# Patient Record
Sex: Male | Born: 1935 | Race: White | Hispanic: No | Smoking: Former smoker
Health system: Southern US, Community
[De-identification: ages and names within clinical notes are randomized; demographics above are authoritative.]

## PROBLEM LIST (undated history)

## (undated) DIAGNOSIS — R059 Cough, unspecified: Secondary | ICD-10-CM

## (undated) DIAGNOSIS — I071 Rheumatic tricuspid insufficiency: Secondary | ICD-10-CM

## (undated) DIAGNOSIS — Z87898 Personal history of other specified conditions: Secondary | ICD-10-CM

## (undated) DIAGNOSIS — Z9289 Personal history of other medical treatment: Secondary | ICD-10-CM

## (undated) DIAGNOSIS — D649 Anemia, unspecified: Secondary | ICD-10-CM

## (undated) DIAGNOSIS — R05 Cough: Secondary | ICD-10-CM

## (undated) DIAGNOSIS — I351 Nonrheumatic aortic (valve) insufficiency: Secondary | ICD-10-CM

## (undated) DIAGNOSIS — M1990 Unspecified osteoarthritis, unspecified site: Secondary | ICD-10-CM

## (undated) DIAGNOSIS — G14 Postpolio syndrome: Secondary | ICD-10-CM

## (undated) DIAGNOSIS — K4090 Unilateral inguinal hernia, without obstruction or gangrene, not specified as recurrent: Secondary | ICD-10-CM

## (undated) DIAGNOSIS — N189 Chronic kidney disease, unspecified: Secondary | ICD-10-CM

## (undated) DIAGNOSIS — J189 Pneumonia, unspecified organism: Secondary | ICD-10-CM

## (undated) DIAGNOSIS — E785 Hyperlipidemia, unspecified: Secondary | ICD-10-CM

## (undated) DIAGNOSIS — I10 Essential (primary) hypertension: Secondary | ICD-10-CM

## (undated) DIAGNOSIS — M7989 Other specified soft tissue disorders: Secondary | ICD-10-CM

## (undated) DIAGNOSIS — R569 Unspecified convulsions: Secondary | ICD-10-CM

## (undated) DIAGNOSIS — Z8719 Personal history of other diseases of the digestive system: Secondary | ICD-10-CM

## (undated) DIAGNOSIS — I619 Nontraumatic intracerebral hemorrhage, unspecified: Secondary | ICD-10-CM

## (undated) DIAGNOSIS — D699 Hemorrhagic condition, unspecified: Secondary | ICD-10-CM

## (undated) DIAGNOSIS — I340 Nonrheumatic mitral (valve) insufficiency: Secondary | ICD-10-CM

## (undated) DIAGNOSIS — R011 Cardiac murmur, unspecified: Secondary | ICD-10-CM

## (undated) HISTORY — DX: Nonrheumatic aortic (valve) insufficiency: I35.1

## (undated) HISTORY — DX: Personal history of other medical treatment: Z92.89

## (undated) HISTORY — DX: Essential (primary) hypertension: I10

## (undated) HISTORY — DX: Personal history of other specified conditions: Z87.898

## (undated) HISTORY — DX: Hyperlipidemia, unspecified: E78.5

## (undated) HISTORY — DX: Rheumatic tricuspid insufficiency: I07.1

## (undated) HISTORY — PX: DENTAL SURGERY: SHX609

## (undated) HISTORY — DX: Chronic kidney disease, unspecified: N18.9

## (undated) HISTORY — DX: Unspecified convulsions: R56.9

## (undated) HISTORY — DX: Nontraumatic intracerebral hemorrhage, unspecified: I61.9

## (undated) HISTORY — PX: OTHER SURGICAL HISTORY: SHX169

## (undated) HISTORY — DX: Nonrheumatic mitral (valve) insufficiency: I34.0

---

## 1999-12-27 ENCOUNTER — Encounter (INDEPENDENT_AMBULATORY_CARE_PROVIDER_SITE_OTHER): Payer: Self-pay | Admitting: *Deleted

## 1999-12-27 ENCOUNTER — Ambulatory Visit (HOSPITAL_COMMUNITY): Admission: RE | Admit: 1999-12-27 | Discharge: 1999-12-27 | Payer: Self-pay | Admitting: *Deleted

## 2003-02-14 ENCOUNTER — Ambulatory Visit (HOSPITAL_COMMUNITY): Admission: RE | Admit: 2003-02-14 | Discharge: 2003-02-14 | Payer: Self-pay | Admitting: *Deleted

## 2005-07-01 ENCOUNTER — Encounter: Admission: RE | Admit: 2005-07-01 | Discharge: 2005-07-01 | Payer: Self-pay | Admitting: Specialist

## 2008-02-29 DIAGNOSIS — Z9289 Personal history of other medical treatment: Secondary | ICD-10-CM

## 2008-02-29 HISTORY — DX: Personal history of other medical treatment: Z92.89

## 2008-09-17 ENCOUNTER — Encounter: Admission: RE | Admit: 2008-09-17 | Discharge: 2008-09-17 | Payer: Self-pay | Admitting: Endocrinology

## 2008-09-18 ENCOUNTER — Encounter: Admission: RE | Admit: 2008-09-18 | Discharge: 2008-09-18 | Payer: Self-pay | Admitting: Endocrinology

## 2010-02-28 DIAGNOSIS — Z9289 Personal history of other medical treatment: Secondary | ICD-10-CM

## 2010-02-28 HISTORY — DX: Personal history of other medical treatment: Z92.89

## 2010-10-13 ENCOUNTER — Emergency Department (HOSPITAL_COMMUNITY): Payer: Medicare Other

## 2010-10-13 ENCOUNTER — Emergency Department (HOSPITAL_COMMUNITY)
Admission: EM | Admit: 2010-10-13 | Discharge: 2010-10-13 | Disposition: A | Discharge status: Home / Self Care | Payer: Medicare Other | Attending: Emergency Medicine | Admitting: Emergency Medicine

## 2010-10-13 DIAGNOSIS — Z79899 Other long term (current) drug therapy: Secondary | ICD-10-CM | POA: Insufficient documentation

## 2010-10-13 DIAGNOSIS — S01502A Unspecified open wound of oral cavity, initial encounter: Secondary | ICD-10-CM | POA: Insufficient documentation

## 2010-10-13 DIAGNOSIS — W503XXA Accidental bite by another person, initial encounter: Secondary | ICD-10-CM | POA: Insufficient documentation

## 2010-10-13 DIAGNOSIS — Y92009 Unspecified place in unspecified non-institutional (private) residence as the place of occurrence of the external cause: Secondary | ICD-10-CM | POA: Insufficient documentation

## 2010-10-13 DIAGNOSIS — IMO0002 Reserved for concepts with insufficient information to code with codable children: Secondary | ICD-10-CM | POA: Insufficient documentation

## 2010-10-13 DIAGNOSIS — R296 Repeated falls: Secondary | ICD-10-CM | POA: Insufficient documentation

## 2010-10-13 DIAGNOSIS — S0100XA Unspecified open wound of scalp, initial encounter: Secondary | ICD-10-CM | POA: Insufficient documentation

## 2010-10-13 DIAGNOSIS — R4182 Altered mental status, unspecified: Secondary | ICD-10-CM | POA: Insufficient documentation

## 2010-10-13 DIAGNOSIS — E785 Hyperlipidemia, unspecified: Secondary | ICD-10-CM | POA: Insufficient documentation

## 2010-10-13 DIAGNOSIS — I10 Essential (primary) hypertension: Secondary | ICD-10-CM | POA: Insufficient documentation

## 2010-10-13 DIAGNOSIS — G40909 Epilepsy, unspecified, not intractable, without status epilepticus: Secondary | ICD-10-CM | POA: Insufficient documentation

## 2010-10-13 LAB — COMPREHENSIVE METABOLIC PANEL
Alkaline Phosphatase: 119 U/L — ABNORMAL HIGH (ref 39–117)
BUN: 21 mg/dL (ref 6–23)
GFR calc Af Amer: >60 mL/min (ref >60)
Potassium: 4.3 mEq/L (ref 3.5–5.1)
Sodium: 137 mEq/L (ref 135–145)

## 2010-10-13 LAB — URINALYSIS, ROUTINE W REFLEX MICROSCOPIC
Leukocytes, UA: NEGATIVE
pH: 5.5 (ref 5.0–8.0)

## 2010-10-13 LAB — ETHANOL: Alcohol, Ethyl (B): <11 mg/dL (ref 0–11)

## 2010-10-13 LAB — DIFFERENTIAL
Basophils Absolute: 0 10*3/uL (ref 0.0–0.1)
Eosinophils Absolute: 0.2 10*3/uL (ref 0.0–0.7)
Eosinophils Relative: 1 % (ref 0–5)
Lymphocytes Relative: 14 % (ref 12–46)
Neutro Abs: 11.5 10*3/uL — ABNORMAL HIGH (ref 1.7–7.7)
Neutrophils Relative %: 77 % (ref 43–77)

## 2010-10-13 LAB — CBC
HCT: 43.9 % (ref 39.0–52.0)
Hemoglobin: 15.6 g/dL (ref 13.0–17.0)
MCH: 30.1 pg (ref 26.0–34.0)
Platelets: 195 10*3/uL (ref 150–400)
RBC: 5.18 MIL/uL (ref 4.22–5.81)
WBC: 14.9 10*3/uL — ABNORMAL HIGH (ref 4.0–10.5)

## 2010-10-13 LAB — RAPID URINE DRUG SCREEN, HOSP PERFORMED
Amphetamines: NOT DETECTED
Benzodiazepines: NOT DETECTED
Cocaine: NOT DETECTED
Opiates: NOT DETECTED

## 2010-10-13 LAB — URINE MICROSCOPIC-ADD ON

## 2010-10-13 LAB — POCT I-STAT TROPONIN I

## 2012-05-29 ENCOUNTER — Encounter: Payer: Self-pay | Admitting: Diagnostic Neuroimaging

## 2012-05-29 ENCOUNTER — Ambulatory Visit (INDEPENDENT_AMBULATORY_CARE_PROVIDER_SITE_OTHER): Payer: Medicare Other | Admitting: Diagnostic Neuroimaging

## 2012-05-29 VITALS — BP 120/71 | HR 79 | Temp 98.1°F | Ht 67.0 in | Wt 164.0 lb

## 2012-05-29 DIAGNOSIS — G40909 Epilepsy, unspecified, not intractable, without status epilepticus: Secondary | ICD-10-CM

## 2012-05-29 MED ORDER — LEVETIRACETAM 1000 MG PO TABS: 1000.0000 mg | ORAL_TABLET | Freq: Two times a day (BID) | ORAL | Status: DC

## 2012-05-29 NOTE — Patient Instructions (Signed)
Continue antiseizure medications.

## 2012-05-29 NOTE — Progress Notes (Signed)
[facility]  PATIENT: Charles Randall. DOB: 03/09/1927  REFERRING CLINICIAN:  HISTORY FROM: patient REASON FOR VISIT: follow up   HISTORICAL  CHIEF COMPLAINT:  Chief Complaint  Patient presents with  . Seizures    HISTORY OF PRESENT ILLNESS:   UPDATE 05/29/12: Doing well. No seizures. Tolerating medications.  UPDATE 11/29/11:  Has not had any seizure episodes.  Tolerating LEV 1000mg  BID, tolerating well without mood changes or increased dizziness.  No complaints today.  He has been driving when he absolutely has no other way.    UPDATE 08/29/11:  He has had 2 seizures on 08/11/11.  Were similar to his previous seizures.  Denies bowel/bladder incontinence. Denies sleep changes, missed doses, fever or poor food/fluid  intake.  He was increased to 1000mg  BID, tolerating well without dizziness or mood changes.    UPDATE 10/18/10: Had another spell on 10/13/10.  Woke up normally, read paper.  Then around 9am, was found by girlfried, slumped against wall in bedroom, with blood from mouth.  Taken to ER by EMS.  He was somewhat combative and confused for 2-3 hours.   CT head and neck unremarkable.  Started on LEV 500mg  BID.  No further events.  Family reports poor sleep patterns, snoring and daytime sleepiness.  Sleeps 5 hrs per night, tossing and turning.  Able to fall asleep very easily.  UPDATE 06/30/10: No further events.  Last spell 03/15/10.  Not driving.  Feels good.  Testing reviewed. PRIOR HPI: 79 yo right-handed Caucasian male with a past medical history of HTN, hyperlipidemia, post-polio syndrome, vitamin D deficiency, osteoporosis with history of compression fracture, left inguinal hernia, and eczema who presents for evaluation for possible atypical seizures. He is accompanied today by his long-term girlfriend who helps provide details regarding these episodes.   Starting about 2-3 years ago and occurring about every 6 months, the patient has had spells where he will  "blank out" for about 20-30 minutes. He reports complete amnesia during the spells, but denies any other symptoms occurring during or after the episodes. Other than a possible "warning surge" in his head immediately prior to the onset of the episodes, he denies any symptoms preceding the episodes. He has a hard time describing what the "warning surge" is. His girlfriend has noticed that during the episodes he has a "blank stare," "his face gets really white," and he will ask many questions over and over such as: "Where are we?" and "What are we doing here?" She denies any other signs, including loss of postural control, loss of bowel or bladder continence, orofacial twitching, tongue biting, etc. Immediately following the episodes, the patient is back to his normal self.  08/2008 to 09/2008: MRI brain w/ and w/out, MRA w/out, carotid dopplers, EEG, and neurocognitive battery all unrevealing. 10-hour ambulatory EEG on 12/2009 which was negative.   Most recent episode: March 15, 2010 around 12:30 PM, and was different from previous four episodes. The girlfriend found him "slumped down" in a chair with his legs and arms "as stiff as a board" (arms flexed at elbow, legs flexed at knees, eyes closed). She was able to rouse him after about 3-4 minutes, and he reports being "as weak as water" afterward, as well as very tired, which led him to sleep for the rest of the day.   REVIEW OF SYSTEMS: Full 14 system review of systems performed and notable only for fatigue, swelling in legs, ringing in the ears, itching, easy bruising and bleeding,  joint pain, weakness, history of seizure, decreased energy.  ALLERGIES: No Known Allergies  HOME MEDICATIONS: No outpatient prescriptions prior to visit.   No facility-administered medications prior to visit.    PAST MEDICAL HISTORY: History reviewed. No pertinent past medical history.  PAST SURGICAL HISTORY: History reviewed. No pertinent past surgical  history.  FAMILY HISTORY: No family history on file.  SOCIAL HISTORY:  History   Social History  . Marital Status: Unknown    Spouse Name: N/A    Number of Children: 2  . Years of Education: BA [employer]   Occupational History  . Not on file.   Social History Main Topics  . Smoking status: Former Smoker -- 0.50 packs/day    Quit date: 03/01/1963  . Smokeless tobacco: Not on file  . Alcohol Use: No  . Drug Use: No  . Sexually Active: Not on file   Other Topics Concern  . Not on file   Social History Narrative   79 year old right-handed male who lives at home alone.  He is single (has long-term girlfriend) and has two children.    He drinks one cup caffeinated coffee daily.     PHYSICAL EXAM  Filed Vitals:   05/29/12 1455  BP: 120/71  Pulse: 79  Temp: 98.1 F (36.7 C)  TempSrc: Oral  Height: 5\' 7"  (1.702 m)  Weight: 164 lb (74.39 kg)   Body mass index is 25.68 kg/(m^2).  GENERAL EXAM: Patient is in no distress  CARDIOVASCULAR: Regular rate and rhythm, no murmurs, no carotid bruits  NEUROLOGIC: MENTAL STATUS: awake, alert, language fluent, comprehension intact, naming intact CRANIAL NERVE: no papilledema on fundoscopic exam, pupils equal and reactive to light, visual fields full to confrontation, extraocular muscles intact, no nystagmus, facial sensation and strength symmetric, uvula midline, shoulder shrug symmetric, tongue midline. MOTOR: normal bulk and tone, full strength in the BUE, BLE; EXCEPT BILATERAL FOOT DF 3/5.  SENSORY: normal and symmetric to light touch, pinprick, temperature, vibration and proprioception COORDINATION: finger-nose-finger, fine finger movements normal REFLEXES: BUE 2+, BLE ABSENT. GAIT/STATION: CANNOT WALK ON HEELS OR TOES (FOOT DROP RELATED TO POLIO SYNDROME)  DIAGNOSTIC DATA (LABS, IMAGING, TESTING) - I reviewed patient records, labs, notes, testing and imaging myself where available.  No results found for this basename: WBC,  HGB, HCT, MCV, PLT   No results found for this basename: na, k, cl, co2, glucose, bun, creatinine, calcium, prot, albumin, ast, alt, alkphos, bilitot, gfrnonaa, gfraa   No results found for this basename: CHOL, HDL, LDLCALC, LDLDIRECT, TRIG, CHOLHDL   No results found for this basename: HGBA1C   No results found for this basename: VITAMINB12   No results found for this basename: TSH   05/04/10 EEG - normal  05/11/10 MRI BRAIN - mild chronic small vessel ischemic disease.  ASSESSMENT AND PLAN  79 y.o. year old male  has no past medical history on file. here with seizure disorder. Stable. Continue current medications.   Suanne Marker, MD 05/29/2012, 3:19 PM Certified in Neurology, Neurophysiology and Neuroimaging  [facility] [address] [place], [place] [address] [phone]

## 2014-09-17 ENCOUNTER — Encounter: Payer: Self-pay | Admitting: *Deleted

## 2014-10-28 ENCOUNTER — Encounter: Payer: Self-pay | Admitting: Cardiovascular Disease

## 2015-03-03 ENCOUNTER — Telehealth: Payer: Self-pay | Admitting: Cardiovascular Disease

## 2015-03-03 NOTE — Telephone Encounter (Signed)
Received records from [facility] for appointment on 04/08/15 with Dr Sallyanne Kuster.  Records given to [facility] (medical records) for Dr Croitoru's schedule on 04/08/15. lp

## 2015-04-06 ENCOUNTER — Ambulatory Visit: Payer: Medicare Other | Admitting: Cardiovascular Disease

## 2015-04-07 DIAGNOSIS — G14 Postpolio syndrome: Secondary | ICD-10-CM | POA: Insufficient documentation

## 2015-04-07 DIAGNOSIS — R404 Transient alteration of awareness: Secondary | ICD-10-CM | POA: Insufficient documentation

## 2015-04-07 DIAGNOSIS — E782 Mixed hyperlipidemia: Secondary | ICD-10-CM | POA: Insufficient documentation

## 2015-04-07 DIAGNOSIS — M79645 Pain in left finger(s): Secondary | ICD-10-CM | POA: Insufficient documentation

## 2015-04-07 DIAGNOSIS — G40109 Localization-related (focal) (partial) symptomatic epilepsy and epileptic syndromes with simple partial seizures, not intractable, without status epilepticus: Secondary | ICD-10-CM | POA: Insufficient documentation

## 2015-04-07 DIAGNOSIS — L989 Disorder of the skin and subcutaneous tissue, unspecified: Secondary | ICD-10-CM | POA: Insufficient documentation

## 2015-04-07 DIAGNOSIS — R413 Other amnesia: Secondary | ICD-10-CM | POA: Insufficient documentation

## 2015-04-08 ENCOUNTER — Ambulatory Visit (INDEPENDENT_AMBULATORY_CARE_PROVIDER_SITE_OTHER): Payer: Medicare Other | Admitting: Cardiovascular Disease

## 2015-04-08 VITALS — BP 134/82 | HR 66 | Ht 67.0 in | Wt 150.0 lb

## 2015-04-08 DIAGNOSIS — J90 Pleural effusion, not elsewhere classified: Secondary | ICD-10-CM

## 2015-04-08 DIAGNOSIS — R079 Chest pain, unspecified: Secondary | ICD-10-CM

## 2015-04-08 DIAGNOSIS — I10 Essential (primary) hypertension: Secondary | ICD-10-CM

## 2015-04-08 DIAGNOSIS — R600 Localized edema: Secondary | ICD-10-CM | POA: Diagnosis not present

## 2015-04-08 DIAGNOSIS — Z79899 Other long term (current) drug therapy: Secondary | ICD-10-CM | POA: Diagnosis not present

## 2015-04-08 DIAGNOSIS — J948 Other specified pleural conditions: Secondary | ICD-10-CM

## 2015-04-08 LAB — BASIC METABOLIC PANEL
BUN: 28 mg/dL — AB (ref 7–25)
CHLORIDE: 102 mmol/L (ref 98–110)
CO2: 27 mmol/L (ref 20–31)
CREATININE: 1.45 mg/dL — AB (ref 0.70–1.11)
Calcium: 9.3 mg/dL (ref 8.6–10.3)
Glucose, Bld: 78 mg/dL (ref 65–99)
Potassium: 5.1 mmol/L (ref 3.5–5.3)
Sodium: 137 mmol/L (ref 135–146)

## 2015-04-08 MED ORDER — LOSARTAN POTASSIUM 100 MG PO TABS: 100.0000 mg | ORAL_TABLET | Freq: Every day | ORAL | Status: DC

## 2015-04-08 NOTE — Patient Instructions (Addendum)
Your physician has requested that you have an echocardiogram. Echocardiography is a painless test that uses sound waves to create images of your heart. It provides your doctor with information about the size and shape of your heart and how well your heart's chambers and valves are working. This procedure takes approximately one hour. There are no restrictions for this procedure.  Your physician recommends that you return for lab work in: [place] has recommended you make the following change in your medication:   STOP AMLODIPINE  INCREASE  LOSARTAN TO 100 MG TAKE ONE TABLET DAILY  Dr. Sallyanne Kuster recommends that you schedule a follow-up appointment in: [place]

## 2015-04-09 ENCOUNTER — Encounter: Payer: Self-pay | Admitting: Cardiovascular Disease

## 2015-04-09 DIAGNOSIS — I10 Essential (primary) hypertension: Secondary | ICD-10-CM | POA: Insufficient documentation

## 2015-04-09 DIAGNOSIS — J90 Pleural effusion, not elsewhere classified: Secondary | ICD-10-CM | POA: Insufficient documentation

## 2015-04-09 DIAGNOSIS — R600 Localized edema: Secondary | ICD-10-CM | POA: Insufficient documentation

## 2015-04-09 LAB — BRAIN NATRIURETIC PEPTIDE: Brain Natriuretic Peptide: 47.5 pg/mL (ref <100)

## 2015-04-09 NOTE — Progress Notes (Signed)
Patient ID: Charles Randall., male   DOB: March 13, 1927, 82 y.o.   MRN: [id number]    Cardiology Office Note    Date:  04/09/2015   ID:  Charles Big., DOB 1927/03/25, MRN [id number]  PCP:  Lynne Logan, MD  Cardiologist:   Sanda Klein, MD   Chief Complaint  Patient presents with  . New Evaluation    referred by Dewayne Shorter, MD//pt c/o SOB on overexertion, loss of balance, swelling in bilateral legs/feet/ankles--more in left  . Seizures    last one was about 3 weeks ago--usually has them every 6-10 months    History of Present Illness:  Charles Happ. is a 82 y.o. male with a long-standing history of hypertension who I previously saw in April 2014 for evaluation of an episode of loss of consciousness that was identified eventually as being a seizure. He has long-standing systemic hypertension but not much in the way of structural heart disease. In 2014 his echocardiogram showed normal left ventricular systolic function and mild to moderate aortic insufficiency.  He returns with complaints of bilateral lower extremity edema that occurs intermittently. Although both ankles are both the left ankle is always a little more swollen. Also has substantial asymmetry of his lower extremities due to remote polio syndrome. There is no associated pain or discoloration. Says been going on for about a year off and on.  About one month ago he had right-sided pleuritic pain that lasted for about 2 weeks. The discomfort was clearly worsened by deep breathing and it caused splinting.  Associated cough productive of scanty amounts of mucus but he did not have fever, chills or hemoptysis. The chest x-ray reportedly showed a small pleural effusion. His images are not available for my review.  While he does describe some exertional shortness of breath this only occurs with fairly intense physical activity.   Past Medical History  Diagnosis Date  . H/O echocardiogram 2013    aortic valve  disorder, EF =>55%  . H/O echocardiogram 2012    AVD, EF =>55%  . H/O Doppler ultrasound 2010  . Seizures   . Aortic insufficiency     mild, moderate  . HTN (hypertension)   . Dyslipidemia   . Mitral regurgitation     mild  . Tricuspid regurgitation     mild  . H/O dizziness   . Lightheadedness   . Ankle edema     mild    Past Surgical History  Procedure Laterality Date  . None      Outpatient Prescriptions Prior to Visit  Medication Sig Dispense Refill  . Cholecalciferol (VITAMIN D-3) 1000 UNITS CAPS Take by mouth.    . levETIRAcetam (KEPPRA) 1000 MG tablet Take 1 tablet (1,000 mg total) by mouth 2 (two) times daily. 60 tablet 12  . MAGNESIUM-ZINC PO Take by mouth.    . triamterene-hydrochlorothiazide (MAXZIDE-25) 37.5-25 MG per tablet     . vitamin B-12 (CYANOCOBALAMIN) 1000 MCG tablet Take 1,000 mcg by mouth daily.    Marland Kitchen amLODipine (NORVASC) 5 MG tablet     . atorvastatin (LIPITOR) 10 MG tablet     . vitamin C (ASCORBIC ACID) 500 MG tablet Take 500 mg by mouth daily.     No facility-administered medications prior to visit.     Allergies:   Review of patient's allergies indicates no known allergies.   Social History   Social History  . Marital Status: Unknown    Spouse Name: N/A  . Number of  Children: 2  . Years of Education: BA [employer]   Social History Main Topics  . Smoking status: Former Smoker -- 0.50 packs/day    Quit date: 03/01/1963  . Smokeless tobacco: Not on file  . Alcohol Use: No  . Drug Use: No  . Sexual Activity: Not on file   Other Topics Concern  . Not on file   Social History Narrative   82 year old right-handed male who lives at home alone.  He is single (has long-term girlfriend) and has two children.    He drinks one cup caffeinated coffee daily.     Family History:  The patient's family history includes Cancer in his maternal grandfather, maternal grandmother, and mother; Heart disease in his father; Kidney failure in his sister.    ROS:   Please see the history of present illness.    ROS All other systems reviewed and are negative.   PHYSICAL EXAM:   VS:  BP 134/82 mmHg  Pulse 66  Ht 5\' 7"  (1.702 m)  Wt 68.04 kg (150 lb)  BMI 23.49 kg/m2   GEN: Well nourished, well developed, in no acute distress HEENT: normal Neck: no JVD, carotid bruits, or masses Cardiac: RRR; no murmurs, rubs, or gallops, bilateral ankle swelling 2+ left, 1+ right, and bilateral varicose veins Respiratory:  clear to auscultation bilaterally, normal work of breathing GI: soft, nontender, nondistended, + BS MS: no deformity or atrophy Skin: warm and dry, no rash Neuro:  Alert and Oriented x 3, Strength and sensation are intact Psych: euthymic mood, full affect  Wt Readings from Last 3 Encounters:  04/08/15 68.04 kg (150 lb)  05/29/12 74.39 kg (164 lb)      Studies/Labs Reviewed:   EKG:  EKG is ordered today.  The ekg ordered today demonstrates NSR. Repolarization abnormality are not seen    ASSESSMENT:    1. Chest pain, unspecified chest pain type   2. Pleural effusion, right   3. Bilateral edema of lower extremity   4. Essential hypertension   5. Medication management      PLAN:  In order of problems listed above:  1. Pleurisy: His chest pain was clearly pleuritic and he may have had pleuritis/pericarditis. Statistically this is most likely to have been a viral syndrome, although he did not have fever. Since he had some lower extremity edema at the time, venous thromboembolic disease/pulmonary infarction is considered. Will check d-dimer. I don't think his pleural effusion can be attributed to congestive heart failure since this would have been a painless phenomenon 2. Pleural effusion: We'll try to retrieve the chest x-ray report, hopefully even the images 3. Edema: I believe his lower extremity edema may simply be a reflection of amlodipine side effects. We'll discontinue the amlodipine. I think it is less likely that  it is a representation of congestive heart failure, but it is not unreasonable to repeat an echocardiogram. We will look for signs of right heart enlargement which would support pulmonary embolism, worsening of aortic insufficiency or new left ventricular dysfunction. 4. HTN: To compensate for stopping amlodipine will increase his dose of losartan  F/U after echo.   Medication Adjustments/Labs and Tests Ordered: Current medicines are reviewed at length with the patient today.  Concerns regarding medicines are outlined above.  Medication changes, Labs and Tests ordered today are listed in the Patient Instructions below. Patient Instructions  Your physician has requested that you have an echocardiogram. Echocardiography is a painless test that uses sound waves to  create images of your heart. It provides your doctor with information about the size and shape of your heart and how well your heart's chambers and valves are working. This procedure takes approximately one hour. There are no restrictions for this procedure.  Your physician recommends that you return for lab work in: [place] has recommended you make the following change in your medication:   STOP AMLODIPINE  INCREASE  LOSARTAN TO 100 MG TAKE ONE TABLET DAILY  Dr. Sallyanne Kuster recommends that you schedule a follow-up appointment in: ONE YEAR        SignedSanda Klein, MD  04/09/2015 6:05 PM    [address], [place], [place]  [address] Phone: [phone]; Fax: [phone]

## 2015-04-12 ENCOUNTER — Telehealth: Payer: Self-pay | Admitting: Physician Assistant

## 2015-04-12 LAB — D-DIMER, QUANTITATIVE: D-Dimer, Quant: 2.2 ug/mL-FEU — ABNORMAL HIGH (ref 0.00–0.48)

## 2015-04-12 NOTE — Telephone Encounter (Signed)
Patient seen by Dr. Sallyanne Kuster on 2/09 for pleuritic chest pain and lower extremity edema. Got paged by [facility] regarding elevated d-dimer of 2.2.   He will need outpatient CTA of chest and LE venous doppler to rule out DVT and PE. Discussed with Dr. Sallyanne Kuster who is on call this weekend and next week in the hospital. He recommend bring patient to short stay tomorrow for more IV hydration and recheck BMET before outpatient CTA of chest and possible LE venous doppler tomorrow. Note, his Cr was 1.45 whereas his baseline Cr 4 years ago was 1.0. Not sure if this represent AKI or CKD  I will be off for next 2 days, I will forward this message to card master and cardiology PA, Vin. If unable to arrange outpatient short stay before imaging tomorrow, then recommend outpatient hydration before setting out imaging.   I have called the patient, he denies any SOB at this time. There is no acute indication for admission in this case.  Hilbert Corrigan PA Pager: [phone]

## 2015-04-13 ENCOUNTER — Telehealth: Payer: Self-pay | Admitting: Cardiovascular Disease

## 2015-04-13 ENCOUNTER — Other Ambulatory Visit: Payer: Self-pay | Admitting: Physician Assistant

## 2015-04-13 ENCOUNTER — Telehealth: Payer: Self-pay | Admitting: *Deleted

## 2015-04-13 DIAGNOSIS — R609 Edema, unspecified: Secondary | ICD-10-CM

## 2015-04-13 DIAGNOSIS — R7989 Other specified abnormal findings of blood chemistry: Secondary | ICD-10-CM

## 2015-04-13 DIAGNOSIS — R600 Localized edema: Secondary | ICD-10-CM

## 2015-04-13 MED ORDER — SODIUM CHLORIDE 0.9 % IV SOLN: INTRAVENOUS | Status: DC

## 2015-04-13 NOTE — Telephone Encounter (Signed)
-----   Message from Sanda Klein, MD sent at 04/12/2015 10:00 AM EST ----- Almyra Deforest, PA scheduling outpatient CTA chest after hydration over the weekend, to exclude possible pulmonary embolism as cause of pleuritic pain and elevated D-dimer. Please also schedule for lower extremity venous Dopplers for edema.

## 2015-04-13 NOTE — Telephone Encounter (Signed)
Order placed for LE extremity venous doppler has been done by Leanor Kail, PA today.

## 2015-04-13 NOTE — Telephone Encounter (Signed)
No precert required.  Advised Vin.

## 2015-04-13 NOTE — Telephone Encounter (Signed)
CTA and LE venous doppler both arranged today at [facility] along with pre-hydration  (NS 167ml/hr) and BEMT check at short stay Michela Pitcher, RN aware). No prior authorization required.   Called patient multiple times since 9am this morning, however no reply. Left VM. Office will manage further. Likely set up as outpatient ?. I will again try to call the patient later today.

## 2015-04-13 NOTE — Telephone Encounter (Signed)
SPOKE TO VIN ,PA PATIENT NEEDS PRE-CERT/PRIOR AUT FOR CARDIAC CTA AND LOWER EXTREMITY VENOUS DOPPLER THE TEST ARE SCHEDULE FOR TODAY   INFORMED VIN,PA- WILL SEND INFORMATION TO PRE-CERT STAFF TO HANDLE PLEASE CONTACT VIN ,PA

## 2015-04-14 ENCOUNTER — Ambulatory Visit (HOSPITAL_COMMUNITY)
Admission: RE | Admit: 2015-04-14 | Discharge: 2015-04-14 | Disposition: A | Discharge status: Home / Self Care | Payer: Medicare Other | Source: Ambulatory Visit | Attending: Physician Assistant | Admitting: Physician Assistant

## 2015-04-14 DIAGNOSIS — I10 Essential (primary) hypertension: Secondary | ICD-10-CM | POA: Diagnosis not present

## 2015-04-14 DIAGNOSIS — Z87891 Personal history of nicotine dependence: Secondary | ICD-10-CM | POA: Diagnosis not present

## 2015-04-14 DIAGNOSIS — R609 Edema, unspecified: Secondary | ICD-10-CM | POA: Diagnosis present

## 2015-04-15 ENCOUNTER — Telehealth: Payer: Self-pay | Admitting: Cardiovascular Disease

## 2015-04-15 NOTE — Telephone Encounter (Signed)
Pt is calling back to get about getting some test scheduled. Please f/u with him   Thanks

## 2015-04-15 NOTE — Telephone Encounter (Signed)
Pt aware of upcoming echocardiogram.  Had questions about whether test was needed. Also wanted results from yesterday's DVT r/o exam. Results from LEV not visible in Epic, but pt was informed yesterday by sonographer that no DVT was detected. Checked w/ [employer] who reviewed results -- test was negative & pending scan.  Pt had questions regarding upcoming echo and rationale for this -- the purpose of this test was explained. He is aware also that his last echo was ~4 years ago.

## 2015-04-16 ENCOUNTER — Ambulatory Visit (INDEPENDENT_AMBULATORY_CARE_PROVIDER_SITE_OTHER)
Admission: RE | Admit: 2015-04-16 | Discharge: 2015-04-16 | Disposition: A | Discharge status: Home / Self Care | Payer: Medicare Other | Source: Ambulatory Visit | Attending: Physician Assistant | Admitting: Physician Assistant

## 2015-04-16 ENCOUNTER — Other Ambulatory Visit: Payer: Self-pay | Admitting: *Deleted

## 2015-04-16 DIAGNOSIS — R748 Abnormal levels of other serum enzymes: Secondary | ICD-10-CM

## 2015-04-16 DIAGNOSIS — R7989 Other specified abnormal findings of blood chemistry: Secondary | ICD-10-CM

## 2015-04-16 MED ORDER — IOHEXOL 350 MG/ML SOLN
65.0000 mL | Freq: Once | INTRAVENOUS | Status: AC | PRN
  Administered 2015-04-16: 65 mL via INTRAVENOUS

## 2015-04-17 ENCOUNTER — Telehealth: Payer: Self-pay | Admitting: *Deleted

## 2015-04-17 DIAGNOSIS — R911 Solitary pulmonary nodule: Secondary | ICD-10-CM

## 2015-04-17 DIAGNOSIS — I77810 Thoracic aortic ectasia: Secondary | ICD-10-CM

## 2015-04-17 NOTE — Progress Notes (Signed)
LM to call for CT results.

## 2015-04-17 NOTE — Progress Notes (Signed)
[facility] 04/17/15

## 2015-04-17 NOTE — Telephone Encounter (Signed)
LM for patient to call for CT chest results

## 2015-04-17 NOTE — Progress Notes (Signed)
LM for patient to call for CT results.  

## 2015-04-17 NOTE — Telephone Encounter (Signed)
-----   Message from Sanda Klein, MD sent at 04/16/2015  2:46 PM EST ----- No clots seen in the lung. The fluid in the lung base is now gone. Incidental findings are:  - mild enlargement of the ascending aorta (normal < 4 cm, his max diameter 4.8 cm, surgical repair recommended only when over 5.5-6.0 cm)  - tiny nodule (4 mm) in the right lung, probably not important in a patient that quit smoking 50 years ago  - compression fractures (sometimes a sign of osteoporosis) in thoracic vertebrae 4, 7 and 12 (these can cause chest pain radiating from the back) Leg scan also negative for clot.  No indication to take anticoagulants. The leg US showed reflux in the leg veins, which can explain the swelling. (will improve with compression stockings, cannot be treated with venous ablation since the deep veins are involved).  I would recommend a CT chest in 12 months to assess the ascending aorta and the right lung nodule. Would need to drink plenty of fluids and repeat the kidney tests before the repeat scan, but hopefully can avoid the need for IV fluids if kidney tests are better.

## 2015-04-22 ENCOUNTER — Ambulatory Visit (HOSPITAL_COMMUNITY): Payer: Medicare Other | Attending: Cardiology

## 2015-04-22 ENCOUNTER — Other Ambulatory Visit: Payer: Self-pay

## 2015-04-22 DIAGNOSIS — R079 Chest pain, unspecified: Secondary | ICD-10-CM | POA: Diagnosis present

## 2015-04-22 DIAGNOSIS — I340 Nonrheumatic mitral (valve) insufficiency: Secondary | ICD-10-CM | POA: Diagnosis not present

## 2015-04-22 DIAGNOSIS — I351 Nonrheumatic aortic (valve) insufficiency: Secondary | ICD-10-CM | POA: Diagnosis not present

## 2015-04-22 DIAGNOSIS — I77810 Thoracic aortic ectasia: Secondary | ICD-10-CM | POA: Diagnosis not present

## 2015-04-22 NOTE — Progress Notes (Unsigned)
Talked to Dr. Burt Knack about Echo before patient left.

## 2015-04-28 ENCOUNTER — Telehealth: Payer: Self-pay | Admitting: *Deleted

## 2015-04-28 DIAGNOSIS — I77819 Aortic ectasia, unspecified site: Secondary | ICD-10-CM

## 2015-04-28 DIAGNOSIS — I351 Nonrheumatic aortic (valve) insufficiency: Secondary | ICD-10-CM

## 2015-04-28 NOTE — Telephone Encounter (Signed)
Echo results called to patient.  Voiced understanding.  Order placed for repeat in one year.

## 2015-04-28 NOTE — Telephone Encounter (Signed)
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 There is aortic insufficiency related to the aortic dilation, at most moderate in severity. Neither the aortic enlargement nor the aortic valve la=eak ahs reached the degree at which there is need for treatment, but would reevaluate yearly to ensure lack of progression.

## 2015-05-11 ENCOUNTER — Ambulatory Visit: Payer: Self-pay | Admitting: Diagnostic Neuroimaging

## 2015-05-12 ENCOUNTER — Encounter: Payer: Self-pay | Admitting: Diagnostic Neuroimaging

## 2015-05-12 ENCOUNTER — Ambulatory Visit (INDEPENDENT_AMBULATORY_CARE_PROVIDER_SITE_OTHER): Payer: Medicare Other | Admitting: Diagnostic Neuroimaging

## 2015-05-12 VITALS — BP 113/64 | HR 71 | Ht 67.0 in | Wt 156.4 lb

## 2015-05-12 DIAGNOSIS — G40909 Epilepsy, unspecified, not intractable, without status epilepticus: Secondary | ICD-10-CM | POA: Insufficient documentation

## 2015-05-12 MED ORDER — LEVETIRACETAM 750 MG PO TABS: 1500.0000 mg | ORAL_TABLET | Freq: Two times a day (BID) | ORAL | Status: DC

## 2015-05-12 NOTE — Patient Instructions (Signed)
Thank you for coming to see [place] at [facility]. I hope we have been able to provide you high quality care today.  You may receive a patient satisfaction survey over the next few weeks. We would appreciate your feedback and comments so that we may continue to improve ourselves and the health of our patients.  - change levetiracetam to 1547m twice a day   ~~~~~~~~~~~~~~~~~~~~~~~~~~~~~~~~~~~~~~~~~~~~~~~~~~~~~~~~~~~~~~~~~  DR. Sugey Trevathan'S GUIDE TO HAPPY AND HEALTHY LIVING These are some of my general health and wellness recommendations. Some of them may apply to you better than others. Please use common sense as you try these suggestions and feel free to ask me any questions.   ACTIVITY/FITNESS Mental, social, emotional and physical stimulation are very important for brain and body health. Try learning a new activity (arts, music, language, sports, games).  Keep moving your body to the best of your abilities. You can do this at home, inside or outside, the park, community center, gym or anywhere you like. Consider a physical therapist or personal trainer to get started. Consider the app Sworkit. Fitness trackers such as smart-watches, smart-phones or Fitbits can help as well.   NUTRITION Eat more plants: colorful vegetables, nuts, seeds and berries.  Eat less sugar, salt, preservatives and processed foods.  Avoid toxins such as cigarettes and alcohol.  Drink water when you are thirsty. Warm water with a slice of lemon is an excellent morning drink to start the day.  Consider these websites for more information The Nutrition Source (hhttps://www.henry-hernandez.biz/ [employer] Nutrition (wWindowBlog.ch   RELAXATION Consider practicing mindfulness meditation or other relaxation techniques such as deep breathing, prayer, yoga, tai chi, massage. See website mindful.org or the apps Headspace or Calm to help get  started.   SLEEP Try to get at least 7-8+ hours sleep per day. Regular exercise and reduced caffeine will help you sleep better. Practice good sleep hygeine techniques. See website sleep.org for more information.   PLANNING Prepare estate planning, living will, healthcare POA documents. Sometimes this is best planned with the help of an attorney. Theconversationproject.org and agingwithdignity.org are excellent resources.

## 2015-05-12 NOTE — Progress Notes (Signed)
[facility]  PATIENT: Charles Randall. DOB: 1928/02/03  REFERRING CLINICIAN:  HISTORY FROM: patient and friend  REASON FOR VISIT: follow up   HISTORICAL  CHIEF COMPLAINT:  Chief Complaint  Patient presents with  . Seizures    rm 6, sig other -Tamela, "averaging 2 seizures a year since last seen; no injuries"  . Follow-up    last seen 05/2012    HISTORY OF PRESENT ILLNESS:   UPDATE 05/12/15: Since last visit, still having 1-2 seizures per year (staring spells; mouth automatisms) --> 2014 x 1; 2015 x 2; 2016 x 2. Still with interrupted sleep. Tolerating LEV 1000mg  BID.  UPDATE 05/29/12: Doing well. No seizures. Tolerating medications.  UPDATE 11/29/11: Has not had any seizure episodes. Tolerating LEV 1000mg  BID, tolerating well without mood changes or increased dizziness. No complaints today. He has been driving when he absolutely has no other way.   UPDATE 08/29/11: He has had 2 seizures on 08/11/11. Were similar to his previous seizures. Denies bowel/bladder incontinence. Denies sleep changes, missed doses, fever or poor food/fluid intake. He was increased to 1000mg  BID, tolerating well without dizziness or mood changes.   UPDATE 10/18/10: Had another spell on 10/13/10. Woke up normally, read paper. Then around 9am, was found by girlfried, slumped against wall in bedroom, with blood from mouth. Taken to ER by EMS. He was somewhat combative and confused for 2-3 hours. CT head and neck unremarkable. Started on LEV 500mg  BID. No further events. Family reports poor sleep patterns, snoring and daytime sleepiness. Sleeps 5 hrs per night, tossing and turning. Able to fall asleep very easily.  UPDATE 06/30/10: No further events. Last spell 03/15/10. Not driving. Feels good. Testing reviewed.  PRIOR HPI: 82 yo right-handed Caucasian male with a past medical history of HTN, hyperlipidemia, post-polio syndrome, vitamin D deficiency, osteoporosis with history  of compression fracture, left inguinal hernia, and eczema who presents for evaluation for possible atypical seizures. He is accompanied today by his long-term girlfriend who helps provide details regarding these episodes. Starting about 2-3 years ago and occurring about every 6 months, the patient has had spells where he will "blank out" for about 20-30 minutes. He reports complete amnesia during the spells, but denies any other symptoms occurring during or after the episodes. Other than a possible "warning surge" in his head immediately prior to the onset of the episodes, he denies any symptoms preceding the episodes. He has a hard time describing what the "warning surge" is. His girlfriend has noticed that during the episodes he has a "blank stare," "his face gets really white," and he will ask many questions over and over such as: "Where are we?" and "What are we doing here?" She denies any other signs, including loss of postural control, loss of bowel or bladder continence, orofacial twitching, tongue biting, etc. Immediately following the episodes, the patient is back to his normal self. 08/2008 to 09/2008: MRI brain w/ and w/out, MRA w/out, carotid dopplers, EEG, and neurocognitive battery all unrevealing. 10-hour ambulatory EEG on 12/2009 which was negative. Most recent episode: March 15, 2010 around 12:30 PM, and was different from previous four episodes. The girlfriend found him "slumped down" in a chair with his legs and arms "as stiff as a board" (arms flexed at elbow, legs flexed at knees, eyes closed). She was able to rouse him after about 3-4 minutes, and he reports being "as weak as water" afterward, as well as very tired, which led him to sleep for  the rest of the day.   REVIEW OF SYSTEMS: Full 14 system review of systems performed and negative with exception of: nothing.  ALLERGIES: No Known Allergies  HOME MEDICATIONS: Outpatient Prescriptions Prior to Visit  Medication Sig Dispense  Refill  . aspirin EC 81 MG tablet Take 81 mg by mouth daily.    . Cholecalciferol (VITAMIN D-3) 1000 UNITS CAPS Take by mouth.    . DHA-EPA-VITAMIN E PO Take 1,000 mg by mouth daily.    Marland Kitchen levETIRAcetam (KEPPRA) 1000 MG tablet Take 1 tablet (1,000 mg total) by mouth 2 (two) times daily. 60 tablet 12  . losartan (COZAAR) 100 MG tablet Take 1 tablet (100 mg total) by mouth daily. 90 tablet 3  . magnesium oxide (MAGOX 400) 400 (241.3 Mg) MG tablet Take 400 mg by mouth daily.    Marland Kitchen triamterene-hydrochlorothiazide (MAXZIDE-25) 37.5-25 MG per tablet     . vitamin B-12 (CYANOCOBALAMIN) 1000 MCG tablet Take 1,000 mcg by mouth daily.    . vitamin C (ASCORBIC ACID) 500 MG tablet Take 500 mg by mouth daily.    . furosemide (LASIX) 20 MG tablet Take 20 mg by mouth daily.    Marland Kitchen MAGNESIUM-ZINC PO Take by mouth.    . sodium chloride 0.9 % infusion Start 167ml/hour until the scan 500 mL 0   No facility-administered medications prior to visit.    PAST MEDICAL HISTORY: Past Medical History  Diagnosis Date  . H/O echocardiogram 2013    aortic valve disorder, EF =>55%  . H/O echocardiogram 2012    AVD, EF =>55%  . H/O Doppler ultrasound 2010  . Aortic insufficiency     mild, moderate  . HTN (hypertension)   . Dyslipidemia   . Mitral regurgitation     mild  . Tricuspid regurgitation     mild  . H/O dizziness   . Lightheadedness   . Ankle edema     mild  . Seizures ([place])     last sz 2016    PAST SURGICAL HISTORY: Past Surgical History  Procedure Laterality Date  . None      FAMILY HISTORY: Family History  Problem Relation Age of Onset  . Cancer Mother   . Heart disease Father   . Cancer Maternal Grandmother   . Cancer Maternal Grandfather   . Kidney failure Sister     SOCIAL HISTORY:  Social History   Social History  . Marital Status: Unknown    Spouse Name: N/A  . Number of Children: 2  . Years of Education: BA [employer]   Occupational History  . Not on file.   Social History  Main Topics  . Smoking status: Former Smoker -- 0.50 packs/day    Quit date: 03/01/1963  . Smokeless tobacco: Not on file  . Alcohol Use: No  . Drug Use: No  . Sexual Activity: Not on file   Other Topics Concern  . Not on file   Social History Narrative   82 year old right-handed male who lives at home alone.  He is single (has long-term girlfriend) and has two children.    He drinks one cup caffeinated coffee daily.     PHYSICAL EXAM  GENERAL EXAM/CONSTITUTIONAL: Vitals:  Filed Vitals:   05/12/15 1340  BP: 113/64  Pulse: 71  Height: 5\' 7"  (1.702 m)  Weight: 156 lb 6.4 oz (70.943 kg)     Body mass index is 24.49 kg/(m^2).  No exam data present  Patient is in no distress; well  developed, nourished and groomed; neck is supple  CARDIOVASCULAR:  Examination of carotid arteries is normal; no carotid bruits  Regular rate and rhythm, no murmurs  Examination of peripheral vascular system by observation and palpation is normal  EYES:  Ophthalmoscopic exam of optic discs and posterior segments is normal; no papilledema or hemorrhages  MUSCULOSKELETAL:  Gait, strength, tone, movements noted in Neurologic exam below  NEUROLOGIC: MENTAL STATUS:  No flowsheet data found.  awake, alert, oriented to person, place and time  recent and remote memory intact  normal attention and concentration  language fluent, comprehension intact, naming intact,   fund of knowledge appropriate  CRANIAL NERVE:   2nd - no papilledema on fundoscopic exam  2nd, 3rd, 4th, 6th - pupils equal and reactive to light, visual fields full to confrontation, extraocular muscles intact, no nystagmus  5th - facial sensation symmetric  7th - facial strength symmetric  8th - hearing intact  9th - palate elevates symmetrically, uvula midline  11th - shoulder shrug symmetric  12th - tongue protrusion midline  MOTOR:   normal bulk and tone, full strength in the BUE, BLE; EXCEPT FOR  BILATERAL FOOT DROP (3/5)  SENSORY:   normal and symmetric to light touch, temperature, vibration  COORDINATION:   finger-nose-finger, fine finger movements normal  REFLEXES:   deep tendon reflexes --> BUE 1; BLE 0  GAIT/STATION:   narrow based gait; CANNOT WALK ON HEELS OR TOES (FOOT DROP RELATED TO POLIO SYNDROME)    DIAGNOSTIC DATA (LABS, IMAGING, TESTING) - I reviewed patient records, labs, notes, testing and imaging myself where available.  Lab Results  Component Value Date   WBC 14.9* 10/13/2010   HGB 15.6 10/13/2010   HCT 43.9 10/13/2010   MCV 84.7 10/13/2010   PLT 195 10/13/2010      Component Value Date/Time   NA 137 04/08/2015 1216   K 5.1 04/08/2015 1216   CL 102 04/08/2015 1216   CO2 27 04/08/2015 1216   GLUCOSE 78 04/08/2015 1216   BUN 28* 04/08/2015 1216   CREATININE 1.45* 04/08/2015 1216   CREATININE 1.08 10/13/2010 1103   CALCIUM 9.3 04/08/2015 1216   PROT 7.6 10/13/2010 1103   ALBUMIN 3.9 10/13/2010 1103   AST 43* 10/13/2010 1103   ALT 23 10/13/2010 1103   ALKPHOS 119* 10/13/2010 1103   BILITOT 0.5 10/13/2010 1103   GFRNONAA >60 10/13/2010 1103   GFRAA >60 10/13/2010 1103   No results found for: CHOL, HDL, LDLCALC, LDLDIRECT, TRIG, CHOLHDL No results found for: HGBA1C No results found for: VITAMINB12 No results found for: TSH  05/04/10 EEG - normal  05/11/10 MRI BRAIN - mild chronic small vessel ischemic disease.    ASSESSMENT AND PLAN  82 y.o. 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 Last seizure was in 12/09/14.   Dx:  1. Seizure disorder ([place])      PLAN: - increase LEV to 1500mg  BID - no driving until seizure free x 6 months  Meds ordered this encounter  Medications  . levETIRAcetam (KEPPRA) 750 MG tablet    Sig: Take 2 tablets (1,500 mg total) by mouth 2 (two) times daily.    Dispense:  120 tablet    Refill:  12   Return in about 6  months (around 11/12/2015).    Penni Bombard, MD [id number], 0000000 PM Certified in Neurology, Neurophysiology and Neuroimaging  [facility] [address], [place], [place] [address] [phone]  

## 2015-10-13 DIAGNOSIS — D649 Anemia, unspecified: Secondary | ICD-10-CM | POA: Insufficient documentation

## 2015-11-16 ENCOUNTER — Ambulatory Visit (INDEPENDENT_AMBULATORY_CARE_PROVIDER_SITE_OTHER): Payer: Medicare Other | Admitting: Diagnostic Neuroimaging

## 2015-11-16 ENCOUNTER — Encounter: Payer: Self-pay | Admitting: Diagnostic Neuroimaging

## 2015-11-16 VITALS — BP 112/70 | HR 60 | Ht 67.0 in | Wt 152.0 lb

## 2015-11-16 DIAGNOSIS — G40909 Epilepsy, unspecified, not intractable, without status epilepticus: Secondary | ICD-10-CM

## 2015-11-16 DIAGNOSIS — G40209 Localization-related (focal) (partial) symptomatic epilepsy and epileptic syndromes with complex partial seizures, not intractable, without status epilepticus: Secondary | ICD-10-CM | POA: Insufficient documentation

## 2015-11-16 MED ORDER — LEVETIRACETAM 750 MG PO TABS: 1500.0000 mg | ORAL_TABLET | Freq: Two times a day (BID) | ORAL | 4 refills | Status: DC

## 2015-11-16 NOTE — Patient Instructions (Signed)

## 2015-11-16 NOTE — Progress Notes (Signed)
[facility]  PATIENT: Charles Randall. DOB: 1927/12/16  REFERRING CLINICIAN:  HISTORY FROM: patient and friend  REASON FOR VISIT: follow up   HISTORICAL  CHIEF COMPLAINT:  Chief Complaint  Patient presents with  . Rm 6  . Follow-up    Girlfriend, Tamela  . Seizures    Reports no seizure activity since last OV. Tolerating Keppra well and would like 90 day rx if possible.    HISTORY OF PRESENT ILLNESS:   UPDATE 11/16/15: Since last visit, doing well. No definite seizures. Tolerating LEV 1500mg  BID. Patient not driving, due to concern from his friend about poss of un-witnesseded seizures. No indication of this, but rather the theoretical possibility.   UPDATE 05/12/15: Since last visit, still having 1-2 seizures per year (staring spells; mouth automatisms) --> 2014 x 1; 2015 x 2; 2016 x 2. Still with interrupted sleep. Tolerating LEV 1000mg  BID.  UPDATE 05/29/12: Doing well. No seizures. Tolerating medications.  UPDATE 11/29/11: Has not had any seizure episodes. Tolerating LEV 1000mg  BID, tolerating well without mood changes or increased dizziness. No complaints today. He has been driving when he absolutely has no other way.   UPDATE 08/29/11: He has had 2 seizures on 08/11/11. Were similar to his previous seizures. Denies bowel/bladder incontinence. Denies sleep changes, missed doses, fever or poor food/fluid intake. He was increased to 1000mg  BID, tolerating well without dizziness or mood changes.   UPDATE 10/18/10: Had another spell on 10/13/10. Woke up normally, read paper. Then around 9am, was found by girlfried, slumped against wall in bedroom, with blood from mouth. Taken to ER by EMS. He was somewhat combative and confused for 2-3 hours. CT head and neck unremarkable. Started on LEV 500mg  BID. No further events. Family reports poor sleep patterns, snoring and daytime sleepiness. Sleeps 5 hrs per night, tossing and turning. Able to fall asleep  very easily.  UPDATE 06/30/10: No further events. Last spell 03/15/10. Not driving. Feels good. Testing reviewed.  PRIOR HPI: 82 yo right-handed Caucasian male with a past medical history of HTN, hyperlipidemia, post-polio syndrome, vitamin D deficiency, osteoporosis with history of compression fracture, left inguinal hernia, and eczema who presents for evaluation for possible atypical seizures. He is accompanied today by his long-term girlfriend who helps provide details regarding these episodes. Starting about 2-3 years ago and occurring about every 6 months, the patient has had spells where he will "blank out" for about 20-30 minutes. He reports complete amnesia during the spells, but denies any other symptoms occurring during or after the episodes. Other than a possible "warning surge" in his head immediately prior to the onset of the episodes, he denies any symptoms preceding the episodes. He has a hard time describing what the "warning surge" is. His girlfriend has noticed that during the episodes he has a "blank stare," "his face gets really white," and he will ask many questions over and over such as: "Where are we?" and "What are we doing here?" She denies any other signs, including loss of postural control, loss of bowel or bladder continence, orofacial twitching, tongue biting, etc. Immediately following the episodes, the patient is back to his normal self. 08/2008 to 09/2008: MRI brain w/ and w/out, MRA w/out, carotid dopplers, EEG, and neurocognitive battery all unrevealing. 10-hour ambulatory EEG on 12/2009 which was negative. Most recent episode: March 15, 2010 around 12:30 PM, and was different from previous four episodes. The girlfriend found him "slumped down" in a chair with his legs and arms "  as stiff as a board" (arms flexed at elbow, legs flexed at knees, eyes closed). She was able to rouse him after about 3-4 minutes, and he reports being "as weak as water" afterward, as well as very  tired, which led him to sleep for the rest of the day.   REVIEW OF SYSTEMS: Full 14 system review of systems performed and negative with exception of: dizziness leg swelling walking diff daytime sleepiness rash itching eczema weakness.  ALLERGIES: No Known Allergies  HOME MEDICATIONS: Outpatient Medications Prior to Visit  Medication Sig Dispense Refill  . aspirin EC 81 MG tablet Take 81 mg by mouth daily.    . Cholecalciferol (VITAMIN D-3) 1000 UNITS CAPS Take by mouth.    . levETIRAcetam (KEPPRA) 750 MG tablet Take 2 tablets (1,500 mg total) by mouth 2 (two) times daily. 120 tablet 12  . losartan (COZAAR) 100 MG tablet Take 1 tablet (100 mg total) by mouth daily. 90 tablet 3  . magnesium oxide (MAGOX 400) 400 (241.3 Mg) MG tablet Take 400 mg by mouth daily.    Marland Kitchen triamterene-hydrochlorothiazide (MAXZIDE-25) 37.5-25 MG per tablet     . vitamin B-12 (CYANOCOBALAMIN) 1000 MCG tablet Take 1,000 mcg by mouth daily.    . vitamin C (ASCORBIC ACID) 500 MG tablet Take 500 mg by mouth daily.    . vitamin E 400 UNIT capsule Take by mouth.    . DHA-EPA-VITAMIN E PO Take 1,000 mg by mouth daily.    Marland Kitchen OVER THE COUNTER MEDICATION Take 1,000 mg by mouth. Fish oil     No facility-administered medications prior to visit.     PAST MEDICAL HISTORY: Past Medical History:  Diagnosis Date  . Ankle edema    mild  . Aortic insufficiency    mild, moderate  . Dyslipidemia   . H/O dizziness   . H/O Doppler ultrasound 2010  . H/O echocardiogram 2013   aortic valve disorder, EF =>55%  . H/O echocardiogram 2012   AVD, EF =>55%  . HTN (hypertension)   . Lightheadedness   . Mitral regurgitation    mild  . Seizures ([place])    last sz 2016  . Tricuspid regurgitation    mild    PAST SURGICAL HISTORY: Past Surgical History:  Procedure Laterality Date  . none      FAMILY HISTORY: Family History  Problem Relation Age of Onset  . Cancer Mother   . Heart disease Father   . Cancer Maternal  Grandmother   . Cancer Maternal Grandfather   . Kidney failure Sister     SOCIAL HISTORY:  Social History   Social History  . Marital status: Unknown    Spouse name: N/A  . Number of children: 2  . Years of education: BA [employer]   Occupational History  . Retired    Social History Main Topics  . Smoking status: Former Smoker    Packs/day: 0.50    Quit date: 03/01/1963  . Smokeless tobacco: Never Used  . Alcohol use No  . Drug use: No  . Sexual activity: Not on file   Other Topics Concern  . Not on file   Social History Narrative   82 year old right-handed male who lives at home alone.  He is single (has long-term girlfriend) and has two children.    He drinks one cup caffeinated coffee daily.     PHYSICAL EXAM  GENERAL EXAM/CONSTITUTIONAL: Vitals:  Vitals:   11/16/15 0844  BP: 112/70  Pulse: 60  Weight: 152 lb (68.9 kg)  Height: 5\' 7"  (1.702 m)   Body mass index is 23.81 kg/m. No exam data present  Patient is in no distress; well developed, nourished and groomed; neck is supple  CARDIOVASCULAR:  Examination of carotid arteries is normal; no carotid bruits  Regular rate and rhythm, no murmurs  Examination of peripheral vascular system by observation and palpation is normal  EYES:  Ophthalmoscopic exam of optic discs and posterior segments is normal; no papilledema or hemorrhages  MUSCULOSKELETAL:  Gait, strength, tone, movements noted in Neurologic exam below  NEUROLOGIC: MENTAL STATUS:  No flowsheet data found.  awake, alert, oriented to person, place and time  recent and remote memory intact  normal attention and concentration  language fluent, comprehension intact, naming intact,   fund of knowledge appropriate  CRANIAL NERVE:   2nd - no papilledema on fundoscopic exam  2nd, 3rd, 4th, 6th - pupils equal and reactive to light, visual fields full to confrontation, extraocular muscles intact, no nystagmus  5th - facial sensation  symmetric  7th - facial strength symmetric  8th - hearing intact  9th - palate elevates symmetrically, uvula midline  11th - shoulder shrug symmetric  12th - tongue protrusion midline  MOTOR:   normal bulk and tone, full strength in the BUE, BLE; EXCEPT FOR BILATERAL FOOT DROP (3/5)  SENSORY:   normal and symmetric to light touch, temperature, vibration  COORDINATION:   finger-nose-finger, fine finger movements normal  REFLEXES:   deep tendon reflexes --> BUE 1; BLE 0  GAIT/STATION:   narrow based gait; CANNOT WALK ON HEELS OR TOES (FOOT DROP RELATED TO POLIO SYNDROME)    DIAGNOSTIC DATA (LABS, IMAGING, TESTING) - I reviewed patient records, labs, notes, testing and imaging myself where available.  Lab Results  Component Value Date   WBC 14.9 (H) 10/13/2010   HGB 15.6 10/13/2010   HCT 43.9 10/13/2010   MCV 84.7 10/13/2010   PLT 195 10/13/2010      Component Value Date/Time   NA 137 04/08/2015 1216   K 5.1 04/08/2015 1216   CL 102 04/08/2015 1216   CO2 27 04/08/2015 1216   GLUCOSE 78 04/08/2015 1216   BUN 28 (H) 04/08/2015 1216   CREATININE 1.45 (H) 04/08/2015 1216   CALCIUM 9.3 04/08/2015 1216   PROT 7.6 10/13/2010 1103   ALBUMIN 3.9 10/13/2010 1103   AST 43 (H) 10/13/2010 1103   ALT 23 10/13/2010 1103   ALKPHOS 119 (H) 10/13/2010 1103   BILITOT 0.5 10/13/2010 1103   GFRNONAA >60 10/13/2010 1103   GFRAA >60 10/13/2010 1103   No results found for: CHOL, HDL, LDLCALC, LDLDIRECT, TRIG, CHOLHDL No results found for: HGBA1C No results found for: VITAMINB12 No results found for: TSH  05/04/10 EEG - normal  05/11/10 MRI BRAIN - mild chronic small vessel ischemic disease.    ASSESSMENT AND PLAN  82 y.o. 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 Last seizure was in 12/09/14.   Dx:  1. Seizure disorder ([place])   2. Partial symptomatic epilepsy with complex partial  seizures, not intractable, without status epilepticus ([place])      PLAN: I spent 15 minutes of face to face time with patient. Greater than 50% of time was spent in counseling and coordination of care with patient. In summary we discussed:  - continue LEV 1500mg  twice a day - although no definite witnessed seizures, patient and friend are concerned about  possibility of un-witnessed seizures, and they both feel better safety if patient does not drive; I agree with plan  Meds ordered this encounter  Medications  . levETIRAcetam (KEPPRA) 750 MG tablet    Sig: Take 2 tablets (1,500 mg total) by mouth 2 (two) times daily.    Dispense:  360 tablet    Refill:  4   Return in about 1 year (around 11/15/2016).    Penni Bombard, MD [id number], [id number] AM Certified in Neurology, Neurophysiology and Neuroimaging  [facility] [address], [place], [place] [address] [phone]

## 2016-02-09 ENCOUNTER — Telehealth: Payer: Self-pay | Admitting: Cardiovascular Disease

## 2016-02-09 NOTE — Telephone Encounter (Signed)
New message       Pt c/o left side pain from neck to arm and he has fatigue.  Pt states it is better at night but gets worse during the day. Please advise

## 2016-02-09 NOTE — Telephone Encounter (Signed)
Spoke to patient. Patient states he pain down left side of  neck , shoulder to wrist. Occurs during the day mainly. Patient states he is able to sleep with issues.  Patient states he does not want to cough because it hurts.per patient no chest pain, no wheezing , no swelling noted.  RN suggested to contact primary. The symptoms do not sound cardiac in nature. Patient would like an appointment. Appointment schedule 02/11/16 at 3:30 with extender. Informed patient to go to ER if [facility]. PATIENT VOICED UNDERSTANDING.

## 2016-02-11 ENCOUNTER — Encounter: Payer: Self-pay | Admitting: Physician Assistant

## 2016-02-11 ENCOUNTER — Ambulatory Visit (INDEPENDENT_AMBULATORY_CARE_PROVIDER_SITE_OTHER): Payer: Medicare Other | Admitting: Physician Assistant

## 2016-02-11 ENCOUNTER — Encounter: Payer: Self-pay | Admitting: Cardiovascular Disease

## 2016-02-11 VITALS — BP 138/77 | HR 87 | Ht 67.0 in | Wt 148.8 lb

## 2016-02-11 DIAGNOSIS — I729 Aneurysm of unspecified site: Secondary | ICD-10-CM | POA: Diagnosis not present

## 2016-02-11 DIAGNOSIS — R0781 Pleurodynia: Secondary | ICD-10-CM | POA: Diagnosis not present

## 2016-02-11 DIAGNOSIS — I359 Nonrheumatic aortic valve disorder, unspecified: Secondary | ICD-10-CM

## 2016-02-11 DIAGNOSIS — N183 Chronic kidney disease, stage 3 unspecified: Secondary | ICD-10-CM

## 2016-02-11 MED ORDER — KETOROLAC TROMETHAMINE 10 MG PO TABS: 10.0000 mg | ORAL_TABLET | Freq: Four times a day (QID) | ORAL | 0 refills | Status: DC

## 2016-02-11 MED ORDER — KETOROLAC TROMETHAMINE 10 MG PO TABS: ORAL_TABLET | ORAL | 0 refills | Status: DC

## 2016-02-11 NOTE — Progress Notes (Signed)
NSAID + losartan + contrast + CKD could be a recipe for renal toxicity. Please make sure both losartan and toradol are stopped for his  CT scan. Thank you MCr

## 2016-02-11 NOTE — Progress Notes (Signed)
Cardiology Office Note   Date:  02/11/2016   ID:  Charles Big., DOB 12/01/1927, MRN [id number]  PCP:  Charolette Forward, PA-C  Cardiologist:  Dr Sallyanne Kuster 04/09/2015 Rosaria Ferries, PA-C   Chief Complaint  Patient presents with  . Follow-up    neck pain, rib cage pain    History of Present Illness: Charles Randall. is a 82 y.o. male with a history of ascending Aorta 4.8 cm, AI, HTN, TR/MR, seizures  Pt called w/ L arm and shoulder pain, told sx atypical, cards appt made  Charles Big. presents for cardiology evaluation. He is here today with his girlfriend.  He got a runny nose, no other symptoms. Only drainage was clear fluid.   A week ago, he developed pain in the L neck that went into his L shoulder. It was mild at first, but gradually got worse. His L arm hurt to the point that he had trouble sleeping. The pain would decrease overnight, be at its lowest in the morning. He had a decrease in his pain if he slept on his back. There is no history of    It was as low as 1/10 in the morning. Over the last 2 days or so, it got worse. It would hurt acutely if he coughed or cleared his throat. Worse with deep inspiration. No improvement with leaning forward. No rx tried, he takes ASA 81 mg qhs, but did not take any other NSAIDS. It is sharp and quick at times, but dull the rest of the time. The pain dropped into his chest and he will get discomfort in the L lateral chest with cough or deep inspiration.   He has also had trouble swallowing. The fish oil pill is his largest pill, he has trouble swallowing them. He has to chew his food better and take smaller bites. Occasionally has to spit out food. No GERD sx. No tobacco in >40 years. A small amount of ETOH use.    Past Medical History:  Diagnosis Date  . Ankle edema    mild  . Aortic insufficiency    mild, moderate  . Dyslipidemia   . H/O dizziness   . H/O Doppler ultrasound 2010  . H/O echocardiogram 2013   aortic valve disorder, EF =>55%  . H/O echocardiogram 2012   AVD, EF =>55%  . HTN (hypertension)   . Lightheadedness   . Mitral regurgitation    mild  . Seizures ([place])    last sz 2016  . Tricuspid regurgitation    mild    Past Surgical History:  Procedure Laterality Date  . none      Current Outpatient Prescriptions  Medication Sig Dispense Refill  . aspirin EC 81 MG tablet Take 81 mg by mouth daily.    . Cholecalciferol (VITAMIN D-3) 1000 UNITS CAPS Take by mouth.    . levETIRAcetam (KEPPRA) 750 MG tablet Take 2 tablets (1,500 mg total) by mouth 2 (two) times daily. 360 tablet 4  . losartan (COZAAR) 100 MG tablet Take 1 tablet (100 mg total) by mouth daily. 90 tablet 3  . magnesium oxide (MAGOX 400) 400 (241.3 Mg) MG tablet Take 400 mg by mouth daily.    . Omega-3 Fatty Acids (FISH OIL) 1000 MG CAPS Take by mouth daily.    Marland Kitchen triamterene-hydrochlorothiazide (MAXZIDE-25) 37.5-25 MG per tablet     . vitamin B-12 (CYANOCOBALAMIN) 1000 MCG tablet Take 1,000 mcg by mouth daily.    Marland Kitchen  vitamin C (ASCORBIC ACID) 500 MG tablet Take 500 mg by mouth daily.    . vitamin E 400 UNIT capsule Take by mouth.     No current facility-administered medications for this visit.     Allergies:   Patient has no known allergies.    Social History:  The patient  reports that he quit smoking about 52 years ago. He smoked 0.50 packs per day. He has never used smokeless tobacco. He reports that he does not drink alcohol or use drugs.   Family History:  The patient's family history includes Cancer in his maternal grandfather, maternal grandmother, and mother; Heart disease in his father; Kidney failure in his sister.    ROS:  Please see the history of present illness. All other systems are reviewed and negative.    PHYSICAL EXAM: VS:  BP 138/77   Pulse 87   Ht 5\' 7"  (1.702 m)   Wt 148 lb 12.8 oz (67.5 kg)   BMI 23.31 kg/m  , BMI Body mass index is 23.31 kg/m. GEN: Well nourished, well developed,  male in no acute distress  HEENT: normal for age  Neck: no JVD, no carotid bruit, no masses Cardiac: RRR; soft diastolic murmur, no rubs, or gallops Respiratory: rales R>L, normal work of breathing; splinting respirations GI: soft, nontender, nondistended, + BS MS: no deformity or atrophy; no edema; distal pulses are 2+ in all 4 extremities   Skin: warm and dry, no rash Neuro:  Strength and sensation are intact Psych: euthymic mood, full affect   EKG:  EKG is ordered today. The ekg ordered today demonstrates SR, HR 87, ?LVH   ECHO: 04/22/2015 - Left ventricle: The cavity size was normal. Systolic function was   normal. The estimated ejection fraction was in the range of 60%   to 65%. Wall motion was normal; there were no regional wall   motion abnormalities. Doppler parameters are consistent with   abnormal left ventricular relaxation (grade 1 diastolic dysfunction). - Aortic valve: Moderately calcified annulus. Trileaflet;   moderately thickened, moderately calcified leaflets. Valve   mobility was restricted. There was moderate to severe regurgitation. - Aorta: Aortic root dimension: 44 mm (ED). - Ascending aorta: The ascending aorta was moderately dilated. - Mitral valve: There was moderate regurgitation. Impressions: - When compared to prior echocardiogram, aortic root has incresaed   in size (prior 3.9cm) Aortic regurgitation has increased as well.   Recent Labs: 04/08/2015: Brain Natriuretic Peptide 47.5; BUN 28; Creat 1.45; Potassium 5.1; Sodium 137    Lipid Panel No results found for: CHOL, TRIG, HDL, CHOLHDL, VLDL, LDLCALC, LDLDIRECT   Wt Readings from Last 3 Encounters:  02/11/16 148 lb 12.8 oz (67.5 kg)  11/16/15 152 lb (68.9 kg)  05/12/15 156 lb 6.4 oz (70.9 kg)     Other studies Reviewed: Additional studies/ records that were reviewed today include: office notes and previous testing.  ASSESSMENT AND PLAN:  1.  Pleuritic chest pain: His pain is much  worse with sitting upright, better if he has support behind his back. He does not have any tender areas, no history of trauma.   We will try Toradol, 5 day course. He has renal insufficiency, so will decrease the dose by half.   He has rales and has been coughing some, will check a CXR.   Because of his aneurysm, we will also go ahead and order a CT scan.   2. AI, Ascending Aortic aneurysm: with his chest pain, and decreased voltage in  the precordial leads, go ahead and get an echo (it was to be done yearly in 03/2016).  3. CKD III: BUN/Cr were 28/1.45 04/2015, recheck prior to CT scan.  4. HTN: BP up a little today, may be partly due to pain. He is on Losartan 100 mg qd, if CKD is worse, discuss a change w/ MD.   Current medicines are reviewed at length with the patient today.  The patient does not have concerns regarding medicines.  The following changes have been made:  Add Toradol  Labs/ tests ordered today include:   Orders Placed This Encounter  Procedures  . CT ANGIO CHEST AORTA W &/OR WO CONTRAST  . DG Chest 2 View  . EKG 12-Lead  . ECHOCARDIOGRAM COMPLETE    Disposition:   FU with Dr Sallyanne Kuster  Signed, Rosaria Ferries, PA-C  02/11/2016 4:55 PM    [facility] Phone: [phone]; Fax: [phone]  This note was written with the assistance of speech recognition software. Please excuse any transcriptional errors.

## 2016-02-11 NOTE — Patient Instructions (Addendum)
Medication Instructions:  START Toradol 10mg  Take 1 tablet 2 times a day for 5 days , IF YOU EXPERIENCE ANY SYMPTOMS SUCH AS UPSET STOMACH CALL THE OFFICE IMMEDIATELY   Labwork: Your physician recommends that you return for lab work in: TODAY-BMET  Testing/Procedures: Your physician has requested that you have an echocardiogram. Echocardiography is a painless test that uses sound waves to create images of your heart. It provides your doctor with information about the size and shape of your heart and how well your heart's chambers and valves are working. This procedure takes approximately one hour. There are no restrictions for this procedure. [address]  A chest x-ray takes a picture of the organs and structures inside the chest, including the heart, lungs, and blood vessels. This test can show several things, including, whether the heart is enlarges; whether fluid is building up in the lungs; and whether pacemaker / defibrillator leads are still in place----SCRIPT GIVEN FOR PATIENT TO HAVE CXR AT [facility] [id number]  [place]: Your physician recommends that you schedule a follow-up appointment in: [facility] 2018 WITH DR CROITORU  Any Other Special Instructions Will Be Listed Below (If Applicable).   If you need a refill on your cardiac medications before your next appointment, please call your pharmacy.

## 2016-02-12 ENCOUNTER — Telehealth: Payer: Self-pay | Admitting: *Deleted

## 2016-02-12 NOTE — Telephone Encounter (Signed)
This gentleman called in, stating he was seen at urgent care on [address] "the one across from Mrs. [facility]inner's" Had CXR and other tests done there & wanted to make sure we got copies.  I had called [facility] and they did not have record of patient. [place] and spoke to someone in Medical Records who will be sending results for review to [facility] clinical fax.

## 2016-02-12 NOTE — Telephone Encounter (Signed)
Got fax from [place] showing CXR w lower lobe infiltrate and CBC w elevated white count.  Rhonda reviewed and discussed w PA there who recommended return f/u asap, further assessment for patient for consideraiton of antibiotics, etc  Recommendations communicated to patient, who voiced understanding and thanks. Will return to [facility] for 2nd eval tonight.  Aware to call back to [place] if any further assessment or guidance needed from [place].

## 2016-02-15 ENCOUNTER — Telehealth: Payer: Self-pay | Admitting: Physician Assistant

## 2016-02-15 DIAGNOSIS — Z79899 Other long term (current) drug therapy: Secondary | ICD-10-CM

## 2016-02-15 NOTE — Telephone Encounter (Signed)
LM for patient that he will need BMET tomorrow in order to have CT on Wednesday. STAT BMET ordered.

## 2016-02-16 ENCOUNTER — Other Ambulatory Visit: Payer: Medicare Other | Admitting: *Deleted

## 2016-02-16 DIAGNOSIS — R7989 Other specified abnormal findings of blood chemistry: Secondary | ICD-10-CM

## 2016-02-16 LAB — BASIC METABOLIC PANEL
BUN: 58 mg/dL — AB (ref 7–25)
CHLORIDE: 99 mmol/L (ref 98–110)
CO2: 24 mmol/L (ref 20–31)
Calcium: 8.9 mg/dL (ref 8.6–10.3)
Creat: 2.11 mg/dL — ABNORMAL HIGH (ref 0.70–1.11)
Glucose, Bld: 96 mg/dL (ref 65–99)
POTASSIUM: 4.5 mmol/L (ref 3.5–5.3)
Sodium: 136 mmol/L (ref 135–146)

## 2016-02-16 NOTE — Addendum Note (Signed)
Addended by: Eulis Foster on: 02/16/2016 11:44 AM   Modules accepted: Orders

## 2016-02-17 ENCOUNTER — Inpatient Hospital Stay: Admission: RE | Admit: 2016-02-17 | Payer: Medicare Other | Source: Ambulatory Visit

## 2016-02-17 ENCOUNTER — Telehealth: Payer: Self-pay | Admitting: Physician Assistant

## 2016-02-17 NOTE — Telephone Encounter (Signed)
Called patient to notify him that Maxzide and Losartan 100mg  QD would need to be discontinued d/t recent elevated BUN/creatinine. Also scheduled for PA OV, per [place], [place] note he needs sooner appt and no openings with Dr. Loletha Grayer until end of Jan 2018

## 2016-02-19 ENCOUNTER — Telehealth: Payer: Self-pay | Admitting: Physician Assistant

## 2016-02-25 ENCOUNTER — Ambulatory Visit: Payer: Medicare Other | Admitting: Student

## 2016-03-04 ENCOUNTER — Other Ambulatory Visit: Payer: Self-pay

## 2016-03-04 ENCOUNTER — Ambulatory Visit (HOSPITAL_COMMUNITY): Payer: Medicare Other | Attending: Cardiovascular Disease

## 2016-03-04 DIAGNOSIS — R0781 Pleurodynia: Secondary | ICD-10-CM | POA: Diagnosis present

## 2016-03-04 DIAGNOSIS — I729 Aneurysm of unspecified site: Secondary | ICD-10-CM | POA: Diagnosis not present

## 2016-03-04 DIAGNOSIS — I340 Nonrheumatic mitral (valve) insufficiency: Secondary | ICD-10-CM | POA: Diagnosis not present

## 2016-03-04 DIAGNOSIS — I359 Nonrheumatic aortic valve disorder, unspecified: Secondary | ICD-10-CM | POA: Diagnosis not present

## 2016-03-21 ENCOUNTER — Encounter: Payer: Self-pay | Admitting: Surgery

## 2016-03-21 ENCOUNTER — Encounter: Payer: Self-pay | Admitting: Cardiovascular Disease

## 2016-03-21 ENCOUNTER — Ambulatory Visit: Payer: Self-pay | Admitting: Surgery

## 2016-03-21 NOTE — H&P (Signed)
Charles Randall 03/21/2016 10:27 AM Location: [facility] Patient #: [id number] DOB: 22-Oct-1927 Single / Language: Cleophus Molt / Race: White Male  Patient Care Team: Dewayne Shorter, PA-C as PCP - General (Physician Assistant) Sanda Klein, MD as Consulting Physician (Cardiology) Michael Boston, MD as Consulting Physician (General Surgery) Nolon Lennert, MD as Referring Physician (Orthopedic Surgery)  History of Present Illness Adin Hector MD; 03/21/2016 11:26 AM) Patient words: New-LIH.  The patient is a 83 year old male who presents with an inguinal hernia. Note for "Inguinal hernia": Patient sent for surgical evaluation at the request of Dr. Stann Mainland. Concern for left inguinal hernia.  Pleasant elderly but active male. Comes today with his wife. They swelling and discomfort in his left groin. He thinks it's been only a month. His wife thinks it's been much longer. Regardless, it is gotten larger and become more uncomfortable and painful. He has a fair amount of [place] is used to doing a lot of yardwork and mowing. Was concerned that he is feeling pain even running errands and some discomfort. Struggle with constipation that he is trying to help correct. Stool softeners didn't work but MiraLAX help. His have a post polio syndrome returns to walk slowly but he can get around [place] relatively well. She keeps on his balance. No problems with urination or defecation. No cardiac issues. No lung she is. Has not smoked cigarettes in over 50 years. Episode of chest pain. His cardiologist was concerned. Workup proved that he ate a pneumonia. Resolved after 10 days of antibiotics. Had an echocardiogram done in December which showed an ejection fraction around 60%. We'll is not ecstatic to have surgery, he does feel like the groin is rather bothering him and he wishes to get that fixed if possible.   Past Surgical History Malachi Bonds, CMA; 03/21/2016 10:27 AM) No  pertinent past surgical history  Allergies Malachi Bonds, CMA; 03/21/2016 10:28 AM) No Known Drug Allergies 03/21/2016  Medication History (Malachi Bonds, CMA; 03/21/2016 10:29 AM) LevETIRAcetam (750MG  Tablet, Oral) Active. Aspirin (81MG  Tablet, Oral) Active. Vitamin D3 (1000UNIT Tablet, Oral) Active. Magnesium Oxide (400MG  Tablet, Oral) Active. Fish Oil (1000MG  Capsule, Oral) Active. Vitamin B12 (1000MCG Tablet ER, Oral) Active. Vitamin C (500MG  Tablet, Oral) Active. Vitamin E (400UNIT Capsule, Oral) Active. Medications Reconciled  Social History Malachi Bonds, CMA; 03/21/2016 10:27 AM) Alcohol use Occasional alcohol use. Caffeine use Coffee. Tobacco use Former smoker.  Family History Malachi Bonds, CMA; 03/21/2016 10:27 AM) Cervical Cancer Mother. Heart Disease Father.  Other Problems Malachi Bonds, CMA; 03/21/2016 10:27 AM) Seizure Disorder     Review of Systems Malachi Bonds CMA; 03/21/2016 10:27 AM) General Present- Fatigue. Not Present- Appetite Loss, Chills, Fever, Night Sweats, Weight Gain and Weight Loss. Skin Present- Dryness and Non-Healing Wounds. Not Present- Change in Wart/Mole, Hives, Jaundice, New Lesions, Rash and Ulcer. HEENT Present- Ringing in the Ears, Sore Throat and Wears glasses/contact lenses. Not Present- Earache, Hearing Loss, Hoarseness, Nose Bleed, Oral Ulcers, Seasonal Allergies, Sinus Pain, Visual Disturbances and Yellow Eyes. Cardiovascular Present- Swelling of Extremities. Not Present- Chest Pain, Difficulty Breathing Lying Down, Leg Cramps, Palpitations, Rapid Heart Rate and Shortness of Breath. Gastrointestinal Present- Constipation and Difficulty Swallowing. Not Present- Abdominal Pain, Bloating, Bloody Stool, Change in Bowel Habits, Chronic diarrhea, Excessive gas, Gets full quickly at meals, Hemorrhoids, Indigestion, Nausea, Rectal Pain and Vomiting.  Vitals (Chemira Jones CMA; 03/21/2016 10:28 AM) 03/21/2016 10:27 AM Weight:  156.6 lb Height: 67in Body Surface Area: 1.82 m Body Mass Index: 24.53  kg/m  Temp.: 97.29F(Oral)  Pulse: 69 (Regular)  BP: 120/78 (Sitting, Left Arm, Standard)      Physical Exam Adin Hector MD; 03/21/2016 11:06 AM)  General Mental Status-Alert. General Appearance-Not in acute distress, Not Sickly. Orientation-Oriented X3. Hydration-Well hydrated. Voice-Normal.  Integumentary Global Assessment Upon inspection and palpation of skin surfaces of the - Axillae: non-tender, no inflammation or ulceration, no drainage. and Distribution of scalp and body hair is normal. General Characteristics Temperature - normal warmth is noted.  Head and Neck Head-normocephalic, atraumatic with no lesions or palpable masses. Face Global Assessment - atraumatic, no absence of expression. Neck Global Assessment - no abnormal movements, no bruit auscultated on the right, no bruit auscultated on the left, no decreased range of motion, non-tender. Trachea-midline. Thyroid Gland Characteristics - non-tender.  Eye Eyeball - Left-Extraocular movements intact, No Nystagmus. Eyeball - Right-Extraocular movements intact, No Nystagmus. Cornea - Left-No Hazy. Cornea - Right-No Hazy. Sclera/Conjunctiva - Left-No scleral icterus, No Discharge. Sclera/Conjunctiva - Right-No scleral icterus, No Discharge. Pupil - Left-Direct reaction to light normal. Pupil - Right-Direct reaction to light normal.  ENMT Ears Pinna - Left - no drainage observed, no generalized tenderness observed. Right - no drainage observed, no generalized tenderness observed. Nose and Sinuses External Inspection of the Nose - no destructive lesion observed. Inspection of the nares - Left - quiet respiration. Right - quiet respiration. Mouth and Throat Lips - Upper Lip - no fissures observed, no pallor noted. Lower Lip - no fissures observed, no pallor noted. Nasopharynx - no discharge  present. Oral Cavity/Oropharynx - Tongue - no dryness observed. Oral Mucosa - no cyanosis observed. Hypopharynx - no evidence of airway distress observed.  Chest and Lung Exam Inspection Movements - Normal and Symmetrical. Accessory muscles - No use of accessory muscles in breathing. Palpation Palpation of the chest reveals - Non-tender. Auscultation Breath sounds - Normal and Clear.  Cardiovascular Auscultation Rhythm - Regular. Murmurs & Other Heart Sounds - Auscultation of the heart reveals - No Murmurs and No Systolic Clicks.  Abdomen Inspection Inspection of the abdomen reveals - No Visible peristalsis and No Abnormal pulsations. Umbilicus - No Bleeding, No Urine drainage. Palpation/Percussion Palpation and Percussion of the abdomen reveal - Soft, Non Tender, No Rebound tenderness, No Rigidity (guarding) and No Cutaneous hyperesthesia. Note: Abdomen soft. Nontender, nondistended. No guarding. No diastasis. No umbilical nor other hernias  Male Genitourinary Sexual Maturity Tanner 5 - Adult hair pattern and Adult penile size and shape. Note: Obvious left groin bulge reducible consistent with left inguinal hernia. Subtle medial groin impulse suspicious for direct right inguinal hernia.  Normal external genitalia. Epididymi, testes, and spermatic cords normal without any masses.  Peripheral Vascular Upper Extremity Inspection - Left - No Cyanotic nailbeds, Not Ischemic. Right - No Cyanotic nailbeds, Not Ischemic.  Neurologic Neurologic evaluation reveals -normal attention span and ability to concentrate, able to name objects and repeat phrases. Appropriate fund of knowledge , normal sensation and normal coordination. Mental Status Affect - not angry, not paranoid. Cranial Nerves-Normal Bilaterally. Gait-Normal.  Neuropsychiatric Mental status exam performed with findings of-able to articulate well with normal speech/language, rate, volume and coherence, thought  content normal with ability to perform basic computations and apply abstract reasoning and no evidence of hallucinations, delusions, obsessions or homicidal/suicidal ideation.  Musculoskeletal Global Assessment Spine, Ribs and Pelvis - no instability, subluxation or laxity. Right Upper Extremity - no instability, subluxation or laxity.  Lymphatic Head & Neck  General Head & Neck Lymphatics: Bilateral - Description -  No Localized lymphadenopathy. Axillary  General Axillary Region: Bilateral - Description - No Localized lymphadenopathy. Femoral & Inguinal  Generalized Femoral & Inguinal Lymphatics: Left - Description - No Localized lymphadenopathy. Right - Description - No Localized lymphadenopathy.    Assessment & Plan Adin Hector MD; 03/21/2016 11:13 AM)  BILATERAL INGUINAL HERNIA WITHOUT OBSTRUCTION OR GANGRENE, RECURRENCE NOT SPECIFIED (K40.20) Impression: Obvious left groin and probable small right inguinal hernia. I think he would benefit from hernia repair.  His advanced age, he is relatively active. He does not like how it hurts and affects his ability to get around in his quality of life. They wish to be aggressive proceed.  His lungs his cardiologist, Dr. Tommie Raymond, agrees. Echocardiogram showed a good ejection fraction around 60%. He did have an episode of chest pain that turned out to be a pneumonia that resolved with antibiotics. No real edema right now. His operative risks are increased, but not prohibitive in my mind. We'll see what the rest of his doctors think.  Current Plans I recommended obtaining preoperative cardiac clearance. I am concerned about the health of the patient and the ability to tolerate the operation. Therefore, we will request clearance by cardiology to better assess operative risk & see if a reevaluation, further workup, etc is needed. Also recommendations on how medications such as for anticoagulation and blood pressure should be  managed/held/restarted after surgery. PREOP - ING HERNIA - ENCOUNTER FOR PREOPERATIVE EXAMINATION FOR GENERAL SURGICAL PROCEDURE (Z01.818)  Current Plans You are being scheduled for surgery- Our schedulers will call you.  You should hear from our office's scheduling department within 5 working days about the location, date, and time of surgery. We try to make accommodations for patient's preferences in scheduling surgery, but sometimes the OR schedule or the surgeon's schedule prevents [place] from making those accommodations.  If you have not heard from our office [phone]) in 5 working days, call the office and ask for your surgeon's nurse.  If you have other questions about your diagnosis, plan, or surgery, call the office and ask for your surgeon's nurse.  Written instructions provided The anatomy & physiology of the abdominal wall and pelvic floor was discussed. The pathophysiology of hernias in the inguinal and pelvic region was discussed. Natural history risks such as progressive enlargement, pain, incarceration, and strangulation was discussed. Contributors to complications such as smoking, obesity, diabetes, prior surgery, etc were discussed.  I feel the risks of no intervention will lead to serious problems that outweigh the operative risks; therefore, I recommended surgery to reduce and repair the hernia. I explained laparoscopic techniques with possible need for an open approach. I noted usual use of mesh to patch and/or buttress hernia repair  Risks such as bleeding, infection, abscess, need for further treatment, heart attack, death, and other risks were discussed. I noted a good likelihood this will help address the problem. Goals of post-operative recovery were discussed as well. Possibility that this will not correct all symptoms was explained. I stressed the importance of low-impact activity, aggressive pain control, avoiding constipation, & not pushing through pain  to minimize risk of post-operative chronic pain or injury. Possibility of reherniation was discussed. We will work to minimize complications.  An educational handout further explaining the pathology & treatment options was given as well. Questions were answered. The patient expresses understanding & wishes to proceed with surgery.  Pt Education - Pamphlet Given - Laparoscopic Hernia Repair: discussed with patient and provided information. Pt Education - [facility] Pain Control (  Labron Bloodgood) Pt Education - [facility] Hernia Post-Op HCI (Stavroula Rohde): discussed with patient and provided information. CHRONIC CONSTIPATION (K59.09)  Current Plans Pt Education - CCS Constipation (AT) Pt Education - [facility] Good Bowel Health (Zayven Powe)  Adin Hector, M.D., F.A.C.S. Gastrointestinal and Minimally Invasive Surgery [facility] [address] [place], [place] [address] [phone] Main / Paging

## 2016-03-25 ENCOUNTER — Telehealth: Payer: Self-pay

## 2016-03-25 NOTE — Telephone Encounter (Signed)
Request for surgical clearance:   1. What type surgery is being performed? Bilateral inguinal hernia repair  2. When is this surgery scheduled? pending  3. Are there any medications that need to be held prior to surgery and how long? N/A; pt takes ASA 81 mg QD  4. Name of the physician performing surgery: Dr Michael Boston  5. What is the office phone and fax number?   Phone [phone]  Fax [phone]

## 2016-03-25 NOTE — Telephone Encounter (Signed)
He has an appt Feb 9. Will wait on this until then. MCr

## 2016-03-31 ENCOUNTER — Other Ambulatory Visit: Payer: Self-pay | Admitting: Nephrology

## 2016-03-31 DIAGNOSIS — N181 Chronic kidney disease, stage 1: Secondary | ICD-10-CM

## 2016-04-05 ENCOUNTER — Ambulatory Visit
Admission: RE | Admit: 2016-04-05 | Discharge: 2016-04-05 | Disposition: A | Discharge status: Home / Self Care | Payer: Medicare Other | Source: Ambulatory Visit | Attending: Nephrology | Admitting: Nephrology

## 2016-04-05 DIAGNOSIS — N181 Chronic kidney disease, stage 1: Secondary | ICD-10-CM

## 2016-04-06 DIAGNOSIS — R1310 Dysphagia, unspecified: Secondary | ICD-10-CM | POA: Insufficient documentation

## 2016-04-08 ENCOUNTER — Encounter: Payer: Self-pay | Admitting: Cardiovascular Disease

## 2016-04-08 ENCOUNTER — Ambulatory Visit (INDEPENDENT_AMBULATORY_CARE_PROVIDER_SITE_OTHER): Payer: Medicare Other | Admitting: Cardiovascular Disease

## 2016-04-08 VITALS — BP 138/78 | HR 70 | Ht 67.0 in | Wt 150.8 lb

## 2016-04-08 DIAGNOSIS — I519 Heart disease, unspecified: Secondary | ICD-10-CM | POA: Diagnosis not present

## 2016-04-08 DIAGNOSIS — I7121 Aneurysm of the ascending aorta, without rupture: Secondary | ICD-10-CM

## 2016-04-08 DIAGNOSIS — N189 Chronic kidney disease, unspecified: Secondary | ICD-10-CM

## 2016-04-08 DIAGNOSIS — I712 Thoracic aortic aneurysm, without rupture: Secondary | ICD-10-CM

## 2016-04-08 DIAGNOSIS — I10 Essential (primary) hypertension: Secondary | ICD-10-CM | POA: Diagnosis not present

## 2016-04-08 DIAGNOSIS — Z01810 Encounter for preprocedural cardiovascular examination: Secondary | ICD-10-CM

## 2016-04-08 DIAGNOSIS — I5189 Other ill-defined heart diseases: Secondary | ICD-10-CM

## 2016-04-08 DIAGNOSIS — N289 Disorder of kidney and ureter, unspecified: Secondary | ICD-10-CM

## 2016-04-08 NOTE — Progress Notes (Signed)
Patient ID: Charles Elizalde., male   DOB: [birth year], 83 y.o.   MRN: [id number]    Cardiology Office Note    Date:  04/09/2016   ID:  Charles Big., DOB 1927/12/06, MRN [id number]  PCP:  Charolette Forward, PA-C  Cardiologist:   Sanda Klein, MD   Chief Complaint  Patient presents with  . Follow-up    History of Present Illness:  Charles Ouyang. is a 83 y.o. male with a long-standingsystemic hypertension, normal left ventricular systolic function and mild to moderate aortic insufficiency. He had syncope in the past, identified after workup to be due to seizure. He has substantial asymmetry of his lower extremities due to remote polio syndrome. He has become unsteady on his feet and is less eager to go outside. Over the summer though, he was mowing his lawn doing a third of it on 3 consecutive days. His leg problems slow him down more so than any breathing or cardiovascular problems. He has not had recurrent syncope or seizure in a long time. He has occasional ankle swelling that always resolves after overnight supine position. He has prominent varicose veins. We stopped his diuretic and angiotensin receptor blocker for worsening renal function after his visit in December. His creatinine was 1.45 in February 2017 and 2.11 in December.  He has left inguinal hernia and Dr Michael Boston has seen him to discuss surgical repair.   Past Medical History:  Diagnosis Date  . Ankle edema    mild  . Aortic insufficiency    mild, moderate  . Dyslipidemia   . H/O dizziness   . H/O Doppler ultrasound 2010  . H/O echocardiogram 2013   aortic valve disorder, EF =>55%  . H/O echocardiogram 2012   AVD, EF =>55%  . HTN (hypertension)   . Lightheadedness   . Mitral regurgitation    mild  . Seizures ([place])    last sz 2016  . Tricuspid regurgitation    mild    Past Surgical History:  Procedure Laterality Date  . none      Outpatient Medications Prior to Visit  Medication Sig Dispense  Refill  . aspirin EC 81 MG tablet Take 81 mg by mouth daily.    . Cholecalciferol (VITAMIN D-3) 1000 UNITS CAPS Take by mouth.    Marland Kitchen ketorolac (TORADOL) 10 MG tablet TAKE 1 TABLET BY MOUTH TWICE A DAY FOR 5 DAYS 10 tablet 0  . levETIRAcetam (KEPPRA) 750 MG tablet Take 2 tablets (1,500 mg total) by mouth 2 (two) times daily. 360 tablet 4  . magnesium oxide (MAGOX 400) 400 (241.3 Mg) MG tablet Take 400 mg by mouth daily.    . Omega-3 Fatty Acids (FISH OIL) 1000 MG CAPS Take by mouth daily.    . vitamin B-12 (CYANOCOBALAMIN) 1000 MCG tablet Take 1,000 mcg by mouth daily.    . vitamin C (ASCORBIC ACID) 500 MG tablet Take 500 mg by mouth daily.    . vitamin E 400 UNIT capsule Take by mouth.     No facility-administered medications prior to visit.      Allergies:   Patient has no known allergies.   Social History   Social History  . Marital status: Unknown    Spouse name: N/A  . Number of children: 2  . Years of education: BA [employer]   Occupational History  . Retired    Social History Main Topics  . Smoking status: Former Smoker    Packs/day: 0.50  Quit date: 03/01/1963  . Smokeless tobacco: Never Used  . Alcohol use No  . Drug use: No  . Sexual activity: Not Asked   Other Topics Concern  . None   Social History Narrative   83 year old right-handed male who lives at home alone.  He is single (has long-term girlfriend) and has two children.    He drinks one cup caffeinated coffee daily.     Family History:  The patient's family history includes Cancer in his maternal grandfather, maternal grandmother, and mother; Heart disease in his father; Kidney failure in his sister.   ROS:   Please see the history of present illness.    ROS All other systems reviewed and are negative.   PHYSICAL EXAM:   VS:  BP 138/78 (BP Location: Right Arm, Patient Position: Sitting, Cuff Size: Normal)   Pulse 70   Ht 5\' 7"  (1.702 m)   Wt 68.4 kg (150 lb 12.8 oz)   BMI 23.62 kg/m    GEN: Well  nourished, well developed, in no acute distress  HEENT: normal  Neck: no JVD, carotid bruits, or masses Cardiac: RRR; no murmurs, rubs, or gallops, bilateral ankle swelling 2+ left, 1+ right, and bilateral varicose veins Respiratory:  clear to auscultation bilaterally, normal work of breathing GI: soft, nontender, nondistended, + BS. Reducible left inguinal hernia MS: no deformity or atrophy  Skin: warm and dry, no rash Neuro:  Alert and Oriented x 3, Strength and sensation are intact Psych: euthymic mood, full affect  Wt Readings from Last 3 Encounters:  04/08/16 68.4 kg (150 lb 12.8 oz)  02/11/16 67.5 kg (148 lb 12.8 oz)  11/16/15 68.9 kg (152 lb)      Studies/Labs Reviewed:   ECHO: 03/14/2016 - Left ventricle: The cavity size was normal. Wall thickness was normal. Systolic function was normal. The estimated ejection   fraction was in the range of 60% to 65%. Wall motion was normal;there were no regional wall motion abnormalities. Features are   consistent with a pseudonormal left ventricular filling pattern, with concomitant abnormal relaxation and increased filling   pressure (grade 2 diastolic dysfunction). - Aortic valve: Moderately calcified annulus. Moderately thickened, moderately calcified leaflets. There was moderate regurgitation. - Mitral valve: There was mild regurgitation. - Pulmonary arteries: Systolic pressure was moderately increased. PA peak pressure: 44 mm Hg (S).  EKG:  EKG is ordered today.  The ekg ordered today demonstrates NSR. Repolarization abnormality are not seen. QTc 436 ms.  ASSESSMENT:    1. Preop cardiovascular exam   2. Benign essential hypertension   3. Echocardiography shows left ventricular diastolic dysfunction   4. Ascending aortic aneurysm ([place])   5. Acute on chronic renal insufficiency      PLAN:  In order of problems listed above:  1. Preop CV exam: Despite his advanced age and musculoskeletal problems I think he is at low risk for  major cardiac arrest or complications with planned surgical hernia repair. I agreed that the risk would be higher if we allow the hernia to proceed to complications and then have to do the surgery in an emergency. 2. HTN: Blood pressure is normal. 3. LV diastolic dysfunction: He does not have heart failure clinically and I do not find significant hypervolemia, despite the echo abnormalities. Avoid excessive intravenous fluids in the perioperative period. 4. Asc Ao aneurysm: Asymptomatic and incidentally detected. I doubt that he would be a candidate for surgical repair at his age and with his worsening frailty. With  recent worsening of renal function would like to avoid contrast based CT if possible.  5. Acute on chronic kidney disease: Recheck labs.   Medication Adjustments/Labs and Tests Ordered: Current medicines are reviewed at length with the patient today.  Concerns regarding medicines are outlined above.  Medication changes, Labs and Tests ordered today are listed in the Patient Instructions below. Patient Instructions  Dr Sallyanne Kuster recommends that you schedule a follow-up appointment in 1 year. You will receive a reminder letter in the mail two months in advance. If you don't receive a letter, please call our office to schedule the follow-up appointment.  If you need a refill on your cardiac medications before your next appointment, please call your pharmacy.   Signed, Sanda Klein, MD  04/09/2016 5:03 PM    [facility] [address], [place], [place]  [address] Phone: [phone]; Fax: [phone]

## 2016-04-08 NOTE — Patient Instructions (Signed)
Dr Croitoru recommends that you schedule a follow-up appointment in 1 year. You will receive a reminder letter in the mail two months in advance. If you don't receive a letter, please call our office to schedule the follow-up appointment.  If you need a refill on your cardiac medications before your next appointment, please call your pharmacy. 

## 2016-04-13 NOTE — Telephone Encounter (Signed)
Faxed clearance letter to Claiborne Billings, LPN at Dr Johney Maine' office [facility].

## 2016-05-12 ENCOUNTER — Telehealth: Payer: Self-pay | Admitting: Diagnostic Neuroimaging

## 2016-05-12 NOTE — Telephone Encounter (Signed)
Noted  

## 2016-05-12 NOTE — Telephone Encounter (Signed)
Pt called said about 2 weeks ago (around the 1st of March) he had slurred speech and tingling in the hands that lasted 1-2 min. He saw his nephrologist and was advised to see Dr Mamie Nick. He has not had any symptoms since. Appt was made for 3/26. Advised RN would call if she had any questions.

## 2016-05-13 ENCOUNTER — Other Ambulatory Visit: Payer: Self-pay | Admitting: Nephrology

## 2016-05-13 DIAGNOSIS — G459 Transient cerebral ischemic attack, unspecified: Secondary | ICD-10-CM

## 2016-05-20 ENCOUNTER — Ambulatory Visit: Payer: Medicare Other | Admitting: Cardiovascular Disease

## 2016-05-20 ENCOUNTER — Ambulatory Visit
Admission: RE | Admit: 2016-05-20 | Discharge: 2016-05-20 | Disposition: A | Discharge status: Home / Self Care | Payer: Medicare Other | Source: Ambulatory Visit | Attending: Nephrology | Admitting: Nephrology

## 2016-05-20 DIAGNOSIS — G459 Transient cerebral ischemic attack, unspecified: Secondary | ICD-10-CM

## 2016-05-23 ENCOUNTER — Ambulatory Visit (INDEPENDENT_AMBULATORY_CARE_PROVIDER_SITE_OTHER): Payer: Medicare Other | Admitting: Diagnostic Neuroimaging

## 2016-05-23 ENCOUNTER — Ambulatory Visit
Admission: RE | Admit: 2016-05-23 | Discharge: 2016-05-23 | Disposition: A | Discharge status: Home / Self Care | Payer: Medicare Other | Source: Ambulatory Visit | Attending: Diagnostic Neuroimaging | Admitting: Diagnostic Neuroimaging

## 2016-05-23 ENCOUNTER — Encounter: Payer: Self-pay | Admitting: Diagnostic Neuroimaging

## 2016-05-23 VITALS — BP 138/72 | HR 58 | Wt 147.8 lb

## 2016-05-23 DIAGNOSIS — G40909 Epilepsy, unspecified, not intractable, without status epilepticus: Secondary | ICD-10-CM

## 2016-05-23 DIAGNOSIS — G458 Other transient cerebral ischemic attacks and related syndromes: Secondary | ICD-10-CM

## 2016-05-23 DIAGNOSIS — G40209 Localization-related (focal) (partial) symptomatic epilepsy and epileptic syndromes with complex partial seizures, not intractable, without status epilepticus: Secondary | ICD-10-CM | POA: Diagnosis not present

## 2016-05-23 NOTE — Progress Notes (Signed)
[facility]  PATIENT: Charles Randall. DOB: 1927/10/08  REFERRING CLINICIAN:  HISTORY FROM: patient and friend  REASON FOR VISIT: follow up   HISTORICAL  CHIEF COMPLAINT:  Chief Complaint  Patient presents with  . Questionable TIA in March    rm 6, girlfriend- Tamela, "episode of tingling in lips and slurred speech x 10 in ealry March, no further episodes"    HISTORY OF PRESENT ILLNESS:   UPDATE 05/23/16: Since last visit, was doing well until 2 weeks ago, had 10 min episode of right face drooping, slurred speech, right hand tingling (~05/02/16). Then resolved. Did not seek medical attention. Saw Dr. Hollie Salk, and she referred patient to see [place]. No other alleviating or aggravating factors.  UPDATE 11/16/15: Since last visit, doing well. No definite seizures. Tolerating LEV 1500mg  BID. Patient not driving, due to concern from his friend about poss of un-witnessed seizures. No indication of this, but rather the theoretical possibility.   UPDATE 05/12/15: Since last visit, still having 1-2 seizures per year (staring spells; mouth automatisms) --> 2014 x 1; 2015 x 2; 2016 x 2. Still with interrupted sleep. Tolerating LEV 1000mg  BID.  UPDATE 05/29/12: Doing well. No seizures. Tolerating medications.  UPDATE 11/29/11: Has not had any seizure episodes. Tolerating LEV 1000mg  BID, tolerating well without mood changes or increased dizziness. No complaints today. He has been driving when he absolutely has no other way.   UPDATE 08/29/11: He has had 2 seizures on 08/11/11. Were similar to his previous seizures. Denies bowel/bladder incontinence. Denies sleep changes, missed doses, fever or poor food/fluid intake. He was increased to 1000mg  BID, tolerating well without dizziness or mood changes.   UPDATE 10/18/10: Had another spell on 10/13/10. Woke up normally, read paper. Then around 9am, was found by girlfried, slumped against wall in bedroom, with blood from mouth. Taken  to ER by EMS. He was somewhat combative and confused for 2-3 hours. CT head and neck unremarkable. Started on LEV 500mg  BID. No further events. Family reports poor sleep patterns, snoring and daytime sleepiness. Sleeps 5 hrs per night, tossing and turning. Able to fall asleep very easily.  UPDATE 06/30/10: No further events. Last spell 03/15/10. Not driving. Feels good. Testing reviewed.  PRIOR HPI: 83 yo right-handed Caucasian male with a past medical history of HTN, hyperlipidemia, post-polio syndrome, vitamin D deficiency, osteoporosis with history of compression fracture, left inguinal hernia, and eczema who presents for evaluation for possible atypical seizures. He is accompanied today by his long-term girlfriend who helps provide details regarding these episodes. Starting about 2-3 years ago and occurring about every 6 months, the patient has had spells where he will "blank out" for about 20-30 minutes. He reports complete amnesia during the spells, but denies any other symptoms occurring during or after the episodes. Other than a possible "warning surge" in his head immediately prior to the onset of the episodes, he denies any symptoms preceding the episodes. He has a hard time describing what the "warning surge" is. His girlfriend has noticed that during the episodes he has a "blank stare," "his face gets really white," and he will ask many questions over and over such as: "Where are we?" and "What are we doing here?" She denies any other signs, including loss of postural control, loss of bowel or bladder continence, orofacial twitching, tongue biting, etc. Immediately following the episodes, the patient is back to his normal self. 08/2008 to 09/2008: MRI brain w/ and w/out, MRA w/out, carotid dopplers, EEG,  and neurocognitive battery all unrevealing. 10-hour ambulatory EEG on 12/2009 which was negative. Most recent episode: March 15, 2010 around 12:30 PM, and was different from previous four  episodes. The girlfriend found him "slumped down" in a chair with his legs and arms "as stiff as a board" (arms flexed at elbow, legs flexed at knees, eyes closed). She was able to rouse him after about 3-4 minutes, and he reports being "as weak as water" afterward, as well as very tired, which led him to sleep for the rest of the day.   REVIEW OF SYSTEMS: Full 14 system review of systems performed and negative with exception of: dizziness leg swelling walking diff daytime sleepiness rash itching eczema weakness.  ALLERGIES: No Known Allergies  HOME MEDICATIONS: Outpatient Medications Prior to Visit  Medication Sig Dispense Refill  . aspirin EC 81 MG tablet Take 81 mg by mouth daily.    . Cholecalciferol (VITAMIN D-3) 1000 UNITS CAPS Take by mouth.    . levETIRAcetam (KEPPRA) 750 MG tablet Take 2 tablets (1,500 mg total) by mouth 2 (two) times daily. 360 tablet 4  . magnesium oxide (MAGOX 400) 400 (241.3 Mg) MG tablet Take 400 mg by mouth daily.    . Omega-3 Fatty Acids (FISH OIL) 1000 MG CAPS Take by mouth daily.    . vitamin B-12 (CYANOCOBALAMIN) 1000 MCG tablet Take 1,000 mcg by mouth daily.    . vitamin C (ASCORBIC ACID) 500 MG tablet Take 500 mg by mouth daily.    . vitamin E 400 UNIT capsule Take by mouth.    Marland Kitchen ketorolac (TORADOL) 10 MG tablet TAKE 1 TABLET BY MOUTH TWICE A DAY FOR 5 DAYS (Patient not taking: Reported on 05/23/2016) 10 tablet 0   No facility-administered medications prior to visit.     PAST MEDICAL HISTORY: Past Medical History:  Diagnosis Date  . Ankle edema    mild  . Aortic insufficiency    mild, moderate  . Dyslipidemia   . H/O dizziness   . H/O Doppler ultrasound 2010  . H/O echocardiogram 2013   aortic valve disorder, EF =>55%  . H/O echocardiogram 2012   AVD, EF =>55%  . HTN (hypertension)   . Lightheadedness   . Mitral regurgitation    mild  . Seizures ([place])    last sz 2016  . Tricuspid regurgitation    mild    PAST SURGICAL HISTORY: Past  Surgical History:  Procedure Laterality Date  . none      FAMILY HISTORY: Family History  Problem Relation Age of Onset  . Cancer Mother   . Heart disease Father   . Cancer Maternal Grandmother   . Cancer Maternal Grandfather   . Kidney failure Sister     SOCIAL HISTORY:  Social History   Social History  . Marital status: Unknown    Spouse name: N/A  . Number of children: 2  . Years of education: BA [employer]   Occupational History  . Retired    Social History Main Topics  . Smoking status: Former Smoker    Packs/day: 0.50    Quit date: 03/01/1963  . Smokeless tobacco: Never Used  . Alcohol use No  . Drug use: No  . Sexual activity: Not on file   Other Topics Concern  . Not on file   Social History Narrative   82 year old right-handed male who lives at home alone.  He is single (has long-term girlfriend) and has two children.    He  drinks one cup caffeinated coffee daily.     PHYSICAL EXAM  GENERAL EXAM/CONSTITUTIONAL: Vitals:  Vitals:   05/23/16 1134  BP: 138/72  Pulse: (!) 58  Weight: 147 lb 12.8 oz (67 kg)   Body mass index is 23.15 kg/m. No exam data present  Patient is in no distress; well developed, nourished and groomed; neck is supple  CARDIOVASCULAR:  Examination of carotid arteries is normal; no carotid bruits  DISTANT HEART SOUNDS; regular rate and rhythm, no murmurs  Examination of peripheral vascular system by observation and palpation is NOTABLE FOR DUSKY FEET AND VENOUS STASIS BILATERALLY  EYES:  Ophthalmoscopic exam of optic discs and posterior segments is normal; no papilledema or hemorrhages  MUSCULOSKELETAL:  Gait, strength, tone, movements noted in Neurologic exam below  NEUROLOGIC: MENTAL STATUS:  No flowsheet data found.  awake, alert, oriented to person, place and time  recent and remote memory intact  normal attention and concentration  language fluent, comprehension intact, naming intact,   fund of knowledge  appropriate  CRANIAL NERVE:   2nd - no papilledema on fundoscopic exam  2nd, 3rd, 4th, 6th - pupils equal and reactive to light, visual fields full to confrontation, extraocular muscles intact, no nystagmus  5th - facial sensation symmetric  7th - facial strength symmetric  8th - hearing intact  9th - palate elevates symmetrically, uvula midline  11th - shoulder shrug symmetric  12th - tongue protrusion midline  MOTOR:   normal bulk and tone, full strength in the BUE, BLE; EXCEPT FOR BILATERAL FOOT DROP (RIGHT 3, LEFT 2-3)  SENSORY:   normal and symmetric to light touch, temperature, vibration  COORDINATION:   finger-nose-finger, fine finger movements normal  REFLEXES:   deep tendon reflexes --> BUE 1; BLE 0  GAIT/STATION:   narrow based gait; USES CANE; SLIGHTLY UNSTEADY; CANNOT WALK ON HEELS OR TOES (FOOT DROP RELATED TO POLIO SYNDROME)    DIAGNOSTIC DATA (LABS, IMAGING, TESTING) - I reviewed patient records, labs, notes, testing and imaging myself where available.  Lab Results  Component Value Date   WBC 14.9 (H) 10/13/2010   HGB 15.6 10/13/2010   HCT 43.9 10/13/2010   MCV 84.7 10/13/2010   PLT 195 10/13/2010      Component Value Date/Time   NA 136 02/16/2016 1144   K 4.5 02/16/2016 1144   CL 99 02/16/2016 1144   CO2 24 02/16/2016 1144   GLUCOSE 96 02/16/2016 1144   BUN 58 (H) 02/16/2016 1144   CREATININE 2.11 (H) 02/16/2016 1144   CALCIUM 8.9 02/16/2016 1144   PROT 7.6 10/13/2010 1103   ALBUMIN 3.9 10/13/2010 1103   AST 43 (H) 10/13/2010 1103   ALT 23 10/13/2010 1103   ALKPHOS 119 (H) 10/13/2010 1103   BILITOT 0.5 10/13/2010 1103   GFRNONAA >60 10/13/2010 1103   GFRAA >60 10/13/2010 1103   No results found for: CHOL, HDL, LDLCALC, LDLDIRECT, TRIG, CHOLHDL No results found for: HGBA1C No results found for: VITAMINB12 No results found for: TSH  10/17/14 lipid panel: CHOL 200, TRIG 91, HDL 65, LDL 117  05/04/10 EEG - normal  05/11/10 MRI  BRAIN  - mild chronic small vessel ischemic disease.  03/04/16 TTE - Left ventricle: The cavity size was normal. Wall thickness was   normal. Systolic function was normal. The estimated ejection   fraction was in the range of 60% to 65%. Wall motion was normal;   there were no regional wall motion abnormalities. Features are   consistent with a  pseudonormal left ventricular filling pattern,   with concomitant abnormal relaxation and increased filling   pressure (grade 2 diastolic dysfunction). - Aortic valve: Moderately calcified annulus. Moderately thickened,   moderately calcified leaflets. There was moderate regurgitation. - Mitral valve: There was mild regurgitation. - Pulmonary arteries: Systolic pressure was moderately increased.   PA peak pressure: 44 mm Hg (S).  04/08/16 EKG [I reviewed images myself and agree with interpretation. -VRP]  - normal sinus rhythm   05/20/16 carotid u/s - Minimal to moderate amount of bilateral atherosclerotic plaque, left greater than right, unchanged to minimally progressed compared to the 08/2008 examination though again not resulting in elevated peak systolic velocities within either internal carotid artery.     ASSESSMENT AND PLAN  83 y.o. 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 Last seizure was in 12/09/14.   Also with possible TIA on week of 05/02/16, right face drooping, right hand tingling, and slurred speech.   Dx: TIA vs seizure  1. Other specified transient cerebral ischemias   2. Seizure disorder ([place])   3. Partial symptomatic epilepsy with complex partial seizures, not intractable, without status epilepticus ([place])      PLAN:  TIA workup / stroke prevention - check MRI brain to complete TIA workup - continue aspirin 81mg  daily - postpone elective surgery for at least 3 months after TIA - follow up with PCP re: BP, lipid and diabetes screening  and mgmt  SEIZURE DISORDER - continue LEV 1500mg  twice a day - although no definite witnessed seizures recently, patient and friend are concerned about possibility of un-witnessed seizures, and they both feel better safety if patient does not drive; I agree with plan  Return in about 3 months (around 08/23/2016).  Orders Placed This Encounter  Procedures  . MR BRAIN WO CONTRAST   I reviewed images, labs, notes, records myself. I summarized findings and reviewed with patient, for this high risk condition (TIA) requiring high complexity decision making.    Penni Bombard, MD 4/97/0263, 78:58 AM Certified in Neurology, Neurophysiology and Neuroimaging  [facility] [address] [place], [place] [address] [phone]

## 2016-05-23 NOTE — Patient Instructions (Signed)
-   check MRI brain to complete TIA workup  - continue aspirin 81mg  daily  - postpone elective surgery for at least 3 months after TIA  - follow up with PCP re: BP, lipid and diabetes screening and treatment

## 2016-06-10 ENCOUNTER — Encounter (HOSPITAL_COMMUNITY): Admission: RE | Admit: 2016-06-10 | Payer: Medicare Other | Source: Ambulatory Visit

## 2016-06-14 ENCOUNTER — Ambulatory Visit: Admit: 2016-06-14 | Payer: Medicare Other | Admitting: Surgery

## 2016-06-14 SURGERY — REPAIR, HERNIA, INGUINAL, BILATERAL, LAPAROSCOPIC
Anesthesia: General | Laterality: Bilateral

## 2016-06-23 ENCOUNTER — Telehealth: Payer: Self-pay | Admitting: *Deleted

## 2016-06-23 NOTE — Telephone Encounter (Signed)
-----   Message from Penni Bombard, MD sent at 06/21/2016  4:07 PM EDT ----- Mild spots of scar tissue and brain atrophy. No major findings. Please call patient. Continue current plan. -VRP

## 2016-06-23 NOTE — Telephone Encounter (Signed)
Spoke to pt and relayed that his MRI brain results showed no major findings, just mild spots of scar tissue and brain atrophy, no stroke.  Continue current plan of care.  Pt verbalized understanding.

## 2016-08-30 ENCOUNTER — Encounter: Payer: Self-pay | Admitting: Diagnostic Neuroimaging

## 2016-08-30 ENCOUNTER — Ambulatory Visit (INDEPENDENT_AMBULATORY_CARE_PROVIDER_SITE_OTHER): Payer: Medicare Other | Admitting: Diagnostic Neuroimaging

## 2016-08-30 VITALS — BP 115/75 | HR 97 | Ht 67.0 in | Wt 145.6 lb

## 2016-08-30 DIAGNOSIS — G458 Other transient cerebral ischemic attacks and related syndromes: Secondary | ICD-10-CM | POA: Diagnosis not present

## 2016-08-30 DIAGNOSIS — G40909 Epilepsy, unspecified, not intractable, without status epilepticus: Secondary | ICD-10-CM | POA: Diagnosis not present

## 2016-08-30 MED ORDER — LEVETIRACETAM 750 MG PO TABS: 1500.0000 mg | ORAL_TABLET | Freq: Two times a day (BID) | ORAL | 4 refills | Status: DC

## 2016-08-30 NOTE — Progress Notes (Signed)
[facility]  PATIENT: Charles Randall. DOB: [birth year]  REFERRING CLINICIAN:  HISTORY FROM: patient REASON FOR VISIT: follow up   HISTORICAL  CHIEF COMPLAINT:  Chief Complaint  Patient presents with  . Follow-up  . Transient Ischemic Attack    doing well, using walker when out and about    HISTORY OF PRESENT ILLNESS:   UPDATE 08/30/16: Since last visit, doing well. No more TIA or seizures. Tolerating medications.   UPDATE 05/23/16: Since last visit, was doing well until 2 weeks ago, had 10 min episode of right face drooping, slurred speech, right hand tingling (~05/02/16). Then resolved. Did not seek medical attention. Saw Dr. Hollie Salk, and she referred patient to see [place]. No other alleviating or aggravating factors.  UPDATE 11/16/15: Since last visit, doing well. No definite seizures. Tolerating LEV 1500mg  BID. Patient not driving, due to concern from his friend about poss of un-witnessed seizures. No indication of this, but rather the theoretical possibility.   UPDATE 05/12/15: Since last visit, still having 1-2 seizures per year (staring spells; mouth automatisms) --> 2014 x 1; 2015 x 2; 2016 x 2. Still with interrupted sleep. Tolerating LEV 1000mg  BID.  UPDATE 05/29/12: Doing well. No seizures. Tolerating medications.  UPDATE 11/29/11: Has not had any seizure episodes. Tolerating LEV 1000mg  BID, tolerating well without mood changes or increased dizziness. No complaints today. He has been driving when he absolutely has no other way.   UPDATE 08/29/11: He has had 2 seizures on 08/11/11. Were similar to his previous seizures. Denies bowel/bladder incontinence. Denies sleep changes, missed doses, fever or poor food/fluid intake. He was increased to 1000mg  BID, tolerating well without dizziness or mood changes.   UPDATE 10/18/10: Had another spell on 10/13/10. Woke up normally, read paper. Then around 9am, was found by girlfried, slumped against wall in bedroom,  with blood from mouth. Taken to ER by EMS. He was somewhat combative and confused for 2-3 hours. CT head and neck unremarkable. Started on LEV 500mg  BID. No further events. Family reports poor sleep patterns, snoring and daytime sleepiness. Sleeps 5 hrs per night, tossing and turning. Able to fall asleep very easily.  UPDATE 06/30/10: No further events. Last spell 03/15/10. Not driving. Feels good. Testing reviewed.  PRIOR HPI: 83 yo right-handed Caucasian male with a past medical history of HTN, hyperlipidemia, post-polio syndrome, vitamin D deficiency, osteoporosis with history of compression fracture, left inguinal hernia, and eczema who presents for evaluation for possible atypical seizures. He is accompanied today by his long-term girlfriend who helps provide details regarding these episodes. Starting about 2-3 years ago and occurring about every 6 months, the patient has had spells where he will "blank out" for about 20-30 minutes. He reports complete amnesia during the spells, but denies any other symptoms occurring during or after the episodes. Other than a possible "warning surge" in his head immediately prior to the onset of the episodes, he denies any symptoms preceding the episodes. He has a hard time describing what the "warning surge" is. His girlfriend has noticed that during the episodes he has a "blank stare," "his face gets really white," and he will ask many questions over and over such as: "Where are we?" and "What are we doing here?" She denies any other signs, including loss of postural control, loss of bowel or bladder continence, orofacial twitching, tongue biting, etc. Immediately following the episodes, the patient is back to his normal self. 08/2008 to 09/2008: MRI brain w/ and w/out, MRA w/out,  carotid dopplers, EEG, and neurocognitive battery all unrevealing. 10-hour ambulatory EEG on 12/2009 which was negative. Most recent episode: March 15, 2010 around 12:30 PM, and was  different from previous four episodes. The girlfriend found him "slumped down" in a chair with his legs and arms "as stiff as a board" (arms flexed at elbow, legs flexed at knees, eyes closed). She was able to rouse him after about 3-4 minutes, and he reports being "as weak as water" afterward, as well as very tired, which led him to sleep for the rest of the day.   REVIEW OF SYSTEMS: Full 14 system review of systems performed and negative with exception of: speech diff ringing in ears eye itching walking diff neck stiffness.  ALLERGIES: No Known Allergies  HOME MEDICATIONS: Outpatient Medications Prior to Visit  Medication Sig Dispense Refill  . aspirin EC 81 MG tablet Take 81 mg by mouth daily.    . Cholecalciferol (VITAMIN D-3) 1000 UNITS CAPS Take by mouth.    . magnesium oxide (MAGOX 400) 400 (241.3 Mg) MG tablet Take 400 mg by mouth daily.    . Omega-3 Fatty Acids (FISH OIL) 1000 MG CAPS Take by mouth daily.    Marland Kitchen triamterene-hydrochlorothiazide (MAXZIDE-25) 37.5-25 MG tablet Take 1 tablet by mouth daily.    . vitamin B-12 (CYANOCOBALAMIN) 1000 MCG tablet Take 1,000 mcg by mouth daily.    . vitamin C (ASCORBIC ACID) 500 MG tablet Take 500 mg by mouth daily.    . vitamin E 400 UNIT capsule Take by mouth.    . levETIRAcetam (KEPPRA) 750 MG tablet Take 2 tablets (1,500 mg total) by mouth 2 (two) times daily. 360 tablet 4  . ketorolac (TORADOL) 10 MG tablet TAKE 1 TABLET BY MOUTH TWICE A DAY FOR 5 DAYS (Patient not taking: Reported on 08/30/2016) 10 tablet 0   No facility-administered medications prior to visit.     PAST MEDICAL HISTORY: Past Medical History:  Diagnosis Date  . Ankle edema    mild  . Aortic insufficiency    mild, moderate  . Dyslipidemia   . H/O dizziness   . H/O Doppler ultrasound 2010  . H/O echocardiogram 2013   aortic valve disorder, EF =>55%  . H/O echocardiogram 2012   AVD, EF =>55%  . HTN (hypertension)   . Lightheadedness   . Mitral regurgitation     mild  . Seizures ([place])    last sz 2016  . Tricuspid regurgitation    mild    PAST SURGICAL HISTORY: Past Surgical History:  Procedure Laterality Date  . none      FAMILY HISTORY: Family History  Problem Relation Age of Onset  . Cancer Mother   . Heart disease Father   . Cancer Maternal Grandmother   . Cancer Maternal Grandfather   . Kidney failure Sister     SOCIAL HISTORY:  Social History   Social History  . Marital status: Unknown    Spouse name: N/A  . Number of children: 2  . Years of education: BA [employer]   Occupational History  . Retired    Social History Main Topics  . Smoking status: Former Smoker    Packs/day: 0.50    Quit date: 03/01/1963  . Smokeless tobacco: Never Used  . Alcohol use No  . Drug use: No  . Sexual activity: Not on file   Other Topics Concern  . Not on file   Social History Narrative   83 year old right-handed male who lives  at home alone.  He is single (has long-term girlfriend) and has two children.    He drinks one cup caffeinated coffee daily.     PHYSICAL EXAM  GENERAL EXAM/CONSTITUTIONAL: Vitals:  Vitals:   08/30/16 1505  BP: 115/75  Pulse: 97  Weight: 145 lb 9.6 oz (66 kg)  Height: 5\' 7"  (1.702 m)   Body mass index is 22.8 kg/m. No exam data present  Patient is in no distress; well developed, nourished and groomed; neck is supple  CARDIOVASCULAR:  Examination of carotid arteries is normal; no carotid bruits  Regular rate and rhythm, no murmurs  Examination of peripheral vascular system by observation and palpation is NOTABLE FOR DUSKY FEET AND VENOUS STASIS BILATERALLY  EYES:  Ophthalmoscopic exam of optic discs and posterior segments is normal; no papilledema or hemorrhages  MUSCULOSKELETAL:  Gait, strength, tone, movements noted in Neurologic exam below  NEUROLOGIC: MENTAL STATUS:  No flowsheet data found.  awake, alert, oriented to person, place and time  recent and remote memory  intact  normal attention and concentration  language fluent, comprehension intact, naming intact,   fund of knowledge appropriate  CRANIAL NERVE:   2nd - no papilledema on fundoscopic exam  2nd, 3rd, 4th, 6th - pupils equal and reactive to light, visual fields full to confrontation, extraocular muscles intact, no nystagmus; MILD LEFT PTOSIS (POST-SURGICAL)  5th - facial sensation symmetric  7th - facial strength symmetric  8th - hearing intact  9th - palate elevates symmetrically, uvula midline  11th - shoulder shrug symmetric  12th - tongue protrusion midline  MOTOR:   normal bulk and tone, full strength in the BUE, BLE; EXCEPT FOR BILATERAL FOOT DROP (RIGHT 3, LEFT 2-3)  SENSORY:   normal and symmetric to light touch, temperature, vibration  COORDINATION:   finger-nose-finger, fine finger movements normal  REFLEXES:   deep tendon reflexes --> BUE 1; BLE 0  GAIT/STATION:   narrow based gait; USES WALKER; SLIGHTLY UNSTEADY; CANNOT WALK ON HEELS OR TOES (FOOT DROP RELATED TO POLIO SYNDROME)    DIAGNOSTIC DATA (LABS, IMAGING, TESTING) - I reviewed patient records, labs, notes, testing and imaging myself where available.  Lab Results  Component Value Date   WBC 14.9 (H) 10/13/2010   HGB 15.6 10/13/2010   HCT 43.9 10/13/2010   MCV 84.7 10/13/2010   PLT 195 10/13/2010      Component Value Date/Time   NA 136 02/16/2016 1144   K 4.5 02/16/2016 1144   CL 99 02/16/2016 1144   CO2 24 02/16/2016 1144   GLUCOSE 96 02/16/2016 1144   BUN 58 (H) 02/16/2016 1144   CREATININE 2.11 (H) 02/16/2016 1144   CALCIUM 8.9 02/16/2016 1144   PROT 7.6 10/13/2010 1103   ALBUMIN 3.9 10/13/2010 1103   AST 43 (H) 10/13/2010 1103   ALT 23 10/13/2010 1103   ALKPHOS 119 (H) 10/13/2010 1103   BILITOT 0.5 10/13/2010 1103   GFRNONAA >60 10/13/2010 1103   GFRAA >60 10/13/2010 1103   No results found for: CHOL, HDL, LDLCALC, LDLDIRECT, TRIG, CHOLHDL No results found for:  HGBA1C No results found for: VITAMINB12 No results found for: TSH  10/17/14 lipid panel: CHOL 200, TRIG 91, HDL 65, LDL 117  05/04/10 EEG - normal  05/11/10 MRI BRAIN  - mild chronic small vessel ischemic disease.  03/04/16 TTE - Left ventricle: The cavity size was normal. Wall thickness was   normal. Systolic function was normal. The estimated ejection   fraction was in the range  of 60% to 65%. Wall motion was normal;   there were no regional wall motion abnormalities. Features are   consistent with a pseudonormal left ventricular filling pattern,   with concomitant abnormal relaxation and increased filling   pressure (grade 2 diastolic dysfunction). - Aortic valve: Moderately calcified annulus. Moderately thickened,   moderately calcified leaflets. There was moderate regurgitation. - Mitral valve: There was mild regurgitation. - Pulmonary arteries: Systolic pressure was moderately increased.   PA peak pressure: 44 mm Hg (S).  04/08/16 EKG [I reviewed images myself and agree with interpretation. -VRP]  - normal sinus rhythm   05/20/16 carotid u/s - Minimal to moderate amount of bilateral atherosclerotic plaque, left greater than right, unchanged to minimally progressed compared to the 08/2008 examination though again not resulting in elevated peak systolic velocities within either internal carotid artery.  05/23/16 MRI brain (without) [I reviewed images myself and agree with interpretation. -VRP] 1. Mild perisylvian and temporal atrophy.  2. Mild periventricular and subcortical chronic small vessel ischemic disease.  3. No acute findings. 4. Compared to MRI on 09/18/08, there has been progression of atrophy and chronic small vessel ischemic disease.       ASSESSMENT AND PLAN  83 y.o. 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 Last seizure was in 12/09/14.   Also with possible TIA on week of 05/02/16,  right face drooping, right hand tingling, and slurred speech.   Dx: TIA vs seizure  1. Other specified transient cerebral ischemias   2. Seizure disorder ([place])      PLAN:  TIA workup / stroke prevention (established problem, stable) - continue aspirin 81mg  daily - follow up with PCP re: BP, lipid and diabetes screening and mgmt  SEIZURE DISORDER (established problem, stable) - continue LEV 1500mg  twice a day  Meds ordered this encounter  Medications  . levETIRAcetam (KEPPRA) 750 MG tablet    Sig: Take 2 tablets (1,500 mg total) by mouth 2 (two) times daily.    Dispense:  360 tablet    Refill:  4   Return in about 1 year (around 08/30/2017) for with Bobbie Stack or Nickole Adamek.     Penni Bombard, MD 07/31/2631, 3:54 PM Certified in Neurology, Neurophysiology and Neuroimaging  [facility] [address], [place], [place] [address] [phone]

## 2016-11-15 ENCOUNTER — Ambulatory Visit: Payer: Medicare Other | Admitting: Diagnostic Neuroimaging

## 2017-02-27 NOTE — Telephone Encounter (Signed)
x

## 2017-06-05 ENCOUNTER — Ambulatory Visit: Payer: Self-pay | Admitting: Surgery

## 2017-06-05 NOTE — H&P (Signed)
Charles Randall Documented: 06/05/2017 11:46 AM Location: [facility] Patient #: [id number] DOB: 12/09/1927 Single / Language: Cleophus Molt / Race: White Male  History of Present Illness Charles Randall; 06/05/2017 12:38 PM) The patient is a 84 year old male who presents with an inguinal hernia. Note for "Inguinal hernia": ` ` ` Patient returns for concerns of his left inguinal hernia.  Pleasant elderly but active male. He returns one year later. I had seen him last March. He was complaining of a symptomatic left inguinal hernia. I thought he might have a right inguinal hernias well. Some chronic constipation. 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 I offered surgical repair since he was complaining of pain with activity and errands and yardwork.  He had what sounds like a mini stroke/TIA a few weeks later. Eventually discussed with primary care physician in neurology. Workup was underwhelming. No new issues in the year. He establish with a new primary care physician. There is discussion about reconsidering hernia surgery. He comes in to consider that.  He's not on blood thinners. He self stopped his aspirin. No fevers or chills. He tells me he doesn't have much pain or discomfort. However is wife had hernia surgery by me and she is worried about a becoming an emergency like hers was. He is leaning towards reconsidering surgery. 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 She keeps on his balance. No problems with urination or defecation. No cardiac issues. No lung she is. Has not smoked cigarettes in over 50 years. Episode of chest pain. His cardiologist was concerned. Workup proved that he had a pneumonia. Resolved after 10 days of antibiotics. Had an echocardiogram done in December 2017 which showed an ejection fraction around 60%.   While he is not ecstatic to have  surgery, he does feel like the groin was rather bothering him. Not so much now but feels like it is going to get it fixed, he wishes to get that fixed if possible.  (Review of systems as stated in this history (HPI) or in the review of systems. Otherwise all other 12 point ROS are negative) ` ` `   Problem List/Past Medical Charles Hector, Randall; 06/05/2017 12:34 PM) BILATERAL INGUINAL HERNIA WITHOUT OBSTRUCTION OR GANGRENE, RECURRENCE NOT SPECIFIED (K40.20) PREOP - ING HERNIA - ENCOUNTER FOR PREOPERATIVE EXAMINATION FOR GENERAL SURGICAL PROCEDURE (Z01.818) CHRONIC CONSTIPATION (K59.09)  Past Surgical History Charles Hector, Randall; 06/05/2017 12:34 PM) No pertinent past surgical history  Allergies Sabino Gasser; 06/05/2017 11:46 AM) No Known Drug Allergies [03/21/2016]: Allergies Reconciled  Medication History Sabino Gasser; 06/05/2017 11:46 AM) LevETIRAcetam (750MG  Tablet, Oral) Active. Aspirin (81MG  Tablet, Oral) Active. Vitamin D3 (1000UNIT Tablet, Oral) Active. Magnesium Oxide (400MG  Tablet, Oral) Active. Fish Oil (1000MG  Capsule, Oral) Active. Vitamin B12 (1000MCG Tablet ER, Oral) Active. Vitamin C (500MG  Tablet, Oral) Active. Vitamin E (400UNIT Capsule, Oral) Active. Medications Reconciled  Social History Charles Hector, Randall; 06/05/2017 12:34 PM) Alcohol use Occasional alcohol use. Caffeine use Coffee. Tobacco use Former smoker.  Family History Charles Hector, Randall; 06/05/2017 12:34 PM) Cervical Cancer Mother. Heart Disease Father.  Other Problems Charles Hector, Randall; 06/05/2017 12:34 PM) Seizure Disorder     Review of Systems Charles Hector, Randall; 06/05/2017 12:34 PM) General Present- Fatigue. Not Present- Appetite Loss, Chills, Fever, Night Sweats, Weight Gain and Weight Loss. Skin Present- Dryness and Non-Healing Wounds. Not Present-  Change in Wart/Mole, Hives, Jaundice, New Lesions, Rash and Ulcer. HEENT Present- Ringing in the Ears, Sore Throat  and Wears glasses/contact lenses. Not Present- Earache, Hearing Loss, Hoarseness, Nose Bleed, Oral Ulcers, Seasonal Allergies, Sinus Pain, Visual Disturbances and Yellow Eyes. Cardiovascular Present- Swelling of Extremities. Not Present- Chest Pain, Difficulty Breathing Lying Down, Leg Cramps, Palpitations, Rapid Heart Rate and Shortness of Breath. Gastrointestinal Present- Constipation and Difficulty Swallowing. Not Present- Abdominal Pain, Bloating, Bloody Stool, Change in Bowel Habits, Chronic diarrhea, Excessive gas, Gets full quickly at meals, Hemorrhoids, Indigestion, Nausea, Rectal Pain and Vomiting.  Vitals Sabino Gasser; 06/05/2017 11:47 AM) 06/05/2017 11:46 AM Weight: 149.38 lb Height: 67in Body Surface Area: 1.79 m Body Mass Index: 23.4 kg/m  Temp.: 97.33F(Oral)  Pulse: 65 (Regular)  BP: 124/70 (Sitting, Left Arm, Standard)      Physical Exam Charles Randall; 06/05/2017 11:54 AM)  General Mental Status-Alert. General Appearance-Not in acute distress, Not Sickly. Orientation-Oriented X3. Hydration-Well hydrated. Voice-Normal.  Integumentary Global Assessment Upon inspection and palpation of skin surfaces of the - Axillae: non-tender, no inflammation or ulceration, no drainage. and Distribution of scalp and body hair is normal. General Characteristics Temperature - normal warmth is noted.  Head and Neck Head-normocephalic, atraumatic with no lesions or palpable masses. Face Global Assessment - atraumatic, no absence of expression. Neck Global Assessment - no abnormal movements, no bruit auscultated on the right, no bruit auscultated on the left, no decreased range of motion, non-tender. Trachea-midline. Thyroid Gland Characteristics - non-tender.  Eye Eyeball - Left-Extraocular movements intact, No Nystagmus. Eyeball - Right-Extraocular movements intact, No Nystagmus. Cornea - Left-No Hazy. Cornea - Right-No  Hazy. Sclera/Conjunctiva - Left-No scleral icterus, No Discharge. Sclera/Conjunctiva - Right-No scleral icterus, No Discharge. Pupil - Left-Direct reaction to light normal. Pupil - Right-Direct reaction to light normal.  ENMT Ears Pinna - Left - no drainage observed, no generalized tenderness observed. Right - no drainage observed, no generalized tenderness observed. Nose and Sinuses External Inspection of the Nose - no destructive lesion observed. Inspection of the nares - Left - quiet respiration. Right - quiet respiration. Mouth and Throat Lips - Upper Lip - no fissures observed, no pallor noted. Lower Lip - no fissures observed, no pallor noted. Nasopharynx - no discharge present. Oral Cavity/Oropharynx - Tongue - no dryness observed. Oral Mucosa - no cyanosis observed. Hypopharynx - no evidence of airway distress observed.  Chest and Lung Exam Inspection Movements - Normal and Symmetrical. Accessory muscles - No use of accessory muscles in breathing. Palpation Palpation of the chest reveals - Non-tender. Auscultation Breath sounds - Normal and Clear.  Cardiovascular Auscultation Rhythm - Regular. Murmurs & Other Heart Sounds - Auscultation of the heart reveals - No Murmurs and No Systolic Clicks.  Abdomen Inspection Inspection of the abdomen reveals - No Visible peristalsis and No Abnormal pulsations. Umbilicus - No Bleeding, No Urine drainage. Palpation/Percussion Palpation and Percussion of the abdomen reveal - Soft, Non Tender, No Rebound tenderness, No Rigidity (guarding) and No Cutaneous hyperesthesia. Note: Abdomen soft. Nontender. Not distended. No umbilical or incisional hernias. No guarding.  Male Genitourinary Sexual Maturity Tanner 5 - Adult hair pattern and Adult penile size and shape.  Obvious left groin bulging.  Sensitive but reducible inguinal hernia.  Impulse on right side concerning for inguinal hernias well.  Otherwise normal external male  genitalia.  Mild testicular atrophy.  No masses.  Peripheral Vascular Upper Extremity Inspection - Left - No Cyanotic nailbeds, Not Ischemic. Right - No Cyanotic nailbeds, Not  Ischemic.  Neurologic Neurologic evaluation reveals -normal attention span and ability to concentrate, able to name objects and repeat phrases. Appropriate fund of knowledge , normal sensation and normal coordination. Mental Status Affect - not angry, not paranoid. Cranial Nerves-Normal Bilaterally. Gait-Normal.  Neuropsychiatric Mental status exam performed with findings of-able to articulate well with normal speech/language, rate, volume and coherence, thought content normal with ability to perform basic computations and apply abstract reasoning and no evidence of hallucinations, delusions, obsessions or homicidal/suicidal ideation.  Musculoskeletal Global Assessment Spine, Ribs and Pelvis - no instability, subluxation or laxity. Right Upper Extremity - no instability, subluxation or laxity.  Lymphatic Head & Neck  General Head & Neck Lymphatics: Bilateral - Description - No Localized lymphadenopathy. Axillary  General Axillary Region: Bilateral - Description - No Localized lymphadenopathy. Femoral & Inguinal  Generalized Femoral & Inguinal Lymphatics: Left - Description - No Localized lymphadenopathy. Right - Description - No Localized lymphadenopathy.    Assessment & Plan Charles Randall; 06/05/2017 12:34 PM)  BILATERAL INGUINAL HERNIA WITHOUT OBSTRUCTION OR GANGRENE, RECURRENCE NOT SPECIFIED (K40.20) Impression: Obvious left groin and probable small right inguinal hernia. I think he would benefit from hernia repair.  His advanced age, he is relatively active. He does not like how it hurts and affects his ability to get around in his quality of life. They wish to be aggressive proceed.  His lungs his cardiologist, Dr. Tommie Raymond, agrees. Echocardiogram showed a good ejection fraction around  60%. He did have an episode of chest pain that turned out to be a pneumonia that resolved with antibiotics. No real edema right now. His operative risks are increased, but not prohibitive in my mind. We'll see what the rest of his doctors think.  Current Plans I recommended obtaining preoperative cardiac clearance. I am concerned about the health of the patient and the ability to tolerate the operation. Therefore, we will request clearance by cardiology to better assess operative risk & see if a reevaluation, further workup, etc is needed. Also recommendations on how medications such as for anticoagulation and blood pressure should be managed/held/restarted after surgery.  PREOP - ING HERNIA - ENCOUNTER FOR PREOPERATIVE EXAMINATION FOR GENERAL SURGICAL PROCEDURE (Z01.818)  Current Plans You are being scheduled for surgery- Our schedulers will call you.  You should hear from our office's scheduling department within 5 working days about the location, date, and time of surgery. We try to make accommodations for patient's preferences in scheduling surgery, but sometimes the OR schedule or the surgeon's schedule prevents [place] from making those accommodations.  If you have not heard from our office [phone]) in 5 working days, call the office and ask for your surgeon's nurse.  If you have other questions about your diagnosis, plan, or surgery, call the office and ask for your surgeon's nurse.  Written instructions provided The anatomy & physiology of the abdominal wall and pelvic floor was discussed. The pathophysiology of hernias in the inguinal and pelvic region was discussed. Natural history risks such as progressive enlargement, pain, incarceration, and strangulation was discussed. Contributors to complications such as smoking, obesity, diabetes, prior surgery, etc were discussed.  I feel the risks of no intervention will lead to serious problems that outweigh the operative risks;  therefore, I recommended surgery to reduce and repair the hernia. I explained laparoscopic techniques with possible need for an open approach. I noted usual use of mesh to patch and/or buttress hernia repair  Risks such as bleeding, infection, abscess, need for further treatment, heart  attack, death, and other risks were discussed. I noted a good likelihood this will help address the problem. Goals of post-operative recovery were discussed as well. Possibility that this will not correct all symptoms was explained. I stressed the importance of low-impact activity, aggressive pain control, avoiding constipation, & not pushing through pain to minimize risk of post-operative chronic pain or injury. Possibility of reherniation was discussed. We will work to minimize complications.  An educational handout further explaining the pathology & treatment options was given as well. Questions were answered. The patient expresses understanding & wishes to proceed with surgery.  Pt Education - Pamphlet Given - Laparoscopic Hernia Repair: discussed with patient and provided information. Pt Education - [facility] Pain Control (Shaely Gadberry) Pt Education - [facility] Hernia Post-Op HCI (Leana Springston): discussed with patient and provided information. Pt Education - [facility] Mesh education: discussed with patient and provided information.  CHRONIC CONSTIPATION (K59.09)  Current Plans Pt Education - [facility] Constipation (AT) Pt Education - [facility] Good Bowel Health (Shequila Neglia)

## 2017-06-06 ENCOUNTER — Telehealth: Payer: Self-pay

## 2017-06-06 NOTE — Telephone Encounter (Signed)
   [facility] Pre-operative Risk Assessment    Request for surgical clearance:  1. What type of surgery is being performed? Hernia Repair  2. When is this surgery scheduled? TBD  3. What type of clearance is required (medical clearance vs. Pharmacy clearance to hold med vs. Both)? Both  4. Are there any medications that need to be held prior to surgery and how long? ASA  5. Practice name and name of physician performing surgery?   [facility]  6. What is your office phone and fax number? [phone] Fax [phone]   7. Anesthesia type (None, local, MAC, general) ? General   Charles Randall 06/06/2017, 5:19 PM  _________________________________________________________________   (provider comments below)

## 2017-06-08 NOTE — Telephone Encounter (Signed)
   Primary Cardiologist:Mihai Croitoru, MD  Chart reviewed as part of pre-operative protocol coverage. Because of Charles HACKLER Jr.'s past medical history and time since last visit, he/she will require a follow-up visit in order to better assess preoperative cardiovascular risk.  Pre-op covering staff: - Please schedule appointment and call patient to inform them. - Please contact requesting surgeon's office via preferred method (i.e, phone, fax) to inform them of need for appointment prior to surgery.  Tami Lin Braden Cimo, PA  06/08/2017, 4:26 PM

## 2017-06-08 NOTE — Telephone Encounter (Signed)
Left a detailed message for the pt re: surgical clearance for hernia repair. Pt will need to be seen before this can be done. Pt has been advised to contact Dr. Victorino December office and schedule an appt.

## 2017-07-04 NOTE — Progress Notes (Signed)
Cardiology Office Note   Date:  07/05/2017   ID:  Charles Big., DOB Dec 23, 1927, MRN [id number]  PCP:  Nickola Major, MD  Cardiologist: Dr. Sallyanne Kuster Chief Complaint  Patient presents with  . Pre-op Exam     History of Present Illness: Charles Randall. is a 84 y.o. male who presents for ongoing assessment and management of hypertension, mild to moderate aortic insufficiency, history of syncope, seizure disorder.  Patient is noted to have asymmetry of his lower extremities due to remote polio syndrome.  Patient has occasional bilateral ankle swelling that usually resolves overnight when lying in the supine position, but worsens during the day.    He also was noted to have prominent varicose veins.  He is not on diuretic or ARB secondary to worsening renal function, his creatinine was 1.45-2.11 in December 2017.  The patient was last seen by Dr. Sallyanne Kuster on 04/08/2016 for preoperative evaluation in the setting of need for hernia repair.  At that time he was cleared from a cardiac standpoint to proceed.  The patient, however, chose not to proceed with surgery at that time.  The patient comes again for cardiac clearance for left inguinal hernia repair as it has become worse and more protuberant.  He had not been seen in our office since 2018 and required office visit for evaluation and clearance.Marland Kitchen  He is without any cardiac complaints, no syncope, presyncope, or dizziness.  He denies chest pain dyspnea on exertion, or worsening fatigue.  The patient is very sedentary concerning his lower extremity weakness post polio syndrome he is able to walk but it is very difficult for him.  Since being seen last he has not had any hospitalizations or ER visits.  Antihypertensive medications has been changed from Maxzide to lisinopril HCTZ by primary care.  He has had follow-up labs via PCP and Dr. Johney Maine.  Past Medical History:  Diagnosis Date  . Ankle edema    mild  . Aortic insufficiency    mild,  moderate  . Dyslipidemia   . H/O dizziness   . H/O Doppler ultrasound 2010  . H/O echocardiogram 2013   aortic valve disorder, EF =>55%  . H/O echocardiogram 2012   AVD, EF =>55%  . HTN (hypertension)   . Lightheadedness   . Mitral regurgitation    mild  . Seizures ([place])    last sz 2016  . Tricuspid regurgitation    mild    Past Surgical History:  Procedure Laterality Date  . none       Current Outpatient Medications  Medication Sig Dispense Refill  . Cholecalciferol (VITAMIN D-3) 1000 UNITS CAPS Take by mouth.    . levETIRAcetam (KEPPRA) 750 MG tablet Take 2 tablets (1,500 mg total) by mouth 2 (two) times daily. 360 tablet 4  . lisinopril-hydrochlorothiazide (PRINZIDE,ZESTORETIC) 20-25 MG tablet Take 1 tablet by mouth every morning.  0  . magnesium oxide (MAGOX 400) 400 (241.3 Mg) MG tablet Take 400 mg by mouth daily.    . Omega-3 Fatty Acids (FISH OIL) 1000 MG CAPS Take by mouth daily.    . vitamin B-12 (CYANOCOBALAMIN) 1000 MCG tablet Take 1,000 mcg by mouth daily.    . vitamin C (ASCORBIC ACID) 500 MG tablet Take 500 mg by mouth daily.    . vitamin E 400 UNIT capsule Take by mouth.     No current facility-administered medications for this visit.     Allergies:   Patient has no known allergies.  Social History:  The patient  reports that he quit smoking about 54 years ago. He smoked 0.50 packs per day. He has never used smokeless tobacco. He reports that he does not drink alcohol or use drugs.   Family History:  The patient's family history includes Cancer in his maternal grandfather, maternal grandmother, and mother; Heart disease in his father; Kidney failure in his sister.    ROS: All other systems are reviewed and negative. Unless otherwise mentioned in H&P    PHYSICAL EXAM: VS:  BP 130/70   Pulse (!) 55   Ht 5\' 7"  (1.702 m)   Wt 146 lb (66.2 kg)   BMI 22.87 kg/m  , BMI Body mass index is 22.87 kg/m. GEN: Well nourished, well developed, in no acute  distress  HEENT: normal  Neck: no JVD, carotid bruits, or masses Cardiac: DDU;2/0 systolic murmur heard at the left sternal border and apex no  rubs, or gallops,no edema  Respiratory:  Clear to auscultation bilaterally, normal work of breathing GI: soft, nontender, nondistended, + BS.  Left inguinal hernia is noted MS: no deformity or atrophy significant gait disturbance with walking Skin: warm and dry, no rash Neuro:  Strength and sensation are intact Psych: euthymic mood, full affect   EKG: Sinus bradycardia with first-degree AV block PR interval 256 ms, heart rate of 55 bpm.  Compared to prior EKG completed in February 2018, heart rate is lower and first-degree AV block is new   Recent Labs: No results found for requested labs within last 8760 hours.    Lipid Panel No results found for: CHOL, TRIG, HDL, CHOLHDL, VLDL, LDLCALC, LDLDIRECT    Wt Readings from Last 3 Encounters:  07/05/17 146 lb (66.2 kg)  08/30/16 145 lb 9.6 oz (66 kg)  05/23/16 147 lb 12.8 oz (67 kg)    Other studies Reviewed: Echocardiogram 03/13/2016. Left ventricle: The cavity size was normal. Wall thickness was   normal. Systolic function was normal. The estimated ejection   fraction was in the range of 60% to 65%. Wall motion was normal;   there were no regional wall motion abnormalities. Features are   consistent with a pseudonormal left ventricular filling pattern,   with concomitant abnormal relaxation and increased filling   pressure (grade 2 diastolic dysfunction). - Aortic valve: Moderately calcified annulus. Moderately thickened,   moderately calcified leaflets. There was moderate regurgitation. - Mitral valve: There was mild regurgitation. - Pulmonary arteries: Systolic pressure was moderately increased.   PA peak pressure: 44 mm Hg (S).  ASSESSMENT AND PLAN:  1.  Preoperative cardiac clearance: The patient is currently stable from a cardiac standpoint.  EKG is changed somewhat revealing more  bradycardia with a first-degree AV block which was not noted on previous EKG in February 2018.  He is asymptomatic and is not on any AV nodal blocking agents.  This will need to be monitored.  The patient is very sedentary and has become more so with his "post polio syndrome."  This will need to be followed.  This ready to be patient important to use it okay use failure-PR ATT contrast in the person chart reviewed as part of pre-operative protocol coverage. Given past medical history and time since last visit, based on ACC/AHA guidelines, Charles Nickson. would be at acceptable risk for the planned procedure without further cardiovascular testing.  This will be forwarded to Dr. Johney Maine so that surgery can be scheduled.  2.  Aortic valve calcification: Per echocardiogram on Mar 13, 2016  the patient had moderately calcified annulus, moderately thickened moderately calcified leaflets, with moderate regurgitation.  I am going to repeat his echocardiogram for ongoing surveillance.  He is currently asymptomatic.  3.  Hypertension: Blood pressures currently well controlled on medication regimen.  Would not make any changes perioperatively.  Due to diastolic dysfunction, would avoid overhydration perioperatively to prevent fluid overload.  Medications are discussed at length with the patient today.    Labs/ tests ordered today include: Repeat echocardiogram  Phill Myron. West Pugh, ANP, AACC   07/05/2017 10:44 AM    [place] [address], [place], [place] [address] Phone: [phone]; Fax: [phone]

## 2017-07-05 ENCOUNTER — Ambulatory Visit (INDEPENDENT_AMBULATORY_CARE_PROVIDER_SITE_OTHER): Payer: Medicare Other | Admitting: Adult Health

## 2017-07-05 ENCOUNTER — Encounter: Payer: Self-pay | Admitting: Adult Health

## 2017-07-05 VITALS — BP 130/70 | HR 55 | Ht 67.0 in | Wt 146.0 lb

## 2017-07-05 DIAGNOSIS — Z01810 Encounter for preprocedural cardiovascular examination: Secondary | ICD-10-CM | POA: Diagnosis not present

## 2017-07-05 DIAGNOSIS — I359 Nonrheumatic aortic valve disorder, unspecified: Secondary | ICD-10-CM

## 2017-07-05 DIAGNOSIS — I440 Atrioventricular block, first degree: Secondary | ICD-10-CM

## 2017-07-05 DIAGNOSIS — I10 Essential (primary) hypertension: Secondary | ICD-10-CM | POA: Diagnosis not present

## 2017-07-05 NOTE — Telephone Encounter (Signed)
Patient seen in the office today and cleared for surgery by DNP Jory Sims. Office note sent to Dr Johney Maine via [employer]

## 2017-07-05 NOTE — Patient Instructions (Signed)
Medication Instructions:  Your physician recommends that you continue on your current medications as directed. Please refer to the Current Medication list given to you today.  Labwork: NONE  Testing/Procedures: Your physician has requested that you have an echocardiogram. Echocardiography is a painless test that uses sound waves to create images of your heart. It provides your doctor with information about the size and shape of your heart and how well your heart's chambers and valves are working. This procedure takes approximately one hour. There are no restrictions for this procedure. WITHIN THE NEXT 3 MONTHS [facility] [place] [address]  Follow-Up: Your physician recommends that you schedule a follow-up appointment in: DR CROITORU WITHIN THE NEXT 6 MONTHS   Any Other Special Instructions Will Be Listed Below (If Applicable). YOU ARE CLEARED FROM A CARDIAC STANDPOINT FOR YOUR UPCOMING HERNIA SURGERY, DR GROSS WILL BE NOTIFIED   If you need a refill on your cardiac medications before your next appointment, please call your pharmacy.

## 2017-07-05 NOTE — Progress Notes (Signed)
Thanks, I agree: surgical risk is acceptable MCr

## 2017-08-09 NOTE — Patient Instructions (Addendum)
Charles Randall.  08/09/2017   Your procedure is scheduled on: 08-17-17   Report to [facility] Main  Entrance    Report to Admitting at 9:30 AM    Call this number if you have problems the morning of surgery [phone]    Remember: NO SOLID FOOD AFTER MIDNIGHT THE NIGHT PRIOR TO SURGERY. NOTHING BY MOUTH EXCEPT CLEAR LIQUIDS UNTIL 3 HOURS PRIOR TO [place] SURGERY. PLEASE FINISH ENSURE DRINK PER SURGEON ORDER 3 HOURS PRIOR TO SCHEDULED SURGERY TIME WHICH NEEDS TO BE COMPLETED AT 8:30 AM  CLEAR LIQUID DIET   Foods Allowed                                                                     Foods Excluded  Coffee and tea, regular and decaf                             liquids that you cannot  Plain Jell-O in any flavor                                             see through such as: Fruit ices (not with fruit pulp)                                     milk, soups, orange juice  Iced Popsicles                                    All solid food Carbonated beverages, regular and diet                                    Cranberry, grape and apple juices Sports drinks like Gatorade Lightly seasoned clear broth or consume(fat free) Sugar, honey syrup  Sample Menu Breakfast                                Lunch                                     Supper Cranberry juice                    Beef broth                            Chicken broth Jell-O                                     Grape juice  Apple juice Coffee or tea                        Jell-O                                      Popsicle                                                Coffee or tea                        Coffee or tea  _____________________________________________________________________    Take these medicines the morning of surgery with A SIP OF WATER: Levetiacetam (Keppra). You can also bring and use your eyedrops as needed.                                You may  not have any metal on your body including hair pins and              piercings  Do not wear jewelry, make-up, lotions, powders or perfumes, deodorant             Men may shave face and neck.   Do not bring valuables to the hospital. [place].  Contacts, dentures or bridgework may not be worn into surgery.  Leave suitcase in the car. After surgery it may be brought to your room.     Patients discharged the day of surgery will not be allowed to drive home.  Name and phone number of your driver:  Special Instructions: N/A              Please read over the following fact sheets you were given: _____________________________________________________________________             [facility] - Preparing for Surgery Before surgery, you can play an important role.  Because skin is not sterile, your skin needs to be as free of germs as possible.  You can reduce the number of germs on your skin by washing with CHG (chlorahexidine gluconate) soap before surgery.  CHG is an antiseptic cleaner which kills germs and bonds with the skin to continue killing germs even after washing. Please DO NOT use if you have an allergy to CHG or antibacterial soaps.  If your skin becomes reddened/irritated stop using the CHG and inform your nurse when you arrive at Short Stay. Do not shave (including legs and underarms) for at least 48 hours prior to the first CHG shower.  You may shave your face/neck. Please follow these instructions carefully:  1.  Shower with CHG Soap the night before surgery and the  morning of Surgery.  2.  If you choose to wash your hair, wash your hair first as usual with your  normal  shampoo.  3.  After you shampoo, rinse your hair and body thoroughly to remove the  shampoo.                           4.  Use CHG as you would any other liquid soap.  You can apply chg directly  to the skin and wash                       Gently with a scrungie or  clean washcloth.  5.  Apply the CHG Soap to your body ONLY FROM THE NECK DOWN.   Do not use on face/ open                           Wound or open sores. Avoid contact with eyes, ears mouth and genitals (private parts).                       Wash face,  Genitals (private parts) with your normal soap.             6.  Wash thoroughly, paying special attention to the area where your surgery  will be performed.  7.  Thoroughly rinse your body with warm water from the neck down.  8.  DO NOT shower/wash with your normal soap after using and rinsing off  the CHG Soap.                9.  Pat yourself dry with a clean towel.            10.  Wear clean pajamas.            11.  Place clean sheets on your bed the night of your first shower and do not  sleep with pets. Day of Surgery : Do not apply any lotions/deodorants the morning of surgery.  Please wear clean clothes to the hospital/surgery center.  FAILURE TO FOLLOW THESE INSTRUCTIONS MAY RESULT IN THE CANCELLATION OF YOUR SURGERY PATIENT SIGNATURE_________________________________  NURSE SIGNATURE__________________________________  ________________________________________________________________________

## 2017-08-09 NOTE — Progress Notes (Signed)
07-05-17, (Epic) EKG & Cardiac clearance from [facility]  03-04-16 (Epic) ECHO

## 2017-08-10 ENCOUNTER — Encounter (HOSPITAL_COMMUNITY): Payer: Self-pay

## 2017-08-10 ENCOUNTER — Other Ambulatory Visit: Payer: Self-pay

## 2017-08-10 ENCOUNTER — Encounter (HOSPITAL_COMMUNITY)
Admission: RE | Admit: 2017-08-10 | Discharge: 2017-08-10 | Disposition: A | Discharge status: Home / Self Care | Payer: Medicare Other | Source: Ambulatory Visit | Attending: Surgery | Admitting: Surgery

## 2017-08-10 DIAGNOSIS — K4020 Bilateral inguinal hernia, without obstruction or gangrene, not specified as recurrent: Secondary | ICD-10-CM | POA: Diagnosis not present

## 2017-08-10 DIAGNOSIS — Z01812 Encounter for preprocedural laboratory examination: Secondary | ICD-10-CM | POA: Diagnosis not present

## 2017-08-10 HISTORY — DX: Unilateral inguinal hernia, without obstruction or gangrene, not specified as recurrent: K40.90

## 2017-08-10 HISTORY — DX: Unspecified osteoarthritis, unspecified site: M19.90

## 2017-08-10 HISTORY — DX: Pneumonia, unspecified organism: J18.9

## 2017-08-10 HISTORY — DX: Cough: R05

## 2017-08-10 HISTORY — DX: Hemorrhagic condition, unspecified: D69.9

## 2017-08-10 HISTORY — DX: Postpolio syndrome: G14

## 2017-08-10 HISTORY — DX: Anemia, unspecified: D64.9

## 2017-08-10 HISTORY — DX: Other specified soft tissue disorders: M79.89

## 2017-08-10 HISTORY — DX: Personal history of other diseases of the digestive system: Z87.19

## 2017-08-10 HISTORY — DX: Cough, unspecified: R05.9

## 2017-08-10 HISTORY — DX: Cardiac murmur, unspecified: R01.1

## 2017-08-10 LAB — BASIC METABOLIC PANEL
ANION GAP: 7 (ref 5–15)
BUN: 32 mg/dL — ABNORMAL HIGH (ref 6–20)
CO2: 29 mmol/L (ref 22–32)
Calcium: 9 mg/dL (ref 8.9–10.3)
Chloride: 104 mmol/L (ref 101–111)
Creatinine, Ser: 1.54 mg/dL — ABNORMAL HIGH (ref 0.61–1.24)
GFR, EST AFRICAN AMERICAN: 45 mL/min — AB (ref >60)
GFR, EST NON AFRICAN AMERICAN: 39 mL/min — AB (ref >60)
GLUCOSE: 100 mg/dL — AB (ref 65–99)
Potassium: 4.1 mmol/L (ref 3.5–5.1)
Sodium: 140 mmol/L (ref 135–145)

## 2017-08-10 LAB — CBC
HCT: 37.6 % — ABNORMAL LOW (ref 39.0–52.0)
Hemoglobin: 12.3 g/dL — ABNORMAL LOW (ref 13.0–17.0)
MCH: 29.5 pg (ref 26.0–34.0)
MCHC: 32.7 g/dL (ref 30.0–36.0)
MCV: 90.2 fL (ref 78.0–100.0)
PLATELETS: 307 10*3/uL (ref 150–400)
RBC: 4.17 MIL/uL — AB (ref 4.22–5.81)
RDW: 13.1 % (ref 11.5–15.5)
WBC: 8.1 10*3/uL (ref 4.0–10.5)

## 2017-08-10 NOTE — Progress Notes (Signed)
CBC and BMET dated 08-10-2017 sent to dr gross in epic.

## 2017-08-17 ENCOUNTER — Ambulatory Visit (HOSPITAL_COMMUNITY): Payer: Medicare Other | Admitting: Certified Registered Nurse Anesthetist

## 2017-08-17 ENCOUNTER — Encounter (HOSPITAL_COMMUNITY)
Admission: RE | Disposition: A | Discharge status: Home / Self Care | Payer: Self-pay | Source: Ambulatory Visit | Attending: Surgery

## 2017-08-17 ENCOUNTER — Encounter (HOSPITAL_COMMUNITY): Payer: Self-pay | Admitting: Certified Registered Nurse Anesthetist

## 2017-08-17 ENCOUNTER — Ambulatory Visit (HOSPITAL_COMMUNITY)
Admission: RE | Admit: 2017-08-17 | Discharge: 2017-08-17 | Disposition: A | Discharge status: Home / Self Care | Payer: Medicare Other | Source: Ambulatory Visit | Attending: Surgery | Admitting: Surgery

## 2017-08-17 DIAGNOSIS — D176 Benign lipomatous neoplasm of spermatic cord: Secondary | ICD-10-CM | POA: Insufficient documentation

## 2017-08-17 DIAGNOSIS — G40909 Epilepsy, unspecified, not intractable, without status epilepticus: Secondary | ICD-10-CM | POA: Insufficient documentation

## 2017-08-17 DIAGNOSIS — D649 Anemia, unspecified: Secondary | ICD-10-CM | POA: Insufficient documentation

## 2017-08-17 DIAGNOSIS — K4190 Unilateral femoral hernia, without obstruction or gangrene, not specified as recurrent: Secondary | ICD-10-CM | POA: Diagnosis not present

## 2017-08-17 DIAGNOSIS — Z7982 Long term (current) use of aspirin: Secondary | ICD-10-CM | POA: Insufficient documentation

## 2017-08-17 DIAGNOSIS — Z79899 Other long term (current) drug therapy: Secondary | ICD-10-CM | POA: Diagnosis not present

## 2017-08-17 DIAGNOSIS — Z87891 Personal history of nicotine dependence: Secondary | ICD-10-CM | POA: Diagnosis not present

## 2017-08-17 DIAGNOSIS — K4030 Unilateral inguinal hernia, with obstruction, without gangrene, not specified as recurrent: Secondary | ICD-10-CM | POA: Insufficient documentation

## 2017-08-17 DIAGNOSIS — K4090 Unilateral inguinal hernia, without obstruction or gangrene, not specified as recurrent: Secondary | ICD-10-CM | POA: Insufficient documentation

## 2017-08-17 DIAGNOSIS — K4020 Bilateral inguinal hernia, without obstruction or gangrene, not specified as recurrent: Secondary | ICD-10-CM

## 2017-08-17 DIAGNOSIS — I10 Essential (primary) hypertension: Secondary | ICD-10-CM | POA: Diagnosis not present

## 2017-08-17 HISTORY — PX: FEMORAL HERNIA REPAIR: SHX632

## 2017-08-17 HISTORY — PX: INGUINAL HERNIA REPAIR: SHX194

## 2017-08-17 HISTORY — PX: INSERTION OF MESH: SHX5868

## 2017-08-17 SURGERY — REPAIR, HERNIA, INGUINAL, BILATERAL, LAPAROSCOPIC
Anesthesia: General | Site: Inguinal | Laterality: Right

## 2017-08-17 MED ORDER — MEPERIDINE HCL 50 MG/ML IJ SOLN: 6.2500 mg | INTRAMUSCULAR | Status: DC | PRN

## 2017-08-17 MED ORDER — ONDANSETRON HCL 4 MG/2ML IJ SOLN
INTRAMUSCULAR | Status: AC
  Filled 2017-08-17: qty 2

## 2017-08-17 MED ORDER — EPHEDRINE 5 MG/ML INJ
INTRAVENOUS | Status: AC
  Filled 2017-08-17: qty 10

## 2017-08-17 MED ORDER — LIDOCAINE 2% (20 MG/ML) 5 ML SYRINGE
INTRAMUSCULAR | Status: DC | PRN
  Administered 2017-08-17: 1.5 mg/kg/h via INTRAVENOUS

## 2017-08-17 MED ORDER — SUGAMMADEX SODIUM 500 MG/5ML IV SOLN
INTRAVENOUS | Status: AC
  Filled 2017-08-17: qty 5

## 2017-08-17 MED ORDER — PHENYLEPHRINE 40 MCG/ML (10ML) SYRINGE FOR IV PUSH (FOR BLOOD PRESSURE SUPPORT)
PREFILLED_SYRINGE | INTRAVENOUS | Status: DC | PRN
  Administered 2017-08-17: 80 ug via INTRAVENOUS

## 2017-08-17 MED ORDER — ACETAMINOPHEN 650 MG RE SUPP
650.0000 mg | RECTAL | Status: DC | PRN
  Filled 2017-08-17: qty 1

## 2017-08-17 MED ORDER — FENTANYL CITRATE (PF) 250 MCG/5ML IJ SOLN
INTRAMUSCULAR | Status: DC | PRN
  Administered 2017-08-17 (×3): 50 ug via INTRAVENOUS

## 2017-08-17 MED ORDER — SODIUM CHLORIDE 0.9 % IV SOLN: 250.0000 mL | INTRAVENOUS | Status: DC | PRN

## 2017-08-17 MED ORDER — ROCURONIUM BROMIDE 10 MG/ML (PF) SYRINGE
PREFILLED_SYRINGE | INTRAVENOUS | Status: DC | PRN
  Administered 2017-08-17: 50 mg via INTRAVENOUS

## 2017-08-17 MED ORDER — LACTATED RINGERS IV SOLN
INTRAVENOUS | Status: DC
  Administered 2017-08-17: 10:00:00 via INTRAVENOUS

## 2017-08-17 MED ORDER — BUPIVACAINE-EPINEPHRINE (PF) 0.25% -1:200000 IJ SOLN
INTRAMUSCULAR | Status: AC
  Filled 2017-08-17: qty 30

## 2017-08-17 MED ORDER — 0.9 % SODIUM CHLORIDE (POUR BTL) OPTIME
TOPICAL | Status: DC | PRN
  Administered 2017-08-17: 1000 mL

## 2017-08-17 MED ORDER — LIDOCAINE 2% (20 MG/ML) 5 ML SYRINGE
INTRAMUSCULAR | Status: DC | PRN
  Administered 2017-08-17: 40 mg via INTRAVENOUS

## 2017-08-17 MED ORDER — BUPIVACAINE-EPINEPHRINE 0.25% -1:200000 IJ SOLN
INTRAMUSCULAR | Status: DC | PRN
  Administered 2017-08-17: 40 mL

## 2017-08-17 MED ORDER — FENTANYL CITRATE (PF) 250 MCG/5ML IJ SOLN
INTRAMUSCULAR | Status: AC
Start: 2017-08-17 — End: ?
  Filled 2017-08-17: qty 5

## 2017-08-17 MED ORDER — MIDAZOLAM HCL 2 MG/2ML IJ SOLN
INTRAMUSCULAR | Status: AC
  Filled 2017-08-17: qty 2

## 2017-08-17 MED ORDER — SUGAMMADEX SODIUM 200 MG/2ML IV SOLN
INTRAVENOUS | Status: DC | PRN
  Administered 2017-08-17: 300 mg via INTRAVENOUS

## 2017-08-17 MED ORDER — DEXAMETHASONE SODIUM PHOSPHATE 10 MG/ML IJ SOLN
INTRAMUSCULAR | Status: DC | PRN
  Administered 2017-08-17: 10 mg via INTRAVENOUS

## 2017-08-17 MED ORDER — FENTANYL CITRATE (PF) 100 MCG/2ML IJ SOLN: 25.0000 ug | INTRAMUSCULAR | Status: DC | PRN

## 2017-08-17 MED ORDER — ACETAMINOPHEN 500 MG PO TABS
1000.0000 mg | ORAL_TABLET | ORAL | Status: AC
  Administered 2017-08-17: 1000 mg via ORAL
  Filled 2017-08-17: qty 2

## 2017-08-17 MED ORDER — LABETALOL HCL 5 MG/ML IV SOLN
INTRAVENOUS | Status: DC | PRN
  Administered 2017-08-17: 5 mg via INTRAVENOUS

## 2017-08-17 MED ORDER — MIDAZOLAM HCL 5 MG/5ML IJ SOLN
INTRAMUSCULAR | Status: DC | PRN
  Administered 2017-08-17 (×2): 1 mg via INTRAVENOUS

## 2017-08-17 MED ORDER — ROCURONIUM BROMIDE 100 MG/10ML IV SOLN
INTRAVENOUS | Status: AC
  Filled 2017-08-17: qty 1

## 2017-08-17 MED ORDER — CHLORHEXIDINE GLUCONATE CLOTH 2 % EX PADS: 6.0000 | MEDICATED_PAD | Freq: Once | CUTANEOUS | Status: DC

## 2017-08-17 MED ORDER — CEFAZOLIN SODIUM-DEXTROSE 2-4 GM/100ML-% IV SOLN
2.0000 g | INTRAVENOUS | Status: AC
  Administered 2017-08-17: 2 g via INTRAVENOUS
  Filled 2017-08-17: qty 100

## 2017-08-17 MED ORDER — TRAMADOL HCL 50 MG PO TABS: 50.0000 mg | ORAL_TABLET | Freq: Four times a day (QID) | ORAL | 0 refills | Status: DC | PRN

## 2017-08-17 MED ORDER — LABETALOL HCL 5 MG/ML IV SOLN
INTRAVENOUS | Status: AC
  Filled 2017-08-17: qty 4

## 2017-08-17 MED ORDER — ONDANSETRON HCL 4 MG/2ML IJ SOLN
INTRAMUSCULAR | Status: DC | PRN
  Administered 2017-08-17: 4 mg via INTRAVENOUS

## 2017-08-17 MED ORDER — LIDOCAINE 2% (20 MG/ML) 5 ML SYRINGE
INTRAMUSCULAR | Status: AC
  Filled 2017-08-17: qty 5

## 2017-08-17 MED ORDER — SODIUM CHLORIDE 0.9% FLUSH: 3.0000 mL | INTRAVENOUS | Status: DC | PRN

## 2017-08-17 MED ORDER — EPHEDRINE SULFATE-NACL 50-0.9 MG/10ML-% IV SOSY
PREFILLED_SYRINGE | INTRAVENOUS | Status: DC | PRN
  Administered 2017-08-17: 10 mg via INTRAVENOUS
  Administered 2017-08-17 (×2): 5 mg via INTRAVENOUS

## 2017-08-17 MED ORDER — PHENYLEPHRINE 40 MCG/ML (10ML) SYRINGE FOR IV PUSH (FOR BLOOD PRESSURE SUPPORT)
PREFILLED_SYRINGE | INTRAVENOUS | Status: AC
Start: 2017-08-17 — End: ?
  Filled 2017-08-17: qty 10

## 2017-08-17 MED ORDER — OXYCODONE HCL 5 MG PO TABS: 5.0000 mg | ORAL_TABLET | ORAL | Status: DC | PRN

## 2017-08-17 MED ORDER — GABAPENTIN 300 MG PO CAPS
300.0000 mg | ORAL_CAPSULE | ORAL | Status: AC
  Administered 2017-08-17: 300 mg via ORAL
  Filled 2017-08-17: qty 1

## 2017-08-17 MED ORDER — PROPOFOL 10 MG/ML IV BOLUS
INTRAVENOUS | Status: DC | PRN
Start: 2017-08-17 — End: 2017-08-17
  Administered 2017-08-17: 120 mg via INTRAVENOUS

## 2017-08-17 MED ORDER — SODIUM CHLORIDE 0.9% FLUSH: 3.0000 mL | Freq: Two times a day (BID) | INTRAVENOUS | Status: DC

## 2017-08-17 MED ORDER — ACETAMINOPHEN 325 MG PO TABS: 650.0000 mg | ORAL_TABLET | ORAL | Status: DC | PRN

## 2017-08-17 MED ORDER — STERILE WATER FOR IRRIGATION IR SOLN
Status: DC | PRN
  Administered 2017-08-17: 1000 mL

## 2017-08-17 MED ORDER — DEXAMETHASONE SODIUM PHOSPHATE 10 MG/ML IJ SOLN
INTRAMUSCULAR | Status: AC
  Filled 2017-08-17: qty 1

## 2017-08-17 SURGICAL SUPPLY — 34 items
CABLE HIGH FREQUENCY MONO STRZ (ELECTRODE) ×5 IMPLANT
CHLORAPREP W/TINT 26ML (MISCELLANEOUS) ×5 IMPLANT
COVER SURGICAL LIGHT HANDLE (MISCELLANEOUS) ×5 IMPLANT
DECANTER SPIKE VIAL GLASS SM (MISCELLANEOUS) ×5 IMPLANT
DEVICE SECURE STRAP 25 ABSORB (INSTRUMENTS) IMPLANT
DRAPE WARM FLUID 44X44 (DRAPE) ×5 IMPLANT
DRSG TEGADERM 2-3/8X2-3/4 SM (GAUZE/BANDAGES/DRESSINGS) ×5 IMPLANT
DRSG TEGADERM 4X4.75 (GAUZE/BANDAGES/DRESSINGS) ×5 IMPLANT
ELECT REM PT RETURN 15FT ADLT (MISCELLANEOUS) ×5 IMPLANT
GAUZE SPONGE 2X2 8PLY STRL LF (GAUZE/BANDAGES/DRESSINGS) ×3 IMPLANT
GLOVE ECLIPSE 8.0 STRL XLNG CF (GLOVE) ×5 IMPLANT
GLOVE INDICATOR 8.0 STRL GRN (GLOVE) ×5 IMPLANT
GOWN STRL REUS W/TWL XL LVL3 (GOWN DISPOSABLE) ×10 IMPLANT
IRRIG SUCT STRYKERFLOW 2 WTIP (MISCELLANEOUS)
IRRIGATION SUCT STRKRFLW 2 WTP (MISCELLANEOUS) IMPLANT
KIT BASIN OR (CUSTOM PROCEDURE TRAY) ×5 IMPLANT
MESH ULTRAPRO 6X6 15CM15CM (Mesh General) ×10 IMPLANT
NEEDLE INSUFFLATION 14GA 120MM (NEEDLE) IMPLANT
PAD POSITIONING PINK XL (MISCELLANEOUS) ×5 IMPLANT
SCISSORS LAP 5X35 DISP (ENDOMECHANICALS) ×5 IMPLANT
SLEEVE ADV FIXATION 5X100MM (TROCAR) ×5 IMPLANT
SPONGE GAUZE 2X2 STER 10/PKG (GAUZE/BANDAGES/DRESSINGS) ×2
SUT MNCRL AB 4-0 PS2 18 (SUTURE) ×5 IMPLANT
SUT PDS AB 1 CT1 27 (SUTURE) ×5 IMPLANT
SUT VIC AB 2-0 SH 27 (SUTURE) ×8
SUT VIC AB 2-0 SH 27X BRD (SUTURE) ×6 IMPLANT
SUT VICRYL 0 UR6 27IN ABS (SUTURE) ×5 IMPLANT
TACKER 5MM HERNIA 3.5CML NAB (ENDOMECHANICALS) IMPLANT
TOWEL OR 17X26 10 PK STRL BLUE (TOWEL DISPOSABLE) ×5 IMPLANT
TOWEL OR NON WOVEN STRL DISP B (DISPOSABLE) ×5 IMPLANT
TRAY LAPAROSCOPIC (CUSTOM PROCEDURE TRAY) ×5 IMPLANT
TROCAR ADV FIXATION 5X100MM (TROCAR) ×5 IMPLANT
TROCAR XCEL BLUNT TIP 100MML (ENDOMECHANICALS) ×5 IMPLANT
TUBING INSUF HEATED (TUBING) ×5 IMPLANT

## 2017-08-17 NOTE — Interval H&P Note (Signed)
History and Physical Interval Note:  08/17/2017 11:56 AM  Charles Randall.  has presented today for surgery, with the diagnosis of inguinal hernias in both groins  The various methods of treatment have been discussed with the patient and family. After consideration of risks, benefits and other options for treatment, the patient has consented to  Procedure(s): [place] (Bilateral) INSERTION OF MESH (Bilateral) as a surgical intervention .  The patient's history has been reviewed, patient examined, no change in status, stable for surgery.  I have reviewed the patient's chart and labs.  Questions were answered to the patient's satisfaction.    I have re-reviewed the the patient's records, history, medications, and allergies.  I have re-examined the patient.  I again discussed intraoperative plans and goals of post-operative recovery.  The patient agrees to proceed.  Charles Randall.  Nov 18, 1927 [id number]  Patient Care Team: Nickola Major, MD as PCP - General (Family Medicine) Croitoru, Dani Gobble, MD as PCP - Cardiology (Cardiology) Michael Boston, MD as Consulting Physician (General Surgery) Nolon Lennert, MD as Referring Physician (Orthopedic Surgery) Penni Bombard, MD as Consulting Physician (Neurology)  Patient Active Problem List   Diagnosis Date Noted  . Partial symptomatic epilepsy with complex partial seizures, not intractable, without status epilepticus ([place]) 11/16/2015  . Seizure disorder ([place]) 05/12/2015  . Edema of lower extremity 04/09/2015  . Benign essential hypertension 04/09/2015  . Pleural effusion, right 04/09/2015  . 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 Anemia    STARTED PILLS 1 MONTH AGO NEW DX  . Aortic insufficiency    mild, moderate  . Arthritis    FINGERS SWELL PT THINKS ARTHITIS, TOP OF WRIST ALSO SWELLS ONCE IN A WHILE  . Bleeds easily ([place])    BRUISE EASY ALSO  . Cough     LAST 3 TO 4 WEEKS PHLEGM YELLOW INTERMITTENT COUGH NO FEVER  . Dyslipidemia   . H/O dizziness    OCC   . H/O Doppler ultrasound 2010  . H/O echocardiogram 2013   aortic valve disorder, EF =>55%  . H/O echocardiogram 2012   AVD, EF =>55%  . Heart murmur   . History of hiatal hernia   . HTN (hypertension)   . Inguinal hernia   . Mitral regurgitation    mild  . Pneumonia FEW YEARS AGO   X 1  . Post-polio syndrome age 66  . Seizures ([place])    last sz 2016 NO CAUSE FOUND   . Swelling of both lower extremities    DOES NOT ALWAYS DO DOWN   . Tricuspid regurgitation    mild    Past Surgical History:  Procedure Laterality Date  . COLONSCOPY  5 YRS AGO  . DENTAL SURGERY  3-4 WEEKS AGO    Social History   Socioeconomic History  . Marital status: Unknown    Spouse name: Not on file  . Number of children: 2  . Years of education: BA [employer]  . Highest education level: Not on file  Occupational History  . Occupation: Retired  Scientific laboratory technician  . Financial resource strain: Not on file  . Food insecurity:    Worry: Not on file    Inability: Not on file  . Transportation needs:    Medical: Not on file    Non-medical: Not on file  Tobacco Use  . Smoking status: Former Smoker    Packs/day: 0.50    Years:  15.00    Pack years: 7.50    Last attempt to quit: 03/01/1963    Years since quitting: 54.5  . Smokeless tobacco: Never Used  Substance and Sexual Activity  . Alcohol use: No  . Drug use: No  . Sexual activity: Not on file  Lifestyle  . Physical activity:    Days per week: Not on file    Minutes per session: Not on file  . Stress: Not on file  Relationships  . Social connections:    Talks on phone: Not on file    Gets together: Not on file    Attends religious service: Not on file    Active member of club or organization: Not on file    Attends meetings of clubs or organizations: Not on file    Relationship status: Not on file  . Intimate partner violence:    Fear of  current or ex partner: Not on file    Emotionally abused: Not on file    Physically abused: Not on file    Forced sexual activity: Not on file  Other Topics Concern  . Not on file  Social History Narrative   84 year old right-handed male who lives at home alone.  He is single (has long-term girlfriend) and has two children.    He drinks one cup caffeinated coffee daily.    Family History  Problem Relation Age of Onset  . Cancer Mother   . Heart disease Father   . Cancer Maternal Grandmother   . Cancer Maternal Grandfather   . Kidney failure Sister     Medications Prior to Admission  Medication Sig Dispense Refill Last Dose  . Ascorbic Acid (VITAMIN C) 1000 MG tablet Take 1,000 mg by mouth daily.   08/16/2017 at Unknown time  . Cholecalciferol (VITAMIN D-3) 1000 UNITS CAPS Take 1,000 Units by mouth daily.    08/16/2017 at Unknown time  . ferrous sulfate 325 (65 FE) MG EC tablet Take 325 mg by mouth daily.  1 08/16/2017 at Unknown time  . levETIRAcetam (KEPPRA) 750 MG tablet Take 2 tablets (1,500 mg total) by mouth 2 (two) times daily. 360 tablet 4 08/17/2017 at 0730  . lisinopril-hydrochlorothiazide (PRINZIDE,ZESTORETIC) 20-25 MG tablet Take 1 tablet by mouth daily.   0 08/16/2017 at Unknown time  . Magnesium 250 MG TABS Take 250 mg by mouth daily.   08/16/2017 at Unknown time  . Omega-3 Fatty Acids (FISH OIL PO) Take 520 mg by mouth 2 (two) times daily.   08/16/2017 at Unknown time  . Polyethyl Glycol-Propyl Glycol (LUBRICANT EYE DROPS) 0.4-0.3 % SOLN Place 1 drop into both eyes 3 (three) times daily as needed (for dry/irritated eyes.).   Past Month at Unknown time  . vitamin B-12 (CYANOCOBALAMIN) 1000 MCG tablet Take 1,000 mcg by mouth daily.   08/16/2017 at Unknown time  . vitamin E 400 UNIT capsule Take 400 Units by mouth daily.    08/16/2017 at Unknown time    Current Facility-Administered Medications  Medication Dose Route Frequency Provider Last Rate Last Dose  . ceFAZolin (ANCEF)  IVPB 2g/100 mL premix  2 g Intravenous On Call to OR Michael Boston, MD      . Chlorhexidine Gluconate Cloth 2 % PADS 6 each  6 each Topical Once Michael Boston, MD       And  . Chlorhexidine Gluconate Cloth 2 % PADS 6 each  6 each Topical Once Michael Boston, MD      . lactated  ringers infusion   Intravenous Continuous Janeece Riggers, MD 50 mL/hr at 08/17/17 1020       No Known Allergies  BP 135/71   Pulse 65   Temp 97.7 F (36.5 C) (Oral)   Resp 18   Ht 5\' 7"  (1.702 m)   Wt 64.9 kg (143 lb)   SpO2 100%   BMI 22.40 kg/m   Labs: No results found for this or any previous visit (from the past 48 hour(s)).  Imaging / Studies: No results found.   Adin Hector, M.D., F.A.C.S. Gastrointestinal and Minimally Invasive Surgery [facility] [address] [place], [place] [address] [phone] Main / Paging  08/17/2017 11:56 AM     Adin Hector

## 2017-08-17 NOTE — H&P (Signed)
Charles Randall DOB: 07-Mar-1927  Patient returns for concerns of his inguinal hernias  Patient Care Team: Nickola Major, MD as PCP - General (Family Medicine) Croitoru, Dani Gobble, MD as PCP - Cardiology (Cardiology) Michael Boston, MD as Consulting Physician (General Surgery) Nolon Lennert, MD as Referring Physician (Orthopedic Surgery) Penni Bombard, MD as Consulting Physician (Neurology)   Pleasant elderly but active male. He returns one year later. I had seen him last March. He was complaining of a symptomatic left inguinal hernia. I thought he might have a right inguinal hernias well. Some chronic constipation. 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 I offered surgical repair since he was complaining of pain with activity and errands and yardwork.  He had what sounds like a mini stroke/TIA a few weeks later. Eventually discussed with primary care physician in neurology. Workup was underwhelming. No new issues in the year. He establish with a new primary care physician. There is discussion about reconsidering hernia surgery. He comes in to consider that.  He's not on blood thinners. He self stopped his aspirin. No fevers or chills. He tells me he doesn't have much pain or discomfort. However is wife had hernia surgery by me and she is worried about a becoming an emergency like hers was. He is leaning towards reconsidering surgery. He does have a post polio syndrome, so walks slowly but he can get around [place] relatively well. She keeps on his balance. No problems with urination or defecation. No cardiac issues. No lung she is. Has not smoked cigarettes in over 50 years. Episode of chest pain. His cardiologist was concerned. Workup proved that he had a pneumonia. Resolved after 10 days of antibiotics. Had an echocardiogram done in December 2017 which showed an ejection fraction around 60%.   While he is not  ecstatic to have surgery, he does feel like the groin was rather bothering him. Not so much now but feels like it is going to get it fixed, he wishes to get that fixed if possible.  (Review of systems as stated in this history (HPI) or in the review of systems. Otherwise all other 12 point ROS are negative) ` ` `   Problem List/Past Medical Adin Hector, MD; 06/05/2017 12:34 PM) BILATERAL INGUINAL HERNIA WITHOUT OBSTRUCTION OR GANGRENE, RECURRENCE NOT SPECIFIED (K40.20) PREOP - ING HERNIA - ENCOUNTER FOR PREOPERATIVE EXAMINATION FOR GENERAL SURGICAL PROCEDURE (Z01.818) CHRONIC CONSTIPATION (K59.09)  Past Surgical History Adin Hector, MD; 06/05/2017 12:34 PM) No pertinent past surgical history  Allergies Sabino Gasser; 06/05/2017 11:46 AM) No Known Drug Allergies [03/21/2016]: Allergies Reconciled  Medication History Sabino Gasser; 06/05/2017 11:46 AM) LevETIRAcetam (750MG  Tablet, Oral) Active. Aspirin (81MG  Tablet, Oral) Active. Vitamin D3 (1000UNIT Tablet, Oral) Active. Magnesium Oxide (400MG  Tablet, Oral) Active. Fish Oil (1000MG  Capsule, Oral) Active. Vitamin B12 (1000MCG Tablet ER, Oral) Active. Vitamin C (500MG  Tablet, Oral) Active. Vitamin E (400UNIT Capsule, Oral) Active. Medications Reconciled  Social History Adin Hector, MD; 06/05/2017 12:34 PM) Alcohol use Occasional alcohol use. Caffeine use Coffee. Tobacco use Former smoker.  Family History Adin Hector, MD; 06/05/2017 12:34 PM) Cervical Cancer Mother. Heart Disease Father.  Other Problems Adin Hector, MD; 06/05/2017 12:34 PM) Seizure Disorder     Review of Systems Adin Hector, MD; 06/05/2017 12:34 PM) General Present- Fatigue. Not Present- Appetite Loss, Chills, Fever, Night Sweats, Weight Gain and Weight Loss. Skin Present- Dryness and Non-Healing Wounds. Not Present- Change in Wart/Mole, Hives, Jaundice,  New Lesions, Rash and Ulcer. HEENT Present-  Ringing in the Ears, Sore Throat and Wears glasses/contact lenses. Not Present- Earache, Hearing Loss, Hoarseness, Nose Bleed, Oral Ulcers, Seasonal Allergies, Sinus Pain, Visual Disturbances and Yellow Eyes. Cardiovascular Present- Swelling of Extremities. Not Present- Chest Pain, Difficulty Breathing Lying Down, Leg Cramps, Palpitations, Rapid Heart Rate and Shortness of Breath. Gastrointestinal Present- Constipation and Difficulty Swallowing. Not Present- Abdominal Pain, Bloating, Bloody Stool, Change in Bowel Habits, Chronic diarrhea, Excessive gas, Gets full quickly at meals, Hemorrhoids, Indigestion, Nausea, Rectal Pain and Vomiting.  Vitals Sabino Gasser; 06/05/2017 11:47 AM) 06/05/2017 11:46 AM Weight: 149.38 lb Height: 67in Body Surface Area: 1.79 m Body Mass Index: 23.4 kg/m  Temp.: 97.64F(Oral)  Pulse: 65 (Regular)  BP: 124/70 (Sitting, Left Arm, Standard)      Physical Exam Adin Hector MD; 06/05/2017 11:54 AM)  General Mental Status-Alert. General Appearance-Not in acute distress, Not Sickly. Orientation-Oriented X3. Hydration-Well hydrated. Voice-Normal.  Integumentary Global Assessment Upon inspection and palpation of skin surfaces of the - Axillae: non-tender, no inflammation or ulceration, no drainage. and Distribution of scalp and body hair is normal. General Characteristics Temperature - normal warmth is noted.  Head and Neck Head-normocephalic, atraumatic with no lesions or palpable masses. Face Global Assessment - atraumatic, no absence of expression. Neck Global Assessment - no abnormal movements, no bruit auscultated on the right, no bruit auscultated on the left, no decreased range of motion, non-tender. Trachea-midline. Thyroid Gland Characteristics - non-tender.  Eye Eyeball - Left-Extraocular movements intact, No Nystagmus. Eyeball - Right-Extraocular movements intact, No Nystagmus. Cornea - Left-No  Hazy. Cornea - Right-No Hazy. Sclera/Conjunctiva - Left-No scleral icterus, No Discharge. Sclera/Conjunctiva - Right-No scleral icterus, No Discharge. Pupil - Left-Direct reaction to light normal. Pupil - Right-Direct reaction to light normal.  ENMT Ears Pinna - Left - no drainage observed, no generalized tenderness observed. Right - no drainage observed, no generalized tenderness observed. Nose and Sinuses External Inspection of the Nose - no destructive lesion observed. Inspection of the nares - Left - quiet respiration. Right - quiet respiration. Mouth and Throat Lips - Upper Lip - no fissures observed, no pallor noted. Lower Lip - no fissures observed, no pallor noted. Nasopharynx - no discharge present. Oral Cavity/Oropharynx - Tongue - no dryness observed. Oral Mucosa - no cyanosis observed. Hypopharynx - no evidence of airway distress observed.  Chest and Lung Exam Inspection Movements - Normal and Symmetrical. Accessory muscles - No use of accessory muscles in breathing. Palpation Palpation of the chest reveals - Non-tender. Auscultation Breath sounds - Normal and Clear.  Cardiovascular Auscultation Rhythm - Regular. Murmurs & Other Heart Sounds - Auscultation of the heart reveals - No Murmurs and No Systolic Clicks.  Abdomen Inspection Inspection of the abdomen reveals - No Visible peristalsis and No Abnormal pulsations. Umbilicus - No Bleeding, No Urine drainage. Palpation/Percussion Palpation and Percussion of the abdomen reveal - Soft, Non Tender, No Rebound tenderness, No Rigidity (guarding) and No Cutaneous hyperesthesia. Note: Abdomen soft. Nontender. Not distended. No umbilical or incisional hernias. No guarding.  Male Genitourinary Sexual Maturity Tanner 5 - Adult hair pattern and Adult penile size and shape.  Obvious left groin bulging.  Sensitive but reducible inguinal hernia.  Impulse on right side concerning for inguinal hernias well.   Otherwise normal external male genitalia.  Mild testicular atrophy.  No masses.  Peripheral Vascular Upper Extremity Inspection - Left - No Cyanotic nailbeds, Not Ischemic. Right - No Cyanotic nailbeds, Not Ischemic.  Neurologic Neurologic evaluation  reveals -normal attention span and ability to concentrate, able to name objects and repeat phrases. Appropriate fund of knowledge , normal sensation and normal coordination. Mental Status Affect - not angry, not paranoid. Cranial Nerves-Normal Bilaterally. Gait-Normal.  Neuropsychiatric Mental status exam performed with findings of-able to articulate well with normal speech/language, rate, volume and coherence, thought content normal with ability to perform basic computations and apply abstract reasoning and no evidence of hallucinations, delusions, obsessions or homicidal/suicidal ideation.  Musculoskeletal Global Assessment Spine, Ribs and Pelvis - no instability, subluxation or laxity. Right Upper Extremity - no instability, subluxation or laxity.  Lymphatic Head & Neck  General Head & Neck Lymphatics: Bilateral - Description - No Localized lymphadenopathy. Axillary  General Axillary Region: Bilateral - Description - No Localized lymphadenopathy. Femoral & Inguinal  Generalized Femoral & Inguinal Lymphatics: Left - Description - No Localized lymphadenopathy. Right - Description - No Localized lymphadenopathy.      BILATERAL INGUINAL HERNIA WITHOUT OBSTRUCTION OR GANGRENE, RECURRENCE NOT SPECIFIED (K40.20) Impression: Obvious left groin and probable small right inguinal hernia. I think he would benefit from hernia repair.  His advanced age, he is relatively active. He does not like how it hurts and affects his ability to get around in his quality of life. They wish to be aggressive & proceed.  Cleared by Dr. Tommie Raymond. Echocardiogram showed a good ejection fraction around 60%. He did have an episode of chest  pain that turned out to be a pneumonia that resolved with antibiotics. No real edema right now. His operative risks are increased, but not prohibitive in my mind.   The anatomy & physiology of the abdominal wall and pelvic floor was discussed. The pathophysiology of hernias in the inguinal and pelvic region was discussed. Natural history risks such as progressive enlargement, pain, incarceration, and strangulation was discussed. Contributors to complications such as smoking, obesity, diabetes, prior surgery, etc were discussed.  I feel the risks of no intervention will lead to serious problems that outweigh the operative risks; therefore, I recommended surgery to reduce and repair the hernia. I explained laparoscopic techniques with possible need for an open approach. I noted usual use of mesh to patch and/or buttress hernia repair  Risks such as bleeding, infection, abscess, need for further treatment, heart attack, death, and other risks were discussed. I noted a good likelihood this will help address the problem. Goals of post-operative recovery were discussed as well. Possibility that this will not correct all symptoms was explained. I stressed the importance of low-impact activity, aggressive pain control, avoiding constipation, & not pushing through pain to minimize risk of post-operative chronic pain or injury. Possibility of reherniation was discussed. We will work to minimize complications.  Adin Hector, MD, FACS, MASCRS Gastrointestinal and Minimally Invasive Surgery    [address], [place], [place] [address] [phone] Main / Paging [phone] Fax

## 2017-08-17 NOTE — Discharge Instructions (Signed)
HERNIA REPAIR: POST OP INSTRUCTIONS ° °###################################################################### ° °EAT °Gradually transition to a high fiber diet with a fiber supplement over the next few weeks after discharge.  Start with a pureed / full liquid diet (see below) ° °WALK °Walk an hour a day.  Control your pain to do that.   ° °CONTROL PAIN °Control pain so that you can walk, sleep, tolerate sneezing/coughing, and go up/down stairs. ° °HAVE A BOWEL MOVEMENT DAILY °Keep your bowels regular to avoid problems.  OK to try a laxative to override constipation.  OK to use an antidairrheal to slow down diarrhea.  Call if not better after 2 tries ° °CALL IF YOU HAVE PROBLEMS/CONCERNS °Call if you are still struggling despite following these instructions. °Call if you have concerns not answered by these instructions ° °###################################################################### ° ° ° °1. DIET: Follow a light bland diet the first 24 hours after arrival home, such as soup, liquids, crackers, etc.  Be sure to include lots of fluids daily.  Advance to a low fat / high fiber diet over the next few days after surgery.  Avoid fast food or heavy meals the first week as your are more likely to get nauseated.   ° °2. Take your usually prescribed home medications unless otherwise directed. ° °3. PAIN CONTROL: °a. Pain is best controlled by a usual combination of three different methods TOGETHER: °i. Ice/Heat °ii. Over the counter pain medication °iii. Prescription pain medication °b. Most patients will experience some swelling and bruising around the hernia(s) such as the bellybutton, groins, or old incisions.  Ice packs or heating pads (30-60 minutes up to 6 times a day) will help. Use ice for the first few days to help decrease swelling and bruising, then switch to heat to help relax tight/sore spots and speed recovery.  Some people prefer to use ice alone, heat alone, alternating between ice & heat.  Experiment  to what works for you.  Swelling and bruising can take several weeks to resolve.   °c. It is helpful to take an over-the-counter pain medication regularly for the first few weeks.  Choose one of the following that works best for you: °i. Naproxen (Aleve, etc)  Two 220mg tabs twice a day °ii. Ibuprofen (Advil, etc) Three 200mg tabs four times a day (every meal & bedtime) °iii. Acetaminophen (Tylenol, etc) 325-650mg four times a day (every meal & bedtime) °d. A  prescription for pain medication should be given to you upon discharge.  Take your pain medication as prescribed.  °i. If you are having problems/concerns with the prescription medicine (does not control pain, nausea, vomiting, rash, itching, etc), please call us [phone] to see if we need to switch you to a different pain medicine that will work better for you and/or control your side effect better. °ii. If you need a refill on your pain medication, please contact your pharmacy.  They will contact our office to request authorization. Prescriptions will not be filled after 5 pm or on week-ends. ° °4. Avoid getting constipated.  Between the surgery and the pain medications, it is common to experience some constipation.  Increasing fluid intake and taking a fiber supplement (such as Metamucil, Citrucel, FiberCon, MiraLax, etc) 1-2 times a day regularly will usually help prevent this problem from occurring.  A mild laxative (prune juice, Milk of Magnesia, MiraLax, etc) should be taken according to package directions if there are no bowel movements after 48 hours.   ° °5. Wash / shower every   day.  You may shower over the dressings as they are waterproof.    6. Remove your waterproof bandages 5 days after surgery.  You may leave the incisions open to air.  You may replace a dressing/Band-Aid to cover an incision for comfort if you wish.  Continue to shower over incision(s) after the dressing is off.  7. ACTIVITIES as tolerated:   a. You may resume  regular (light) daily activities beginning the next day--such as daily self-care, walking, climbing stairs--gradually increasing activities as tolerated.  If you can walk 30 minutes without difficulty, it is safe to try more intense activity such as jogging, treadmill, bicycling, low-impact aerobics, swimming, etc. b. Save the most intensive and strenuous activity for last such as sit-ups, heavy lifting, contact sports, etc  Refrain from any heavy lifting or straining until you are off narcotics for pain control.   c. DO NOT PUSH THROUGH PAIN.  Let pain be your guide: If it hurts to do something, don't do it.  Pain is your body warning you to avoid that activity for another week until the pain goes down. d. You may drive when you are no longer taking prescription pain medication, you can comfortably wear a seatbelt, and you can safely maneuver your car and apply brakes. e. Dennis Bast may have sexual intercourse when it is comfortable.   8. FOLLOW UP in our office a. Please call [facility] at [phone] to set up an appointment to see your surgeon in the office for a follow-up appointment approximately 2-3 weeks after your surgery. b. Make sure that you call for this appointment the day you arrive home to insure a convenient appointment time.  9.  If you have disability of FMLA / Family leave forms, please bring the forms to the office for processing.  (do not give to your surgeon).  WHEN TO CALL us [phone]: 1. Poor pain control 2. Reactions / problems with new medications (rash/itching, nausea, etc)  3. Fever over 101.5 F (38.5 C) 4. Inability to urinate 5. Nausea and/or vomiting 6. Worsening swelling or bruising 7. Continued bleeding from incision. 8. Increased pain, redness, or drainage from the incision   The clinic staff is available to answer your questions during regular business hours (8:30am-5pm).  Please dont hesitate to call and ask to speak to one of our nurses for clinical concerns.    If you have a medical emergency, go to the nearest emergency room or call 911.  A surgeon from [facility] is always on call at the hospitals in [facility], [place], [place], [place], [place]  [address] ?  [address] [address], [place], [place]   [address] MAIN: [phone] ? TOLL FREE: [phone] ? FAX: [phone] [email or web address]   Inguinal Hernia, Adult An inguinal hernia is when fat or the intestines push through the area where the leg meets the lower abdomen (groin) and create a rounded lump (bulge). This condition develops over time. There are three types of inguinal hernias. These types include:  Hernias that can be pushed back into the belly (are reducible).  Hernias that are not reducible (are incarcerated).  Hernias that are not reducible and lose their blood supply (are strangulated). This type of hernia requires emergency surgery.  What are the causes? This condition is caused by having a weak spot in the muscles or tissue. This weakness lets the hernia poke through. This condition can be triggered by:  Suddenly straining the  muscles of the lower abdomen.  Lifting heavy objects.  Straining to have a bowel movement. Difficult bowel movements (constipation) can lead to this.  Coughing.  What increases the risk? This condition is more likely to develop in:  Men.  Pregnant women.  People who: ? Are overweight. ? Work in jobs that require long periods of standing or heavy lifting. ? Have had an inguinal hernia before. ? Smoke or have lung disease. These factors can lead to long-lasting (chronic) coughing.  What are the signs or symptoms? Symptoms can depend on the size of the hernia. Often, a small inguinal hernia has no symptoms. Symptoms of a larger hernia include:  A lump in the groin. This is easier to see when the person is standing. It might not be visible when he or she is lying  down.  Pain or burning in the groin. This occurs especially when lifting, straining, or coughing.  A dull ache or a feeling of pressure in the groin.  A lump in the scrotum in men.  Symptoms of a strangulated inguinal hernia can include:  A bulge in the groin that is very painful and tender to the touch.  A bulge that turns red or purple.  Fever, nausea, and vomiting.  The inability to have a bowel movement or to pass gas.  How is this diagnosed? This condition is diagnosed with a medical history and physical exam. Your health care provider may feel your groin area and ask you to cough. How is this treated? Treatment for this condition varies depending on the size of your hernia and whether you have symptoms. If you do not have symptoms, your health care provider may have you watch your hernia carefully and come in for follow-up visits. If your hernia is larger or if you have symptoms, your treatment will include surgery. Follow these instructions at home: Lifestyle  Drink enough fluid to keep your urine clear or pale yellow.  Eat a diet that includes a lot of fiber. Eat plenty of fruits, vegetables, and whole grains. Talk with your health care provider if you have questions.  Avoid lifting heavy objects.  Avoid standing for long periods of time.  Do not use tobacco products, including cigarettes, chewing tobacco, or e-cigarettes. If you need help quitting, ask your health care provider.  Maintain a healthy weight. General instructions  Do not try to force the hernia back in.  Watch your hernia for any changes in color or size. Let your health care provider know if any changes occur.  Take over-the-counter and prescription medicines only as told by your health care provider.  Keep all follow-up visits as told by your health care provider. This is important. Contact a health care provider if:  You have a fever.  You have new symptoms.  Your symptoms get worse. Get  help right away if:  You have pain in the groin that suddenly gets worse.  A bulge in the groin gets bigger suddenly and does not go down.  You are a man and you have a sudden pain in the scrotum, or the size of your scrotum suddenly changes.  A bulge in the groin area becomes red or purple and is painful to the touch.  You have nausea or vomiting that does not go away.  You feel your heart beating a lot more quickly than normal.  You cannot have a bowel movement or pass gas. This information is not intended to replace advice given to you by  your health care provider. Make sure you discuss any questions you have with your health care provider. Document Released: 07/03/2008 Document Revised: 07/23/2015 Document Reviewed: 12/25/2013 Elsevier Interactive Patient Education  2018 [employer].

## 2017-08-17 NOTE — Anesthesia Procedure Notes (Signed)
Procedure Name: Intubation Date/Time: 08/17/2017 12:15 PM Performed by: Mitzie Na, CRNA Pre-anesthesia Checklist: Patient identified, Emergency Drugs available, Suction available and Patient being monitored Patient Re-evaluated:Patient Re-evaluated prior to induction Oxygen Delivery Method: Circle system utilized Preoxygenation: Pre-oxygenation with 100% oxygen Induction Type: IV induction Ventilation: Mask ventilation without difficulty Laryngoscope Size: Mac and 3 Grade View: Grade I Tube type: Oral Number of attempts: 1 Airway Equipment and Method: Stylet Placement Confirmation: ETT inserted through vocal cords under direct vision,  positive ETCO2 and breath sounds checked- equal and bilateral Secured at: 23 cm Tube secured with: Tape Dental Injury: Teeth and Oropharynx as per pre-operative assessment

## 2017-08-17 NOTE — Op Note (Signed)
08/17/2017  2:04 PM  PATIENT:  Charles Randall.  84 y.o. male  Patient Care Team: Nickola Major, MD as PCP - General (Family Medicine) Croitoru, Dani Gobble, MD as PCP - Cardiology (Cardiology) Michael Boston, MD as Consulting Physician (General Surgery) Nolon Lennert, MD as Referring Physician (Orthopedic Surgery) Penni Bombard, MD as Consulting Physician (Neurology)  PRE-OPERATIVE DIAGNOSIS:  inguinal hernias in both groins  POST-OPERATIVE DIAGNOSIS:   BILATERAL INGUINAL HERNIAS RIGHT FEMORAL HERNIA  PROCEDURE:   LAPAROSCOPIC BILATERAL INGUINAL HERNIA REPAIR LAPAROSCOPIC RIGHT FEMORAL HERNIA REPAIR INSERTION OF MESH   SURGEON:  Adin Hector, MD  ASSISTANT: None  ANESTHESIA:     Regional ilioinguinal and genitofemoral and spermatic cord nerve blocks  General  EBL:  Total I/O In: -  Out: 25 [Blood:25].  See anesthesia record  Delay start of Pharmacological VTE agent (>24hrs) due to surgical blood loss or risk of bleeding:  no  DRAINS: NONE  SPECIMEN:  NONE  DISPOSITION OF SPECIMEN:  N/A  COUNTS:  YES  PLAN OF CARE: Discharge to home after PACU  PATIENT DISPOSITION:  PACU - hemodynamically stable.  INDICATION: Pleasant elderly patient with increasingly symptomatic left inguinal hernia.  Also right inguinal hernia noted on exam.  Cleared by cardiology.  Worsening discomfort and symptoms and wishED to be aggressive to repair.  The anatomy & physiology of the abdominal wall and pelvic floor was discussed.  The pathophysiology of hernias in the inguinal and pelvic region was discussed.  Natural history risks such as progressive enlargement, pain, incarceration & strangulation was discussed.   Contributors to complications such as smoking, obesity, diabetes, prior surgery, etc were discussed.    I feel the risks of no intervention will lead to serious problems that outweigh the operative risks; therefore, I recommended surgery to reduce and repair the hernia.   I explained laparoscopic techniques with possible need for an open approach.  I noted usual use of mesh to patch and/or buttress hernia repair  Risks such as bleeding, infection, abscess, need for further treatment, heart attack, death, and other risks were discussed.  I noted a good likelihood this will help address the problem.   Goals of post-operative recovery were discussed as well.  Possibility that this will not correct all symptoms was explained.  I stressed the importance of low-impact activity, aggressive pain control, avoiding constipation, & not pushing through pain to minimize risk of post-operative chronic pain or injury. Possibility of reherniation was discussed.  We will work to minimize complications.     An educational handout further explaining the pathology & treatment options was given as well.  Questions were answered.  The patient expresses understanding & wishes to proceed with surgery.  OR FINDINGS: Moderate size indirect left inguinal hernia with epiploic appendage of sigmoid colon incarcerated within it.  Also some fatty mass within it most likely some fat necrosis.  Hernia sac and lead points excised.  No femoral or obturator direct space hernias.  On the right side smaller but definite right inguinal hernia indirect.  No leak point.  Small but definite femoral hernia as well.  No direct space Nor obturator hernias  DESCRIPTION:  The patient was identified & brought into the operating room. The patient was positioned supine with arms tucked. SCDs were active during the entire case. The patient underwent general anesthesia without any difficulty.  The abdomen was prepped and draped in a sterile fashion. The patient's bladder was emptied.  A Surgical Timeout confirmed our plan.  I made a transverse incision through the inferior umbilical fold.  I made a small transverse nick through the anterior rectus fascia contralateral to the inguinal hernia side and placed a 0-vicryl  stitch through the fascia.  I placed a Hasson trocar into the preperitoneal plane.  Entry was clean.  We induced carbon dioxide insufflation. Camera inspection revealed no injury.  I used a 98mm angled scope to bluntly free the peritoneum off the infraumbilical anterior abdominal wall.  I created enough of a preperitoneal pocket to place 90mm ports into the right & left mid-abdomen into this preperitoneal cavity.  I focused attention on the LEFT pelvis since that was the dominant hernia side.   I used blunt & focused sharp dissection to free the peritoneum off the flank and down to the pubic rim.  I freed the anteriolateral bladder wall off the anteriolateral pelvic wall, sparing midline attachments.   I located a swath of peritoneum going into a hernia fascial defect at the  internal ring consistent with  an indirect inguinal hernia..  I gradually freed the peritoneal hernia sac off safely and reduced it into the preperitoneal space.  I freed the peritoneum off the  spermatic vessels & vas deferens.  I freed peritoneum off the retroperitoneum along the psoas muscle.  Had a thickened ellipsoid mass in the hernia sac itself.  It was quite redundant so ended up opening up the hernia sac and excising a fatty ellipsoid mass as well as an epiploic appendage stuck to the apex of the hernia sac.  Closed that peritoneal defect with 2-0 Vicryl suture using intracorporeal suturing.  Spermatic cord lipoma was dissected away & removed.  I checked & assured hemostasis.     I turned attention on the opposite  RIGHT pelvis.  I did dissection in a similar, mirror-image fashion. The patient had an indirect inguinal hernia.  Small but definite femoral hernia noted as well.  Spermatic cord lipoma was dissected away & removed.    I checked & assured hemostasis.     I chose 15x15 cm sheets of ultra-lightweight polypropylene mesh (Ultrapro), one for each side.  I cut a single sigmoid-shaped slit ~6cm from a corner of each mesh.  I  placed the meshes into the preperitoneal space & laid them as overlapping diamonds such that at the inferior points, a 6x6 cm corner flap rested in the true anterolateral pelvis, covering the obturator & femoral foramina.   I allowed the bladder to return to the pubis, this helping tuck the corners of the mesh in the anteriolateral pelvis.  The medial corners overlapped each other across midline cephalad to the pubic rim.   This provided >2 inch coverage around the hernias. Because the defects well covered and not particularly large, I did not place any tacks.   I held the hernia sacs cephalad & evacuated carbon dioxide.  I closed the fascia with absorbable suture.  I closed the skin using 4-0 monocryl stitch.  Sterile dressings were applied.   The patient was extubated & arrived in the PACU in stable condition..  I had discussed postoperative care with the patient in the holding area.  Instructions are written in the chart.  I discussed operative findings, updated the patient's status, discussed probable steps to recovery, and gave postoperative recommendations to the patient's family.  Recommendations were made.  Questions were answered.  They expressed understanding & appreciation.   Adin Hector, M.D., F.A.C.S. Gastrointestinal and Minimally Invasive Surgery [facility]  [address] [place], [place] [address] [phone] Main / Paging  08/17/2017 2:04 PM

## 2017-08-17 NOTE — Anesthesia Postprocedure Evaluation (Signed)
Anesthesia Post Note  Patient: Makhari Dovidio.  Procedure(s) Performed: LAPAROSCOPIC BILATERAL INGUINAL HERNIA REPAIR (Bilateral Inguinal) INSERTION OF MESH (Bilateral ) HERNIA REPAIR FEMORAL LAPAROSCOPIC (Right )     Patient location during evaluation: PACU Anesthesia Type: General Level of consciousness: awake and alert Pain management: pain level controlled Vital Signs Assessment: post-procedure vital signs reviewed and stable Respiratory status: spontaneous breathing, nonlabored ventilation, respiratory function stable and patient connected to nasal cannula oxygen Cardiovascular status: blood pressure returned to baseline and stable Postop Assessment: no apparent nausea or vomiting Anesthetic complications: no    Last Vitals:  Vitals:   08/17/17 1415 08/17/17 1423  BP: 136/63   Pulse: (!) 53 (!) 46  Resp: 17 15  Temp:    SpO2: 100% 100%    Last Pain:  Vitals:   08/17/17 1415  TempSrc:   PainSc: Asleep                 Chiquetta Langner

## 2017-08-17 NOTE — Progress Notes (Signed)
Pt was not able to void. Prior bladder scan showed >123mL in the bladder. Spoke with Dr. Johney Maine prior to bladder scan, order to I&O cath, if more than 200ccs, leave foley in place. Have pt/family call the office on Monday to have foley cath removed.   Foley placed, 300ccs returned. Foley left in place.  Family was taught how to remove the foley on their own but were instructed to call the office on Monday to see if they were to have it removed at the office.   Discharge instructions specific to foleys was printed and presented to pt and family in addition to discharge instructions included in packet. Pt and family verbalize understanding, and demonstrated emptying foley bag.  Leg bag placed prior to discharge with an extra drainage bag and leg strap provided.  Vital signs stable. Assessment unchanged. All discharge instructions provided to pt and family. All questions answered. Pt sent home with foley and instructions on how to empty bag, switch from leg to drainage bag, and cath care were provided. Pt able to drink water without nausea. Pt's pain under control prior to discharge.   Roselind Rily

## 2017-08-17 NOTE — Transfer of Care (Signed)
Immediate Anesthesia Transfer of Care Note  Patient: Charles Randall.  Procedure(s) Performed: LAPAROSCOPIC BILATERAL INGUINAL HERNIA REPAIR (Bilateral Inguinal) INSERTION OF MESH (Bilateral ) HERNIA REPAIR FEMORAL LAPAROSCOPIC (Right )  Patient Location: PACU  Anesthesia Type:General  Level of Consciousness: drowsy and patient cooperative  Airway & Oxygen Therapy: Patient Spontanous Breathing and Patient connected to face mask oxygen  Post-op Assessment: Report given to RN, Post -op Vital signs reviewed and stable and Patient moving all extremities  Post vital signs: Reviewed and stable  Last Vitals:  Vitals Value Taken Time  BP 138/69 08/17/2017  2:08 PM  Temp    Pulse 53 08/17/2017  2:12 PM  Resp 17 08/17/2017  2:12 PM  SpO2 100 % 08/17/2017  2:12 PM  Vitals shown include unvalidated device data.  Last Pain:  Vitals:   08/17/17 1018  TempSrc:   PainSc: 0-No pain      Patients Stated Pain Goal: 4 (00/45/99 7741)  Complications: No apparent anesthesia complications

## 2017-08-17 NOTE — Anesthesia Preprocedure Evaluation (Signed)
Anesthesia Evaluation  Patient identified by MRN, date of birth, ID band Patient awake    Reviewed: Allergy & Precautions, H&P , NPO status , Patient's Chart, lab work & pertinent test results, reviewed documented beta blocker date and time   Airway Mallampati: II  TM Distance: >3 FB Neck ROM: full    Dental no notable dental hx.    Pulmonary former smoker,    Pulmonary exam normal breath sounds clear to auscultation       Cardiovascular Exercise Tolerance: Good hypertension, Pt. on medications + Valvular Problems/Murmurs AI  Rhythm:regular Rate:Normal  Echo 1/18 Study Conclusions  - Left ventricle: The cavity size was normal. Wall thickness was   normal. Systolic function was normal. The estimated ejection   fraction was in the range of 60% to 65%. Wall motion was normal;   there were no regional wall motion abnormalities. Features are   consistent with a pseudonormal left ventricular filling pattern,   with concomitant abnormal relaxation and increased filling   pressure (grade 2 diastolic dysfunction). - Aortic valve: Moderately calcified annulus. Moderately thickened,   moderately calcified leaflets. There was moderate regurgitation. - Mitral valve: There was mild regurgitation. - Pulmonary arteries: Systolic pressure was moderately increased.   PA peak pressure: 44 mm Hg (S).   Neuro/Psych Seizures -, Well Controlled,     GI/Hepatic hiatal hernia,   Endo/Other    Renal/GU      Musculoskeletal  (+) Arthritis , Osteoarthritis,    Abdominal   Peds  Hematology  (+) anemia ,   Anesthesia Other Findings   Reproductive/Obstetrics                             Anesthesia Physical Anesthesia Plan  ASA: III  Anesthesia Plan: General   Post-op Pain Management:    Induction: Intravenous  PONV Risk Score and Plan: 2 and Treatment may vary due to age or medical condition and  Ondansetron  Airway Management Planned: Oral ETT  Additional Equipment:   Intra-op Plan:   Post-operative Plan: Extubation in OR  Informed Consent: I have reviewed the patients History and Physical, chart, labs and discussed the procedure including the risks, benefits and alternatives for the proposed anesthesia with the patient or authorized representative who has indicated his/her understanding and acceptance.   Dental Advisory Given  Plan Discussed with: CRNA, Anesthesiologist and Surgeon  Anesthesia Plan Comments: (  )        Anesthesia Quick Evaluation

## 2017-08-18 ENCOUNTER — Encounter (HOSPITAL_COMMUNITY): Payer: Self-pay | Admitting: Surgery

## 2017-08-30 NOTE — Progress Notes (Signed)
[facility]  PATIENT: Charles Randall. DOB: [birth year]   REASON FOR VISIT: follow up for seizure disorder/TIA HISTORY FROM: Patient and friend [name]   HISTORY OF PRESENT ILLNESS:UPDATE 7/8/2019CM Mr. Polanco, 84 year old male returns for follow-up with history of seizure disorder /TIA.He is currently on aspirin for secondary stroke prevention without further TIA symptoms .He remains on Keppra 750 mg 2 tablets twice daily he denies any side effects to his medication.  He ambulates with a rolling walker.  No recent falls.  He returns for reevaluation  UPDATE 08/30/16: Since last visit, doing well. No more TIA or seizures. Tolerating medications.   UPDATE 05/23/16: Since last visit, was doing well until 2 weeks ago, had 10 min episode of right face drooping, slurred speech, right hand tingling (~05/02/16). Then resolved. Did not seek medical attention. Saw Dr. Hollie Salk, and she referred patient to see [place]. No other alleviating or aggravating factors.  UPDATE 11/16/15: Since last visit, doing well. No definite seizures. Tolerating LEV 1500mg  BID. Patient not driving, due to concern from his friend about poss of un-witnessed seizures. No indication of this, but rather the theoretical possibility.   UPDATE 05/12/15: Since last visit, still having 1-2 seizures per year (staring spells; mouth automatisms) --> 2014 x 1; 2015 x 2; 2016 x 2. Still with interrupted sleep. Tolerating LEV 1000mg  BID.  UPDATE 05/29/12: Doing well. No seizures. Tolerating medications.  UPDATE 11/29/11: Has not had any seizure episodes. Tolerating LEV 1000mg  BID, tolerating well without mood changes or increased dizziness. No complaints today. He has been driving when he absolutely has no other way.   UPDATE 08/29/11: He has had 2 seizures on 08/11/11. Were similar to his previous seizures. Denies bowel/bladder incontinence. Denies sleep changes, missed doses, fever or poor food/fluid intake. He was  increased to 1000mg  BID, tolerating well without dizziness or mood changes.   UPDATE 10/18/10: Had another spell on 10/13/10. Woke up normally, read paper. Then around 9am, was found by girlfried, slumped against wall in bedroom, with blood from mouth. Taken to ER by EMS. He was somewhat combative and confused for 2-3 hours. CT head and neck unremarkable. Started on LEV 500mg  BID. No further events. Family reports poor sleep patterns, snoring and daytime sleepiness. Sleeps 5 hrs per night, tossing and turning. Able to fall asleep very easily.  UPDATE 06/30/10: No further events. Last spell 03/15/10. Not driving. Feels good. Testing reviewed.  PRIOR HPI: 84 yo right-handed Caucasian male with a past medical history of HTN, hyperlipidemia, post-polio syndrome, vitamin D deficiency, osteoporosis with history of compression fracture, left inguinal hernia, and eczema who presents for evaluation for possible atypical seizures. He is accompanied today by his long-term girlfriend who helps provide details regarding these episodes. Starting about 2-3 years ago and occurring about every 6 months, the patient has had spells where he will "blank out" for about 20-30 minutes. He reports complete amnesia during the spells, but denies any other symptoms occurring during or after the episodes. Other than a possible "warning surge" in his head immediately prior to the onset of the episodes, he denies any symptoms preceding the episodes. He has a hard time describing what the "warning surge" is. His girlfriend has noticed that during the episodes he has a "blank stare," "his face gets really white," and he will ask many questions over and over such as: "Where are we?" and "What are we doing here?" She denies any other signs, including loss of postural control, loss of bowel  or bladder continence, orofacial twitching, tongue biting, etc. Immediately following the episodes, the patient is back to his normal self.  08/2008 to 09/2008: MRI brain w/ and w/out, MRA w/out, carotid dopplers, EEG, and neurocognitive battery all unrevealing. 10-hour ambulatory EEG on 12/2009 which was negative. Most recent episode: March 15, 2010 around 12:30 PM, and was different from previous four episodes. The girlfriend found him "slumped down" in a chair with his legs and arms "as stiff as a board" (arms flexed at elbow, legs flexed at knees, eyes closed). She was able to rouse him after about 3-4 minutes, and he reports being "as weak as water" afterward, as well as very tired, which led him to sleep for the rest of the day.    REVIEW OF SYSTEMS: Full 14 system review of systems performed and notable only for those listed, all others are neg:  Constitutional: neg  Cardiovascular: neg Ear/Nose/Throat: neg  Skin: neg Eyes: neg Respiratory: neg Gastroitestinal: neg  Hematology/Lymphatic: neg  Endocrine: neg Musculoskeletal:neg Allergy/Immunology: neg Neurological: neg Psychiatric: neg Sleep : neg   ALLERGIES: No Known Allergies  HOME MEDICATIONS: Outpatient Medications Prior to Visit  Medication Sig Dispense Refill  . Ascorbic Acid (VITAMIN C) 1000 MG tablet Take 1,000 mg by mouth daily.    . Cholecalciferol (VITAMIN D-3) 1000 UNITS CAPS Take 1,000 Units by mouth daily.     . ferrous sulfate 325 (65 FE) MG EC tablet Take 325 mg by mouth daily.  1  . levETIRAcetam (KEPPRA) 750 MG tablet Take 2 tablets (1,500 mg total) by mouth 2 (two) times daily. 360 tablet 4  . lisinopril-hydrochlorothiazide (PRINZIDE,ZESTORETIC) 20-25 MG tablet Take 1 tablet by mouth daily.   0  . Magnesium 250 MG TABS Take 250 mg by mouth daily.    . vitamin B-12 (CYANOCOBALAMIN) 1000 MCG tablet Take 1,000 mcg by mouth daily.    . vitamin E 400 UNIT capsule Take 400 Units by mouth daily.     Vladimir Faster Glycol-Propyl Glycol (LUBRICANT EYE DROPS) 0.4-0.3 % SOLN Place 1 drop into both eyes 3 (three) times daily as needed (for dry/irritated  eyes.).    Marland Kitchen traMADol (ULTRAM) 50 MG tablet Take 1-2 tablets (50-100 mg total) by mouth every 6 (six) hours as needed for moderate pain or severe pain. (Patient not taking: Reported on 09/04/2017) 20 tablet 0  . Omega-3 Fatty Acids (FISH OIL PO) Take 520 mg by mouth 2 (two) times daily.     No facility-administered medications prior to visit.     PAST MEDICAL HISTORY: Past Medical History:  Diagnosis Date  . Anemia    STARTED PILLS 1 MONTH AGO NEW DX  . Aortic insufficiency    mild, moderate  . Arthritis    FINGERS SWELL PT THINKS ARTHITIS, TOP OF WRIST ALSO SWELLS ONCE IN A WHILE  . Bleeds easily ([place])    BRUISE EASY ALSO  . Cough    LAST 3 TO 4 WEEKS PHLEGM YELLOW INTERMITTENT COUGH NO FEVER  . Dyslipidemia   . H/O dizziness    OCC   . H/O Doppler ultrasound 2010  . H/O echocardiogram 2013   aortic valve disorder, EF =>55%  . H/O echocardiogram 2012   AVD, EF =>55%  . Heart murmur   . History of hiatal hernia   . HTN (hypertension)   . Inguinal hernia   . Mitral regurgitation    mild  . Pneumonia FEW YEARS AGO   X 1  . Post-polio syndrome age 45  .  Seizures ([place])    last sz 2016 NO CAUSE FOUND   . Swelling of both lower extremities    DOES NOT ALWAYS DO DOWN   . Tricuspid regurgitation    mild    PAST SURGICAL HISTORY: Past Surgical History:  Procedure Laterality Date  . COLONSCOPY  5 YRS AGO  . DENTAL SURGERY  3-4 WEEKS AGO  . FEMORAL HERNIA REPAIR Right 08/17/2017   Procedure: HERNIA REPAIR FEMORAL LAPAROSCOPIC;  Surgeon: Michael Boston, MD;  Location: [facility];  Service: General;  Laterality: Right;  . INGUINAL HERNIA REPAIR Bilateral 08/17/2017   Procedure: LAPAROSCOPIC BILATERAL INGUINAL HERNIA REPAIR;  Surgeon: Michael Boston, MD;  Location: [facility];  Service: General;  Laterality: Bilateral;  . INSERTION OF MESH Bilateral 08/17/2017   Procedure: INSERTION OF MESH;  Surgeon: Michael Boston, MD;  Location: [facility];  Service: General;  Laterality: Bilateral;     FAMILY HISTORY: Family History  Problem Relation Age of Onset  . Cancer Mother   . Heart disease Father   . Cancer Maternal Grandmother   . Cancer Maternal Grandfather   . Kidney failure Sister     SOCIAL HISTORY: Social History   Socioeconomic History  . Marital status: Unknown    Spouse name: Not on file  . Number of children: 2  . Years of education: BA [employer]  . Highest education level: Not on file  Occupational History  . Occupation: Retired  Scientific laboratory technician  . Financial resource strain: Not on file  . Food insecurity:    Worry: Not on file    Inability: Not on file  . Transportation needs:    Medical: Not on file    Non-medical: Not on file  Tobacco Use  . Smoking status: Former Smoker    Packs/day: 0.50    Years: 15.00    Pack years: 7.50    Last attempt to quit: 03/01/1963    Years since quitting: 54.5  . Smokeless tobacco: Never Used  Substance and Sexual Activity  . Alcohol use: No  . Drug use: No  . Sexual activity: Not on file  Lifestyle  . Physical activity:    Days per week: Not on file    Minutes per session: Not on file  . Stress: Not on file  Relationships  . Social connections:    Talks on phone: Not on file    Gets together: Not on file    Attends religious service: Not on file    Active member of club or organization: Not on file    Attends meetings of clubs or organizations: Not on file    Relationship status: Not on file  . Intimate partner violence:    Fear of current or ex partner: Not on file    Emotionally abused: Not on file    Physically abused: Not on file    Forced sexual activity: Not on file  Other Topics Concern  . Not on file  Social History Narrative   84 year old right-handed male who lives at home alone.  He is single (has long-term girlfriend) and has two children.    He drinks one cup caffeinated coffee daily.     PHYSICAL EXAM  Vitals:   09/04/17 1458  BP: 113/61  Pulse: 63  Weight: 142 lb 12.8 oz (64.8  kg)  Height: 5\' 7"  (1.702 m)   Body mass index is 22.37 kg/m.  Generalized: Well developed, in no acute distress  Head: normocephalic and atraumatic,. Oropharynx benign  Neck: Supple, no carotid bruits  Cardiac: Regular rate rhythm, no murmur  Musculoskeletal: No deformity  Skin venous stasis changes bilaterally LE, extremely dry skin  Neurological examination   Mentation: Alert oriented to time, place, history taking. Attention span and concentration appropriate. Recent and remote memory intact.  Follows all commands speech and language fluent.   Cranial nerve II-XII: Pupils were equal round reactive to light extraocular movements were full, visual field were full on confrontational test.  Mild left ptosis.  Facial sensation and strength were normal. hearing was intact to finger rubbing bilaterally. Uvula tongue midline. head turning and shoulder shrug were normal and symmetric.Tongue protrusion into cheek strength was normal. Motor: normal bulk and tone, full strength in the BUE, BLE, except bilateral foot drop  Sensory: normal and symmetric to light touch, pinprick, and  Vibration,  Coordination: finger-nose-finger,  no dysmetria Reflexes: 1+ in the upper extremities 0 lower extremities, plantar responses were flexor bilaterally. Gait and Station: Narrow based gait uses walker slightly unsteady foot drop related to polio syndrome DIAGNOSTIC DATA (LABS, IMAGING, TESTING) - I reviewed patient records, labs, notes, testing and imaging myself where available.  Lab Results  Component Value Date   WBC 8.1 08/10/2017   HGB 12.3 (L) 08/10/2017   HCT 37.6 (L) 08/10/2017   MCV 90.2 08/10/2017   PLT 307 08/10/2017      Component Value Date/Time   NA 140 08/10/2017 1156   K 4.1 08/10/2017 1156   CL 104 08/10/2017 1156   CO2 29 08/10/2017 1156   GLUCOSE 100 (H) 08/10/2017 1156   BUN 32 (H) 08/10/2017 1156   CREATININE 1.54 (H) 08/10/2017 1156   CREATININE 2.11 (H) 02/16/2016 1144    CALCIUM 9.0 08/10/2017 1156   PROT 7.6 10/13/2010 1103   ALBUMIN 3.9 10/13/2010 1103   AST 43 (H) 10/13/2010 1103   ALT 23 10/13/2010 1103   ALKPHOS 119 (H) 10/13/2010 1103   BILITOT 0.5 10/13/2010 1103   GFRNONAA 39 (L) 08/10/2017 1156   GFRAA 45 (L) 08/10/2017 1156   ASSESSMENT AND PLAN    84 y.o. 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 Last seizure was in 12/09/14.  Also with possible TIA on week of 05/02/16, right face drooping, right hand tingling, and slurred speech. Dx: TIA vs seizure    PLAN:Continue Keppra at current dose will refill Call for seizure activity Continue aspirin daily for TIA Continue follow-up with primary care regarding blood pressure lipids and diabetes screening and management  follow-up in 1 year Dennie Bible, [facility], [facility], [facility] [address] [place], [place] [address] [phone]

## 2017-09-04 ENCOUNTER — Ambulatory Visit (INDEPENDENT_AMBULATORY_CARE_PROVIDER_SITE_OTHER): Payer: Medicare Other | Admitting: Nurse Practitioner

## 2017-09-04 ENCOUNTER — Encounter: Payer: Self-pay | Admitting: Nurse Practitioner

## 2017-09-04 VITALS — BP 113/61 | HR 63 | Ht 67.0 in | Wt 142.8 lb

## 2017-09-04 DIAGNOSIS — G40209 Localization-related (focal) (partial) symptomatic epilepsy and epileptic syndromes with complex partial seizures, not intractable, without status epilepticus: Secondary | ICD-10-CM | POA: Diagnosis not present

## 2017-09-04 DIAGNOSIS — G14 Postpolio syndrome: Secondary | ICD-10-CM | POA: Diagnosis not present

## 2017-09-04 DIAGNOSIS — G40909 Epilepsy, unspecified, not intractable, without status epilepticus: Secondary | ICD-10-CM | POA: Diagnosis not present

## 2017-09-04 MED ORDER — LEVETIRACETAM 750 MG PO TABS: 1500.0000 mg | ORAL_TABLET | Freq: Two times a day (BID) | ORAL | 4 refills | Status: DC

## 2017-09-04 NOTE — Progress Notes (Signed)
I reviewed note and agree with plan.   Penni Bombard, MD 0/08/3541, 0:14 PM Certified in Neurology, Neurophysiology and Neuroimaging  [facility] [address], [place], [place] [address] [phone]

## 2017-09-04 NOTE — Patient Instructions (Signed)
Continue Keppra at current dose will refill Continue aspirin daily Follow-up in 1 year Primary care follows labs

## 2017-10-23 ENCOUNTER — Other Ambulatory Visit: Payer: Self-pay

## 2017-10-23 ENCOUNTER — Ambulatory Visit (HOSPITAL_COMMUNITY): Payer: Medicare Other | Attending: Cardiovascular Disease

## 2017-10-23 DIAGNOSIS — I083 Combined rheumatic disorders of mitral, aortic and tricuspid valves: Secondary | ICD-10-CM | POA: Diagnosis not present

## 2017-10-23 DIAGNOSIS — I359 Nonrheumatic aortic valve disorder, unspecified: Secondary | ICD-10-CM | POA: Diagnosis not present

## 2017-11-07 ENCOUNTER — Ambulatory Visit (INDEPENDENT_AMBULATORY_CARE_PROVIDER_SITE_OTHER): Payer: Medicare Other | Admitting: Cardiovascular Disease

## 2017-11-07 ENCOUNTER — Encounter: Payer: Self-pay | Admitting: Cardiovascular Disease

## 2017-11-07 VITALS — BP 118/64 | HR 64 | Ht 66.0 in | Wt 139.2 lb

## 2017-11-07 DIAGNOSIS — I10 Essential (primary) hypertension: Secondary | ICD-10-CM | POA: Diagnosis not present

## 2017-11-07 DIAGNOSIS — I519 Heart disease, unspecified: Secondary | ICD-10-CM | POA: Diagnosis not present

## 2017-11-07 DIAGNOSIS — I352 Nonrheumatic aortic (valve) stenosis with insufficiency: Secondary | ICD-10-CM

## 2017-11-07 DIAGNOSIS — I712 Thoracic aortic aneurysm, without rupture: Secondary | ICD-10-CM

## 2017-11-07 DIAGNOSIS — I7121 Aneurysm of the ascending aorta, without rupture: Secondary | ICD-10-CM

## 2017-11-07 DIAGNOSIS — N183 Chronic kidney disease, stage 3 unspecified: Secondary | ICD-10-CM

## 2017-11-07 NOTE — Progress Notes (Signed)
Patient ID: Charles Iten., male   DOB: [birth year], 84 y.o.   MRN: [id number]    Cardiology Office Note    Date:  11/07/2017   ID:  Charles Big., DOB [birth year], MRN [id number]  PCP:  Nickola Major, MD  Cardiologist:   Sanda Klein, MD   Chief Complaint  Patient presents with  . Follow-up    Echocardiogram    History of Present Illness:  Charles Grabill. is a 84 y.o. male with a long-standing systemic hypertension, normal left ventricular systolic function and moderate aortic insufficiency related to moderate size ascending aortic aneurysm. He had syncope in the past, identified after workup to be due to seizure. He has substantial asymmetry of his lower extremities due to remote polio syndrome.   Overall he is doing well.  He underwent bilateral inguinal hernia repair in June without complications.  A follow-up echocardiogram shows normal left ventricular size and systolic function, aortic insufficiency is described as moderate, the visualized portion of the ascending aorta measures 4.6 cm.  By previous CT angiogram of the chest in 2017 the maximum aortic diameter was 4.8 cm.  A follow-up CT angiogram was recommended but the patient canceled it.  He tells me that he would not consider surgical repair even if the aneurysm was very large, as he is now 84 years old.  He has reasonably good quality of life.  His seizures are for the most part well-controlled although his wife reports a 5-minute episode of absence that occurred recently.  His seizures are generally not associated with falls or injuries.  He is not aware of them when they occur.  He is quite sedentary and uses a walker.  The patient specifically denies any chest pain at rest or with exertion, dyspnea at rest or with exertion, orthopnea, paroxysmal nocturnal dyspnea, syncope, palpitations, focal neurological deficits, intermittent claudication, lower extremity edema, unexplained weight gain, cough, hemoptysis or  wheezing.  Past Medical History:  Diagnosis Date  . Anemia    STARTED PILLS 1 MONTH AGO NEW DX  . Aortic insufficiency    mild, moderate  . Arthritis    FINGERS SWELL PT THINKS ARTHITIS, TOP OF WRIST ALSO SWELLS ONCE IN A WHILE  . Bleeds easily ([place])    BRUISE EASY ALSO  . Cough    LAST 3 TO 4 WEEKS PHLEGM YELLOW INTERMITTENT COUGH NO FEVER  . Dyslipidemia   . H/O dizziness    OCC   . H/O Doppler ultrasound 2010  . H/O echocardiogram 2013   aortic valve disorder, EF =>55%  . H/O echocardiogram 2012   AVD, EF =>55%  . Heart murmur   . History of hiatal hernia   . HTN (hypertension)   . Inguinal hernia   . Mitral regurgitation    mild  . Pneumonia FEW YEARS AGO   X 1  . Post-polio syndrome age 19  . Seizures ([place])    last sz 2016 NO CAUSE FOUND   . Swelling of both lower extremities    DOES NOT ALWAYS DO DOWN   . Tricuspid regurgitation    mild    Past Surgical History:  Procedure Laterality Date  . COLONSCOPY  5 YRS AGO  . DENTAL SURGERY  3-4 WEEKS AGO  . FEMORAL HERNIA REPAIR Right 08/17/2017   Procedure: HERNIA REPAIR FEMORAL LAPAROSCOPIC;  Surgeon: Michael Boston, MD;  Location: [facility];  Service: General;  Laterality: Right;  . INGUINAL HERNIA REPAIR Bilateral 08/17/2017  Procedure: LAPAROSCOPIC BILATERAL INGUINAL HERNIA REPAIR;  Surgeon: Michael Boston, MD;  Location: [facility];  Service: General;  Laterality: Bilateral;  . INSERTION OF MESH Bilateral 08/17/2017   Procedure: INSERTION OF MESH;  Surgeon: Michael Boston, MD;  Location: [facility];  Service: General;  Laterality: Bilateral;    Outpatient Medications Prior to Visit  Medication Sig Dispense Refill  . Ascorbic Acid (VITAMIN C) 1000 MG tablet Take 1,000 mg by mouth daily.    . Cholecalciferol (VITAMIN D-3) 1000 UNITS CAPS Take 1,000 Units by mouth daily.     . ferrous sulfate 325 (65 FE) MG EC tablet Take 325 mg by mouth daily.  1  . levETIRAcetam (KEPPRA) 750 MG tablet Take 2 tablets (1,500 mg total) by mouth  2 (two) times daily. 360 tablet 4  . lisinopril-hydrochlorothiazide (PRINZIDE,ZESTORETIC) 20-25 MG tablet Take 1 tablet by mouth daily.   0  . Magnesium 250 MG TABS Take 250 mg by mouth daily.    Vladimir Faster Glycol-Propyl Glycol (LUBRICANT EYE DROPS) 0.4-0.3 % SOLN Place 1 drop into both eyes 3 (three) times daily as needed (for dry/irritated eyes.).    Marland Kitchen traMADol (ULTRAM) 50 MG tablet Take 1-2 tablets (50-100 mg total) by mouth every 6 (six) hours as needed for moderate pain or severe pain. 20 tablet 0  . vitamin B-12 (CYANOCOBALAMIN) 1000 MCG tablet Take 1,000 mcg by mouth daily.    . vitamin E 400 UNIT capsule Take 400 Units by mouth daily.      No facility-administered medications prior to visit.      Allergies:   Patient has no known allergies.   Social History   Socioeconomic History  . Marital status: Unknown    Spouse name: Not on file  . Number of children: 2  . Years of education: BA [employer]  . Highest education level: Not on file  Occupational History  . Occupation: Retired  Scientific laboratory technician  . Financial resource strain: Not on file  . Food insecurity:    Worry: Not on file    Inability: Not on file  . Transportation needs:    Medical: Not on file    Non-medical: Not on file  Tobacco Use  . Smoking status: Former Smoker    Packs/day: 0.50    Years: 15.00    Pack years: 7.50    Last attempt to quit: 03/01/1963    Years since quitting: 54.7  . Smokeless tobacco: Never Used  Substance and Sexual Activity  . Alcohol use: No  . Drug use: No  . Sexual activity: Not on file  Lifestyle  . Physical activity:    Days per week: Not on file    Minutes per session: Not on file  . Stress: Not on file  Relationships  . Social connections:    Talks on phone: Not on file    Gets together: Not on file    Attends religious service: Not on file    Active member of club or organization: Not on file    Attends meetings of clubs or organizations: Not on file    Relationship status:  Not on file  Other Topics Concern  . Not on file  Social History Narrative   84 year old right-handed male who lives at home alone.  He is single (has long-term girlfriend) and has two children.    He drinks one cup caffeinated coffee daily.     Family History:  The patient's family history includes Cancer in his maternal grandfather,  maternal grandmother, and mother; Heart disease in his father; Kidney failure in his sister.   ROS:   Please see the history of present illness.    ROS All other systems reviewed and are negative.   PHYSICAL EXAM:   VS:  BP 118/64   Pulse 64   Ht 5\' 6"  (1.676 m)   Wt 139 lb 3.2 oz (63.1 kg)   BMI 22.47 kg/m     General: Alert, oriented x3, no distress, very lean. Head: no evidence of trauma, PERRL, EOMI, no exophtalmos or lid lag, no myxedema, no xanthelasma; normal ears, nose and oropharynx Neck: normal jugular venous pulsations and no hepatojugular reflux; brisk carotid pulses without delay and no carotid bruits Chest: clear to auscultation, no signs of consolidation by percussion or palpation, normal fremitus, symmetrical and full respiratory excursions Cardiovascular: normal position and quality of the apical impulse, regular rhythm, normal first and second heart sounds, no murmurs, rubs or gallops Abdomen: no tenderness or distention, no masses by palpation, no abnormal pulsatility or arterial bruits, normal bowel sounds, no hepatosplenomegaly Extremities: no clubbing, cyanosis; prominent bilateral varicose veins and trace ankle edema; 2+ radial, ulnar and brachial pulses bilaterally; 2+ right femoral, posterior tibial and dorsalis pedis pulses; 2+ left femoral, posterior tibial and dorsalis pedis pulses; no subclavian or femoral bruits Neurological: grossly nonfocal Psych: Normal mood and affect  Wt Readings from Last 3 Encounters:  11/07/17 139 lb 3.2 oz (63.1 kg)  09/04/17 142 lb 12.8 oz (64.8 kg)  08/17/17 143 lb (64.9 kg)    Studies/Labs  Reviewed:   ECHO: October 23, 2017 - Left ventricle: The cavity size was normal. Systolic function was normal. The estimated ejection fraction was in the range of 60% to 65%. Wall motion was normal; there were no regional wall motion abnormalities. Doppler parameters are consistent with abnormal left ventricular relaxation (grade 1 diastolic dysfunction). Doppler parameters are consistent with indeterminate ventricular filling pressure. - Aortic valve: Valve mobility was restricted. Transvalvular velocity was within the normal range. There was no stenosis.   There was moderate regurgitation. Peak velocity (S): 205 cm/s. Mean gradient (S): 8 mm Hg. Regurgitation pressure half-time: 451 ms. - Aorta: Ascending aortic diameter: 46 mm (S). - Ascending aorta: The ascending aorta was moderately dilated. - Mitral valve: Transvalvular velocity was within the normal range. There was no evidence for stenosis. There was mild regurgitation. - Left atrium: The atrium was mildly dilated. - Right ventricle: The cavity size was mildly dilated. Wall thickness was normal. Systolic function was normal. - Right atrium: The atrium was mildly dilated. - Atrial septum: No defect or patent foramen ovale was identified. - Pulmonary arteries: Systolic pressure was within the normal range. PA peak pressure: 32 mm Hg (S).  EKG:  EKG is not ordered today.   ASSESSMENT:    1. Nonrheumatic aortic insufficiency with aortic stenosis   2. Ascending aortic aneurysm ([place])   3. Essential hypertension   4. Left ventricular diastolic dysfunction   5. CKD (chronic kidney disease) stage 3, GFR 30-59 ml/min (HCC)      PLAN:  In order of problems listed above:  1. AI: Moderate, asymptomatic and without signs of left ventricular dysfunction or dilation.  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 2. Asc Ao aneurysm: Asymptomatic and incidentally detected.  He declines any prospect of future surgical repair and I do not  see a reason we should follow-up with CT.  This would just put his renal function in parallel without  any clinical benefit.  3. HTN: Blood pressure is well controlled 4. LV diastolic dysfunction: Present by echo criteria but it is hard to say how accurate these are in an almost-nonagenarian.  No clinical heart failure. 5. Stage III chronic kidney disease: After transient deterioration in renal function, seems to be back to baseline creatinine of around 1.5, GFR around 40.   Medication Adjustments/Labs and Tests Ordered: Current medicines are reviewed at length with the patient today.  Concerns regarding medicines are outlined above.  Medication changes, Labs and Tests ordered today are listed in the Patient Instructions below. Patient Instructions  Dr Sallyanne Kuster recommends that you schedule a follow-up appointment in 12 months. You will receive a reminder letter in the mail two months in advance. If you don't receive a letter, please call our office to schedule the follow-up appointment.  If you need a refill on your cardiac medications before your next appointment, please call your pharmacy.   Signed, Sanda Klein, MD  11/07/2017 6:23 PM    [address], [place], [place]  [address] Phone: [phone]; Fax: [phone]

## 2017-11-07 NOTE — Patient Instructions (Signed)
Dr Croitoru recommends that you schedule a follow-up appointment in 12 months. You will receive a reminder letter in the mail two months in advance. If you don't receive a letter, please call our office to schedule the follow-up appointment.  If you need a refill on your cardiac medications before your next appointment, please call your pharmacy. 

## 2017-11-22 DIAGNOSIS — I739 Peripheral vascular disease, unspecified: Secondary | ICD-10-CM | POA: Insufficient documentation

## 2017-11-30 ENCOUNTER — Other Ambulatory Visit: Payer: Self-pay

## 2017-11-30 DIAGNOSIS — I739 Peripheral vascular disease, unspecified: Secondary | ICD-10-CM

## 2018-01-16 ENCOUNTER — Other Ambulatory Visit: Payer: Self-pay

## 2018-01-16 ENCOUNTER — Ambulatory Visit (INDEPENDENT_AMBULATORY_CARE_PROVIDER_SITE_OTHER): Payer: Medicare Other | Admitting: Vascular Surgery

## 2018-01-16 ENCOUNTER — Ambulatory Visit (HOSPITAL_COMMUNITY)
Admission: RE | Admit: 2018-01-16 | Discharge: 2018-01-16 | Disposition: A | Discharge status: Home / Self Care | Payer: Medicare Other | Source: Ambulatory Visit | Attending: Vascular Surgery | Admitting: Vascular Surgery

## 2018-01-16 ENCOUNTER — Encounter: Payer: Self-pay | Admitting: Vascular Surgery

## 2018-01-16 VITALS — BP 146/72 | HR 64 | Resp 18 | Ht 66.0 in | Wt 139.8 lb

## 2018-01-16 DIAGNOSIS — I739 Peripheral vascular disease, unspecified: Secondary | ICD-10-CM | POA: Insufficient documentation

## 2018-01-16 NOTE — Progress Notes (Signed)
[facility] [place]  Patient name: Charles Randall. MRN: [id number] DOB: [birth year] Sex: male  REASON FOR CONSULT: Evaluation lower extremity arterial flow  HPI: Charles Engen. is a 84 y.o. male, who is here today for evaluation of lower cavity arterial flow.  He was here with his girlfriend.  He is seen for evaluation of lower extremity swelling, cyanosis and slowly healing ulceration on his ankle.  He reports that this initially had area over his right lateral malleolus and this healed and now has a superficial ulceration over his medial malleolus.  He denies any rest pain and denies any claudication type symptoms.  He is very unsteady in his gait and does very little walking.  He does sit most of the day with his feet dependent.  Does have a post polio syndrome with small left calf compared to his right.  Past Medical History:  Diagnosis Date  . Anemia    STARTED PILLS 1 MONTH AGO NEW DX  . Aortic insufficiency    mild, moderate  . Arthritis    FINGERS SWELL PT THINKS ARTHITIS, TOP OF WRIST ALSO SWELLS ONCE IN A WHILE  . Bleeds easily ([place])    BRUISE EASY ALSO  . Chronic kidney disease   . Cough    LAST 3 TO 4 WEEKS PHLEGM YELLOW INTERMITTENT COUGH NO FEVER  . Dyslipidemia   . H/O dizziness    OCC   . H/O Doppler ultrasound 2010  . H/O echocardiogram 2013   aortic valve disorder, EF =>55%  . H/O echocardiogram 2012   AVD, EF =>55%  . Heart murmur   . History of hiatal hernia   . HTN (hypertension)   . Inguinal hernia   . Mitral regurgitation    mild  . Pneumonia FEW YEARS AGO   X 1  . Post-polio syndrome age 62  . Seizures ([place])    last sz 2016 NO CAUSE FOUND   . Swelling of both lower extremities    DOES NOT ALWAYS DO DOWN   . Tricuspid regurgitation    mild    Family History  Problem Relation Age of Onset  . Cancer Mother   . Heart disease Father   . Cancer Maternal Grandmother   . Cancer Maternal  Grandfather   . Kidney failure Sister     SOCIAL HISTORY: Social History   Socioeconomic History  . Marital status: Unknown    Spouse name: Not on file  . Number of children: 2  . Years of education: BA [employer]  . Highest education level: Not on file  Occupational History  . Occupation: Retired  Scientific laboratory technician  . Financial resource strain: Not on file  . Food insecurity:    Worry: Not on file    Inability: Not on file  . Transportation needs:    Medical: Not on file    Non-medical: Not on file  Tobacco Use  . Smoking status: Former Smoker    Packs/day: 0.50    Years: 15.00    Pack years: 7.50    Last attempt to quit: 03/01/1963    Years since quitting: 54.9  . Smokeless tobacco: Never Used  Substance and Sexual Activity  . Alcohol use: No  . Drug use: No  . Sexual activity: Not on file  Lifestyle  . Physical activity:    Days per week: Not on file    Minutes per session: Not on file  . Stress: Not on  file  Relationships  . Social connections:    Talks on phone: Not on file    Gets together: Not on file    Attends religious service: Not on file    Active member of club or organization: Not on file    Attends meetings of clubs or organizations: Not on file    Relationship status: Not on file  . Intimate partner violence:    Fear of current or ex partner: Not on file    Emotionally abused: Not on file    Physically abused: Not on file    Forced sexual activity: Not on file  Other Topics Concern  . Not on file  Social History Narrative   84 year old right-handed male who lives at home alone.  He is single (has long-term girlfriend) and has two children.    He drinks one cup caffeinated coffee daily.    No Known Allergies  Current Outpatient Medications  Medication Sig Dispense Refill  . Ascorbic Acid (VITAMIN C) 1000 MG tablet Take 1,000 mg by mouth daily.    . Cholecalciferol (VITAMIN D-3) 1000 UNITS CAPS Take 1,000 Units by mouth daily.     . ferrous sulfate  325 (65 FE) MG EC tablet Take 325 mg by mouth daily.  1  . levETIRAcetam (KEPPRA) 750 MG tablet Take 2 tablets (1,500 mg total) by mouth 2 (two) times daily. 360 tablet 4  . lisinopril-hydrochlorothiazide (PRINZIDE,ZESTORETIC) 20-25 MG tablet Take 1 tablet by mouth daily.   0  . Magnesium 250 MG TABS Take 250 mg by mouth daily.    Vladimir Faster Glycol-Propyl Glycol (LUBRICANT EYE DROPS) 0.4-0.3 % SOLN Place 1 drop into both eyes 3 (three) times daily as needed (for dry/irritated eyes.).    Marland Kitchen vitamin B-12 (CYANOCOBALAMIN) 1000 MCG tablet Take 1,000 mcg by mouth daily.    . vitamin E 400 UNIT capsule Take 400 Units by mouth daily.     . traMADol (ULTRAM) 50 MG tablet Take 1-2 tablets (50-100 mg total) by mouth every 6 (six) hours as needed for moderate pain or severe pain. (Patient not taking: Reported on 01/16/2018) 20 tablet 0   No current facility-administered medications for this visit.     REVIEW OF SYSTEMS:  [X]  denotes positive finding, [ ]  denotes negative finding Cardiac  Comments:  Chest pain or chest pressure:    Shortness of breath upon exertion:    Short of breath when lying flat:    Irregular heart rhythm: x       Vascular    Pain in calf, thigh, or hip brought on by ambulation:    Pain in feet at night that wakes you up from your sleep:     Blood clot in your veins:    Leg swelling:  x       Pulmonary    Oxygen at home:    Productive cough:     Wheezing:         Neurologic    Sudden weakness in arms or legs:     Sudden numbness in arms or legs:     Sudden onset of difficulty speaking or slurred speech:    Temporary loss of vision in one eye:     Problems with dizziness:  x       Gastrointestinal    Blood in stool:     Vomited blood:         Genitourinary    Burning when urinating:     Blood  in urine:        Psychiatric    Major depression:         Hematologic    Bleeding problems:    Problems with blood clotting too easily:        Skin    Rashes or  ulcers: x       Constitutional    Fever or chills:      PHYSICAL EXAM: Vitals:   01/16/18 1454  BP: (!) 146/72  Pulse: 64  Resp: 18  SpO2: 100%  Weight: 139 lb 12.8 oz (63.4 kg)  Height: 5\' 6"  (1.676 m)    GENERAL: The patient is a well-nourished male, in no acute distress. The vital signs are documented above. CARDIOVASCULAR: Carotid arteries without bruits bilaterally.  2+ radial and 2+ femoral pulses bilaterally.  Absent popliteal and distal pulses bilaterally PULMONARY: There is good air exchange  ABDOMEN: Soft and non-tender  MUSCULOSKELETAL: There are no major deformities or cyanosis. NEUROLOGIC: No focal weakness or paresthesias are detected. SKIN: There are no ulcers or rashes noted.  Facial ulceration over the right medial malleolus with no surrounding erythema PSYCHIATRIC: The patient has a normal affect.  DATA:  Noninvasive studies reveal ankle arm index of 0.65 on the right and 0.63 on the left  MEDICAL ISSUES: Discussed the significance of these findings with the patient and his friend present.  He does appear to have superficial femoral artery stenosis or occlusion by physical exam and noninvasive studies.  He is not having any symptoms referable to this due to his inability to walk a great deal.  He does have a very fragile skin with frequent ulcerations and these have the ability to heal.  He does have lower extremity cyanosis which I suspect is related to venous pooling which also is causing his swelling.  Since he is not having any symptoms or tissue loss, I have recommended observation only.  He understands to keep a close watch on his feet and will notify should he develop any nonhealing ulcerations   Rosetta Posner, MD [facility] Tel [phone] Pager [phone]

## 2018-02-12 ENCOUNTER — Other Ambulatory Visit: Payer: Self-pay

## 2018-02-12 ENCOUNTER — Inpatient Hospital Stay (HOSPITAL_COMMUNITY)
Admission: EM | Admit: 2018-02-12 | Discharge: 2018-02-19 | DRG: 469 | Disposition: A | Discharge status: Skilled Nursing Facility | Payer: Medicare Other | Attending: Internal Medicine | Admitting: Internal Medicine

## 2018-02-12 ENCOUNTER — Encounter (HOSPITAL_COMMUNITY): Payer: Self-pay

## 2018-02-12 ENCOUNTER — Inpatient Hospital Stay (HOSPITAL_COMMUNITY): Payer: Medicare Other

## 2018-02-12 ENCOUNTER — Emergency Department (HOSPITAL_COMMUNITY): Payer: Medicare Other

## 2018-02-12 DIAGNOSIS — G40209 Localization-related (focal) (partial) symptomatic epilepsy and epileptic syndromes with complex partial seizures, not intractable, without status epilepticus: Secondary | ICD-10-CM | POA: Diagnosis present

## 2018-02-12 DIAGNOSIS — Z87891 Personal history of nicotine dependence: Secondary | ICD-10-CM | POA: Diagnosis not present

## 2018-02-12 DIAGNOSIS — Z79899 Other long term (current) drug therapy: Secondary | ICD-10-CM

## 2018-02-12 DIAGNOSIS — W010XXA Fall on same level from slipping, tripping and stumbling without subsequent striking against object, initial encounter: Secondary | ICD-10-CM | POA: Diagnosis present

## 2018-02-12 DIAGNOSIS — I5033 Acute on chronic diastolic (congestive) heart failure: Secondary | ICD-10-CM | POA: Diagnosis not present

## 2018-02-12 DIAGNOSIS — R011 Cardiac murmur, unspecified: Secondary | ICD-10-CM | POA: Diagnosis present

## 2018-02-12 DIAGNOSIS — R0789 Other chest pain: Secondary | ICD-10-CM

## 2018-02-12 DIAGNOSIS — I351 Nonrheumatic aortic (valve) insufficiency: Secondary | ICD-10-CM | POA: Diagnosis not present

## 2018-02-12 DIAGNOSIS — I130 Hypertensive heart and chronic kidney disease with heart failure and stage 1 through stage 4 chronic kidney disease, or unspecified chronic kidney disease: Secondary | ICD-10-CM | POA: Diagnosis present

## 2018-02-12 DIAGNOSIS — S72042A Displaced fracture of base of neck of left femur, initial encounter for closed fracture: Principal | ICD-10-CM | POA: Diagnosis present

## 2018-02-12 DIAGNOSIS — G40909 Epilepsy, unspecified, not intractable, without status epilepticus: Secondary | ICD-10-CM

## 2018-02-12 DIAGNOSIS — N183 Chronic kidney disease, stage 3 unspecified: Secondary | ICD-10-CM | POA: Diagnosis present

## 2018-02-12 DIAGNOSIS — E538 Deficiency of other specified B group vitamins: Secondary | ICD-10-CM | POA: Diagnosis present

## 2018-02-12 DIAGNOSIS — Z841 Family history of disorders of kidney and ureter: Secondary | ICD-10-CM

## 2018-02-12 DIAGNOSIS — Z66 Do not resuscitate: Secondary | ICD-10-CM | POA: Diagnosis present

## 2018-02-12 DIAGNOSIS — Z96649 Presence of unspecified artificial hip joint: Secondary | ICD-10-CM

## 2018-02-12 DIAGNOSIS — M62838 Other muscle spasm: Secondary | ICD-10-CM | POA: Diagnosis not present

## 2018-02-12 DIAGNOSIS — I083 Combined rheumatic disorders of mitral, aortic and tricuspid valves: Secondary | ICD-10-CM | POA: Diagnosis not present

## 2018-02-12 DIAGNOSIS — I10 Essential (primary) hypertension: Secondary | ICD-10-CM | POA: Diagnosis present

## 2018-02-12 DIAGNOSIS — R0781 Pleurodynia: Secondary | ICD-10-CM

## 2018-02-12 DIAGNOSIS — Z8249 Family history of ischemic heart disease and other diseases of the circulatory system: Secondary | ICD-10-CM | POA: Diagnosis not present

## 2018-02-12 DIAGNOSIS — E785 Hyperlipidemia, unspecified: Secondary | ICD-10-CM | POA: Diagnosis not present

## 2018-02-12 DIAGNOSIS — S72002A Fracture of unspecified part of neck of left femur, initial encounter for closed fracture: Secondary | ICD-10-CM | POA: Diagnosis present

## 2018-02-12 DIAGNOSIS — Z01818 Encounter for other preprocedural examination: Secondary | ICD-10-CM

## 2018-02-12 DIAGNOSIS — Z79891 Long term (current) use of opiate analgesic: Secondary | ICD-10-CM

## 2018-02-12 DIAGNOSIS — I5031 Acute diastolic (congestive) heart failure: Secondary | ICD-10-CM

## 2018-02-12 DIAGNOSIS — I361 Nonrheumatic tricuspid (valve) insufficiency: Secondary | ICD-10-CM | POA: Diagnosis not present

## 2018-02-12 DIAGNOSIS — R1310 Dysphagia, unspecified: Secondary | ICD-10-CM

## 2018-02-12 DIAGNOSIS — G14 Postpolio syndrome: Secondary | ICD-10-CM | POA: Diagnosis not present

## 2018-02-12 DIAGNOSIS — S72002G Fracture of unspecified part of neck of left femur, subsequent encounter for closed fracture with delayed healing: Secondary | ICD-10-CM | POA: Diagnosis not present

## 2018-02-12 DIAGNOSIS — J9621 Acute and chronic respiratory failure with hypoxia: Secondary | ICD-10-CM

## 2018-02-12 LAB — TYPE AND SCREEN
ABO/RH(D): O NEG
Antibody Screen: NEGATIVE

## 2018-02-12 LAB — BASIC METABOLIC PANEL
ANION GAP: 10 (ref 5–15)
BUN: 39 mg/dL — ABNORMAL HIGH (ref 8–23)
CALCIUM: 9.1 mg/dL (ref 8.9–10.3)
CO2: 26 mmol/L (ref 22–32)
CREATININE: 1.55 mg/dL — AB (ref 0.61–1.24)
Chloride: 104 mmol/L (ref 98–111)
GFR calc non Af Amer: 39 mL/min — ABNORMAL LOW (ref >60)
GFR, EST AFRICAN AMERICAN: 46 mL/min — AB (ref >60)
Glucose, Bld: 122 mg/dL — ABNORMAL HIGH (ref 70–99)
Potassium: 3.8 mmol/L (ref 3.5–5.1)
Sodium: 140 mmol/L (ref 135–145)

## 2018-02-12 LAB — CBC WITH DIFFERENTIAL/PLATELET
Abs Immature Granulocytes: 0.07 10*3/uL (ref 0.00–0.07)
BASOS PCT: 0 %
Basophils Absolute: 0 10*3/uL (ref 0.0–0.1)
EOS ABS: 0 10*3/uL (ref 0.0–0.5)
EOS PCT: 0 %
HEMATOCRIT: 37.4 % — AB (ref 39.0–52.0)
Hemoglobin: 12.1 g/dL — ABNORMAL LOW (ref 13.0–17.0)
IMMATURE GRANULOCYTES: 1 %
LYMPHS ABS: 0.6 10*3/uL — AB (ref 0.7–4.0)
Lymphocytes Relative: 5 %
MCH: 29.7 pg (ref 26.0–34.0)
MCHC: 32.4 g/dL (ref 30.0–36.0)
MCV: 91.7 fL (ref 80.0–100.0)
MONOS PCT: 8 %
Monocytes Absolute: 1.2 10*3/uL — ABNORMAL HIGH (ref 0.1–1.0)
NEUTROS PCT: 86 %
NRBC: 0 % (ref 0.0–0.2)
Neutro Abs: 12.4 10*3/uL — ABNORMAL HIGH (ref 1.7–7.7)
PLATELETS: 216 10*3/uL (ref 150–400)
RBC: 4.08 MIL/uL — ABNORMAL LOW (ref 4.22–5.81)
RDW: 12.9 % (ref 11.5–15.5)
WBC: 14.3 10*3/uL — ABNORMAL HIGH (ref 4.0–10.5)

## 2018-02-12 LAB — PROTIME-INR
INR: 1.12
Prothrombin Time: 14.4 seconds (ref 11.4–15.2)

## 2018-02-12 LAB — CK: CK TOTAL: 217 U/L (ref 49–397)

## 2018-02-12 LAB — ABO/RH: ABO/RH(D): O NEG

## 2018-02-12 MED ORDER — HYDROCODONE-ACETAMINOPHEN 5-325 MG PO TABS
1.0000 | ORAL_TABLET | Freq: Four times a day (QID) | ORAL | Status: DC | PRN
  Administered 2018-02-12: 1 via ORAL
  Administered 2018-02-13 (×2): 2 via ORAL
  Filled 2018-02-12: qty 2
  Filled 2018-02-12: qty 1
  Filled 2018-02-12: qty 2

## 2018-02-12 MED ORDER — LEVETIRACETAM 500 MG PO TABS
1500.0000 mg | ORAL_TABLET | Freq: Two times a day (BID) | ORAL | Status: DC
  Administered 2018-02-13 – 2018-02-19 (×14): 1500 mg via ORAL
  Filled 2018-02-12 (×14): qty 3

## 2018-02-12 MED ORDER — MORPHINE SULFATE (PF) 2 MG/ML IV SOLN: 0.5000 mg | INTRAVENOUS | Status: DC | PRN

## 2018-02-12 MED ORDER — METHOCARBAMOL 500 MG PO TABS
500.0000 mg | ORAL_TABLET | Freq: Four times a day (QID) | ORAL | Status: DC | PRN
  Administered 2018-02-12: 500 mg via ORAL
  Filled 2018-02-12: qty 1

## 2018-02-12 MED ORDER — ENOXAPARIN SODIUM 30 MG/0.3ML ~~LOC~~ SOLN
30.0000 mg | Freq: Every day | SUBCUTANEOUS | Status: DC
  Administered 2018-02-12: 30 mg via SUBCUTANEOUS
  Filled 2018-02-12: qty 0.3

## 2018-02-12 MED ORDER — VITAMIN B-12 1000 MCG PO TABS
1000.0000 ug | ORAL_TABLET | Freq: Every day | ORAL | Status: DC
  Administered 2018-02-14 – 2018-02-19 (×6): 1000 ug via ORAL
  Filled 2018-02-12 (×6): qty 1

## 2018-02-12 MED ORDER — VITAMIN E 180 MG (400 UNIT) PO CAPS
400.0000 [IU] | ORAL_CAPSULE | Freq: Every day | ORAL | Status: DC
  Administered 2018-02-14 – 2018-02-19 (×6): 400 [IU] via ORAL
  Filled 2018-02-12 (×7): qty 1

## 2018-02-12 MED ORDER — MAGNESIUM OXIDE 400 (241.3 MG) MG PO TABS
200.0000 mg | ORAL_TABLET | Freq: Every day | ORAL | Status: DC
  Administered 2018-02-14 – 2018-02-19 (×6): 200 mg via ORAL
  Filled 2018-02-12 (×6): qty 1

## 2018-02-12 MED ORDER — POLYVINYL ALCOHOL 1.4 % OP SOLN
1.0000 [drp] | Freq: Three times a day (TID) | OPHTHALMIC | Status: DC | PRN
  Filled 2018-02-12: qty 15

## 2018-02-12 MED ORDER — METHOCARBAMOL 1000 MG/10ML IJ SOLN
500.0000 mg | Freq: Four times a day (QID) | INTRAVENOUS | Status: DC | PRN
  Filled 2018-02-12: qty 5

## 2018-02-12 MED ORDER — VITAMIN D 25 MCG (1000 UNIT) PO TABS
1000.0000 [IU] | ORAL_TABLET | Freq: Every day | ORAL | Status: DC
  Administered 2018-02-14 – 2018-02-19 (×6): 1000 [IU] via ORAL
  Filled 2018-02-12 (×6): qty 1

## 2018-02-12 MED ORDER — VITAMIN C 500 MG PO TABS
1000.0000 mg | ORAL_TABLET | Freq: Every day | ORAL | Status: DC
  Administered 2018-02-14 – 2018-02-19 (×6): 1000 mg via ORAL
  Filled 2018-02-12 (×6): qty 2

## 2018-02-12 MED ORDER — POLYETHYLENE GLYCOL 3350 17 G PO PACK: 17.0000 g | PACK | Freq: Every day | ORAL | Status: DC | PRN

## 2018-02-12 MED ORDER — SODIUM CHLORIDE 0.9 % IV SOLN
INTRAVENOUS | Status: DC
  Administered 2018-02-12 – 2018-02-14 (×5): via INTRAVENOUS

## 2018-02-12 MED ORDER — HYDROMORPHONE HCL 1 MG/ML IJ SOLN
0.5000 mg | INTRAMUSCULAR | Status: AC | PRN
Start: 2018-02-12 — End: 2018-02-12
  Administered 2018-02-12 (×2): 0.5 mg via INTRAVENOUS
  Filled 2018-02-12 (×2): qty 1

## 2018-02-12 NOTE — ED Notes (Signed)
Bed: [facility] Expected date:  Expected time:  Means of arrival:  Comments: EMS-fall 

## 2018-02-12 NOTE — H&P (Signed)
TRH H&P   Patient Demographics:    Charles Randall, is a 84 y.o. male  MRN: [id number]   DOB - 1928-01-22  Admit Date - 02/12/2018  Outpatient Primary MD for the patient is Nickola Major, MD  Referring MD: Dr. Regenia Skeeter  Outpatient Specialists: Home  Patient coming from: Home  Chief Complaint  Patient presents with  . Fall      HPI:    Charles Randall  is a 84 y.o. male, with history of hypertension, moderate aortic regurgitation, seizure disorder, chronic kidney disease stage III (baseline creatinine around 1.5) who was brought to the ED after sustaining a fall last night in his kitchen.  Patient reports that his legs gave away and landed on his left hip.  He had just returned from his girlfriends house who lives across the street.  He denied any loss of consciousness or hitting his head.  At baseline he ambulates with some support or uses a walker.  He was not using the walker when he had a fall and was heading over to the kitchen to get to his walker.  He was on the floor most of the night and called his girlfriend this morning. Patient denies any headache, blurred vision, dizziness, chest pain, palpitations, shortness of breath, nausea, vomiting, abdominal pain, dysuria, diarrhea, tingling or numbness of his extremities.  Course in the ED Vitals were stable.  Blood work showed WBC of 14 K, hemoglobin of 12.1, chemistry showed BUN of 39 and creatinine 1.55, glucose of 122.  X-ray of the hip showed acute mildly angulated fracture through the neck of the left femur. Orthopedic surgery consulted and hospitalist called for admission to medical floor.   Review of systems:    In addition to the HPI above,  Left hip pain and weakness+++ No Fever-chills, No Headache, No changes with Vision or hearing, No problems swallowing food or Liquids, No Chest pain, Cough or Shortness of  Breath, No Abdominal pain, No Nausea or vomiting, Bowel movements are regular, No Blood in stool or Urine, No dysuria, No new skin rashes or bruises, No new  tingling, numbness in any extremity, No recent weight gain or loss, No polyuria, polydypsia or polyphagia, No significant Mental Stressors.    With Past History of the following :    Past Medical History:  Diagnosis Date  . Anemia    STARTED PILLS 1 MONTH AGO NEW DX  . Aortic insufficiency    mild, moderate  . Arthritis    FINGERS SWELL PT THINKS ARTHITIS, TOP OF WRIST ALSO SWELLS ONCE IN A WHILE  . Bleeds easily ([place])    BRUISE EASY ALSO  . Chronic kidney disease   . Cough    LAST 3 TO 4 WEEKS PHLEGM YELLOW INTERMITTENT COUGH NO FEVER  . Dyslipidemia   . H/O dizziness    OCC   . H/O  Doppler ultrasound 2010  . H/O echocardiogram 2013   aortic valve disorder, EF =>55%  . H/O echocardiogram 2012   AVD, EF =>55%  . Heart murmur   . History of hiatal hernia   . HTN (hypertension)   . Inguinal hernia   . Mitral regurgitation    mild  . Pneumonia FEW YEARS AGO   X 1  . Post-polio syndrome age 16  . Seizures ([place])    last sz 2016 NO CAUSE FOUND   . Swelling of both lower extremities    DOES NOT ALWAYS DO DOWN   . Tricuspid regurgitation    mild      Past Surgical History:  Procedure Laterality Date  . COLONSCOPY  5 YRS AGO  . DENTAL SURGERY  3-4 WEEKS AGO  . FEMORAL HERNIA REPAIR Right 08/17/2017   Procedure: HERNIA REPAIR FEMORAL LAPAROSCOPIC;  Surgeon: Michael Boston, MD;  Location: [facility];  Service: General;  Laterality: Right;  . INGUINAL HERNIA REPAIR Bilateral 08/17/2017   Procedure: LAPAROSCOPIC BILATERAL INGUINAL HERNIA REPAIR;  Surgeon: Michael Boston, MD;  Location: [facility];  Service: General;  Laterality: Bilateral;  . INSERTION OF MESH Bilateral 08/17/2017   Procedure: INSERTION OF MESH;  Surgeon: Michael Boston, MD;  Location: [facility];  Service: General;  Laterality: Bilateral;      Social History:       Social History   Tobacco Use  . Smoking status: Former Smoker    Packs/day: 0.50    Years: 15.00    Pack years: 7.50    Last attempt to quit: 03/01/1963    Years since quitting: 54.9  . Smokeless tobacco: Never Used  Substance Use Topics  . Alcohol use: No     Lives -alone  Mobility -ambulate with a walker     Family History :     Family History  Problem Relation Age of Onset  . Cancer Mother   . Heart disease Father   . Cancer Maternal Grandmother   . Cancer Maternal Grandfather   . Kidney failure Sister       Home Medications:   Prior to Admission medications   Medication Sig Start Date End Date Taking? Authorizing Provider  Ascorbic Acid (VITAMIN C) 1000 MG tablet Take 1,000 mg by mouth daily.   Yes [provider]  Cholecalciferol (VITAMIN D-3) 1000 UNITS CAPS Take 1,000 Units by mouth daily.    Yes [provider]  levETIRAcetam (KEPPRA) 750 MG tablet Take 2 tablets (1,500 mg total) by mouth 2 (two) times daily. 09/04/17  Yes Dennie Bible, NP  lisinopril-hydrochlorothiazide (PRINZIDE,ZESTORETIC) 20-25 MG tablet Take 1 tablet by mouth daily.  05/24/17  Yes [provider]  Magnesium 250 MG TABS Take 250 mg by mouth daily.   Yes [provider]  Polyethyl Glycol-Propyl Glycol (LUBRICANT EYE DROPS) 0.4-0.3 % SOLN Place 1 drop into both eyes 3 (three) times daily as needed (for dry/irritated eyes.).   Yes [provider]  vitamin B-12 (CYANOCOBALAMIN) 1000 MCG tablet Take 1,000 mcg by mouth daily.   Yes [provider]  vitamin E 400 UNIT capsule Take 400 Units by mouth daily.    Yes [provider]  traMADol (ULTRAM) 50 MG tablet Take 1-2 tablets (50-100 mg total) by mouth every 6 (six) hours as needed for moderate pain or severe pain. Patient not taking: Reported on 01/16/2018 08/17/17   Michael Boston, MD     Allergies:    No Known Allergies   Physical  Exam:   Vitals  Blood pressure  107/71, pulse 90, temperature 98.5 F (36.9 C), temperature source Oral, resp. rate 17, SpO2 97 %.   General: Elderly pleasant male lying in bed in no acute distress HEENT: Pupils reactive bilaterally, EOMI, no pallor, no icterus, moist mucosa, supple neck, no cervical lymphadenopathy Chest: Clear to auscultation bilaterally, no added sound CVs: Normal S1-S2, no murmurs rub or gallop GI: Soft, nondistended, nontender, bowel sounds present Musculoskeletal: Left leg shortened and externally rotated, trace edema, limited mobility of the left hip, extremities are warm.  Distal pulses monitored with Doppler by ED physician and palpable. CNS: Alert and oriented, nonfocal    Data Review:    CBC Recent Labs  Lab 02/12/18 1051  WBC 14.3*  HGB 12.1*  HCT 37.4*  PLT 216  MCV 91.7  MCH 29.7  MCHC 32.4  RDW 12.9  LYMPHSABS 0.6*  MONOABS 1.2*  EOSABS 0.0  BASOSABS 0.0   ------------------------------------------------------------------------------------------------------------------  Chemistries  Recent Labs  Lab 02/12/18 1051  NA 140  K 3.8  CL 104  CO2 26  GLUCOSE 122*  BUN 39*  CREATININE 1.55*  CALCIUM 9.1   ------------------------------------------------------------------------------------------------------------------ CrCl cannot be calculated (Unknown ideal weight.). ------------------------------------------------------------------------------------------------------------------ No results for input(s): TSH, T4TOTAL, T3FREE, THYROIDAB in the last 72 hours.  Invalid input(s): FREET3  Coagulation profile Recent Labs  Lab 02/12/18 1051  INR 1.12   ------------------------------------------------------------------------------------------------------------------- No results for input(s): DDIMER in the last 72 hours. -------------------------------------------------------------------------------------------------------------------  Cardiac Enzymes No results for  input(s): CKMB, TROPONINI, MYOGLOBIN in the last 168 hours.  Invalid input(s): CK ------------------------------------------------------------------------------------------------------------------    Component Value Date/Time   BNP 47.5 04/08/2015 1216     ---------------------------------------------------------------------------------------------------------------  Urinalysis    Component Value Date/Time   COLORURINE YELLOW 10/13/2010 1103   APPEARANCEUR CLOUDY (A) 10/13/2010 1103   LABSPEC 1.015 10/13/2010 1103   PHURINE 5.5 10/13/2010 1103   GLUCOSEU NEGATIVE 10/13/2010 1103   HGBUR TRACE (A) 10/13/2010 1103   BILIRUBINUR NEGATIVE 10/13/2010 1103   KETONESUR NEGATIVE 10/13/2010 1103   PROTEINUR 100 (A) 10/13/2010 1103   UROBILINOGEN 0.2 10/13/2010 1103   NITRITE NEGATIVE 10/13/2010 1103   LEUKOCYTESUR NEGATIVE 10/13/2010 1103    ----------------------------------------------------------------------------------------------------------------   Imaging Results:    Dg Hip Unilat With Pelvis 2-3 Views Left  Result Date: 02/12/2018 CLINICAL DATA:  Patient fell last night striking the left side and was unable to get up and now with left hip pain. EXAM: DG HIP (WITH OR WITHOUT PELVIS) 2-3V LEFT COMPARISON:  None. FINDINGS: The patient has sustained an acute fracture of the base of the neck of the left femur. The femoral head remains normally related to the acetabulum. The intertrochanteric region appears intact as does the subtrochanteric region. IMPRESSION: Acute mildly angulated fracture through the neck of the left femur. No pelvic fracture is observed. Electronically Signed   By: David  Martinique M.D.   On: 02/12/2018 11:41    My personal review of EKG: Pending  Assessment & Plan:   Principal problem  Closed displaced fracture of left femoral neck (HCC) Continue to mechanical fall.  No other injury sustained.  Hip fracture pathway initiated. Strict bedrest.  N.p.o. after  midnight.  [facility] orthopedics consulted and per ED physician will schedule for OR tomorrow. Pain control with Vicodin and morphine for severe symptoms.  Added Robaxin for muscle spasms.  Bowel regimen. Foley catheter preoperatively.  PT/OT evaluation operatively. Nutrition and social work consult postop.  Preoperative clearance Patient at baseline is ambulatory short distances  with the help of some support or a walker.  He denies any chest pain or dyspnea on exertion or any signs or symptoms of heart failure. EKG is pending but he does not have any active cardiac symptoms. According to [facility] preoperative risk score his Estimated Risk Probability for Perioperative Myocardial Infarction or Cardiac Arrest is 0.47%.  Patient does not need further preoperative cardiac evaluation.  Patient is also not on beta-blocker as outpatient and does not need perioperative beta blockade.   Active Problems: Chronic kidney disease stage III Baseline creatinine around 1.5 and is stable.  I will hold his HCTZ and lisinopril for now.  Monitor in a.m.    Benign essential hypertension Stable.  Hold HCTZ-lisinopril for now.  PRN IV hydralazine  B12 deficiency Resume supplement    Seizure disorder (HCC) Continue Keppra.    Moderate aortic regurgitation Denies any chest discomfort or dyspnea on exertion or syncope/presyncope.    DVT Prophylaxis: Subcu Lovenox  AM Labs Ordered, also please review Full Orders  Family Communication: Admission, patients condition and plan of care including tests being ordered have been discussed with the patient his girlfriend and daughter at bedside  Code Status DNR  Likely DC to possibly SNF on 12/19 postoperatively.  Condition fair  Consults called: [facility] (Dr. Ihor Gully)  Admission status: Inpatient Patient presented with acute femoral neck fracture secondary to mechanical fall.  He will need surgical repair and monitoring postop with inpatient  physical therapy. Patient needs to be monitored in the hospital for greater than 2 midnights.  Time spent in minutes : 50  Karlton Maya M.D on 02/12/2018 at 2:47 PM  Between 7am to 7pm - Pager - [phone]. After 7pm go to [email or web address] - password [facility]  [facility] - Office  [phone]

## 2018-02-12 NOTE — ED Provider Notes (Signed)
[facility] DEPT Provider Note   CSN: [id number] Arrival date & time: 02/12/18  1005     History   Chief Complaint Chief Complaint  Patient presents with  . Fall    HPI Charles Randall. is a 84 y.o. male.  HPI  84 year old male presents after a fall last night.  Patient states that he lost his balance, which is a recurrent issue for him, and fell.  His left hip is hurting.  He has chronic left leg problems from post polio syndrome, but typically not hip pain.  Did not hit his head or neck.  No other pain such as in his back.  Chronic numbness and swelling to the left lower extremity but no new numbness or weakness.  Pain is moderate at this time.  He had a hard time getting up and was on the floor for a couple hours and then eventually able to make it to a chair and then eventually with his walker get back to his bed.  However he is not able to put weight on it.  Past Medical History:  Diagnosis Date  . Anemia    STARTED PILLS 1 MONTH AGO NEW DX  . Aortic insufficiency    mild, moderate  . Arthritis    FINGERS SWELL PT THINKS ARTHITIS, TOP OF WRIST ALSO SWELLS ONCE IN A WHILE  . Bleeds easily ([place])    BRUISE EASY ALSO  . Chronic kidney disease   . Cough    LAST 3 TO 4 WEEKS PHLEGM YELLOW INTERMITTENT COUGH NO FEVER  . Dyslipidemia   . H/O dizziness    OCC   . H/O Doppler ultrasound 2010  . H/O echocardiogram 2013   aortic valve disorder, EF =>55%  . H/O echocardiogram 2012   AVD, EF =>55%  . Heart murmur   . History of hiatal hernia   . HTN (hypertension)   . Inguinal hernia   . Mitral regurgitation    mild  . Pneumonia FEW YEARS AGO   X 1  . 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 Seizures ([place])    last sz 2016 NO CAUSE FOUND   . Swelling of both lower extremities    DOES NOT ALWAYS DO DOWN   . Tricuspid regurgitation    mild    Patient Active Problem List   Diagnosis Date Noted  . Closed displaced fracture of left femoral neck ([place])  02/12/2018  . Moderate aortic regurgitation 02/12/2018  . CKD (chronic kidney disease), stage III ([place]) 02/12/2018  . Bilateral inguinal hernia (BIH) s/p lap hernia repair w mesh 08/17/2017 08/17/2017  . Femoral hernia - right - s/p lap hernia repair w mesh 08/17/2017 08/17/2017  . Partial symptomatic epilepsy with complex partial seizures, not intractable, without status epilepticus ([place]) 11/16/2015  . Seizure disorder ([place]) 05/12/2015  . Edema of lower extremity 04/09/2015  . Benign essential hypertension 04/09/2015  . Pleural effusion, right 04/09/2015  . 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    Past Surgical History:  Procedure Laterality Date  . COLONSCOPY  5 YRS AGO  . DENTAL SURGERY  3-4 WEEKS AGO  . FEMORAL HERNIA REPAIR Right 08/17/2017   Procedure: HERNIA REPAIR FEMORAL LAPAROSCOPIC;  Surgeon: Michael Boston, MD;  Location: [facility];  Service: General;  Laterality: Right;  . INGUINAL HERNIA REPAIR Bilateral 08/17/2017   Procedure: LAPAROSCOPIC BILATERAL INGUINAL HERNIA REPAIR;  Surgeon: Michael Boston, MD;  Location: [facility];  Service: General;  Laterality: Bilateral;  . INSERTION OF MESH  Bilateral 08/17/2017   Procedure: INSERTION OF MESH;  Surgeon: Michael Boston, MD;  Location: [facility];  Service: General;  Laterality: Bilateral;        Home Medications    Prior to Admission medications   Medication Sig Start Date End Date Taking? Authorizing Provider  Ascorbic Acid (VITAMIN C) 1000 MG tablet Take 1,000 mg by mouth daily.   Yes [provider]  Cholecalciferol (VITAMIN D-3) 1000 UNITS CAPS Take 1,000 Units by mouth daily.    Yes [provider]  levETIRAcetam (KEPPRA) 750 MG tablet Take 2 tablets (1,500 mg total) by mouth 2 (two) times daily. 09/04/17  Yes Dennie Bible, NP  lisinopril-hydrochlorothiazide (PRINZIDE,ZESTORETIC) 20-25 MG tablet Take 1 tablet by mouth daily.  05/24/17  Yes [provider]  Magnesium 250 MG TABS Take 250 mg by mouth daily.    Yes [provider]  Polyethyl Glycol-Propyl Glycol (LUBRICANT EYE DROPS) 0.4-0.3 % SOLN Place 1 drop into both eyes 3 (three) times daily as needed (for dry/irritated eyes.).   Yes [provider]  vitamin B-12 (CYANOCOBALAMIN) 1000 MCG tablet Take 1,000 mcg by mouth daily.   Yes [provider]  vitamin E 400 UNIT capsule Take 400 Units by mouth daily.    Yes [provider]  traMADol (ULTRAM) 50 MG tablet Take 1-2 tablets (50-100 mg total) by mouth every 6 (six) hours as needed for moderate pain or severe pain. Patient not taking: Reported on 01/16/2018 08/17/17   Michael Boston, MD    Family History Family History  Problem Relation Age of Onset  . Cancer Mother   . Heart disease Father   . Cancer Maternal Grandmother   . Cancer Maternal Grandfather   . Kidney failure Sister     Social History Social History   Tobacco Use  . Smoking status: Former Smoker    Packs/day: 0.50    Years: 15.00    Pack years: 7.50    Last attempt to quit: 03/01/1963    Years since quitting: 54.9  . Smokeless tobacco: Never Used  Substance Use Topics  . Alcohol use: No  . Drug use: No     Allergies   Patient has no known allergies.   Review of Systems Review of Systems  Respiratory: Negative for shortness of breath.   Cardiovascular: Positive for leg swelling.  Musculoskeletal: Positive for arthralgias. Negative for neck pain.  Neurological: Positive for numbness. Negative for dizziness and headaches.  All other systems reviewed and are negative.    Physical Exam Updated Vital Signs BP 123/67 (BP Location: Right Arm)   Pulse 87   Temp 98.5 F (36.9 C) (Oral)   Resp 18   SpO2 97%   Physical Exam Vitals signs and nursing note reviewed.  Constitutional:      Appearance: He is well-developed.  HENT:     Head: Normocephalic and atraumatic.     Right Ear: External ear normal.     Left Ear: External ear normal.     Nose: Nose normal.  Eyes:      General:        Right eye: No discharge.        Left eye: No discharge.  Neck:     Musculoskeletal: Normal range of motion and neck supple. No spinous process tenderness or muscular tenderness.  Cardiovascular:     Rate and Rhythm: Normal rate and regular rhythm.     Pulses:          Dorsalis  pedis pulses are detected w/ Doppler on the right side and detected w/ Doppler on the left side.     Heart sounds: Normal heart sounds.  Pulmonary:     Effort: Pulmonary effort is normal.     Breath sounds: Normal breath sounds.  Abdominal:     Palpations: Abdomen is soft.     Tenderness: There is no abdominal tenderness.  Musculoskeletal:     Left hip: He exhibits tenderness. He exhibits normal range of motion.     Left lower leg: Edema (left foot and lower leg) present.  Skin:    General: Skin is warm and dry.  Neurological:     Mental Status: He is alert.  Psychiatric:        Mood and Affect: Mood is not anxious.      ED Treatments / Results  Labs (all labs ordered are listed, but only abnormal results are displayed) Labs Reviewed  BASIC METABOLIC PANEL - Abnormal; Notable for the following components:      Result Value   Glucose, Bld 122 (*)    BUN 39 (*)    Creatinine, Ser 1.55 (*)    GFR calc non Af Amer 39 (*)    GFR calc Af Amer 46 (*)    All other components within normal limits  CBC WITH DIFFERENTIAL/PLATELET - Abnormal; Notable for the following components:   WBC 14.3 (*)    RBC 4.08 (*)    Hemoglobin 12.1 (*)    HCT 37.4 (*)    Neutro Abs 12.4 (*)    Lymphs Abs 0.6 (*)    Monocytes Absolute 1.2 (*)    All other components within normal limits  PROTIME-INR  CK  TYPE AND SCREEN  ABO/RH    EKG ED ECG REPORT   Date: 02/12/2018  Rate: 88  Rhythm: normal sinus rhythm  QRS Axis: normal  Intervals: normal  ST/T Wave abnormalities: normal  Conduction Disutrbances:none  Narrative Interpretation: Artifact limits interpretation  Old EKG Reviewed: unchanged  I  have personally reviewed the EKG tracing and agree with the computerized printout as noted.  Radiology Dg Hip Unilat With Pelvis 2-3 Views Left  Result Date: 02/12/2018 CLINICAL DATA:  Patient fell last night striking the left side and was unable to get up and now with left hip pain. EXAM: DG HIP (WITH OR WITHOUT PELVIS) 2-3V LEFT COMPARISON:  None. FINDINGS: The patient has sustained an acute fracture of the base of the neck of the left femur. The femoral head remains normally related to the acetabulum. The intertrochanteric region appears intact as does the subtrochanteric region. IMPRESSION: Acute mildly angulated fracture through the neck of the left femur. No pelvic fracture is observed. Electronically Signed   By: David  Martinique M.D.   On: 02/12/2018 11:41    Procedures Procedures (including critical care time)  Medications Ordered in ED Medications  HYDROmorphone (DILAUDID) injection 0.5 mg (0.5 mg Intravenous Given 02/12/18 1209)     Initial Impression / Assessment and Plan / ED Course  I have reviewed the triage vital signs and the nursing notes.  Pertinent labs & imaging results that were available during my care of the patient were reviewed by me and considered in my medical decision making (see chart for details).     I discussed with Dr. Alvan Dame of orthopedics.  He will evaluate patient and asked for patient to be n.p.o. after midnight.  Patient will otherwise need a hospitalist admission.  Dr. Clementeen Graham to admit.  Final Clinical Impressions(s) / ED Diagnoses   Final diagnoses:  Closed fracture of neck of left femur, initial encounter [facility])    ED Discharge Orders    None       Sherwood Gambler, MD 02/12/18 1547

## 2018-02-12 NOTE — Progress Notes (Signed)
Lovenox per Pharmacy for DVT Prophylaxis    Pharmacy has been consulted from dosing enoxaparin (lovenox) in this patient for DVT prophylaxis.  The pharmacist has reviewed pertinent labs (Hgb _12.1__; PLT__216_), patient weight (_61.1__kg) and renal function (CrCl_28__mL/min) and decided that enoxaparin 30__mg SQ Q24Hrs is appropriate for this patient.  The pharmacy department will sign off at this time.  Please reconsult pharmacy if status changes or for further issues.  Thank you  Cyndia Diver PharmD, BCPS  02/12/2018, 10:18 PM

## 2018-02-12 NOTE — ED Triage Notes (Signed)
Patient fell around 2230 last night. Alone. Normally leaves light on , lost balance.  Fell on left side. Unable to get up.  Having left hip pain.NO LOC.   No obvious injuries or deformity notes per ems. No distress.   4/10 pain  Refused pain medication  A/Ox4 Hx. Hypertension & seizures  No allergies.

## 2018-02-12 NOTE — ED Notes (Signed)
Patients daughter Lynelle Smoke) [phone].

## 2018-02-12 NOTE — ED Notes (Signed)
ED TO INPATIENT HANDOFF REPORT  Name/Age/Gender Charles Randall. 83 y.o. male  Code Status    Code Status Orders  (From admission, onward)         Start     Ordered   02/12/18 1710  Do not attempt resuscitation (DNR)  Continuous    Question Answer Comment  In the event of cardiac or respiratory ARREST Do not call a "code blue"   In the event of cardiac or respiratory ARREST Do not perform Intubation, CPR, defibrillation or ACLS   In the event of cardiac or respiratory ARREST Use medication by any route, position, wound care, and other measures to relive pain and suffering. May use oxygen, suction and manual treatment of airway obstruction as needed for comfort.      02/12/18 1709        Code Status History    Date Active Date Inactive Code Status Order ID Comments User Context   02/12/2018 1710 02/12/2018 1710 DNR [id number]  Louellen Molder, MD ED    Advance Directive Documentation     Most Recent Value  Type of Advance Directive  Healthcare Power of Attorney  Pre-existing out of facility DNR order (yellow form or pink MOST form)  -  "MOST" Form in Place?  -      Home/SNF/Other Home  Chief Complaint fall;left hip pain  Level of Care/Admitting Diagnosis ED Disposition    ED Disposition Condition [place]: [facility] [[id number]]  Level of Care: Med-Surg [16]  Diagnosis: Left displaced femoral neck fracture [facility]) [[id number]]  Admitting Physician: Louellen Molder [phone]  Attending Physician: Louellen Molder [phone]  Estimated length of stay: past midnight tomorrow  Certification:: I certify this patient will need inpatient services for at least 2 midnights  PT Class (Do Not Modify): Inpatient [101]  PT Acc Code (Do Not Modify): Private [1]       Medical History Past Medical History:  Diagnosis Date  . Anemia    STARTED PILLS 1 MONTH AGO NEW DX  . Aortic insufficiency    mild, moderate  . Arthritis    FINGERS SWELL  PT THINKS ARTHITIS, TOP OF WRIST ALSO SWELLS ONCE IN A WHILE  . Bleeds easily ([place])    BRUISE EASY ALSO  . Chronic kidney disease   . Cough    LAST 3 TO 4 WEEKS PHLEGM YELLOW INTERMITTENT COUGH NO FEVER  . Dyslipidemia   . H/O dizziness    OCC   . H/O Doppler ultrasound 2010  . H/O echocardiogram 2013   aortic valve disorder, EF =>55%  . H/O echocardiogram 2012   AVD, EF =>55%  . Heart murmur   . History of hiatal hernia   . HTN (hypertension)   . Inguinal hernia   . Mitral regurgitation    mild  . Pneumonia FEW YEARS AGO   X 1  . Post-polio syndrome age 86  . Seizures ([place])    last sz 2016 NO CAUSE FOUND   . Swelling of both lower extremities    DOES NOT ALWAYS DO DOWN   . Tricuspid regurgitation    mild    Allergies No Known Allergies  IV Location/Drains/Wounds Patient Lines/Drains/Airways Status   Active Line/Drains/Airways    Name:   Placement date:   Placement time:   Site:   Days:   Peripheral IV 02/12/18 Left Antecubital   02/12/18    1117    Antecubital   less than 1  Urethral Catheter Raynald Blend, RN Latex 16 Fr.   08/17/17    1945    Latex   179   Incision (Closed) 08/17/17 Abdomen Other (Comment)   08/17/17    1313     179   Incision - 3 Ports Abdomen Right Umbilicus Left   76/16/07    1251     179          Labs/Imaging Results for orders placed or performed during the hospital encounter of 02/12/18 (from the past 48 hour(s))  Basic metabolic panel     Status: Abnormal   Collection Time: 02/12/18 10:51 AM  Result Value Ref Range   Sodium 140 135 - 145 mmol/L   Potassium 3.8 3.5 - 5.1 mmol/L   Chloride 104 98 - 111 mmol/L   CO2 26 22 - 32 mmol/L   Glucose, Bld 122 (H) 70 - 99 mg/dL   BUN 39 (H) 8 - 23 mg/dL   Creatinine, Ser 1.55 (H) 0.61 - 1.24 mg/dL   Calcium 9.1 8.9 - 10.3 mg/dL   GFR calc non Af Amer 39 (L) >60 mL/min   GFR calc Af Amer 46 (L) >60 mL/min   Anion gap 10 5 - 15    Comment: Performed at [facility], [place]  [address], [place], [place] [address]  CBC WITH DIFFERENTIAL     Status: Abnormal   Collection Time: 02/12/18 10:51 AM  Result Value Ref Range   WBC 14.3 (H) 4.0 - 10.5 K/uL   RBC 4.08 (L) 4.22 - 5.81 MIL/uL   Hemoglobin 12.1 (L) 13.0 - 17.0 g/dL   HCT 37.4 (L) 39.0 - 52.0 %   MCV 91.7 80.0 - 100.0 fL   MCH 29.7 26.0 - 34.0 pg   MCHC 32.4 30.0 - 36.0 g/dL   RDW 12.9 11.5 - 15.5 %   Platelets 216 150 - 400 K/uL   nRBC 0.0 0.0 - 0.2 %   Neutrophils Relative % 86 %   Neutro Abs 12.4 (H) 1.7 - 7.7 K/uL   Lymphocytes Relative 5 %   Lymphs Abs 0.6 (L) 0.7 - 4.0 K/uL   Monocytes Relative 8 %   Monocytes Absolute 1.2 (H) 0.1 - 1.0 K/uL   Eosinophils Relative 0 %   Eosinophils Absolute 0.0 0.0 - 0.5 K/uL   Basophils Relative 0 %   Basophils Absolute 0.0 0.0 - 0.1 K/uL   Immature Granulocytes 1 %   Abs Immature Granulocytes 0.07 0.00 - 0.07 K/uL    Comment: Performed at [facility], [place] [address] [place], [place] [address]  Protime-INR     Status: None   Collection Time: 02/12/18 10:51 AM  Result Value Ref Range   Prothrombin Time 14.4 11.4 - 15.2 seconds   INR 1.12     Comment: Performed at [facility], [place] [address] [place], [place] [address]  Type and screen [place]     Status: None   Collection Time: 02/12/18 10:51 AM  Result Value Ref Range   ABO/RH(D) O NEG    Antibody Screen NEG    Sample Expiration      02/15/2018 Performed at [facility], [place] [address], [place], [place] [address]   ABO/Rh     Status: None   Collection Time: 02/12/18 11:02 AM  Result Value Ref Range   ABO/RH(D)      Jenetta Downer NEG Performed at [place] [address]  [place], [place] [address]   CK     Status: None   Collection Time: 02/12/18 11:15 AM  Result Value Ref Range   Total CK 217 49 - 397 U/L    Comment: Performed at [facility], [place] [address], [place], [place]  [address]   Dg Chest Port 1 View  Result Date: 02/12/2018 CLINICAL DATA:  Preop clearance EXAM: PORTABLE CHEST 1 VIEW COMPARISON:  CT 04/16/2015 FINDINGS: Tortuous atherosclerotic aorta. Normal heart size. Clear lungs. No acute osseous abnormality. IMPRESSION: 1. No active disease. 2. Mild aortic atherosclerosis and ectasia. Electronically Signed   By: Ashley Royalty M.D.   On: 02/12/2018 15:52   Dg Hip Unilat With Pelvis 2-3 Views Left  Result Date: 02/12/2018 CLINICAL DATA:  Patient fell last night striking the left side and was unable to get up and now with left hip pain. EXAM: DG HIP (WITH OR WITHOUT PELVIS) 2-3V LEFT COMPARISON:  None. FINDINGS: The patient has sustained an acute fracture of the base of the neck of the left femur. The femoral head remains normally related to the acetabulum. The intertrochanteric region appears intact as does the subtrochanteric region. IMPRESSION: Acute mildly angulated fracture through the neck of the left femur. No pelvic fracture is observed. Electronically Signed   By: David  Martinique M.D.   On: 02/12/2018 11:41    Pending Labs Unresulted Labs (From admission, onward)    Start     Ordered   02/19/18 0500  Creatinine, serum  (enoxaparin (LOVENOX)    CrCl >/= 30 ml/min)  Weekly,   R    Comments:  while on enoxaparin therapy    02/12/18 1709   02/13/18 6122  Basic metabolic panel  Tomorrow morning,   R     02/12/18 1709   02/13/18 0500  CBC  Tomorrow morning,   R     02/12/18 1709          Vitals/Pain Today's Vitals   02/12/18 1800 02/12/18 1815 02/12/18 2004 02/12/18 2007  BP: 123/62   110/62  Pulse:  86  75  Resp:    16  Temp:      TempSrc:      SpO2:  98%  95%  PainSc:   6      Isolation Precautions No active isolations  Medications Medications  HYDROcodone-acetaminophen (NORCO/VICODIN) 5-325 MG per tablet 1-2 tablet (has no administration in time range)  morphine 2 MG/ML injection 0.5 mg (has no administration in time range)  0.9 %   sodium chloride infusion (has no administration in time range)  methocarbamol (ROBAXIN) tablet 500 mg (has no administration in time range)    Or  methocarbamol (ROBAXIN) 500 mg in dextrose 5 % 50 mL IVPB (has no administration in time range)  polyethylene glycol (MIRALAX / GLYCOLAX) packet 17 g (has no administration in time range)  enoxaparin (LOVENOX) injection 40 mg (has no administration in time range)  HYDROmorphone (DILAUDID) injection 0.5 mg (0.5 mg Intravenous Given 02/12/18 1209)    Mobility non-ambulatory

## 2018-02-13 ENCOUNTER — Inpatient Hospital Stay (HOSPITAL_COMMUNITY): Payer: Medicare Other | Admitting: Certified Registered Nurse Anesthetist

## 2018-02-13 ENCOUNTER — Encounter (HOSPITAL_COMMUNITY): Payer: Self-pay | Admitting: *Deleted

## 2018-02-13 ENCOUNTER — Encounter (HOSPITAL_COMMUNITY)
Admission: EM | Disposition: A | Discharge status: Skilled Nursing Facility | Payer: Self-pay | Source: Home / Self Care | Attending: Family Medicine

## 2018-02-13 ENCOUNTER — Inpatient Hospital Stay (HOSPITAL_COMMUNITY): Payer: Medicare Other

## 2018-02-13 DIAGNOSIS — Z96642 Presence of left artificial hip joint: Secondary | ICD-10-CM | POA: Insufficient documentation

## 2018-02-13 HISTORY — PX: TOTAL HIP ARTHROPLASTY: SHX124

## 2018-02-13 LAB — BASIC METABOLIC PANEL
Anion gap: 8 (ref 5–15)
BUN: 39 mg/dL — ABNORMAL HIGH (ref 8–23)
CO2: 24 mmol/L (ref 22–32)
Calcium: 8.4 mg/dL — ABNORMAL LOW (ref 8.9–10.3)
Chloride: 106 mmol/L (ref 98–111)
Creatinine, Ser: 1.27 mg/dL — ABNORMAL HIGH (ref 0.61–1.24)
GFR calc Af Amer: 58 mL/min — ABNORMAL LOW (ref >60)
GFR calc non Af Amer: 50 mL/min — ABNORMAL LOW (ref >60)
Glucose, Bld: 96 mg/dL (ref 70–99)
Potassium: 3.5 mmol/L (ref 3.5–5.1)
Sodium: 138 mmol/L (ref 135–145)

## 2018-02-13 LAB — CBC
HEMATOCRIT: 33.4 % — AB (ref 39.0–52.0)
Hemoglobin: 10.6 g/dL — ABNORMAL LOW (ref 13.0–17.0)
MCH: 29.8 pg (ref 26.0–34.0)
MCHC: 31.7 g/dL (ref 30.0–36.0)
MCV: 93.8 fL (ref 80.0–100.0)
Platelets: 165 10*3/uL (ref 150–400)
RBC: 3.56 MIL/uL — ABNORMAL LOW (ref 4.22–5.81)
RDW: 12.9 % (ref 11.5–15.5)
WBC: 10.7 10*3/uL — ABNORMAL HIGH (ref 4.0–10.5)
nRBC: 0 % (ref 0.0–0.2)

## 2018-02-13 LAB — SURGICAL PCR SCREEN
MRSA, PCR: NEGATIVE
Staphylococcus aureus: NEGATIVE

## 2018-02-13 SURGERY — ARTHROPLASTY, HIP, TOTAL,POSTERIOR APPROACH
Anesthesia: Spinal | Site: Hip | Laterality: Left

## 2018-02-13 MED ORDER — ALBUMIN HUMAN 5 % IV SOLN
INTRAVENOUS | Status: DC | PRN
  Administered 2018-02-13: 19:00:00 via INTRAVENOUS

## 2018-02-13 MED ORDER — POVIDONE-IODINE 10 % EX SWAB
2.0000 "application " | Freq: Once | CUTANEOUS | Status: AC
  Administered 2018-02-13: 2 via TOPICAL

## 2018-02-13 MED ORDER — ASPIRIN EC 325 MG PO TBEC
325.0000 mg | DELAYED_RELEASE_TABLET | Freq: Two times a day (BID) | ORAL | Status: DC
  Administered 2018-02-14 – 2018-02-19 (×11): 325 mg via ORAL
  Filled 2018-02-13 (×11): qty 1

## 2018-02-13 MED ORDER — 0.9 % SODIUM CHLORIDE (POUR BTL) OPTIME
TOPICAL | Status: DC | PRN
  Administered 2018-02-13: 1000 mL

## 2018-02-13 MED ORDER — PROPOFOL 10 MG/ML IV BOLUS
INTRAVENOUS | Status: AC
  Filled 2018-02-13: qty 100

## 2018-02-13 MED ORDER — CHLORHEXIDINE GLUCONATE 4 % EX LIQD
60.0000 mL | Freq: Once | CUTANEOUS | Status: AC
  Administered 2018-02-13: 4 via TOPICAL
  Filled 2018-02-13: qty 60

## 2018-02-13 MED ORDER — FENTANYL CITRATE (PF) 100 MCG/2ML IJ SOLN
INTRAMUSCULAR | Status: AC
  Filled 2018-02-13: qty 2

## 2018-02-13 MED ORDER — KETAMINE HCL 10 MG/ML IJ SOLN
INTRAMUSCULAR | Status: AC
  Filled 2018-02-13: qty 1

## 2018-02-13 MED ORDER — FENTANYL CITRATE (PF) 100 MCG/2ML IJ SOLN
INTRAMUSCULAR | Status: DC | PRN
  Administered 2018-02-13: 12.5 ug via INTRAVENOUS

## 2018-02-13 MED ORDER — METHOCARBAMOL 500 MG PO TABS: 500.0000 mg | ORAL_TABLET | Freq: Four times a day (QID) | ORAL | 0 refills | Status: DC | PRN

## 2018-02-13 MED ORDER — ONDANSETRON HCL 4 MG/2ML IJ SOLN
INTRAMUSCULAR | Status: DC | PRN
  Administered 2018-02-13: 4 mg via INTRAVENOUS

## 2018-02-13 MED ORDER — PROPOFOL 500 MG/50ML IV EMUL
INTRAVENOUS | Status: DC | PRN
  Administered 2018-02-13: 25 ug/kg/min via INTRAVENOUS

## 2018-02-13 MED ORDER — EPHEDRINE SULFATE-NACL 50-0.9 MG/10ML-% IV SOSY
PREFILLED_SYRINGE | INTRAVENOUS | Status: DC | PRN
  Administered 2018-02-13: 15 mg via INTRAVENOUS
  Administered 2018-02-13: 10 mg via INTRAVENOUS

## 2018-02-13 MED ORDER — PHENOL 1.4 % MT LIQD
1.0000 | OROMUCOSAL | Status: DC | PRN
  Filled 2018-02-13: qty 177

## 2018-02-13 MED ORDER — CEFAZOLIN SODIUM-DEXTROSE 2-4 GM/100ML-% IV SOLN
2.0000 g | Freq: Four times a day (QID) | INTRAVENOUS | Status: AC
  Administered 2018-02-13 – 2018-02-14 (×2): 2 g via INTRAVENOUS
  Filled 2018-02-13 (×2): qty 100

## 2018-02-13 MED ORDER — LACTATED RINGERS IV SOLN
INTRAVENOUS | Status: DC | PRN
  Administered 2018-02-13: 19:00:00 via INTRAVENOUS

## 2018-02-13 MED ORDER — CEFAZOLIN SODIUM-DEXTROSE 2-4 GM/100ML-% IV SOLN
2.0000 g | INTRAVENOUS | Status: AC
  Administered 2018-02-13: 2 g via INTRAVENOUS
  Filled 2018-02-13: qty 100

## 2018-02-13 MED ORDER — ONDANSETRON HCL 4 MG PO TABS: 4.0000 mg | ORAL_TABLET | Freq: Four times a day (QID) | ORAL | Status: DC | PRN

## 2018-02-13 MED ORDER — PROPOFOL 10 MG/ML IV BOLUS
INTRAVENOUS | Status: AC
  Filled 2018-02-13: qty 80

## 2018-02-13 MED ORDER — ASPIRIN 81 MG PO CHEW: 81.0000 mg | CHEWABLE_TABLET | Freq: Two times a day (BID) | ORAL | 0 refills | Status: DC

## 2018-02-13 MED ORDER — MEPERIDINE HCL 50 MG/ML IJ SOLN: 6.2500 mg | INTRAMUSCULAR | Status: DC | PRN

## 2018-02-13 MED ORDER — PHENYLEPHRINE HCL-NACL 10-0.9 MG/250ML-% IV SOLN
INTRAVENOUS | Status: AC
  Filled 2018-02-13: qty 250

## 2018-02-13 MED ORDER — FERROUS SULFATE 325 (65 FE) MG PO TABS
325.0000 mg | ORAL_TABLET | Freq: Three times a day (TID) | ORAL | Status: DC
  Filled 2018-02-13 (×2): qty 1

## 2018-02-13 MED ORDER — KETAMINE HCL 10 MG/ML IJ SOLN
INTRAMUSCULAR | Status: DC | PRN
  Administered 2018-02-13: 30 mg via INTRAVENOUS

## 2018-02-13 MED ORDER — METOCLOPRAMIDE HCL 5 MG/ML IJ SOLN: 5.0000 mg | Freq: Three times a day (TID) | INTRAMUSCULAR | Status: DC | PRN

## 2018-02-13 MED ORDER — METOCLOPRAMIDE HCL 5 MG/ML IJ SOLN: 10.0000 mg | Freq: Once | INTRAMUSCULAR | Status: DC | PRN

## 2018-02-13 MED ORDER — DOCUSATE SODIUM 100 MG PO CAPS
100.0000 mg | ORAL_CAPSULE | Freq: Two times a day (BID) | ORAL | Status: DC
  Administered 2018-02-13 – 2018-02-19 (×12): 100 mg via ORAL
  Filled 2018-02-13 (×12): qty 1

## 2018-02-13 MED ORDER — HYDROCODONE-ACETAMINOPHEN 5-325 MG PO TABS
1.0000 | ORAL_TABLET | ORAL | Status: DC | PRN
  Administered 2018-02-13 – 2018-02-18 (×7): 1 via ORAL
  Administered 2018-02-18: 2 via ORAL
  Filled 2018-02-13 (×2): qty 1
  Filled 2018-02-13: qty 2
  Filled 2018-02-13: qty 1
  Filled 2018-02-13: qty 2
  Filled 2018-02-13 (×4): qty 1

## 2018-02-13 MED ORDER — SODIUM CHLORIDE 0.9 % IV SOLN: INTRAVENOUS | Status: DC

## 2018-02-13 MED ORDER — SODIUM CHLORIDE 0.9 % IV SOLN
INTRAVENOUS | Status: DC | PRN
  Administered 2018-02-13: 50 ug/min via INTRAVENOUS

## 2018-02-13 MED ORDER — PROPOFOL 10 MG/ML IV BOLUS
INTRAVENOUS | Status: AC
  Filled 2018-02-13: qty 20

## 2018-02-13 MED ORDER — MENTHOL 3 MG MT LOZG
1.0000 | LOZENGE | OROMUCOSAL | Status: DC | PRN
  Filled 2018-02-13: qty 9

## 2018-02-13 MED ORDER — METOCLOPRAMIDE HCL 5 MG PO TABS: 5.0000 mg | ORAL_TABLET | Freq: Three times a day (TID) | ORAL | Status: DC | PRN

## 2018-02-13 MED ORDER — ONDANSETRON HCL 4 MG/2ML IJ SOLN: 4.0000 mg | Freq: Four times a day (QID) | INTRAMUSCULAR | Status: DC | PRN

## 2018-02-13 MED ORDER — PROPOFOL 10 MG/ML IV BOLUS
INTRAVENOUS | Status: DC | PRN
  Administered 2018-02-13: 20 mg via INTRAVENOUS

## 2018-02-13 MED ORDER — DEXAMETHASONE SODIUM PHOSPHATE 10 MG/ML IJ SOLN
INTRAMUSCULAR | Status: DC | PRN
  Administered 2018-02-13: 10 mg via INTRAVENOUS

## 2018-02-13 MED ORDER — ENOXAPARIN SODIUM 40 MG/0.4ML ~~LOC~~ SOLN: 40.0000 mg | Freq: Every day | SUBCUTANEOUS | Status: DC

## 2018-02-13 MED ORDER — HYDROCODONE-ACETAMINOPHEN 5-325 MG PO TABS: 1.0000 | ORAL_TABLET | ORAL | 0 refills | Status: DC | PRN

## 2018-02-13 MED ORDER — TRANEXAMIC ACID-NACL 1000-0.7 MG/100ML-% IV SOLN
1000.0000 mg | INTRAVENOUS | Status: AC
  Administered 2018-02-13: 1000 mg via INTRAVENOUS
  Filled 2018-02-13: qty 100

## 2018-02-13 MED ORDER — FENTANYL CITRATE (PF) 100 MCG/2ML IJ SOLN: 25.0000 ug | INTRAMUSCULAR | Status: DC | PRN

## 2018-02-13 SURGICAL SUPPLY — 57 items
ADH SKN CLS APL DERMABOND .7 (GAUZE/BANDAGES/DRESSINGS) ×1
BAG DECANTER FOR FLEXI CONT (MISCELLANEOUS) ×3 IMPLANT
BAG SPEC THK2 15X12 ZIP CLS (MISCELLANEOUS) ×1
BAG ZIPLOCK 12X15 (MISCELLANEOUS) ×3 IMPLANT
BLADE SAW SGTL 11.0X1.19X90.0M (BLADE) IMPLANT
BLADE SAW SGTL 18X1.27X75 (BLADE) ×2 IMPLANT
BLADE SAW SGTL 18X1.27X75MM (BLADE) ×1
BLADE SURG SZ10 CARB STEEL (BLADE) ×6 IMPLANT
COVER SURGICAL LIGHT HANDLE (MISCELLANEOUS) ×3 IMPLANT
COVER WAND RF STERILE (DRAPES) IMPLANT
DERMABOND ADVANCED (GAUZE/BANDAGES/DRESSINGS) ×2
DERMABOND ADVANCED .7 DNX12 (GAUZE/BANDAGES/DRESSINGS) ×1 IMPLANT
DRAPE ORTHO SPLIT 77X108 STRL (DRAPES) ×6
DRAPE POUCH INSTRU U-SHP 10X18 (DRAPES) ×3 IMPLANT
DRAPE SURG 17X11 SM STRL (DRAPES) ×3 IMPLANT
DRAPE SURG ORHT 6 SPLT 77X108 (DRAPES) ×2 IMPLANT
DRAPE U-SHAPE 47X51 STRL (DRAPES) ×3 IMPLANT
DRESSING AQUACEL AG SP 3.5X10 (GAUZE/BANDAGES/DRESSINGS) ×1 IMPLANT
DRSG AQUACEL AG ADV 3.5X10 (GAUZE/BANDAGES/DRESSINGS) ×2 IMPLANT
DRSG AQUACEL AG SP 3.5X10 (GAUZE/BANDAGES/DRESSINGS) ×3
DURAPREP 26ML APPLICATOR (WOUND CARE) ×3 IMPLANT
ELECT BLADE TIP CTD 4 INCH (ELECTRODE) ×3 IMPLANT
ELECT REM PT RETURN 15FT ADLT (MISCELLANEOUS) ×3 IMPLANT
FACESHIELD WRAPAROUND (MASK) ×12 IMPLANT
FACESHIELD WRAPAROUND OR TEAM (MASK) ×4 IMPLANT
GLOVE BIOGEL M 7.0 STRL (GLOVE) IMPLANT
GLOVE BIOGEL PI IND STRL 7.5 (GLOVE) ×1 IMPLANT
GLOVE BIOGEL PI IND STRL 8.5 (GLOVE) ×1 IMPLANT
GLOVE BIOGEL PI INDICATOR 7.5 (GLOVE) ×2
GLOVE BIOGEL PI INDICATOR 8.5 (GLOVE) ×2
GLOVE ECLIPSE 8.0 STRL XLNG CF (GLOVE) ×3 IMPLANT
GLOVE ORTHO TXT STRL SZ7.5 (GLOVE) ×6 IMPLANT
GOWN STRL REUS W/TWL LRG LVL3 (GOWN DISPOSABLE) ×3 IMPLANT
GOWN STRL REUS W/TWL XL LVL3 (GOWN DISPOSABLE) ×6 IMPLANT
HEAD FEM UNIPOLAR 56 OD STRL (Hips) ×2 IMPLANT
MANIFOLD NEPTUNE II (INSTRUMENTS) ×3 IMPLANT
MARKER SKIN DUAL TIP RULER LAB (MISCELLANEOUS) ×3 IMPLANT
NDL SAFETY ECLIPSE 18X1.5 (NEEDLE) ×1 IMPLANT
NEEDLE HYPO 18GX1.5 SHARP (NEEDLE) ×3
NS IRRIG 1000ML POUR BTL (IV SOLUTION) ×3 IMPLANT
PADDING CAST COTTON 6X4 STRL (CAST SUPPLIES) ×3 IMPLANT
PROTECTOR NERVE ULNAR (MISCELLANEOUS) ×3 IMPLANT
SPACER FEM TAPERED +0 12/14 (Hips) ×2 IMPLANT
STEM FEMORAL SZ8 STD ACTIS (Stem) ×2 IMPLANT
SUCTION FRAZIER HANDLE 10FR (MISCELLANEOUS) ×2
SUCTION TUBE FRAZIER 10FR DISP (MISCELLANEOUS) ×1 IMPLANT
SUT MNCRL AB 4-0 PS2 18 (SUTURE) ×3 IMPLANT
SUT STRATAFIX 0 PDS 27 VIOLET (SUTURE) ×3
SUT VIC AB 1 CT1 36 (SUTURE) ×9 IMPLANT
SUT VIC AB 2-0 CT1 27 (SUTURE) ×6
SUT VIC AB 2-0 CT1 TAPERPNT 27 (SUTURE) ×2 IMPLANT
SUTURE STRATFX 0 PDS 27 VIOLET (SUTURE) ×1 IMPLANT
SYR 50ML LL SCALE MARK (SYRINGE) ×3 IMPLANT
TOWEL OR 17X26 10 PK STRL BLUE (TOWEL DISPOSABLE) ×6 IMPLANT
TRAY FOLEY MTR SLVR 16FR STAT (SET/KITS/TRAYS/PACK) ×3 IMPLANT
WATER STERILE IRR 1000ML POUR (IV SOLUTION) ×3 IMPLANT
YANKAUER SUCT BULB TIP 10FT TU (MISCELLANEOUS) ×3 IMPLANT

## 2018-02-13 NOTE — Progress Notes (Signed)
Initial Nutrition Assessment  DOCUMENTATION CODES:   Not applicable  INTERVENTION:  - Diet advancement as medically feasible. - Will monitor for nutrition-related needs with diet advancement.    NUTRITION DIAGNOSIS:   Inadequate oral intake related to inability to eat as evidenced by NPO status.  GOAL:   Patient will meet greater than or equal to 90% of their needs  MONITOR:   Diet advancement, PO intake, Weight trends  REASON FOR ASSESSMENT:   Consult Assessment of nutrition requirement/status  ASSESSMENT:   84 year-old male with history of HTN, moderate aortic regurgitation, seizure disorder, CKD stage III. He was brought to the ED after sustaining a fall in his kitchen. Patient reports that his legs gave away and landed on his left hip. He denied any loss of consciousness or hitting his head. At baseline he ambulates with some support or uses a walker.  BMI indicates normal weight status. Patient has was on Regular diet 12/16 at 5:15 PM-midnight. No intakes during that time. Patient has been NPO all of today. Patient reports good appetite PTA with no recent changes. He either prepares food at home or goes out to eat. Patient denies any chewing or swallowing difficulties.   Per chart review, current weight is 135 lb and weight on 01/16/18 was 139 lb. This indicates 4 lb weight loss (3% body weight) in the past 1 month. This is not significant for time frame. Weight previous to 11/19 was stable since June.    Medications reviewed; 1000 units cholecalciferol/day, 200 mg Mag-ox/day, 1000 mcg cyanocobalamin/day, 1000 mg ascorbic acid/day, 400 units vitamin E/day.  Labs reviewed; BUN: 39 mg/dL, creatinine: 1.27 mg/dL, Ca: 8.4 mg/dL, GFR: 50 mL/min. IVF; NS @ 75 mL/hr      NUTRITION - FOCUSED PHYSICAL EXAM:  Completed; no muscle and no fat wasting.   Diet Order:   Diet Order            Diet NPO time specified Except for: Sips with Meds  Diet effective midnight       Diet NPO time specified  Diet effective midnight              EDUCATION NEEDS:   No education needs have been identified at this time  Skin:  Skin Assessment: Reviewed RN Assessment  Last BM:  PTA/unknown  Height:   Ht Readings from Last 1 Encounters:  02/12/18 5\' 7"  (1.702 m)    Weight:   Wt Readings from Last 1 Encounters:  02/12/18 61.1 kg    Ideal Body Weight:  67.27 kg  BMI:  Body mass index is 21.1 kg/m.  Estimated Nutritional Needs:   Kcal:  1650-1835 kcal  Protein:  65-80 grams  Fluid:  >/= 1.8 L/day     Jarome Matin, MS, RD, LDN, [facility] Inpatient Clinical Dietitian Pager # [phone] After hours/weekend pager # [phone]

## 2018-02-13 NOTE — Interval H&P Note (Signed)
History and Physical Interval Note:  02/13/2018 5:16 PM  Charles Randall.  has presented today for surgery, with the diagnosis of displaced left femoral neck fracure  The various methods of treatment have been discussed with the patient and family. After consideration of risks, benefits and other options for treatment, the patient has consented to  Procedure(s): HEMI VERSUS TOTAL HIP ARTHROPLASTY (Left) as a surgical intervention .  The patient's history has been reviewed, patient examined, no change in status, stable for surgery.  I have reviewed the patient's chart and labs.  Questions were answered to the patient's satisfaction.     Mauri Pole

## 2018-02-13 NOTE — Consult Note (Addendum)
Reason for Consult: Left hip fracture Referring Physician: Algis Liming, MD  [name] is an 84 y.o. male.  HPI: Charles Randall  is a 84 y.o. male, with history of hypertension, moderate aortic regurgitation, seizure disorder, chronic kidney disease stage III (baseline creatinine around 1.5) who was brought to the ED after sustaining a fall last night in his kitchen.  Patient reports that his legs gave away and landed on his left hip.  He had just returned from his girlfriends house who lives across the street.  He denied any loss of consciousness or hitting his head.  At baseline he ambulates with some support or uses a walker.  He was not using the walker when he had a fall and was heading over to the kitchen to get to his walker.  He was on the floor most of the night and called his girlfriend this morning. Patient denies any headache, blurred vision, dizziness, chest pain, palpitations, shortness of breath, nausea, vomiting, abdominal pain, dysuria, diarrhea, tingling or numbness of his extremities.  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  He uses a walker now over the past few years but had been "very active" otherwise prior  No other injuries to report just left hip pain   Past Medical History:  Diagnosis Date  . Anemia    STARTED PILLS 1 MONTH AGO NEW DX  . Aortic insufficiency    mild, moderate  . Arthritis    FINGERS SWELL PT THINKS ARTHITIS, TOP OF WRIST ALSO SWELLS ONCE IN A WHILE  . Bleeds easily ([place])    BRUISE EASY ALSO  . Chronic kidney disease   . Cough    LAST 3 TO 4 WEEKS PHLEGM YELLOW INTERMITTENT COUGH NO FEVER  . Dyslipidemia   . H/O dizziness    OCC   . H/O Doppler ultrasound 2010  . H/O echocardiogram 2013   aortic valve disorder, EF =>55%  . H/O echocardiogram 2012   AVD, EF =>55%  . Heart murmur   . History of hiatal hernia   . HTN (hypertension)   . Inguinal hernia   . Mitral regurgitation    mild  . Pneumonia FEW YEARS  AG[place]   X 1  . Post-polio syndrome age 48  . Seizures ([place])    last sz 2016 NO CAUSE FOUND   . Swelling of both lower extremities    DOES NOT ALWAYS DO DOWN   . Tricuspid regurgitation    mild    Past Surgical History:  Procedure Laterality Date  . COLONSCOPY  5 YRS AGO  . DENTAL SURGERY  3-4 WEEKS AGO  . FEMORAL HERNIA REPAIR Right 08/17/2017   Procedure: HERNIA REPAIR FEMORAL LAPAROSCOPIC;  Surgeon: Michael Boston, MD;  Location: [facility];  Service: General;  Laterality: Right;  . INGUINAL HERNIA REPAIR Bilateral 08/17/2017   Procedure: LAPAROSCOPIC BILATERAL INGUINAL HERNIA REPAIR;  Surgeon: Michael Boston, MD;  Location: [facility];  Service: General;  Laterality: Bilateral;  . INSERTION OF MESH Bilateral 08/17/2017   Procedure: INSERTION OF MESH;  Surgeon: Michael Boston, MD;  Location: [facility];  Service: General;  Laterality: Bilateral;    Family History  Problem Relation Age of Onset  . Cancer Mother   . Heart disease Father   . Cancer Maternal Grandmother   . Cancer Maternal Grandfather   . Kidney failure Sister     Social History:  reports that he quit smoking about 54 years ago. He has a  7.50 pack-year smoking history. He has never used smokeless tobacco. He reports that he does not drink alcohol or use drugs.  Allergies: No Known Allergies  Medications:  I have reviewed the patient's current medications. Scheduled: . cholecalciferol  1,000 Units Oral Daily  . enoxaparin (LOVENOX) injection  30 mg Subcutaneous QHS  . levETIRAcetam  1,500 mg Oral BID  . magnesium oxide  200 mg Oral Daily  . vitamin B-12  1,000 mcg Oral Daily  . vitamin C  1,000 mg Oral Daily  . vitamin E  400 Units Oral Daily    Results for orders placed or performed during the hospital encounter of 02/12/18 (from the past 24 hour(s))  Basic metabolic panel     Status: Abnormal   Collection Time: 02/12/18 10:51 AM  Result Value Ref Range   Sodium 140 135 - 145 mmol/L   Potassium 3.8 3.5 - 5.1 mmol/L    Chloride 104 98 - 111 mmol/L   CO2 26 22 - 32 mmol/L   Glucose, Bld 122 (H) 70 - 99 mg/dL   BUN 39 (H) 8 - 23 mg/dL   Creatinine, Ser 1.55 (H) 0.61 - 1.24 mg/dL   Calcium 9.1 8.9 - 10.3 mg/dL   GFR calc non Af Amer 39 (L) >60 mL/min   GFR calc Af Amer 46 (L) >60 mL/min   Anion gap 10 5 - 15  CBC WITH DIFFERENTIAL     Status: Abnormal   Collection Time: 02/12/18 10:51 AM  Result Value Ref Range   WBC 14.3 (H) 4.0 - 10.5 K/uL   RBC 4.08 (L) 4.22 - 5.81 MIL/uL   Hemoglobin 12.1 (L) 13.0 - 17.0 g/dL   HCT 37.4 (L) 39.0 - 52.0 %   MCV 91.7 80.0 - 100.0 fL   MCH 29.7 26.0 - 34.0 pg   MCHC 32.4 30.0 - 36.0 g/dL   RDW 12.9 11.5 - 15.5 %   Platelets 216 150 - 400 K/uL   nRBC 0.0 0.0 - 0.2 %   Neutrophils Relative % 86 %   Neutro Abs 12.4 (H) 1.7 - 7.7 K/uL   Lymphocytes Relative 5 %   Lymphs Abs 0.6 (L) 0.7 - 4.0 K/uL   Monocytes Relative 8 %   Monocytes Absolute 1.2 (H) 0.1 - 1.0 K/uL   Eosinophils Relative 0 %   Eosinophils Absolute 0.0 0.0 - 0.5 K/uL   Basophils Relative 0 %   Basophils Absolute 0.0 0.0 - 0.1 K/uL   Immature Granulocytes 1 %   Abs Immature Granulocytes 0.07 0.00 - 0.07 K/uL  Protime-INR     Status: None   Collection Time: 02/12/18 10:51 AM  Result Value Ref Range   Prothrombin Time 14.4 11.4 - 15.2 seconds   INR 1.12   Type and screen [place]     Status: None   Collection Time: 02/12/18 10:51 AM  Result Value Ref Range   ABO/RH(D) O NEG    Antibody Screen NEG    Sample Expiration      02/15/2018 Performed at [facility], [place] [address], [place], [place] [address]   ABO/Rh     Status: None   Collection Time: 02/12/18 11:02 AM  Result Value Ref Range   ABO/RH(D)      Jenetta Downer NEG Performed at [place] [address], [place], [place] [address]   CK     Status: None   Collection Time: 02/12/18 11:15 AM  Result Value Ref Range  Total CK 217 49 - 397 U/L  Surgical pcr screen     Status: None    Collection Time: 02/13/18  4:50 AM  Result Value Ref Range   MRSA, PCR NEGATIVE NEGATIVE   Staphylococcus aureus NEGATIVE NEGATIVE  Basic metabolic panel     Status: Abnormal   Collection Time: 02/13/18  6:05 AM  Result Value Ref Range   Sodium 138 135 - 145 mmol/L   Potassium 3.5 3.5 - 5.1 mmol/L   Chloride 106 98 - 111 mmol/L   CO2 24 22 - 32 mmol/L   Glucose, Bld 96 70 - 99 mg/dL   BUN 39 (H) 8 - 23 mg/dL   Creatinine, Ser 1.27 (H) 0.61 - 1.24 mg/dL   Calcium 8.4 (L) 8.9 - 10.3 mg/dL   GFR calc non Af Amer 50 (L) >60 mL/min   GFR calc Af Amer 58 (L) >60 mL/min   Anion gap 8 5 - 15  CBC     Status: Abnormal   Collection Time: 02/13/18  6:05 AM  Result Value Ref Range   WBC 10.7 (H) 4.0 - 10.5 K/uL   RBC 3.56 (L) 4.22 - 5.81 MIL/uL   Hemoglobin 10.6 (L) 13.0 - 17.0 g/dL   HCT 33.4 (L) 39.0 - 52.0 %   MCV 93.8 80.0 - 100.0 fL   MCH 29.8 26.0 - 34.0 pg   MCHC 31.7 30.0 - 36.0 g/dL   RDW 12.9 11.5 - 15.5 %   Platelets 165 150 - 400 K/uL   nRBC 0.0 0.0 - 0.2 %    X-ray: CLINICAL DATA:  Patient fell last night striking the left side and was unable to get up and now with left hip pain.  EXAM: DG HIP (WITH OR WITHOUT PELVIS) 2-3V LEFT  COMPARISON:  None.  FINDINGS: The patient has sustained an acute fracture of the base of the neck of the left femur. The femoral head remains normally related to the acetabulum. The intertrochanteric region appears intact as does the subtrochanteric region.  IMPRESSION: Acute mildly angulated fracture through the neck of the left femur. No pelvic fracture is observed.  ROS: In addition to the HPI above,  Left hip pain and weakness+++ No Fever-chills, No Headache, No changes with Vision or hearing, No problems swallowing food or Liquids, No Chest pain, Cough or Shortness of Breath, No Abdominal pain, No Nausea or vomiting, Bowel movements are regular, No Blood in stool or Urine, No dysuria, No new skin rashes or  bruises, No new  tingling, numbness in any extremity, No recent weight gain or loss, No polyuria, polydypsia or polyphagia, No significant Mental Stressors.  Blood pressure 114/62, pulse 73, temperature 98.3 F (36.8 C), temperature source Oral, resp. rate 18, height 5\' 7"  (1.702 m), weight 61.1 kg, SpO2 94 %.  Physical Exam  Awake alert Obvious discomfort with movement LLE atrophied Slight shortening and ER Intact sensibility  UE - normal RLE - normal  Assessment: Displaced left hip, femoral neck fracture  Reviewed condition of left hip Indications reviewed, goals reviewed Risks reviewed infection, DVT, dislocation particularly as pertains to history of Polio effected this same side NPO after 9 am ( clears and black coffee allowed) Consent ordered  Post op PT eval and treat, disposition pending their assessment  Mauri Pole 02/13/2018, 10:21 AM

## 2018-02-13 NOTE — Op Note (Signed)
NAME:  Charles Randall                ACCOUNT NO.:  N/A   MEDICAL RECORD NO.: [id number]   LOCATION:  0175                         FACILITY:  [facility] OF BIRTH:  31-Jul-1927  PHYSICIAN:  Pietro Cassis. Alvan Dame, M.D.     DATE OF PROCEDURE:  02/13/2018                               OPERATIVE REPORT     PREOPERATIVE DIAGNOSIS:  Left displaced femoral neck fracture.   POSTOPERATIVE DIAGNOSIS:  Left displaced femoral neck fracture.   PROCEDURE:  Left hip hemiarthroplasty utilizing DePuy component, size 8 standard Actis femoral stem with a 76mm unipolar ball with a +0 adapter.   SURGEON:  Pietro Cassis. Alvan Dame, MD   ASSISTANT:  Danae Orleans, PA-C.   ANESTHESIA:  General.   SPECIMENS:  None.   DRAINS:  None.   BLOOD LOSS:  About 100 cc.   COMPLICATIONS:  None.   INDICATION OF PROCEDURE:  Charles Randall is a pleasant 84 year old male who lives independently.  He unfortunately had a fall at his house.  He was admitted to the hospital after radiographs revealed a femoral neck fracture.  He was seen and evaluated and was scheduled for surgery for fixation.  The necessity of surgical repair was discussed with she and her family.  Consent was obtained after reviewing risks of infection, DVT, component failure, and need for revision surgery.   PROCEDURE IN DETAIL:  The patient was brought to the operative theater. Once adequate anesthesia, preoperative antibiotics, 2 g of Ancef, 1 gm of Tranexamic Acid administered, the patient was positioned into the right lateral decubitus position with the left side up.  The left lower extremity was then prepped and draped in sterile fashion.  A time-out was performed identifying the patient, planned procedure, and extremity.   A lateral incision was made off the proximal trochanter. Sharp dissection was carried down to the iliotibial band and gluteal fascia. The gluteal fascia was then incised for posterior approach.  The short external rotators were taken  down separate from the posterior capsule. An L capsulotomy was made preserving the posterior leaflet for later anatomic repair. Fracture site was identified and after removing comminuted segments of the posterior femoral neck, the femoral head was removed without difficulty and measured on the back table  using the sizing rings and determined to be 56 mm in diameter.   The proximal femur was then exposed.  Retractors placed.  I then drilled, opened the proximal femur.  Then I hand reamed once and  Irrigated the canal to try to prevent fat emboli.  I began broaching the femur with a starter broach up to a size 8 broach with good medial and lateral metaphyseal fit without evidence of any torsion or movement.  A trial reduction was carried out with a standard offset neck and a +0 adapter with a 30mm ball.  The hip reduced nicely.  The leg lengths appeared to be equal compared to the down leg.   The hip went through a range of motion without evidence of any subluxation or impingement paying particular attention to this given his history of polio induce left lower extremity weakness.   Given these findings, the trial components  removed.  The final 8 standard Actis femoral stem was opened.  After irrigating the canal, the final stem was impacted and sat at the level where the broach was. Based on this and the trial reduction, a +0 adapter was opened and impacted in the 68mm unipolar ball onto a clean and dry trunnion.  The hip had been irrigated throughout the case and again at this point.  I re- Approximated the posterior capsule to the superior leaflet using a  #1 Vicryl.  The remainder of the wound was closed with #1 Vicryl in the iliotibial band and gluteal fascia, a  2-0 Vicryl in the sub-Q tissue and a running 4-0 Monocryl in the skin.  The hip was cleaned, dried, and dressed sterilely using Dermabond and Aquacel dressing.  He was then brought to recovery room, extubated in stable  condition, tolerating the procedure well.  Danae Orleans, PA-C was present and utilized as Environmental consultant for the entire case from  Preoperative positioning to management of the contralateral extremity and retractors to  General facilitation of the procedure.  He was also involved with primary wound closure.         Pietro Cassis Alvan Dame, M.D.

## 2018-02-13 NOTE — Anesthesia Preprocedure Evaluation (Signed)
Anesthesia Evaluation  Patient identified by MRN, date of birth, ID band Patient awake    Reviewed: Allergy & Precautions, NPO status , Patient's Chart, lab work & pertinent test results  Airway Mallampati: II  TM Distance: >3 FB Neck ROM: Full    Dental no notable dental hx.    Pulmonary neg pulmonary ROS, former smoker,    Pulmonary exam normal breath sounds clear to auscultation       Cardiovascular hypertension, Pt. on medications Normal cardiovascular exam+ Valvular Problems/Murmurs MR  Rhythm:Regular Rate:Normal     Neuro/Psych Seizures -, Poorly Controlled,  negative psych ROS   GI/Hepatic negative GI ROS, Neg liver ROS,   Endo/Other  negative endocrine ROS  Renal/GU negative Renal ROS  negative genitourinary   Musculoskeletal negative musculoskeletal ROS (+)   Abdominal   Peds negative pediatric ROS (+)  Hematology negative hematology ROS (+)   Anesthesia Other Findings   Reproductive/Obstetrics negative OB ROS                             Anesthesia Physical Anesthesia Plan  ASA: II  Anesthesia Plan: Spinal   Post-op Pain Management:    Induction:   PONV Risk Score and Plan: 1 and Treatment may vary due to age or medical condition  Airway Management Planned: Simple Face Mask  Additional Equipment:   Intra-op Plan:   Post-operative Plan:   Informed Consent: I have reviewed the patients History and Physical, chart, labs and discussed the procedure including the risks, benefits and alternatives for the proposed anesthesia with the patient or authorized representative who has indicated his/her understanding and acceptance.   Dental advisory given  Plan Discussed with: CRNA  Anesthesia Plan Comments:         Anesthesia Quick Evaluation

## 2018-02-13 NOTE — H&P (View-Only) (Signed)
Reason for Consult: Left hip fracture Referring Physician: Algis Liming, MD  [name] is an 84 y.o. male.  HPI: Charles Randall  is a 84 y.o. male, with history of hypertension, moderate aortic regurgitation, seizure disorder, chronic kidney disease stage III (baseline creatinine around 1.5) who was brought to the ED after sustaining a fall last night in his kitchen.  Patient reports that his legs gave away and landed on his left hip.  He had just returned from his girlfriends house who lives across the street.  He denied any loss of consciousness or hitting his head.  At baseline he ambulates with some support or uses a walker.  He was not using the walker when he had a fall and was heading over to the kitchen to get to his walker.  He was on the floor most of the night and called his girlfriend this morning. Patient denies any headache, blurred vision, dizziness, chest pain, palpitations, shortness of breath, nausea, vomiting, abdominal pain, dysuria, diarrhea, tingling or numbness of his extremities.  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  He uses a walker now over the past few years but had been "very active" otherwise prior  No other injuries to report just left hip pain   Past Medical History:  Diagnosis Date  . Anemia    STARTED PILLS 1 MONTH AGO NEW DX  . Aortic insufficiency    mild, moderate  . Arthritis    FINGERS SWELL PT THINKS ARTHITIS, TOP OF WRIST ALSO SWELLS ONCE IN A WHILE  . Bleeds easily ([place])    BRUISE EASY ALSO  . Chronic kidney disease   . Cough    LAST 3 TO 4 WEEKS PHLEGM YELLOW INTERMITTENT COUGH NO FEVER  . Dyslipidemia   . H/O dizziness    OCC   . H/O Doppler ultrasound 2010  . H/O echocardiogram 2013   aortic valve disorder, EF =>55%  . H/O echocardiogram 2012   AVD, EF =>55%  . Heart murmur   . History of hiatal hernia   . HTN (hypertension)   . Inguinal hernia   . Mitral regurgitation    mild  . Pneumonia FEW YEARS  AGO   X 1  . Post-polio syndrome age 89  . Seizures ([place])    last sz 2016 NO CAUSE FOUND   . Swelling of both lower extremities    DOES NOT ALWAYS DO DOWN   . Tricuspid regurgitation    mild    Past Surgical History:  Procedure Laterality Date  . COLONSCOPY  5 YRS AGO  . DENTAL SURGERY  3-4 WEEKS AGO  . FEMORAL HERNIA REPAIR Right 08/17/2017   Procedure: HERNIA REPAIR FEMORAL LAPAROSCOPIC;  Surgeon: Michael Boston, MD;  Location: [facility];  Service: General;  Laterality: Right;  . INGUINAL HERNIA REPAIR Bilateral 08/17/2017   Procedure: LAPAROSCOPIC BILATERAL INGUINAL HERNIA REPAIR;  Surgeon: Michael Boston, MD;  Location: [facility];  Service: General;  Laterality: Bilateral;  . INSERTION OF MESH Bilateral 08/17/2017   Procedure: INSERTION OF MESH;  Surgeon: Michael Boston, MD;  Location: [facility];  Service: General;  Laterality: Bilateral;    Family History  Problem Relation Age of Onset  . Cancer Mother   . Heart disease Father   . Cancer Maternal Grandmother   . Cancer Maternal Grandfather   . Kidney failure Sister     Social History:  reports that he quit smoking about 54 years ago. He has a  7.50 pack-year smoking history. He has never used smokeless tobacco. He reports that he does not drink alcohol or use drugs.  Allergies: No Known Allergies  Medications:  I have reviewed the patient's current medications. Scheduled: . cholecalciferol  1,000 Units Oral Daily  . enoxaparin (LOVENOX) injection  30 mg Subcutaneous QHS  . levETIRAcetam  1,500 mg Oral BID  . magnesium oxide  200 mg Oral Daily  . vitamin B-12  1,000 mcg Oral Daily  . vitamin C  1,000 mg Oral Daily  . vitamin E  400 Units Oral Daily    Results for orders placed or performed during the hospital encounter of 02/12/18 (from the past 24 hour(s))  Basic metabolic panel     Status: Abnormal   Collection Time: 02/12/18 10:51 AM  Result Value Ref Range   Sodium 140 135 - 145 mmol/L   Potassium 3.8 3.5 - 5.1 mmol/L    Chloride 104 98 - 111 mmol/L   CO2 26 22 - 32 mmol/L   Glucose, Bld 122 (H) 70 - 99 mg/dL   BUN 39 (H) 8 - 23 mg/dL   Creatinine, Ser 1.55 (H) 0.61 - 1.24 mg/dL   Calcium 9.1 8.9 - 10.3 mg/dL   GFR calc non Af Amer 39 (L) >60 mL/min   GFR calc Af Amer 46 (L) >60 mL/min   Anion gap 10 5 - 15  CBC WITH DIFFERENTIAL     Status: Abnormal   Collection Time: 02/12/18 10:51 AM  Result Value Ref Range   WBC 14.3 (H) 4.0 - 10.5 K/uL   RBC 4.08 (L) 4.22 - 5.81 MIL/uL   Hemoglobin 12.1 (L) 13.0 - 17.0 g/dL   HCT 37.4 (L) 39.0 - 52.0 %   MCV 91.7 80.0 - 100.0 fL   MCH 29.7 26.0 - 34.0 pg   MCHC 32.4 30.0 - 36.0 g/dL   RDW 12.9 11.5 - 15.5 %   Platelets 216 150 - 400 K/uL   nRBC 0.0 0.0 - 0.2 %   Neutrophils Relative % 86 %   Neutro Abs 12.4 (H) 1.7 - 7.7 K/uL   Lymphocytes Relative 5 %   Lymphs Abs 0.6 (L) 0.7 - 4.0 K/uL   Monocytes Relative 8 %   Monocytes Absolute 1.2 (H) 0.1 - 1.0 K/uL   Eosinophils Relative 0 %   Eosinophils Absolute 0.0 0.0 - 0.5 K/uL   Basophils Relative 0 %   Basophils Absolute 0.0 0.0 - 0.1 K/uL   Immature Granulocytes 1 %   Abs Immature Granulocytes 0.07 0.00 - 0.07 K/uL  Protime-INR     Status: None   Collection Time: 02/12/18 10:51 AM  Result Value Ref Range   Prothrombin Time 14.4 11.4 - 15.2 seconds   INR 1.12   Type and screen [place]     Status: None   Collection Time: 02/12/18 10:51 AM  Result Value Ref Range   ABO/RH(D) O NEG    Antibody Screen NEG    Sample Expiration      02/15/2018 Performed at [facility], [place] [address], [place], [place] [address]   ABO/Rh     Status: None   Collection Time: 02/12/18 11:02 AM  Result Value Ref Range   ABO/RH(D)      Jenetta Downer NEG Performed at [facility] [address], [place], [place] [address]   CK     Status: None   Collection Time: 02/12/18 11:15 AM  Result Value Ref Range  Total CK 217 49 - 397 U/L  Surgical pcr screen     Status: None    Collection Time: 02/13/18  4:50 AM  Result Value Ref Range   MRSA, PCR NEGATIVE NEGATIVE   Staphylococcus aureus NEGATIVE NEGATIVE  Basic metabolic panel     Status: Abnormal   Collection Time: 02/13/18  6:05 AM  Result Value Ref Range   Sodium 138 135 - 145 mmol/L   Potassium 3.5 3.5 - 5.1 mmol/L   Chloride 106 98 - 111 mmol/L   CO2 24 22 - 32 mmol/L   Glucose, Bld 96 70 - 99 mg/dL   BUN 39 (H) 8 - 23 mg/dL   Creatinine, Ser 1.27 (H) 0.61 - 1.24 mg/dL   Calcium 8.4 (L) 8.9 - 10.3 mg/dL   GFR calc non Af Amer 50 (L) >60 mL/min   GFR calc Af Amer 58 (L) >60 mL/min   Anion gap 8 5 - 15  CBC     Status: Abnormal   Collection Time: 02/13/18  6:05 AM  Result Value Ref Range   WBC 10.7 (H) 4.0 - 10.5 K/uL   RBC 3.56 (L) 4.22 - 5.81 MIL/uL   Hemoglobin 10.6 (L) 13.0 - 17.0 g/dL   HCT 33.4 (L) 39.0 - 52.0 %   MCV 93.8 80.0 - 100.0 fL   MCH 29.8 26.0 - 34.0 pg   MCHC 31.7 30.0 - 36.0 g/dL   RDW 12.9 11.5 - 15.5 %   Platelets 165 150 - 400 K/uL   nRBC 0.0 0.0 - 0.2 %    X-ray: CLINICAL DATA:  Patient fell last night striking the left side and was unable to get up and now with left hip pain.  EXAM: DG HIP (WITH OR WITHOUT PELVIS) 2-3V LEFT  COMPARISON:  None.  FINDINGS: The patient has sustained an acute fracture of the base of the neck of the left femur. The femoral head remains normally related to the acetabulum. The intertrochanteric region appears intact as does the subtrochanteric region.  IMPRESSION: Acute mildly angulated fracture through the neck of the left femur. No pelvic fracture is observed.  ROS: In addition to the HPI above,  Left hip pain and weakness+++ No Fever-chills, No Headache, No changes with Vision or hearing, No problems swallowing food or Liquids, No Chest pain, Cough or Shortness of Breath, No Abdominal pain, No Nausea or vomiting, Bowel movements are regular, No Blood in stool or Urine, No dysuria, No new skin rashes or  bruises, No new  tingling, numbness in any extremity, No recent weight gain or loss, No polyuria, polydypsia or polyphagia, No significant Mental Stressors.  Blood pressure 114/62, pulse 73, temperature 98.3 F (36.8 C), temperature source Oral, resp. rate 18, height 5\' 7"  (1.702 m), weight 61.1 kg, SpO2 94 %.  Physical Exam  Awake alert Obvious discomfort with movement LLE atrophied Slight shortening and ER Intact sensibility  UE - normal RLE - normal  Assessment: Displaced left hip, femoral neck fracture  Reviewed condition of left hip Indications reviewed, goals reviewed Risks reviewed infection, DVT, dislocation particularly as pertains to history of Polio effected this same side NPO after 9 am ( clears and black coffee allowed) Consent ordered  Post op PT eval and treat, disposition pending their assessment  Mauri Pole 02/13/2018, 10:21 AM

## 2018-02-13 NOTE — Transfer of Care (Signed)
Immediate Anesthesia Transfer of Care Note  Patient: Charles Randall.  Procedure(s) Performed: HEMI  ARTHROPLASTY (Left Hip)  Patient Location: PACU  Anesthesia Type:MAC and Spinal  Level of Consciousness: awake, alert  and patient cooperative  Airway & Oxygen Therapy: Patient Spontanous Breathing and Patient connected to face mask oxygen  Post-op Assessment: Report given to RN and Post -op Vital signs reviewed and stable  Post vital signs: Reviewed and stable  Last Vitals:  Vitals Value Taken Time  BP 102/67 02/13/2018  8:00 PM  Temp    Pulse 85 02/13/2018  8:02 PM  Resp 16 02/13/2018  8:02 PM  SpO2 100 % 02/13/2018  8:02 PM  Vitals shown include unvalidated device data.  Last Pain:  Vitals:   02/13/18 1420  TempSrc: Oral  PainSc:       Patients Stated Pain Goal: 2 (00/34/91 7915)  Complications: No apparent anesthesia complications

## 2018-02-13 NOTE — Care Management Note (Signed)
Case Management Note  Patient Details  Name: Charles Randall. MRN: [id number] Date of Birth: 11-12-1927  Subjective/Objective: Displaced l femoral fx. Ortho following for surgery. From home. Has rw. Await post surgery. Recc PT eval. Likely need SNF.                   Action/Plan:d/c SNF.   Expected Discharge Date:  (unknown)               Expected Discharge Plan:  [place]  In-House Referral:     Discharge planning Services  CM Consult  Post Acute Care Choice:  Durable Medical Equipment(rw) Choice offered to:     DME Arranged:    DME Agency:     HH Arranged:    HH Agency:     Status of Service:  In process, will continue to follow  If discussed at Long Length of Stay Meetings, dates discussed:    Additional Comments:  Dessa Phi, RN 02/13/2018, 1:32 PM

## 2018-02-13 NOTE — Anesthesia Procedure Notes (Signed)
Procedure Name: MAC Date/Time: 02/13/2018 6:17 PM Performed by: West Pugh, CRNA Pre-anesthesia Checklist: Patient identified, Emergency Drugs available, Suction available, Patient being monitored and Timeout performed Patient Re-evaluated:Patient Re-evaluated prior to induction Oxygen Delivery Method: Simple face mask Preoxygenation: Pre-oxygenation with 100% oxygen Induction Type: IV induction Placement Confirmation: positive ETCO2 and breath sounds checked- equal and bilateral Dental Injury: Teeth and Oropharynx as per pre-operative assessment

## 2018-02-13 NOTE — Discharge Instructions (Signed)

## 2018-02-13 NOTE — Progress Notes (Signed)
PROGRESS NOTE   Charles Randall.  YTK:[id number]    DOB: 11-22-1927    DOA: 02/12/2018  PCP: Nickola Major, MD   I have briefly reviewed patients previous medical records in [facility].  Brief Narrative:  84 year old male, lives alone, unsteady gait and ambulates with the help of a walker, PMH of HTN, moderate aortic regurgitation, seizure disorder, stage III chronic kidney disease (baseline creatinine 1.5), HLD presented to ED after mechanical fall at home and sustained left hip fracture.  He returned from his friend's house and was reaching out to his walker when he lost balance and fell to the floor.  He reportedly stayed on the floor most of the night and called his girlfriend in the morning.  Orthopedics was consulted and surgery planned for today.   Assessment & Plan:   Active Problems:   Benign essential hypertension   Seizure disorder (HCC)   Partial symptomatic epilepsy with complex partial seizures, not intractable, without status epilepticus ([place])   Closed displaced fracture of left femoral neck (HCC)   Moderate aortic regurgitation   CKD (chronic kidney disease), stage III ([place])   Displaced fracture of left femoral neck (HCC)   Left displaced femoral neck fracture ([place])   1. Displaced left hip femoral neck fracture: Sustained status post mechanical fall at home.  Orthopedics was consulted and plan surgical fixation today.  Preop clearance provided.  Further management per orthopedics. 2. Acute blood loss anemia: Likely due to fracture.  Hemoglobin has dropped from 12.1-10.6.  Follow CBC in a.m.  Transfuse if hemoglobin 7 g or less. 3. Essential hypertension: Controlled.  Holding HCTZ-lisinopril for now.  PRN IV hydralazine. 4. HLD 5. Stage III chronic kidney disease: Baseline creatinine reportedly at 1.5.  Holding HCTZ and lisinopril.  Creatinine is improved to 1.27.  Follow BMP in a.m. 6. Seizure disorder: Continue Keppra. 7. Moderate aortic regurgitation: Denies  history of chest discomfort or dyspnea or syncope or presyncope with or without activity. 8. B12 deficiency   DVT prophylaxis: Lovenox Code Status: DNR Family Communication: Discussed in detail with patient's daughter at bedside. Disposition: To be determined postop.  Possibly SNF.   Consultants:  Orthopedic  Procedures:  None  Antimicrobials:  None   Subjective: Seen this morning prior to procedure.  Reports left hip pain most when he moves it.  Otherwise not much pain.  No other complaints.  Lives in a 1 level house and ambulates with the help of a walker both in and out of his house.  Unsteady gait but no prior falls.  No dyspnea, chest pain, palpitations, dizziness or lightheadedness reported.  ROS: As above.  Objective:  Vitals:   02/12/18 2007 02/12/18 2200 02/13/18 0436 02/13/18 1420  BP: 110/62 125/73 114/62 121/70  Pulse: 75 78 73 77  Resp: 16 16 18 18   Temp:  98.4 F (36.9 C) 98.3 F (36.8 C) 98.4 F (36.9 C)  TempSrc:  Oral Oral Oral  SpO2: 95% 97% 94% 96%  Weight:  61.1 kg    Height:  5\' 7"  (1.702 m)      Examination:  General exam: Pleasant elderly male, moderately built and nourished lying comfortably supine in bed.  Oral mucosa moist. Respiratory system: Clear to auscultation. Respiratory effort normal. Cardiovascular system: S1 & S2 heard, RRR. No JVD, rubs, gallops or clicks. No pedal edema.  Soft systolic murmur grade 2 x 6 best heard at apex. Gastrointestinal system: Abdomen is nondistended, soft and nontender. No organomegaly or  masses felt. Normal bowel sounds heard. Central nervous system: Alert and oriented. No focal neurological deficits. Extremities: Symmetric 5 x 5 power except left lower extremity movements restricted due to pain in left hip. Skin: No rashes, lesions or ulcers Psychiatry: Judgement and insight appear normal. Mood & affect appropriate.     Data Reviewed: I have personally reviewed following labs and imaging  studies  CBC: Recent Labs  Lab 02/12/18 1051 02/13/18 0605  WBC 14.3* 10.7*  NEUTROABS 12.4*  --   HGB 12.1* 10.6*  HCT 37.4* 33.4*  MCV 91.7 93.8  PLT 216 696   Basic Metabolic Panel: Recent Labs  Lab 02/12/18 1051 02/13/18 0605  NA 140 138  K 3.8 3.5  CL 104 106  CO2 26 24  GLUCOSE 122* 96  BUN 39* 39*  CREATININE 1.55* 1.27*  CALCIUM 9.1 8.4*   Coagulation Profile: Recent Labs  Lab 02/12/18 1051  INR 1.12   Cardiac Enzymes: Recent Labs  Lab 02/12/18 1115  CKTOTAL 217     Recent Results (from the past 240 hour(s))  Surgical pcr screen     Status: None   Collection Time: 02/13/18  4:50 AM  Result Value Ref Range Status   MRSA, PCR NEGATIVE NEGATIVE Final   Staphylococcus aureus NEGATIVE NEGATIVE Final    Comment: (NOTE) The Xpert SA Assay (FDA approved for NASAL specimens in patients 52 years of age and older), is one component of a comprehensive surveillance program. It is not intended to diagnose infection nor to guide or monitor treatment. Performed at [facility], [place] [address], [place], [place] [address]          Radiology Studies: Dg Chest Port 1 View  Result Date: 02/12/2018 CLINICAL DATA:  Preop clearance EXAM: PORTABLE CHEST 1 VIEW COMPARISON:  CT 04/16/2015 FINDINGS: Tortuous atherosclerotic aorta. Normal heart size. Clear lungs. No acute osseous abnormality. IMPRESSION: 1. No active disease. 2. Mild aortic atherosclerosis and ectasia. Electronically Signed   By: Ashley Royalty M.D.   On: 02/12/2018 15:52   Dg Hip Unilat With Pelvis 2-3 Views Left  Result Date: 02/12/2018 CLINICAL DATA:  Patient fell last night striking the left side and was unable to get up and now with left hip pain. EXAM: DG HIP (WITH OR WITHOUT PELVIS) 2-3V LEFT COMPARISON:  None. FINDINGS: The patient has sustained an acute fracture of the base of the neck of the left femur. The femoral head remains normally related to the acetabulum. The  intertrochanteric region appears intact as does the subtrochanteric region. IMPRESSION: Acute mildly angulated fracture through the neck of the left femur. No pelvic fracture is observed. Electronically Signed   By: David  Martinique M.D.   On: 02/12/2018 11:41        Scheduled Meds: . [MAR Hold] cholecalciferol  1,000 Units Oral Daily  . [MAR Hold] enoxaparin (LOVENOX) injection  40 mg Subcutaneous QHS  . [MAR Hold] levETIRAcetam  1,500 mg Oral BID  . [MAR Hold] magnesium oxide  200 mg Oral Daily  . [MAR Hold] vitamin B-12  1,000 mcg Oral Daily  . [MAR Hold] vitamin C  1,000 mg Oral Daily  . [MAR Hold] vitamin E  400 Units Oral Daily   Continuous Infusions: . sodium chloride 75 mL/hr at 02/13/18 1127  . sodium chloride    . [START ON 02/14/2018]  ceFAZolin (ANCEF) IV    . [MAR Hold] methocarbamol (ROBAXIN) IV    . phenylephrine    . tranexamic acid  LOS: 1 day     Vernell Leep, MD, FACP, [facility]. [facility] Pager [phone]  If 7PM-7AM, please contact night-coverage [email or web address] Password [facility] 02/13/2018, 6:41 PM

## 2018-02-14 ENCOUNTER — Inpatient Hospital Stay (HOSPITAL_COMMUNITY): Payer: Medicare Other

## 2018-02-14 ENCOUNTER — Encounter (HOSPITAL_COMMUNITY): Payer: Self-pay | Admitting: Orthopedic Surgery

## 2018-02-14 LAB — BASIC METABOLIC PANEL
Anion gap: 12 (ref 5–15)
BUN: 37 mg/dL — ABNORMAL HIGH (ref 8–23)
CO2: 22 mmol/L (ref 22–32)
Calcium: 7.9 mg/dL — ABNORMAL LOW (ref 8.9–10.3)
Chloride: 103 mmol/L (ref 98–111)
Creatinine, Ser: 1.26 mg/dL — ABNORMAL HIGH (ref 0.61–1.24)
GFR calc Af Amer: 59 mL/min — ABNORMAL LOW (ref >60)
GFR calc non Af Amer: 51 mL/min — ABNORMAL LOW (ref >60)
Glucose, Bld: 150 mg/dL — ABNORMAL HIGH (ref 70–99)
Potassium: 3.8 mmol/L (ref 3.5–5.1)
Sodium: 137 mmol/L (ref 135–145)

## 2018-02-14 LAB — CBC
HEMATOCRIT: 31.7 % — AB (ref 39.0–52.0)
Hemoglobin: 10.2 g/dL — ABNORMAL LOW (ref 13.0–17.0)
MCH: 30.3 pg (ref 26.0–34.0)
MCHC: 32.2 g/dL (ref 30.0–36.0)
MCV: 94.1 fL (ref 80.0–100.0)
Platelets: 157 10*3/uL (ref 150–400)
RBC: 3.37 MIL/uL — ABNORMAL LOW (ref 4.22–5.81)
RDW: 12.8 % (ref 11.5–15.5)
WBC: 9.6 10*3/uL (ref 4.0–10.5)
nRBC: 0 % (ref 0.0–0.2)

## 2018-02-14 LAB — BRAIN NATRIURETIC PEPTIDE: B NATRIURETIC PEPTIDE 5: 226.2 pg/mL — AB (ref 0.0–100.0)

## 2018-02-14 MED ORDER — ACETAMINOPHEN 325 MG PO TABS
650.0000 mg | ORAL_TABLET | Freq: Four times a day (QID) | ORAL | Status: DC | PRN
  Administered 2018-02-14 – 2018-02-18 (×2): 650 mg via ORAL
  Filled 2018-02-14 (×2): qty 2

## 2018-02-14 MED ORDER — SODIUM CHLORIDE 0.9 % IV SOLN
3.0000 g | Freq: Three times a day (TID) | INTRAVENOUS | Status: DC
  Administered 2018-02-14 – 2018-02-16 (×5): 3 g via INTRAVENOUS
  Filled 2018-02-14 (×6): qty 3

## 2018-02-14 MED ORDER — ACETAMINOPHEN 325 MG PO TABS: 650.0000 mg | ORAL_TABLET | Freq: Four times a day (QID) | ORAL | Status: DC | PRN

## 2018-02-14 NOTE — Progress Notes (Addendum)
     Subjective: 1 Day Post-Op Procedure(s) (LRB): HEMI  ARTHROPLASTY (Left)   Patient reports pain as mild, with regards to the left hip.  He states that he is having significant pain in the right chest wall that is causing him difficulty with taking a deep breath.  He is wondering if he hit the chest when he fell.  Otherwise he feels that he is doing well.    Objective:   VITALS:   Vitals:   02/14/18 0223 02/14/18 0605  BP: 106/71 126/68  Pulse: 78 73  Resp: 18 19  Temp: 98.2 F (36.8 C) 98.3 F (36.8 C)  SpO2: 97% 97%    Dorsiflexion/Plantar flexion intact Incision: dressing C/D/I No cellulitis present Compartment soft  LABS Recent Labs    02/12/18 1051 02/13/18 0605 02/14/18 0631  HGB 12.1* 10.6* 10.2*  HCT 37.4* 33.4* 31.7*  WBC 14.3* 10.7* 9.6  PLT 216 165 157    Recent Labs    02/12/18 1051 02/13/18 0605 02/14/18 0631  NA 140 138 137  K 3.8 3.5 3.8  BUN 39* 39* 37*  CREATININE 1.55* 1.27* 1.26*  GLUCOSE 122* 96 150*     Assessment/Plan: 1 Day Post-Op Procedure(s) (LRB): HEMI  ARTHROPLASTY (Left) Advance diet Up with therapy D/C IV fluids Discharge disposition TBD CXR reviewed which revealed no acute rib abnormalities.   Ortho recommendations:  ASA 81 mg bid for 4 weeks for anticoagulation, unless other medically indicated.  Norco for pain management (Rx written).  MiraLax and Colace for constipation  Iron 325 mg tid for 2-3 weeks   WBAT on the left leg.  Dressing to remain in place until follow in clinic in 2 weeks.  Dressing is waterproof and may shower with it in place.  Follow up in 2 weeks at [facility]. Follow up with OLIN,Azarian Starace D in 2 weeks.  Contact information:  [facility] [address] [place] Kayleb Warshaw   PAC  02/14/2018, 11:49 AM

## 2018-02-14 NOTE — Evaluation (Signed)
Physical Therapy Evaluation Patient Details Name: Charles Randall. MRN: [id number] DOB: [birth year] Today's Date: 02/14/2018   History of Present Illness  Charles Randall  is a 84 y.o. male, with history of hypertension, moderate aortic regurgitation, seizure disorder, chronic kidney disease stage III (baseline creatinine around 1.5) who was brought to the ED after sustaining a fall.  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  Now s/p L hip hemiarthroplasty.    Clinical Impression  Patient presents with decreased mobility due to pain, weakness, decreased balance, decreased activity tolerance and decreased cardiorespiratory endurance.  Previously patient was ambulating with walker independently, currently needs +2 A with walker for OOB transfer.  Feel he will need SNF level rehab upon d/c.  PT to follow acutely.    Follow Up Recommendations SNF;Supervision/Assistance - 24 hour    Equipment Recommendations  None recommended by PT    Recommendations for Other Services       Precautions / Restrictions Precautions Precautions: Fall;Posterior Hip Precaution Comments: reviewed with pt and wrote on white board in room Restrictions Weight Bearing Restrictions: Yes LLE Weight Bearing: Weight bearing as tolerated      Mobility  Bed Mobility Overal bed mobility: Needs Assistance Bed Mobility: Supine to Sit     Supine to sit: +2 for physical assistance;Max assist     General bed mobility comments: used R leg to assist with scooting some, assist to move L leg and heavy assist to lift trunk  Transfers Overall transfer level: Needs assistance Equipment used: Rolling walker (2 wheeled) Transfers: Sit to/from [facility] Sit to Stand: Max assist;+2 physical assistance Stand pivot transfers: Mod assist;+2 physical assistance       General transfer comment: lifting assist to stand, once standing and balanced assist to take small step with R foot to  pivot, brought chair up behind him  Ambulation/Gait                Stairs            Wheelchair Mobility    Modified Rankin (Stroke Patients Only)       Balance Overall balance assessment: Needs assistance Sitting-balance support: Bilateral upper extremity supported Sitting balance-Leahy Scale: Poor Sitting balance - Comments: UE support needed in sitting     Standing balance-Leahy Scale: Poor Standing balance comment: UE support and assist for balance                             Pertinent Vitals/Pain Pain Assessment: Faces Faces Pain Scale: Hurts whole lot Pain Location: L hip with movement and R ribs with deep breath Pain Descriptors / Indicators: Sore;Jabbing;Grimacing;Guarding Pain Intervention(s): Repositioned;Monitored during session;Ice applied    Home Living Family/patient expects to be discharged to:: Private residence Living Arrangements: Alone Available Help at Discharge: Friend(s);Available 24 hours/day Type of Home: House Home Access: Stairs to enter Entrance Stairs-Rails: Right Entrance Stairs-Number of Steps: 6 Home Layout: One level Home Equipment: Walker - 2 wheels      Prior Function Level of Independence: Independent with assistive device(s);Needs assistance   Gait / Transfers Assistance Needed: ambulated independent wiht walker  ADL's / Homemaking Assistance Needed: girlfriend cooks and cleans for him and drives        Hand Dominance        Extremity/Trunk Assessment   Upper Extremity Assessment Upper Extremity Assessment: Overall WFL for tasks assessed    Lower Extremity Assessment Lower  Extremity Assessment: RLE deficits/detail;LLE deficits/detail RLE Deficits / Details: AROM WFL, pain with lifting at hip due to pain at ribs on R, strength knee extension 4/5; foot drop on R as well LLE Deficits / Details: AAROM limited by pain, positive quad set, very limited ankle movement due to h/o polio        Communication   Communication: No difficulties  Cognition Arousal/Alertness: Awake/alert Behavior During Therapy: WFL for tasks assessed/performed Overall Cognitive Status: Within Functional Limits for tasks assessed                                        General Comments General comments (skin integrity, edema, etc.): daughter and girlfriend in the room; SpO2 82% on RA at rest, placed on 2LPM O2 with SpO2 90% after 2 minutes, cues for PLB    Exercises General Exercises - Lower Extremity Ankle Circles/Pumps: AROM;10 reps;Supine;Both Quad Sets: AROM;5 reps;Left;Supine Heel Slides: AROM;5 reps;Right;Left;Supine   Assessment/Plan    PT Assessment Patient needs continued PT services  PT Problem List Decreased range of motion;Decreased mobility;Decreased activity tolerance;Decreased balance;Decreased strength;Pain       PT Treatment Interventions DME instruction;Functional mobility training;Balance training;Patient/family education;Gait training;Therapeutic activities;Therapeutic exercise    PT Goals (Current goals can be found in the Care Plan section)  Acute Rehab PT Goals Patient Stated Goal: agreeable to rehab, but wanted to go home PT Goal Formulation: With patient/family Time For Goal Achievement: 02/28/18 Potential to Achieve Goals: Fair    Frequency Min 3X/week   Barriers to discharge        Co-evaluation               AM-PAC PT "6 Clicks" Mobility  Outcome Measure Help needed turning from your back to your side while in a flat bed without using bedrails?: A Lot Help needed moving from lying on your back to sitting on the side of a flat bed without using bedrails?: Total Help needed moving to and from a bed to a chair (including a wheelchair)?: A Lot Help needed standing up from a chair using your arms (e.g., wheelchair or bedside chair)?: Total Help needed to walk in hospital room?: Total Help needed climbing 3-5 steps with a railing? :  Total 6 Click Score: 8    End of Session Equipment Utilized During Treatment: Gait belt Activity Tolerance: Patient limited by pain Patient left: with call bell/phone within reach;in chair;with family/visitor present Nurse Communication: Mobility status PT Visit Diagnosis: Other abnormalities of gait and mobility (R26.89);Pain;Difficulty in walking, not elsewhere classified (R26.2) Pain - Right/Left: Left Pain - part of body: Hip    Time: 7035-0093 PT Time Calculation (min) (ACUTE ONLY): 37 min   Charges:   PT Evaluation $PT Eval Moderate Complexity: 1 Mod PT Treatments $Therapeutic Activity: 8-22 mins        Magda Kiel, [place] [facility] [phone] 02/14/2018   Reginia Naas 02/14/2018, 2:34 PM

## 2018-02-14 NOTE — Progress Notes (Signed)
Patient [employer] auth still pending.   LCSW will follow up in the morning.   [place] CSW [phone]'

## 2018-02-14 NOTE — Progress Notes (Signed)
Pharmacy Antibiotic Note  Charles Randall. is a 84 y.o. male admitted on 02/12/2018 with fall now s/p L hip hemi arthroplasty.  Pharmacy has been consulted for unasyn dosing for aspiration PNA.  Plan: Unasyn 3 gm IV q 8h Pharmacy to sign off  Height: 5\' 7"  (170.2 cm) Weight: 134 lb 11.2 oz (61.1 kg) IBW/kg (Calculated) : 66.1  Temp (24hrs), Avg:98.2 F (36.8 C), Min:97.8 F (36.6 C), Max:98.7 F (37.1 C)  Recent Labs  Lab 02/12/18 1051 02/13/18 0605 02/14/18 0631  WBC 14.3* 10.7* 9.6  CREATININE 1.55* 1.27* 1.26*    Estimated Creatinine Clearance: 35 mL/min (A) (by C-G formula based on SCr of 1.26 mg/dL (H)).    No Known Allergies  Thank you for allowing pharmacy to be a part of this patient's care.  Eudelia Bunch, Pharm.D [phone] 02/14/2018 5:55 PM

## 2018-02-14 NOTE — Progress Notes (Signed)
Pt's face appears very flushed, rectal temp showing 100.6. No PRN Tylenol ordered- MD Florene Glen paged. Pt has voided a measured 100 ml since FC removed. Pt very certain he urinated another time today but this was unwitnessed. Bladder scan showing only 186 ml. MD Florene Glen aware. Will continue to monitor and make oncoming shift aware.

## 2018-02-14 NOTE — Anesthesia Postprocedure Evaluation (Signed)
Anesthesia Post Note  Patient: Charles Randall.  Procedure(s) Performed: HEMI  ARTHROPLASTY (Left Hip)     Patient location during evaluation: PACU Anesthesia Type: Spinal Level of consciousness: awake and alert Pain management: pain level controlled Vital Signs Assessment: post-procedure vital signs reviewed and stable Respiratory status: spontaneous breathing and respiratory function stable Cardiovascular status: blood pressure returned to baseline and stable Postop Assessment: no headache, no backache, spinal receding and no apparent nausea or vomiting Anesthetic complications: no    Last Vitals:  Vitals:   02/13/18 2100 02/13/18 2123  BP: 134/69 125/69  Pulse: 84 86  Resp: 15 16  Temp: 36.7 C 36.7 C  SpO2: 97% 96%    Last Pain:  Vitals:   02/13/18 2142  TempSrc:   PainSc: 0-No pain                 Montez Hageman

## 2018-02-14 NOTE — Progress Notes (Signed)
PROGRESS NOTE    Charles Randall.  UDJ:[id number] DOB: [birth year] DOA: 02/12/2018 PCP: Nickola Major, MD   Brief Narrative:  84 year old male, lives alone, unsteady gait and ambulates with the help of a walker, PMH of HTN, moderate aortic regurgitation, seizure disorder, stage III chronic kidney disease (baseline creatinine 1.5), HLD presented to ED after mechanical fall at home and sustained left hip fracture.  He returned from his friend's house and was reaching out to his walker when he lost balance and fell to the floor.  He reportedly stayed on the floor most of the night and called his girlfriend in the morning.  Orthopedics was consulted and surgery planned for today.  Assessment & Plan:   Active Problems:   Benign essential hypertension   Seizure disorder (HCC)   Partial symptomatic epilepsy with complex partial seizures, not intractable, without status epilepticus ([place])   Closed displaced fracture of left femoral neck (HCC)   Moderate aortic regurgitation   CKD (chronic kidney disease), stage III ([place])   Displaced fracture of left femoral neck (HCC)   Left displaced femoral neck fracture ([place])   1. Displaced left hip femoral neck fracture: Sustained status post mechanical fall at home. 1. S/p L hip hemiarthroplasty on 12/17 by ortho 2. PT/OT 3. ASA BID per ortho for DVT ppx 4. WBAT 5. Follow up with Dr. Alvan Dame in 2 weeks 6. Pain control with norco, APAP prn  2. Pleuritic CP  Post Op Fever:  1. CXR with findings concerning for bibasilar pneumonia.  ? Aspiration. 2. Blood cx, unasyn 3. Elevated BNP, concern for CHF as well, follow echo  3. Acute blood loss anemia: Likely due to fracture.   1. Continue iron 2. Follow iron panel, b12, folate, ferritin  3. Relatively stable, follow  4. Essential hypertension: Controlled.  Holding HCTZ-lisinopril for now.  PRN IV hydralazine.  5. HLD  6. Stage III chronic kidney disease: Baseline creatinine reportedly at 1.5.   Holding HCTZ and lisinopril.  Continue to follow, creatinine below baseline.  7. Seizure disorder: Continue Keppra.  8. Moderate aortic regurgitation: Denies history of chest discomfort or dyspnea or syncope or presyncope with or without activity.  9. B12 deficiency   DVT prophylaxis: ASA BID Code Status: DNR Family Communication: daughter and sig other  Disposition Plan: pending further improvement, SNF placement   Consultants:   orthopedics  Procedures:  L hip hemiarthroplasty 12/17  Antimicrobials:  Anti-infectives (From admission, onward)   Start     Dose/Rate Route Frequency Ordered Stop   02/14/18 1800  Ampicillin-Sulbactam (UNASYN) 3 g in sodium chloride 0.9 % 100 mL IVPB     3 g 200 mL/hr over 30 Minutes Intravenous Every 8 hours 02/14/18 1756     02/14/18 0600  ceFAZolin (ANCEF) IVPB 2g/100 mL premix     2 g 200 mL/hr over 30 Minutes Intravenous On call to O.R. 02/13/18 1423 02/13/18 1857   02/14/18 0030  ceFAZolin (ANCEF) IVPB 2g/100 mL premix     2 g 200 mL/hr over 30 Minutes Intravenous Every 6 hours 02/13/18 2120 02/14/18 0631     Subjective: Complains of pleuritic CP on L side Otherwise no complaints. Pain is controlled. Daughter and sig o at bedside.  Objective: Vitals:   02/14/18 1400 02/14/18 1825 02/14/18 1830 02/14/18 2009  BP: 118/75   106/61  Pulse: 77   80  Resp: 18   18  Temp: 98.7 F (37.1 C) 100 F (37.8 C) (!) 100.6 F (38.1  C) 98.9 F (37.2 C)  TempSrc: Oral Axillary Rectal Oral  SpO2: 98%   100%  Weight:      Height:        Intake/Output Summary (Last 24 hours) at 02/14/2018 2023 Last data filed at 02/14/2018 1900 Gross per 24 hour  Intake 2273.89 ml  Output 405 ml  Net 1868.89 ml   Filed Weights   02/12/18 2200  Weight: 61.1 kg    Examination:  General exam: Appears calm and comfortable  Respiratory system: Clear to auscultation. Respiratory effort normal. Cardiovascular system: S1 & S2 heard,  RRR. Gastrointestinal system: Abdomen is nondistended, soft and nontender.  Central nervous system: Alert and oriented. No focal neurological deficits. Extremities: Moving all extremities.  RLE with dressing intact. Skin: No rashes, lesions or ulcers Psychiatry: Judgement and insight appear normal. Mood & affect appropriate.     Data Reviewed: I have personally reviewed following labs and imaging studies  CBC: Recent Labs  Lab 02/12/18 1051 02/13/18 0605 02/14/18 0631  WBC 14.3* 10.7* 9.6  NEUTROABS 12.4*  --   --   HGB 12.1* 10.6* 10.2*  HCT 37.4* 33.4* 31.7*  MCV 91.7 93.8 94.1  PLT 216 165 888   Basic Metabolic Panel: Recent Labs  Lab 02/12/18 1051 02/13/18 0605 02/14/18 0631  NA 140 138 137  K 3.8 3.5 3.8  CL 104 106 103  CO2 26 24 22   GLUCOSE 122* 96 150*  BUN 39* 39* 37*  CREATININE 1.55* 1.27* 1.26*  CALCIUM 9.1 8.4* 7.9*   GFR: Estimated Creatinine Clearance: 35 mL/min (A) (by C-G formula based on SCr of 1.26 mg/dL (H)). Liver Function Tests: No results for input(s): AST, ALT, ALKPHOS, BILITOT, PROT, ALBUMIN in the last 168 hours. No results for input(s): LIPASE, AMYLASE in the last 168 hours. No results for input(s): AMMONIA in the last 168 hours. Coagulation Profile: Recent Labs  Lab 02/12/18 1051  INR 1.12   Cardiac Enzymes: Recent Labs  Lab 02/12/18 1115  CKTOTAL 217   BNP (last 3 results) No results for input(s): PROBNP in the last 8760 hours. HbA1C: No results for input(s): HGBA1C in the last 72 hours. CBG: No results for input(s): GLUCAP in the last 168 hours. Lipid Profile: No results for input(s): CHOL, HDL, LDLCALC, TRIG, CHOLHDL, LDLDIRECT in the last 72 hours. Thyroid Function Tests: No results for input(s): TSH, T4TOTAL, FREET4, T3FREE, THYROIDAB in the last 72 hours. Anemia Panel: No results for input(s): VITAMINB12, FOLATE, FERRITIN, TIBC, IRON, RETICCTPCT in the last 72 hours. Sepsis Labs: No results for input(s):  PROCALCITON, LATICACIDVEN in the last 168 hours.  Recent Results (from the past 240 hour(s))  Surgical pcr screen     Status: None   Collection Time: 02/13/18  4:50 AM  Result Value Ref Range Status   MRSA, PCR NEGATIVE NEGATIVE Final   Staphylococcus aureus NEGATIVE NEGATIVE Final    Comment: (NOTE) The Xpert SA Assay (FDA approved for NASAL specimens in patients 54 years of age and older), is one component of a comprehensive surveillance program. It is not intended to diagnose infection nor to guide or monitor treatment. Performed at [facility], [place] [address], [place], [place] [address]          Radiology Studies: Dg Chest 2 View  Result Date: 02/14/2018 CLINICAL DATA:  Status post hip surgery yesterday. The patient is complaining of right-sided abdominal and chest discomfort. EXAM: CHEST - 2 VIEW COMPARISON:  Portable chest x-ray of February 12, 2018  FINDINGS: There is new increased density in the right lower lobe posteriorly. There is subtle increased density in the left retrocardiac region as well. The cardiac silhouette is mildly enlarged. The pulmonary vascularity is not clearly engorged. There is calcification in the wall of the aortic arch. The bony thorax exhibits no acute abnormality. IMPRESSION: Findings worrisome for bibasilar pneumonia possibly due to aspiration. There is a small right pleural effusion. Subtle increased prominence of the pulmonary interstitium and increased cardiac silhouette size may reflect low-grade CHF as well. Electronically Signed   By: David  Martinique M.D.   On: 02/14/2018 14:48   Pelvis Portable  Result Date: 02/13/2018 CLINICAL DATA:  Left hip hemiarthroplasty. EXAM: PORTABLE PELVIS 1-2 VIEWS COMPARISON:  Left hip x-rays from yesterday. FINDINGS: The left hip demonstrates a hemiarthroplasty without evidence of hardware failure or complication. There is expected intra-articular air. There is no fracture or dislocation. The  alignment is anatomic. Post-surgical changes noted in the surrounding soft tissues. IMPRESSION: Interval left hip hemiarthroplasty without evidence of acute postoperative complication. Electronically Signed   By: Titus Dubin M.D.   On: 02/13/2018 20:27        Scheduled Meds: . aspirin EC  325 mg Oral BID  . cholecalciferol  1,000 Units Oral Daily  . docusate sodium  100 mg Oral BID  . ferrous sulfate  325 mg Oral TID PC  . levETIRAcetam  1,500 mg Oral BID  . magnesium oxide  200 mg Oral Daily  . vitamin B-12  1,000 mcg Oral Daily  . vitamin C  1,000 mg Oral Daily  . vitamin E  400 Units Oral Daily   Continuous Infusions: . sodium chloride 75 mL/hr at 02/14/18 1230  . ampicillin-sulbactam (UNASYN) IV 3 g (02/14/18 1821)  . methocarbamol (ROBAXIN) IV       LOS: 2 days    Time spent: over 30 min    Fayrene Helper, MD [facility] Pager [phone]  If 7PM-7AM, please contact night-coverage [email or web address] Password [facility] 02/14/2018, 8:23 PM

## 2018-02-14 NOTE — Progress Notes (Signed)
Pt tolerated clear liquids throughout night- requesting Full Liquids for breakfast. Diet advanced per previous order.

## 2018-02-15 ENCOUNTER — Inpatient Hospital Stay (HOSPITAL_COMMUNITY): Payer: Medicare Other

## 2018-02-15 LAB — FOLATE: FOLATE: 12.9 ng/mL (ref >5.9)

## 2018-02-15 LAB — BASIC METABOLIC PANEL
Anion gap: 7 (ref 5–15)
BUN: 37 mg/dL — ABNORMAL HIGH (ref 8–23)
CO2: 23 mmol/L (ref 22–32)
CREATININE: 1.17 mg/dL (ref 0.61–1.24)
Calcium: 8 mg/dL — ABNORMAL LOW (ref 8.9–10.3)
Chloride: 107 mmol/L (ref 98–111)
GFR calc non Af Amer: 55 mL/min — ABNORMAL LOW (ref >60)
Glucose, Bld: 90 mg/dL (ref 70–99)
Potassium: 3.7 mmol/L (ref 3.5–5.1)
Sodium: 137 mmol/L (ref 135–145)

## 2018-02-15 LAB — IRON AND TIBC
Iron: 21 ug/dL — ABNORMAL LOW (ref 45–182)
Saturation Ratios: 14 % — ABNORMAL LOW (ref 17.9–39.5)
TIBC: 149 ug/dL — ABNORMAL LOW (ref 250–450)
UIBC: 128 ug/dL

## 2018-02-15 LAB — MAGNESIUM: Magnesium: 2 mg/dL (ref 1.7–2.4)

## 2018-02-15 LAB — CBC
HCT: 31.6 % — ABNORMAL LOW (ref 39.0–52.0)
Hemoglobin: 10.2 g/dL — ABNORMAL LOW (ref 13.0–17.0)
MCH: 29.9 pg (ref 26.0–34.0)
MCHC: 32.3 g/dL (ref 30.0–36.0)
MCV: 92.7 fL (ref 80.0–100.0)
Platelets: 179 10*3/uL (ref 150–400)
RBC: 3.41 MIL/uL — ABNORMAL LOW (ref 4.22–5.81)
RDW: 12.9 % (ref 11.5–15.5)
WBC: 8.4 10*3/uL (ref 4.0–10.5)
nRBC: 0 % (ref 0.0–0.2)

## 2018-02-15 LAB — FERRITIN: Ferritin: 430 ng/mL — ABNORMAL HIGH (ref 24–336)

## 2018-02-15 LAB — VITAMIN B12: VITAMIN B 12: 587 pg/mL (ref 180–914)

## 2018-02-15 MED ORDER — FERROUS SULFATE 325 (65 FE) MG PO TABS
325.0000 mg | ORAL_TABLET | Freq: Every day | ORAL | Status: DC
  Filled 2018-02-15 (×4): qty 1

## 2018-02-15 NOTE — Care Management Important Message (Signed)
Important Message  Patient Details  Name: Charles Randall. MRN: [id number] Date of Birth: April 11, 1927   Medicare Important Message Given:  Yes    [name] 02/15/2018, 12:30 PM

## 2018-02-15 NOTE — Progress Notes (Signed)
**Note De-Identified vi Obfusction** PROGRESS NOTE    [name]  ZSW:[id number] DOB: 07-Dec-1927 DOA: 02/12/2018 PCP: Nickol Mjor, MD   Brief Nrrtive:  34 yer old mle, lives lone, unstedy git nd mbultes with the help of  wlker, PMH of HTN, moderte ortic regurgittion, seizure disorder, stge III chronic kidney disese (bseline cretinine 1.5), HLD presented to ED fter mechnicl fll t home nd sustined left hip frcture.  He returned from his friend's house nd ws reching out to his wlker when he lost blnce nd fell to the floor.  He reportedly styed on the floor most of the night nd clled his girlfriend in the morning.  Orthopedics ws consulted nd surgery plnned for tody.  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 Displced left hip femorl neck frcture: Sustined sttus post mechnicl fll t home. 1. S/p L hip hemirthroplsty on 12/17 by ortho 2. PT/OT -> recommending SNF 3. ASA BID per ortho for DVT ppx 4. WBAT 5. Follow up with Dr. Alvn Dme in 2 weeks 6. Pin control with norco, APAP prn  2. Pleuritic CP  Post Op Fever:  1. CXR with findings concerning for bibsilr pneumoni.  ? Aspirtion. 2. Blood cx, unsyn 3. Elevted BNP, concern for CHF s well, but doesn't pper cliniclly overloded.  Echo in 09/2017 with grde 1 distolic dysfunction.  Will not repet t this time.  Continue to monitor closely. 4. Seen by speech, recommending further evlution with esophgrm, ordered  3. Acute blood loss nemi: Likely due to frcture.  Lbs suggestive of iron def with AOCD. 1. Continue  iron 2. Reltively stble, follow  4. Essentil hypertension: Controlled.  Holding HCTZ-lisinopril for now.  PRN IV hydrlzine.  5. HLD  6. Stge III chronic kidney disese: Bseline cretinine reportedly t 1.5.  Holding HCTZ nd lisinopril.  Continue to follow, cretinine below bseline.  7. Seizure disorder: Continue Keppr.  8. Moderte ortic regurgittion: Denies history of chest discomfort or dyspne or syncope or presyncope with or without ctivity.  9. B12 deficiency   DVT prophylxis: ASA BID Code Sttus: DNR Fmily Communiction: dughter  Disposition Pln: pending further improvement, SNF plcement   Consultnts:   orthopedics  Procedures:  L hip hemirthroplsty 12/17  Antimicrobils:  Anti-infectives (From dmission, onwrd)   Strt     Dose/Rte Route Frequency Ordered Stop   02/14/18 1800  Ampicillin-Sulbctm (UNASYN) 3 g in sodium chloride 0.9 % 100 mL IVPB     3 g 200 mL/hr over 30 Minutes Intrvenous Every 8 hours 02/14/18 1756     02/14/18 0600  ceFAZolin (ANCEF) IVPB 2g/100 mL premix     2 g 200 mL/hr over 30 Minutes Intrvenous On cll to O.R. 02/13/18 1423 02/13/18 1857   02/14/18 0030  ceFAZolin (ANCEF) IVPB 2g/100 mL premix     2 g 200 mL/hr over 30 Minutes Intrvenous Every 6 hours 02/13/18 2120 02/14/18 0631     Subjective: Persistent R sided pleuritic CP. Otherwise, L hip feels ok.  Objective: Vitls:   02/14/18 1830 02/14/18 2009 02/15/18 0443 02/15/18 1235  BP:  106/61 103/62 130/74  Pulse: **Note De-Identified vi Obfusction** 80 67 78  Resp:  18 20 20   Temp: (!) 100.6 F (38.1 C) 98.9 F (37.2 C) 97.7 F (36.5 C) 98.2 F (36.8 C)  TempSrc: Rectl Orl Orl Orl  SpO2:  100% 94% 94%  Weight:      Height:        Intke/Output Summry (Lst 24 hours) t 02/15/2018 1856 Lst dt filed t 02/15/2018 1709 Gross per 24 hour  Intke 1445.74 ml  Output 1100 ml  Net 345.74 ml   Filed Weights   02/12/18 2200  Weight: 61.1 kg    Exmintion:  Generl:  No cute distress. Crdiovsculr: Hert sounds show  regulr rte, nd rhythm. Lungs: Cler to usculttion bilterlly Abdomen: Soft, nontender, nondistended  Neurologicl: Alert nd oriented 3. Moves ll extremities 4 . Crnil nerves II through XII grossly intct. Skin: Wrm nd dry. No rshes or lesions. Extremities: LLE with dressing intct Psychitric: Mood nd ffect re norml. Insight nd judgment re pproprite.    Dt Reviewed: I hve personlly reviewed following lbs nd imging studies  CBC: Recent Lbs  Lb 02/12/18 1051 02/13/18 0605 02/14/18 0631 02/15/18 0549  WBC 14.3* 10.7* 9.6 8.4  NEUTROABS 12.4*  --   --   --   HGB 12.1* 10.6* 10.2* 10.2*  HCT 37.4* 33.4* 31.7* 31.6*  MCV 91.7 93.8 94.1 92.7  PLT 216 165 157 026   Bsic Metbolic Pnel: Recent Lbs  Lb 02/12/18 1051 02/13/18 0605 02/14/18 0631 02/15/18 0549  NA 140 138 137 137  K 3.8 3.5 3.8 3.7  CL 104 106 103 107  CO2 26 24 22 23   GLUCOSE 122* 96 150* 90  BUN 39* 39* 37* 37*  CREATININE 1.55* 1.27* 1.26* 1.17  CALCIUM 9.1 8.4* 7.9* 8.0*  MG  --   --   --  2.0   GFR: Estimted Cretinine Clernce: 37.7 mL/min (by [employer] formul bsed on SCr of 1.17 mg/dL). Liver Function Tests: No results for input(s): AST, ALT, ALKPHOS, BILITOT, PROT, ALBUMIN in the lst 168 hours. No results for input(s): LIPASE, AMYLASE in the lst 168 hours. No results for input(s): AMMONIA in the lst 168 hours. Cogultion Profile: Recent Lbs  Lb 02/12/18 1051  INR 1.12   Crdic Enzymes: Recent Lbs  Lb 02/12/18 1115  CKTOTAL 217   BNP (lst 3 results) No results for input(s): PROBNP in the lst 8760 hours. HbA1C: No results for input(s): HGBA1C in the lst 72 hours. CBG: No results for input(s): GLUCAP in the lst 168 hours. Lipid Profile: No results for input(s): CHOL, HDL, LDLCALC, TRIG, CHOLHDL, LDLDIRECT in the lst 72 hours. Thyroid Function Tests: No results for input(s): TSH,  T4TOTAL, FREET4, T3FREE, THYROIDAB in the lst 72 hours. Anemi Pnel: Recent Lbs    02/15/18 0549  VITAMINB12 587  FOLATE 12.9  FERRITIN 430*  TIBC 149*  IRON 21*   Sepsis Lbs: No results for input(s): PROCALCITON, LATICACIDVEN in the lst 168 hours.  Recent Results (from the pst 240 hour(s))  Surgicl pcr screen     Sttus: None   Collection Time: 02/13/18  4:50 AM  Result Vlue Ref Rnge Sttus   MRSA, PCR NEGATIVE NEGATIVE Finl   Stphylococcus ureus NEGATIVE NEGATIVE Finl    Comment: (NOTE) The Xpert SA Assy (FDA pproved for NASAL specimens in ptients 34 yers of ge nd older), is one component of  comprehensive surveillnce progrm. It is not intended to dignose infection nor to guide or monitor tretment. Performed t [facility] **Note De-Identified vi Obfusction** [facility], [place] [address] [employer], [place] [address]   Culture, blood (routine x 2)     Sttus: None (Preliminry result)   Collection Time: 02/14/18  6:16 PM  Result Vlue Ref Rnge Sttus   Specimen Description   Finl    BLOOD RIGHT ANTECUBITAL Performed t [facility] [address] [place], [place] [address]    Specil Requests   Finl    BOTTLES DRAWN AEROBIC AND ANAEROBIC Blood Culture dequte volume Performed t [facility] [address] [place], [place] [address]    Culture   Finl    NO GROWTH < 12 HOURS Performed t [facility] [address], [place], [place] [address]    Report Sttus PENDING  Incomplete  Culture, blood (routine x 2)     Sttus: None (Preliminry result)   Collection Time: 02/14/18  6:24 PM  Result Vlue Ref Rnge Sttus   Specimen Description   Finl    BLOOD RIGHT WRIST Performed t [facility] [address], [place], [place] [address]    Specil Requests   Finl    BOTTLES DRAWN AEROBIC ONLY Blood Culture results my not be optiml due to n indequte volume of blood received in culture bottles    Culture PENDING  Incomplete   Report Sttus PENDING  Incomplete         Rdiology Studies: Dg Chest 2 View  Result Dte: 02/14/2018 CLINICAL DATA:  Sttus post hip surgery yesterdy. The ptient is complining of right-sided bdominl nd chest discomfort. EXAM: CHEST - 2 VIEW COMPARISON:  Port[place] chest x-ry of February 12, 2018 FINDINGS: There is new incresed density in the right lower lobe posteriorly. There is subtle incresed density in the left retrocrdic region s well. The crdic silhouette is mildly enlrged. The pulmonry vsculrity is not clerly engorged. There is clcifiction in the wll of the ortic rch. The bony thorx exhibits no cute bnormlity. IMPRESSION: Findings worrisome for bibsilr pneumoni possibly due to spirtion. There is  smll right pleurl effusion. Subtle incresed prominence of the pulmonry interstitium nd incresed crdic silhouette size my reflect low-grde CHF s well. Electroniclly Signed   By: Dvid  Mrtinique M.D.   On: 02/14/2018 14:48   Pelvis [place]  Result Dte: 02/13/2018 CLINICAL DATA:  Left hip hemirthroplsty. EXAM: PORTABLE PELVIS 1-2 VIEWS COMPARISON:  Left hip x-rys from yesterdy. FINDINGS: The left hip demonstrtes  hemirthroplsty without evidence of hrdwre filure or compliction. There is expected intr-rticulr ir. There is no frcture or disloction. The lignment is ntomic. Post-surgicl chnges noted in the surrounding soft tissues. IMPRESSION: Intervl left hip hemirthroplsty without evidence of cute postopertive compliction. Electroniclly Signed   By: Titus Dubin M.D.   On: 02/13/2018 20:27        Scheduled Meds: . spirin EC  325 mg Orl BID  . choleclciferol  1,000 Units Orl Dily  . docuste sodium  100 mg Orl BID  . [START ON 02/16/2018] ferrous sulfte  325 mg Orl Q brekfst  . levETIRAcetm  1,500 mg Orl BID  . mgnesium oxide  200 mg Orl Dily  . vitmin B-12  1,000 mcg  Orl Dily  . vitmin C  1,000 mg Orl Dily  . vitmin E  400 Units Orl Dily   Continuous Infusions: . sodium chloride Stopped (02/15/18 0254)  . mpicillin-sulbctm (UNASYN) IV 3 g (02/15/18 1708)  . methocrbmol (ROBAXIN) IV       LOS: 3 dys Time spent: over 30 min    Fayrene Helper, MD [facility] Pager [phone]  If 7PM-7AM, please contact night-coverage [email or web address] Password [facility] 02/15/2018, 6:56 PM

## 2018-02-15 NOTE — Progress Notes (Signed)
Physical Therapy Treatment Patient Details Name: Charles Randall. MRN: [id number] DOB: 1927/07/14 Today's Date: 02/15/2018    History of Present Illness Charles Randall  is a 84 y.o. male, with history of hypertension, moderate aortic regurgitation, seizure disorder, chronic kidney disease stage III (baseline creatinine around 1.5) who was brought to the ED after sustaining a fall.  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  Now s/p L hip hemiarthroplasty.      PT Comments    POD # 3 LEFT  Hemiarthroplasty  Long time girl friend of 33 years [name] present during session.  Assisted pt OOB to amb using EVA walker.  General bed mobility comments: used R leg to assist with scooting some, assist to move L leg and heavy assist to lift trunk.  Used bed pad to complete scooting to EOB.  General Gait Details: used B platform EVA walker this session for increased support and to increase amb distance as well as upright stance time.   Pt will need ST Rehab at SNF prior to retuning home.    Follow Up Recommendations  SNF;Supervision/Assistance - 24 hour     Equipment Recommendations  None recommended by PT    Recommendations for Other Services       Precautions / Restrictions Precautions Precautions: Fall;Posterior Hip Precaution Comments: reviewed THP with pt, Hx Polio with shortened L leg length  Restrictions Weight Bearing Restrictions: No LLE Weight Bearing: Weight bearing as tolerated    Mobility  Bed Mobility Overal bed mobility: Needs Assistance Bed Mobility: Supine to Sit     Supine to sit: +2 for physical assistance;Max assist     General bed mobility comments: used R leg to assist with scooting some, assist to move L leg and heavy assist to lift trunk.  Used bed pad to complete scooting to EOB  Transfers Overall transfer level: Needs assistance Equipment used: Bilateral platform walker(EVA walker ) Transfers: Sit to/from [facility] Sit  to Stand: Max assist;+2 physical assistance Stand pivot transfers: Mod assist;+2 physical assistance       General transfer comment: from elevated bed pt required + 2 side bt side assist with 25% VC's on proper hand placement   Ambulation/Gait Ambulation/Gait assistance: Mod assist;+2 physical assistance;+2 safety/equipment Gait Distance (Feet): 12 Feet Assistive device: Bilateral platform walker(EVA walker ) Gait Pattern/deviations: Step-to pattern;Decreased stance time - left;Decreased step length - right Gait velocity: decreased    General Gait Details: used B platform EVA walker this session for increased support and to increase amb distance as well as upright stance time.     Stairs             Wheelchair Mobility    Modified Rankin (Stroke Patients Only)       Balance                                            Cognition Arousal/Alertness: Awake/alert Behavior During Therapy: WFL for tasks assessed/performed Overall Cognitive Status: Within Functional Limits for tasks assessed                                 General Comments: AxO x 4 and very motivated/Independant       Exercises      General Comments  Pertinent Vitals/Pain Pain Assessment: 0-10 Pain Score: 7  Pain Location: R side rib cage  Pain Descriptors / Indicators: Sore;Jabbing;Grimacing;Guarding Pain Intervention(s): Monitored during session    Home Living                      Prior Function            PT Goals (current goals can now be found in the care plan section) Progress towards PT goals: Progressing toward goals    Frequency    Min 3X/week      PT Plan Current plan remains appropriate    Co-evaluation              AM-PAC PT "6 Clicks" Mobility   Outcome Measure  Help needed turning from your back to your side while in a flat bed without using bedrails?: A Lot Help needed moving from lying on your back to sitting  on the side of a flat bed without using bedrails?: Total Help needed moving to and from a bed to a chair (including a wheelchair)?: Total Help needed standing up from a chair using your arms (e.g., wheelchair or bedside chair)?: Total Help needed to walk in hospital room?: Total Help needed climbing 3-5 steps with a railing? : Total 6 Click Score: 7    End of Session Equipment Utilized During Treatment: Gait belt Activity Tolerance: Patient tolerated treatment well Patient left: in chair;with call bell/phone within reach;with family/visitor present Nurse Communication: Mobility status PT Visit Diagnosis: Other abnormalities of gait and mobility (R26.89);Pain;Difficulty in walking, not elsewhere classified (R26.2) Pain - Right/Left: Left Pain - part of body: Hip     Time: 6803-2122 PT Time Calculation (min) (ACUTE ONLY): 26 min  Charges:  $Gait Training: 8-22 mins $Therapeutic Activity: 8-22 mins                     Rica Koyanagi  PTA Acute  Rehabilitation Services Pager      [phone] Office      [phone]

## 2018-02-15 NOTE — Evaluation (Signed)
SLP Cancellation Note  Patient Details Name: Charles Randall. MRN: [id number] DOB: Feb 02, 1928   Cancelled treatment:       Reason Eval/Treat Not Completed: Other (comment)(pt with case manager and nursing at this time) Will continue efforts.   Macario Golds 02/15/2018, 11:45 AM

## 2018-02-15 NOTE — CV Procedure (Signed)
Echocardiogram not completed, patient undergoing a swallow evaluation.  Darlina Sicilian RDCS

## 2018-02-15 NOTE — NC FL2 (Addendum)
[place] LEVEL OF CARE SCREENING TOOL     IDENTIFICATION  Patient Name: Charles Randall. Birthdate: [birth year] Sex: male Admission Date (Current Location): 02/12/2018  [facility] and [place] Number:  Herbalist and Address:  [facility] [place] [address] [place] [employer], [place] [address]      Provider Number: [id number]  Attending Physician Name and Address:  Elodia Florence., *  Relative Name and Phone Number:       Current Level of Care: Hospital Recommended Level of Care: [place] Prior Approval Number:    Date Approved/Denied:   PASRR Number: [id number]  Discharge Plan: SNF    Current Diagnoses: Patient Active Problem List   Diagnosis Date Noted  . Closed fracture of neck of left femur ([place]) 02/12/2018  . Moderate aortic regurgitation 02/12/2018  . CKD (chronic kidney disease), stage III ([place]) 02/12/2018  . Displaced fracture of left femoral neck ([place]) 02/12/2018  . Left displaced femoral neck fracture ([place]) 02/12/2018  . Bilateral inguinal hernia (BIH) s/p lap hernia repair w mesh 08/17/2017 08/17/2017  . Femoral hernia - right - s/p lap hernia repair w mesh 08/17/2017 08/17/2017  . Partial symptomatic epilepsy with complex partial seizures, not intractable, without status epilepticus ([place]) 11/16/2015  . Seizure disorder ([place]) 05/12/2015  . Edema of lower extremity 04/09/2015  . Benign essential hypertension 04/09/2015  . Pleural effusion, right 04/09/2015  . 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    Orientation RESPIRATION BLADDER Height & Weight     Self, Time, Situation, Place  O2 Continent Weight: 134 lb 11.2 oz (61.1 kg) Height:  5\' 7"  (170.2 cm)  BEHAVIORAL SYMPTOMS/MOOD NEUROLOGICAL BOWEL NUTRITION STATUS      Continent Diet(heart healthy )  AMBULATORY STATUS COMMUNICATION OF NEEDS Skin   Extensive Assist Verbally Surgical wounds(dressing on wound)                       Personal Care  Assistance Level of Assistance  Bathing, Feeding, Dressing Bathing Assistance: Maximum assistance Feeding assistance: Independent Dressing Assistance: Maximum assistance     Functional Limitations Info  Sight, Hearing, Speech Sight Info: Adequate Hearing Info: Adequate Speech Info: Adequate    SPECIAL CARE FACTORS FREQUENCY  PT (By licensed PT), OT (By licensed OT)     PT Frequency: 5x wk OT Frequency: 5x wk            Contractures Contractures Info: Not present    Additional Factors Info  Code Status, Allergies Code Status Info: DNR Allergies Info: No Known Allergies           Current Medications (02/15/2018):  This is the current hospital active medication list Current Facility-Administered Medications  Medication Dose Route Frequency Provider Last Rate Last Dose  . 0.9 %  sodium chloride infusion   Intravenous Continuous Danae Orleans, PA-C   Stopped at 02/15/18 0254  . acetaminophen (TYLENOL) tablet 650 mg  650 mg Oral Q6H PRN Elodia Florence., MD   650 mg at 02/14/18 1842  . Ampicillin-Sulbactam (UNASYN) 3 g in sodium chloride 0.9 % 100 mL IVPB  3 g Intravenous Q8H BellSharyn Lull T, RPH 200 mL/hr at 02/15/18 0857 3 g at 02/15/18 0857  . aspirin EC tablet 325 mg  325 mg Oral BID Danae Orleans, PA-C   325 mg at 02/15/18 0859  . cholecalciferol (VITAMIN D3) tablet 1,000 Units  1,000 Units Oral Daily Danae Orleans, PA-C   1,000 Units  at 02/15/18 0859  . docusate sodium (COLACE) capsule 100 mg  100 mg Oral BID Danae Orleans, PA-C   100 mg at 02/15/18 6606  . [START ON 02/16/2018] ferrous sulfate tablet 325 mg  325 mg Oral Q breakfast Elodia Florence., MD      . HYDROcodone-acetaminophen (NORCO/VICODIN) 5-325 MG per tablet 1-2 tablet  1-2 tablet Oral Q4H PRN Danae Orleans, PA-C   1 tablet at 02/15/18 0859  . levETIRAcetam (KEPPRA) tablet 1,500 mg  1,500 mg Oral BID Danae Orleans, PA-C   1,500 mg at 02/15/18 0859  . magnesium oxide (MAG-OX) tablet  200 mg  200 mg Oral Daily Danae Orleans, PA-C   200 mg at 02/15/18 0858  . menthol-cetylpyridinium (CEPACOL) lozenge 3 mg  1 lozenge Oral PRN Danae Orleans, PA-C       Or  . phenol (CHLORASEPTIC) mouth spray 1 spray  1 spray Mouth/Throat PRN Babish, Rodman Key, PA-C      . methocarbamol (ROBAXIN) tablet 500 mg  500 mg Oral Q6H PRN Danae Orleans, PA-C   500 mg at 02/12/18 2133   Or  . methocarbamol (ROBAXIN) 500 mg in dextrose 5 % 50 mL IVPB  500 mg Intravenous Q6H PRN Babish, Matthew, PA-C      . metoCLOPramide (REGLAN) tablet 5-10 mg  5-10 mg Oral Q8H PRN Danae Orleans, PA-C       Or  . metoCLOPramide (REGLAN) injection 5-10 mg  5-10 mg Intravenous Q8H PRN Babish, Matthew, PA-C      . morphine 2 MG/ML injection 0.5 mg  0.5 mg Intravenous Q2H PRN Babish, Matthew, PA-C      . ondansetron (ZOFRAN) tablet 4 mg  4 mg Oral Q6H PRN Danae Orleans, PA-C       Or  . ondansetron (ZOFRAN) injection 4 mg  4 mg Intravenous Q6H PRN Babish, Matthew, PA-C      . polyethylene glycol (MIRALAX / GLYCOLAX) packet 17 g  17 g Oral Daily PRN Babish, Matthew, PA-C      . polyvinyl alcohol (LIQUIFILM TEARS) 1.4 % ophthalmic solution 1 drop  1 drop Both Eyes TID PRN Babish, Rodman Key, PA-C      . vitamin B-12 (CYANOCOBALAMIN) tablet 1,000 mcg  1,000 mcg Oral Daily Danae Orleans, PA-C   1,000 mcg at 02/15/18 0858  . vitamin C (ASCORBIC ACID) tablet 1,000 mg  1,000 mg Oral Daily Danae Orleans, PA-C   1,000 mg at 02/15/18 0857  . vitamin E capsule 400 Units  400 Units Oral Daily Danae Orleans, PA-C   400 Units at 02/15/18 3016     Discharge Medications: Please see discharge summary for a list of discharge medications.  Relevant Imaging Results:  Relevant Lab Results:   Additional Information SS# [id number]  Wende Neighbors, LCSW

## 2018-02-16 ENCOUNTER — Inpatient Hospital Stay (HOSPITAL_COMMUNITY): Payer: Medicare Other

## 2018-02-16 DIAGNOSIS — I361 Nonrheumatic tricuspid (valve) insufficiency: Secondary | ICD-10-CM

## 2018-02-16 DIAGNOSIS — I351 Nonrheumatic aortic (valve) insufficiency: Secondary | ICD-10-CM

## 2018-02-16 LAB — CBC
HCT: 30.8 % — ABNORMAL LOW (ref 39.0–52.0)
Hemoglobin: 9.9 g/dL — ABNORMAL LOW (ref 13.0–17.0)
MCH: 30.6 pg (ref 26.0–34.0)
MCHC: 32.1 g/dL (ref 30.0–36.0)
MCV: 95.1 fL (ref 80.0–100.0)
Platelets: 187 10*3/uL (ref 150–400)
RBC: 3.24 MIL/uL — ABNORMAL LOW (ref 4.22–5.81)
RDW: 12.8 % (ref 11.5–15.5)
WBC: 10.2 10*3/uL (ref 4.0–10.5)
nRBC: 0 % (ref 0.0–0.2)

## 2018-02-16 LAB — BASIC METABOLIC PANEL
Anion gap: 11 (ref 5–15)
BUN: 32 mg/dL — ABNORMAL HIGH (ref 8–23)
CALCIUM: 7.9 mg/dL — AB (ref 8.9–10.3)
CO2: 22 mmol/L (ref 22–32)
Chloride: 106 mmol/L (ref 98–111)
Creatinine, Ser: 1.11 mg/dL (ref 0.61–1.24)
GFR calc non Af Amer: 59 mL/min — ABNORMAL LOW (ref >60)
Glucose, Bld: 106 mg/dL — ABNORMAL HIGH (ref 70–99)
Potassium: 3.6 mmol/L (ref 3.5–5.1)
Sodium: 139 mmol/L (ref 135–145)

## 2018-02-16 LAB — ECHOCARDIOGRAM COMPLETE
Height: 67 in
Weight: 2155.2 oz

## 2018-02-16 MED ORDER — HYDROCODONE-ACETAMINOPHEN 5-325 MG PO TABS: 1.0000 | ORAL_TABLET | Freq: Four times a day (QID) | ORAL | 0 refills | Status: DC | PRN

## 2018-02-16 MED ORDER — FERROUS SULFATE 325 (65 FE) MG PO TABS: 325.0000 mg | ORAL_TABLET | Freq: Every day | ORAL | 0 refills | Status: DC

## 2018-02-16 MED ORDER — AMOXICILLIN-POT CLAVULANATE 875-125 MG PO TABS: 1.0000 | ORAL_TABLET | Freq: Two times a day (BID) | ORAL | 0 refills | Status: AC

## 2018-02-16 MED ORDER — AMOXICILLIN-POT CLAVULANATE 875-125 MG PO TABS
1.0000 | ORAL_TABLET | Freq: Two times a day (BID) | ORAL | Status: AC
  Administered 2018-02-16 – 2018-02-19 (×7): 1 via ORAL
  Filled 2018-02-16 (×7): qty 1

## 2018-02-16 NOTE — Progress Notes (Signed)
PROGRESS NOTE    Charles Randall.  LNL:[id number] DOB: 09-27-1927 DOA: 02/12/2018 PCP: Charles Major, MD   Brief Narrative:  84 year old male, lives alone, unsteady gait and ambulates with the help of Charles Randall walker, PMH of HTN, moderate aortic regurgitation, seizure disorder, stage III chronic kidney disease (baseline creatinine 1.5), HLD presented to ED after mechanical fall at home and sustained left hip fracture.  He returned from his friend's house and was reaching out to his walker when he lost balance and fell to the floor.  He reportedly stayed on the floor most of the night and called his girlfriend in the morning.  Orthopedics was consulted and surgery planned for today.  Planning for discharge on 12/20, but pt became hypoxic.  Currently addressing AHRF.  Assessment & Plan:   Active Problems:   Benign essential hypertension   Seizure disorder (HCC)   Partial symptomatic epilepsy with complex partial seizures, not intractable, without status epilepticus ([place])   Closed fracture of neck of left femur (HCC)   Moderate aortic regurgitation   CKD (chronic kidney disease), stage III ([place])   Displaced fracture of left femoral neck (HCC)   Left displaced femoral neck fracture (HCC)  # Acute Hypoxic Respiratory Failure: pt on RA this AM, but then desatted to the 80's.  Will try to wean again today.  Continue abx therapy.  Follow echo. If unable to wean O2, will follow CTA to r/o PE  1. Displaced left hip femoral neck fracture: Sustained status post mechanical fall at home. 1. S/p L hip hemiarthroplasty on 12/17 by ortho 2. PT/OT -> recommending SNF 3. ASA BID per ortho for DVT ppx 4. WBAT 5. Follow up with Dr. Alvan Dame in 2 weeks 6. Pain control with norco, APAP prn  2. Pleuritic CP  Post Op Fever:  1. CXR with findings concerning for bibasilar pneumonia.  ? Aspiration. 2. Blood cx, unasyn 3. Elevated BNP, concern for CHF as well, but doesn't appear clinically overloaded.  Echo in  09/2017 with grade 1 diastolic dysfunction.  Continue to monitor closely. 4. Seen by speech, recommending further evaluation with esophagram (moderate dysmotility, see recommendations)  3. Acute blood loss anemia: Likely due to fracture.  Labs suggestive of iron def with AOCD. 1. Continue iron 2. Relatively stable, follow  4. Essential hypertension: Controlled.  Holding HCTZ-lisinopril for now.  PRN IV hydralazine.  5. HLD  6. Stage III chronic kidney disease: Baseline creatinine reportedly at 1.5.  Holding HCTZ and lisinopril.  Continue to follow, creatinine below baseline.  7. Seizure disorder: Continue Keppra.  8. Moderate aortic regurgitation: Denies history of chest discomfort or dyspnea or syncope or presyncope with or without activity.  9. B12 deficiency   DVT prophylaxis: ASA BID Code Status: DNR Family Communication: daughter  Disposition Plan: pending further improvement, SNF placement   Consultants:   orthopedics  Procedures:  L hip hemiarthroplasty 12/17  Antimicrobials:  Anti-infectives (From admission, onward)   Start     Dose/Rate Route Frequency Ordered Stop   02/16/18 1045  amoxicillin-clavulanate (AUGMENTIN) 875-125 MG per tablet 1 tablet     1 tablet Oral Every 12 hours 02/16/18 1040 02/19/18 2159   02/16/18 0000  amoxicillin-clavulanate (AUGMENTIN) 875-125 MG tablet     1 tablet Oral Every 12 hours 02/16/18 1252 02/19/18 2359   02/14/18 1800  Ampicillin-Sulbactam (UNASYN) 3 g in sodium chloride 0.9 % 100 mL IVPB  Status:  Discontinued     3 g 200 mL/hr over 30 Minutes  Intravenous Every 8 hours 02/14/18 1756 02/16/18 1040   02/14/18 0600  ceFAZolin (ANCEF) IVPB 2g/100 mL premix     2 g 200 mL/hr over 30 Minutes Intravenous On call to O.R. 02/13/18 1423 02/13/18 1857   02/14/18 0030  ceFAZolin (ANCEF) IVPB 2g/100 mL premix     2 g 200 mL/hr over 30 Minutes Intravenous Every 6 hours 02/13/18 2120 02/14/18 0631     Subjective: Persistent pleuritic CP  with deep breaths. Otherwise, doing ok.  Objective: Vitals:   02/16/18 1338 02/16/18 1400 02/16/18 1402 02/16/18 1615  BP: (!) 148/75     Pulse: 89     Resp: 18     Temp: 99.6 F (37.6 C)     TempSrc: Oral     SpO2: 91% (!) 80% 94% 96%  Weight:      Height:        Intake/Output Summary (Last 24 hours) at 02/16/2018 1746 Last data filed at 02/16/2018 1714 Gross per 24 hour  Intake -  Output 425 ml  Net -425 ml   Filed Weights   02/12/18 2200  Weight: 61.1 kg    Examination:  General: No acute distress. Cardiovascular: Heart sounds show Mckena Chern regular rate, and rhythm Lungs: Clear to auscultation bilaterally  Abdomen: Soft, nontender, nondistended  Neurological: Alert and oriented 3. Moves all extremities 4. Cranial nerves II through XII grossly intact. Skin: Warm and dry. No rashes or lesions. Extremities: No clubbing or cyanosis. No edema Psychiatric: Mood and affect are normal. Insight and judgment are appropriate.    Data Reviewed: I have personally reviewed following labs and imaging studies  CBC: Recent Labs  Lab 02/12/18 1051 02/13/18 0605 02/14/18 0631 02/15/18 0549 02/16/18 0538  WBC 14.3* 10.7* 9.6 8.4 10.2  NEUTROABS 12.4*  --   --   --   --   HGB 12.1* 10.6* 10.2* 10.2* 9.9*  HCT 37.4* 33.4* 31.7* 31.6* 30.8*  MCV 91.7 93.8 94.1 92.7 95.1  PLT 216 165 157 179 588   Basic Metabolic Panel: Recent Labs  Lab 02/12/18 1051 02/13/18 0605 02/14/18 0631 02/15/18 0549 02/16/18 0538  NA 140 138 137 137 139  K 3.8 3.5 3.8 3.7 3.6  CL 104 106 103 107 106  CO2 26 24 22 23 22   GLUCOSE 122* 96 150* 90 106*  BUN 39* 39* 37* 37* 32*  CREATININE 1.55* 1.27* 1.26* 1.17 1.11  CALCIUM 9.1 8.4* 7.9* 8.0* 7.9*  MG  --   --   --  2.0  --    GFR: Estimated Creatinine Clearance: 39.8 mL/min (by C-G formula based on SCr of 1.11 mg/dL). Liver Function Tests: No results for input(s): AST, ALT, ALKPHOS, BILITOT, PROT, ALBUMIN in the last 168 hours. No results  for input(s): LIPASE, AMYLASE in the last 168 hours. No results for input(s): AMMONIA in the last 168 hours. Coagulation Profile: Recent Labs  Lab 02/12/18 1051  INR 1.12   Cardiac Enzymes: Recent Labs  Lab 02/12/18 1115  CKTOTAL 217   BNP (last 3 results) No results for input(s): PROBNP in the last 8760 hours. HbA1C: No results for input(s): HGBA1C in the last 72 hours. CBG: No results for input(s): GLUCAP in the last 168 hours. Lipid Profile: No results for input(s): CHOL, HDL, LDLCALC, TRIG, CHOLHDL, LDLDIRECT in the last 72 hours. Thyroid Function Tests: No results for input(s): TSH, T4TOTAL, FREET4, T3FREE, THYROIDAB in the last 72 hours. Anemia Panel: Recent Labs    02/15/18 0549  [id number] 583  FOLATE 12.9  FERRITIN 430*  TIBC 149*  IRON 21*   Sepsis Labs: No results for input(s): PROCALCITON, LATICACIDVEN in the last 168 hours.  Recent Results (from the past 240 hour(s))  Surgical pcr screen     Status: None   Collection Time: 02/13/18  4:50 AM  Result Value Ref Range Status   MRSA, PCR NEGATIVE NEGATIVE Final   Staphylococcus aureus NEGATIVE NEGATIVE Final    Comment: (NOTE) The Xpert SA Assay (FDA approved for NASAL specimens in patients 61 years of age and older), is one component of [facility]ont[facility] Tillis comprehensive surveillance program. It is not intended to diagnose infection nor to guide or monitor treatment. Performed at [facility], [place] [address], [place], [place] [address]   Culture, blood (routine x 2)     Status: None (Preliminary result)   Collection Time: 02/14/18  6:16 PM  Result Value Ref Range Status   Specimen Description   Final    BLOOD RIGHT ANTECUBITAL Performed at [facility] [address], [place], [place] [address]    Special Requests   Final    BOTTLES DRAWN AEROBIC AND ANAEROBIC Blood Culture adequate volume Performed at [facility] [address], [place], [place] [address]     Culture   Final    NO GROWTH 2 DAYS Performed at [facility] [address] [place], [place] [address]    Report Status PENDING  Incomplete  Culture, blood (routine x 2)     Status: None (Preliminary result)   Collection Time: 02/14/18  6:24 PM  Result Value Ref Range Status   Specimen Description   Final    BLOOD RIGHT WRIST Performed at [facility] [address], [place], [place] [address]    Special Requests   Final    BOTTLES DRAWN AEROBIC ONLY Blood Culture results may not be optimal due to an inadequate volume of blood received in culture bottles   Culture PENDING  Incomplete   Report Status PENDING  Incomplete         Radiology Studies: Dg Esophagus  Result Date: 02/16/2018 CLINICAL DATA:  Dysphagia EXAM: ESOPHOGRAM/BARIUM SWALLOW TECHNIQUE: Single contrast examination was performed using thin barium and barium tablet. FLUOROSCOPY TIME:  Fluoroscopy Time:  1 minutes 18 seconds Radiation Exposure Index (if provided by the fluoroscopic device): Number of Acquired Spot Images: 0 COMPARISON:  None. FINDINGS: Recent hip surgery with limited mobility. Moderate esophageal dysmotility. No stricture or mass. Pharyngeal swallowing normal. No aspiration or diverticulum. Small hiatal hernia Barium tablet did not pass into the stomach but made it to the distal esophagus. This is likely due to dysmotility and supine positioning. IMPRESSION: Moderate esophageal dysmotility. No stricture or mass. Small hiatal hernia. Barium tablet made to the distal esophagus but not did not pass into the stomach likely due to dysmotility and supine position. Electronically Signed   By: Franchot Gallo M.D.   On: 02/16/2018 10:11        Scheduled Meds: . amoxicillin-clavulanate  1 tablet Oral Q12H  . aspirin EC  325 mg Oral BID  . cholecalciferol  1,000 Units Oral Daily  . docusate sodium  100 mg Oral BID  . ferrous sulfate  325 mg Oral Q breakfast  . levETIRAcetam   1,500 mg Oral BID  . magnesium oxide  200 mg Oral Daily  . vitamin B-12  1,000 mcg Oral Daily  . vitamin C  1,000 mg Oral Daily  .  vitamin E  400 Units Oral Daily   Continuous Infusions: . methocarbamol (ROBAXIN) IV       LOS: 4 days    Time spent: over 30 min    Fayrene Helper, MD [facility] Pager [phone]  If 7PM-7AM, please contact night-coverage [email or web address] Password [facility] 02/16/2018, 5:46 PM

## 2018-02-16 NOTE — Progress Notes (Signed)
  Echocardiogram 2D Echocardiogram has been performed.  Charles Randall 02/16/2018, 3:01 PM

## 2018-02-16 NOTE — Clinical Social Work Placement (Signed)
   CLINICAL SOCIAL WORK PLACEMENT  NOTE  Date:  02/16/2018  Patient Details  Name: Charles Randall. MRN: [id number] Date of Birth: [birth year]  Clinical Social Work is seeking post-discharge placement for this patient at the [place] level of care (*CSW will initial, date and re-position this form in  chart as items are completed):  Yes   Patient/family provided with [place] Work Department's list of facilities offering this level of care within the geographic area requested by the patient (or if unable, by the patient's family).  Yes   Patient/family informed of their freedom to choose among providers that offer the needed level of care, that participate in Medicare, Medicaid or managed care program needed by the patient, have an available bed and are willing to accept the patient.  Yes   Patient/family informed of [place]'s ownership interest in [facility] and [facility], as well as of the fact that they are under no obligation to receive care at these facilities.  PASRR submitted to EDS on       PASRR number received on       Existing PASRR number confirmed on 02/16/18     FL2 transmitted to all facilities in geographic area requested by pt/family on 02/16/18     FL2 transmitted to all facilities within larger geographic area on       Patient informed that his/her managed care company has contracts with or will negotiate with certain facilities, including the following:            Patient/family informed of bed offers received.  [facility]     Physician recommends and patient chooses bed at      Patient to be transferred to [facility] on 02/16/18.  Patient to be transferred to facility by ptar     Patient family notified on 02/16/18 of transfer.  Name of family member notified:  spoke with pt significant other     PHYSICIAN       Additional Comment:     _______________________________________________ Wende Neighbors, LCSW 02/16/2018, 1:18 PM

## 2018-02-16 NOTE — Progress Notes (Signed)
Clinical Social Worker facilitated patient discharge including contacting patient family and facility to confirm patient discharge plans.  Clinical information faxed to facility and family agreeable with plan.  CSW arranged ambulance transport via [facility] to [facility] .  RN to call [phone] and ask for [facility] (Rm# [id number]) for report prior to discharge.  Clinical Social Worker will sign off for now as social work intervention is no longer needed. Please consult [place] again if new need arises.  Rhea Pink, MSW, [place]

## 2018-02-16 NOTE — Progress Notes (Signed)
  Speech Language Pathology Treatment: Dysphagia  Patient Details Name: Charles Randall. MRN: [id number] DOB: [birth year] Today's Date: 02/16/2018 Time: 4497-5300 SLP Time Calculation (min) (ACUTE ONLY): 15 min  Assessment / Plan / Recommendation Clinical Impression  Note results of esophagram - showing moderate dysmotility and tablet lodging at distal esophagus as well as small hiatal hernia.  Pt did not sense barium tablet lodging - he reports primary symptom of dysphagia sensing throat trying to close up since he had a hard time with fish oil.  SLP advised he use caution with medications, meats, etc most notably while acutely ill due to increased asp pna risk.    Provided written precautions/mitigation strategies for pt using teach back and return demonstration.  No SLP follow up indicated as all education completed.  Thanks for allowing me to help care for this pt.        HPI HPI: 84 year old male, lives alone, unsteady gait and ambulates with the help of a walker, PMH of HTN, moderate aortic regurgitation, seizure disorder, stage III chronic kidney disease (baseline creatinine 1.5), HLD presented to ED after mechanical fall at home and sustained left hip fracture.  He returned from his friend's house and was reaching out to his walker when he lost balance and fell to the floor.  He reportedly stayed on the floor most of the night and called his girlfriend in the morning.  Pt is s/p left hip hemiarthroplasty 02/14/18.  CXR concerning for bilateral pna, ? aspiration post- procedure compared to prior.  Pt PMH + for post polio, pleural effusion, seizure disorder.  Swallow eval ordered.  Pt lives alone and uses a walker.        SLP Plan  All goals met       Recommendations  Diet recommendations: Regular;Thin liquid Liquids provided via: Cup;Straw Medication Administration: Whole meds with liquid(consider with applesauce) Supervision: Patient able to self feed Compensations: Small  sips/bites;Slow rate(start intake with liquids) Postural Changes and/or Swallow Maneuvers: Out of bed for meals;Seated upright 90 degrees;Upright 30-60 min after meal                Oral Care Recommendations: Oral care BID Follow up Recommendations: None SLP Visit Diagnosis: Dysphagia, unspecified (R13.10) Plan: All goals met       GO                Macario Golds 02/16/2018, 12:20 PM   Luanna Salk, [facility] SLP [place] Pager [phone] Office [phone]

## 2018-02-16 NOTE — Discharge Summary (Addendum)
**Note De-Identified vi Obfusction** Physicin Dischrge Summry  Charles Randall. GUY:[id number] DOB: January 23, 1928 DOA: 02/12/2018  PCP: Nickol Mjor, MD  Admit dte: 02/12/2018 Dischrge dte: 02/16/2018  Time spent: 40 minutes  Recommendtions for Outptient Follow-up:  1. Follow up outptient CBC/CMP 2. Follow up with Dr. Alvn Dme 2 weeks post op 3. Follow up with PCP to discuss tretment for osteoporosis 4. Follow up repet chest x ry s outptient nd pleuritic chest discomfort 5. Follow speech recommendtions, pt with moderte esophgel dysmotility, but no stricture or mss.  Follow up with PCP, consider GI follow up.   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, without sttus epilepticus ([place])   Closed frcture of neck of left femur (HCC)   Moderte ortic regurgittion   CKD (chronic kidney disese), stge III (HCC)   Displced frcture of left femorl neck (HCC)   Left displced femorl neck frcture ([place])   Dischrge Condition: stble  Diet recommendtion: hert helthy  Filed Weights   02/12/18 2200  Weight: 61.1 kg    History of present illness:  7 yer old mle, lives lone, unstedy git nd mbultes with the help of  wlker, PMH of HTN, moderte ortic regurgittion, seizure disorder, stge III chronic kidney disese (bseline cretinine 1.5), HLD presented to ED fter mechnicl fll t home nd sustined left hip frcture. He returned from his friend's house nd ws reching out to his wlker when he lost blnce nd fell to the floor. He reportedly styed on the floor most of the night nd clled his girlfriend in the morning. Orthopedics ws consulted nd surgery plnned for tody.  He ws dmitted for  left hip frcture.  He is now s/p L hip hemirthroplsty on 12/17.  He c/o pleuritic CP nd hd CXR concerning for pneumoni.  He ws strted on bx.  He  ws seen by speech who recommended esophgrm which showed moderte esophgel dysmotility.  He ws stble on 12/20 nd dischrged to SNF with plns for outptient follow up with orthopedics nd PCP.  Hospitl Course:  1. Displced left hip femorl neck frcture: Sustined sttus post mechnicl fll t home. 1. S/p L hip hemirthroplsty on 12/17 by ortho 2. PT/OT -> recommending SNF 3. ASA BID per ortho for DVT ppx x 4 weeks 4. WBAT on L leg 5. Follow up with Dr. Alvn Dme in 2 weeks t [facility] 6. Pin control with norco, APAP prn 7. Dressing is wterproof nd my shower with it in plce 8. Dressing should remin in plce until f/u in clinic in 2 weeks   2. Pleuritic CP  Post Op Fever:  1. CXR with findings concerning for bibsilr pneumoni.  ? Aspirtion. 2. Blood cx, unsyn -> ugmentin  3. Elevted BNP, concern for CHF s well, but doesn't pper cliniclly overloded.  Echo in 09/2017 with grde 1 distolic dysfunction.  Will not repet t this time.  Continue to monitor closely. 4. Seen by speech, esophgrm with moderte dysmotility.  See recommendtions below.  Follow up with PCP nd consider GI follow up.  3. Acute blood loss nemi:Likely due to frcture. Lbs suggestive of iron def with AOCD. 1. Continue iron dily 2. Reltively stble, follow  4. Essentil hypertension:Controlled. Resume HCTZ-lisinopril.  5. HLD  6. Stge III chronic kidney disese:Bseline cretinine reportedly t 1.5. Resume HCTZ nd lisinopril. Continue to follow, cretinine below bseline.  7. Seizure disorder:Continue Keppr.  8. Moderte ortic **Note De-Identified vi Obfusction** regurgittion: Denies history of chest discomfort or dyspne or syncope or presyncope with or without ctivity.  9. B12 deficiency  Procedures: Left hip hemirthroplsty 12/20  Consulttions:  orthopedics  Dischrge Exm: Vitls:   02/15/18 2016 02/16/18 0526  BP: 130/80 125/63  Pulse: 85 63  Resp: 18 18  Temp: 99.8 F  (37.7 C) 98.3 F (36.8 C)  SpO2: 94% 98%   Feels better. Still some pleuritic CP on R. Notes some issues swllowing for  while now.  Generl: No cute distress. Crdiovsculr: Hert sounds show  regulr rte, nd rhythm.  Mild TTP to R chest. Lungs: Cler to usculttion bilterlly  Abdomen: Soft, nontender, nondistended Neurologicl: Alert nd oriented 3. Moves ll extremities 4. Crnil nerves II through XII grossly intct. Skin: Wrm nd dry. No rshes or lesions. Extremities: No clubbing or cynosis. No edem.  LLE with dressing in plce. Psychitric: Mood nd ffect re norml. Insight nd judgment re pproprite.   Dischrge Instructions   Dischrge Instructions    Cll MD for:  difficulty brething, hedche or visul disturbnces   Complete by:  As directed    Cll MD for:  extreme ftigue   Complete by:  As directed    Cll MD for:  hives   Complete by:  As directed    Cll MD for:  persistnt dizziness or light-hededness   Complete by:  As directed    Cll MD for:  persistnt nuse nd vomiting   Complete by:  As directed    Cll MD for:  redness, tenderness, or signs of infection (pin, swelling, redness, odor or green/yellow dischrge round incision site)   Complete by:  As directed    Cll MD for:  severe uncontrolled pin   Complete by:  As directed    Cll MD for:  temperture >100.4   Complete by:  As directed    Diet - low sodium hert helthy   Complete by:  As directed    Dischrge diet:   Complete by:  As directed    Diet recommendtions: Regulr;Thin liquid Liquids provided vi: Cup;Strw Mediction Administrtion: Whole meds with liquid(consider with pplesuce) Supervision: Ptient ble to self feed Compenstions: Smll sips/bites;Slow rte(strt intke with liquids) Posturl Chnges nd/or Swllow Mneuvers: Out of bed for mels;Seted upright 90 degrees;Upright 30-60 min fter mel            Orl Cre Recommendtions:  Orl cre BID Follow up Recommendtions: None SLP Visit Dignosis: Dysphgi, unspecified (R13.10) Pln: All gols met   Dischrge instructions   Complete by:  As directed    You were seen for  hip frcture.  This ws repired by orthopedics.  Plese follow up with your PCP to discuss tretment for osteoporosis fter you've been dischrged.  You hd evidence of pneumoni on chest x ry.  We've strted you on ntibiotics nd you'll complete this fter you're dischrged.  You should hve  repet chest x ry in  few weeks.  You hd n esophgrm which showed some dysmotility.  Plese follow speech therpy recommendtions.  Plese follow up with your PCP.  You could consider GI follow up s n outptient.   Return for new, recurrent, or worsening symptoms.  Plese sk your PCP to request records from this hospitliztion so they know wht ws done nd wht the next steps will be.   Increse ctivity slowly   Complete by:  As directed    Weight bering s tolerted   Complete by:  As directed  Laterality:  left   Extremity:  Lower     Allergies as of 02/16/2018   No Known Allergies     Medication List    STOP taking these medications   traMADol 50 MG tablet Commonly known as:  ULTRAM     TAKE these medications   amoxicillin-clavulanate 875-125 MG tablet Commonly known as:  AUGMENTIN Take 1 tablet by mouth every 12 (twelve) hours for 3 days.   aspirin 81 MG chewable tablet Commonly known as:  ASPIRIN CHILDRENS Chew 1 tablet (81 mg total) by mouth 2 (two) times daily. Take for 4 weeks, then resume regular dose.   ferrous sulfate 325 (65 FE) MG tablet Take 1 tablet (325 mg total) by mouth daily with breakfast. Start taking on:  February 17, 2018   HYDROcodone-acetaminophen 5-325 MG tablet Commonly known as:  NORCO Take 1-2 tablets by mouth every 4 (four) hours as needed for moderate pain.   levETIRAcetam 750 MG tablet Commonly known as:  KEPPRA Take 2 tablets  (1,500 mg total) by mouth 2 (two) times daily.   lisinopril-hydrochlorothiazide 20-25 MG tablet Commonly known as:  PRINZIDE,ZESTORETIC Take 1 tablet by mouth daily.   LUBRICANT EYE DROPS 0.4-0.3 % Soln Generic drug:  Polyethyl Glycol-Propyl Glycol Place 1 drop into both eyes 3 (three) times daily as needed (for dry/irritated eyes.).   Magnesium 250 MG Tabs Take 250 mg by mouth daily.   methocarbamol 500 MG tablet Commonly known as:  ROBAXIN Take 1 tablet (500 mg total) by mouth every 6 (six) hours as needed for muscle spasms.   vitamin B-12 1000 MCG tablet Commonly known as:  CYANOCOBALAMIN Take 1,000 mcg by mouth daily.   vitamin C 1000 MG tablet Take 1,000 mg by mouth daily.   Vitamin D-3 25 MCG (1000 UT) Caps Take 1,000 Units by mouth daily.   vitamin E 400 UNIT capsule Take 400 Units by mouth daily.            Discharge Care Instructions  (From admission, onward)         Start     Ordered   02/13/18 0000  Weight bearing as tolerated    Question Answer Comment  Laterality left   Extremity Lower      02/13/18 1604         No Known Allergies  Contact information for follow-up providers    Olin, Matthew, MD. Schedule an appointment as soon as possible for a visit in 2 weeks.   Specialty:  Orthopedic Surgery Contact information: [address] [place] [address] [phone]        Eksir, Samantha A, MD Follow up.   Specialty:  Family Medicine Contact information: [address] [place] [address] [phone]        Croitoru, Mihai, MD .   Specialty:  Cardiology Contact information: [address] [place] [address] [phone]            Contact information for after-discharge care    Destination    [facility] .   Service:  Skilled Nursing Contact information: [address] [place] [address] [phone]                   The results  of significant diagnostics from this hospitalization (including imaging, microbiology, ancillary and laboratory) are listed below for reference.    Significant Diagnostic Studies: Dg Chest 2 View  Result Date: 02/14/2018 CLINICAL DATA:  Status   post hip surgery yesterday. The patient is complaining of right-sided abdominal and chest discomfort. EXAM: CHEST - 2 VIEW COMPARISON:  Portable chest x-ray of February 12, 2018 FINDINGS: There is new increased density in the right lower lobe posteriorly. There is subtle increased density in the left retrocardiac region as well. The cardiac silhouette is mildly enlarged. The pulmonary vascularity is not clearly engorged. There is calcification in the wall of the aortic arch. The bony thorax exhibits no acute abnormality. IMPRESSION: Findings worrisome for bibasilar pneumonia possibly due to aspiration. There is a small right pleural effusion. Subtle increased prominence of the pulmonary interstitium and increased cardiac silhouette size may reflect low-grade CHF as well. Electronically Signed   By: David  Jordan M.D.   On: 02/14/2018 14:48   Dg Esophagus  Result Date: 02/16/2018 CLINICAL DATA:  Dysphagia EXAM: ESOPHOGRAM/BARIUM SWALLOW TECHNIQUE: Single contrast examination was performed using thin barium and barium tablet. FLUOROSCOPY TIME:  Fluoroscopy Time:  1 minutes 18 seconds Radiation Exposure Index (if provided by the fluoroscopic device): Number of Acquired Spot Images: 0 COMPARISON:  None. FINDINGS: Recent hip surgery with limited mobility. Moderate esophageal dysmotility. No stricture or mass. Pharyngeal swallowing normal. No aspiration or diverticulum. Small hiatal hernia Barium tablet did not pass into the stomach but made it to the distal esophagus. This is likely due to dysmotility and supine positioning. IMPRESSION: Moderate esophageal dysmotility. No stricture or mass. Small hiatal hernia. Barium tablet made to the distal esophagus but not did not  pass into the stomach likely due to dysmotility and supine position. Electronically Signed   By: Charles  Clark M.D.   On: 02/16/2018 10:11   Pelvis Portable  Result Date: 02/13/2018 CLINICAL DATA:  Left hip hemiarthroplasty. EXAM: PORTABLE PELVIS 1-2 VIEWS COMPARISON:  Left hip x-rays from yesterday. FINDINGS: The left hip demonstrates a hemiarthroplasty without evidence of hardware failure or complication. There is expected intra-articular air. There is no fracture or dislocation. The alignment is anatomic. Post-surgical changes noted in the surrounding soft tissues. IMPRESSION: Interval left hip hemiarthroplasty without evidence of acute postoperative complication. Electronically Signed   By: William T Derry M.D.   On: 02/13/2018 20:27   Dg Chest Port 1 View  Result Date: 02/12/2018 CLINICAL DATA:  Preop clearance EXAM: PORTABLE CHEST 1 VIEW COMPARISON:  CT 04/16/2015 FINDINGS: Tortuous atherosclerotic aorta. Normal heart size. Clear lungs. No acute osseous abnormality. IMPRESSION: 1. No active disease. 2. Mild aortic atherosclerosis and ectasia. Electronically Signed   By: David  Kwon M.D.   On: 02/12/2018 15:52   Dg Hip Unilat With Pelvis 2-3 Views Left  Result Date: 02/12/2018 CLINICAL DATA:  Patient fell last night striking the left side and was unable to get up and now with left hip pain. EXAM: DG HIP (WITH OR WITHOUT PELVIS) 2-3V LEFT COMPARISON:  None. FINDINGS: The patient has sustained an acute fracture of the base of the neck of the left femur. The femoral head remains normally related to the acetabulum. The intertrochanteric region appears intact as does the subtrochanteric region. IMPRESSION: Acute mildly angulated fracture through the neck of the left femur. No pelvic fracture is observed. Electronically Signed   By: David  Jordan M.D.   On: 02/12/2018 11:41    Microbiology: Recent Results (from the past 240 hour(s))  Surgical pcr screen     Status: None   Collection Time:  02/13/18  4:50 AM  Result Value Ref Range Status   MRSA, PCR NEGATIVE NEGATIVE Final   Staphylococcus aureus NEGATIVE NEGATIVE Final     **Note De-Identified vi Obfusction** Comment: (NOTE) The Xpert SA Assy (FDA pproved for NASAL specimens in ptients 56 yers of ge nd older), is one component of  comprehensive surveillnce progrm. It is not intended to dignose infection nor to guide or monitor tretment. Performed t [facility], [place] [address], [place], [place] [address]   Culture, blood (routine x 2)     Sttus: None (Preliminry result)   Collection Time: 02/14/18  6:16 PM  Result Vlue Ref Rnge Sttus   Specimen Description   Finl    BLOOD RIGHT ANTECUBITAL Performed t [facility] [address], [place], [place] [address]    Specil Requests   Finl    BOTTLES DRAWN AEROBIC AND ANAEROBIC Blood Culture dequte volume Performed t [facility] [address], [place], [place] [address]    Culture   Finl    NO GROWTH 2 DAYS Performed t [facility] [address], [place], [place] [address]    Report Sttus PENDING  Incomplete  Culture, blood (routine x 2)     Sttus: None (Preliminry result)   Collection Time: 02/14/18  6:24 PM  Result Vlue Ref Rnge Sttus   Specimen Description   Finl    BLOOD RIGHT WRIST Performed t [facility] [address] [place], [place] [address]    Specil Requests   Finl    BOTTLES DRAWN AEROBIC ONLY Blood Culture results my not be optiml due to n indequte volume of blood received in culture bottles   Culture PENDING  Incomplete   Report Sttus PENDING  Incomplete     Lbs: Bsic Metbolic Pnel: Recent Lbs  Lb 02/12/18 1051 02/13/18 0605 02/14/18 0631 02/15/18 0549 02/16/18 0538  NA 140 138 137 137 139  K 3.8 3.5 3.8 3.7 3.6  CL 104 106 103 107 106  CO2 _0 GLUCOSE 122* 96 150* 90 106*  BUN 39* 39* 37* 37* 32*  CREATININE 1.55* 1.27* 1.26*  1.17 1.11  CALCIUM 9.1 8.4* 7.9* 8.0* 7.9*  MG  --   --   --  2.0  --    Liver Function Tests: No results for input(s): AST, ALT, ALKPHOS, BILITOT, PROT, ALBUMIN in the lst 168 hours. No results for input(s): LIPASE, AMYLASE in the lst 168 hours. No results for input(s): AMMONIA in the lst 168 hours. CBC: Recent Lbs  Lb 02/12/18 1051 02/13/18 0605 02/14/18 0631 02/15/18 0549 02/16/18 0538  WBC 14.3* 10.7* 9.6 8.4 10.2  NEUTROABS 12.4*  --   --   --   --   HGB 12.1* 10.6* 10.2* 10.2* 9.9*  HCT 37.4* 33.4* 31.7* 31.6* 30.8*  MCV 91.7 93.8 94.1 92.7 95.1  PLT 216 165 157 179 187   Crdic Enzymes: Recent Lbs  Lb 02/12/18 1115  CKTOTAL 217   BNP: BNP (lst 3 results) Recent Lbs    02/14/18 1816  BNP 226.2*    ProBNP (lst 3 results) No results for input(s): PROBNP in the lst 8760 hours.  CBG: No results for input(s): GLUCAP in the lst 168 hours.     Signed:  Fyrene Helper MD.  [facility] 02/16/2018, 1:12 PM

## 2018-02-17 ENCOUNTER — Inpatient Hospital Stay (HOSPITAL_COMMUNITY): Payer: Medicare Other

## 2018-02-17 ENCOUNTER — Encounter (HOSPITAL_COMMUNITY): Payer: Self-pay

## 2018-02-17 DIAGNOSIS — I5031 Acute diastolic (congestive) heart failure: Secondary | ICD-10-CM

## 2018-02-17 LAB — CBC
HCT: 36.1 % — ABNORMAL LOW (ref 39.0–52.0)
Hemoglobin: 11.3 g/dL — ABNORMAL LOW (ref 13.0–17.0)
MCH: 29.6 pg (ref 26.0–34.0)
MCHC: 31.3 g/dL (ref 30.0–36.0)
MCV: 94.5 fL (ref 80.0–100.0)
PLATELETS: 266 10*3/uL (ref 150–400)
RBC: 3.82 MIL/uL — ABNORMAL LOW (ref 4.22–5.81)
RDW: 12.7 % (ref 11.5–15.5)
WBC: 11.6 10*3/uL — ABNORMAL HIGH (ref 4.0–10.5)
nRBC: 0 % (ref 0.0–0.2)

## 2018-02-17 LAB — BASIC METABOLIC PANEL
Anion gap: 11 (ref 5–15)
BUN: 31 mg/dL — ABNORMAL HIGH (ref 8–23)
CALCIUM: 8.4 mg/dL — AB (ref 8.9–10.3)
CO2: 24 mmol/L (ref 22–32)
Chloride: 104 mmol/L (ref 98–111)
Creatinine, Ser: 1.17 mg/dL (ref 0.61–1.24)
GFR calc Af Amer: >60 mL/min (ref >60)
GFR calc non Af Amer: 55 mL/min — ABNORMAL LOW (ref >60)
Glucose, Bld: 107 mg/dL — ABNORMAL HIGH (ref 70–99)
Potassium: 3.5 mmol/L (ref 3.5–5.1)
Sodium: 139 mmol/L (ref 135–145)

## 2018-02-17 MED ORDER — SODIUM CHLORIDE (PF) 0.9 % IJ SOLN
INTRAMUSCULAR | Status: AC
  Filled 2018-02-17: qty 50

## 2018-02-17 MED ORDER — IOPAMIDOL (ISOVUE-370) INJECTION 76%
INTRAVENOUS | Status: AC
  Filled 2018-02-17: qty 100

## 2018-02-17 MED ORDER — FUROSEMIDE 10 MG/ML IJ SOLN
20.0000 mg | Freq: Two times a day (BID) | INTRAMUSCULAR | Status: DC
  Administered 2018-02-18: 20 mg via INTRAVENOUS
  Filled 2018-02-17: qty 2

## 2018-02-17 MED ORDER — FUROSEMIDE 10 MG/ML IJ SOLN
20.0000 mg | Freq: Once | INTRAMUSCULAR | Status: AC
  Administered 2018-02-17: 20 mg via INTRAVENOUS
  Filled 2018-02-17: qty 2

## 2018-02-17 MED ORDER — IOPAMIDOL (ISOVUE-370) INJECTION 76%
100.0000 mL | Freq: Once | INTRAVENOUS | Status: AC | PRN
  Administered 2018-02-17: 100 mL via INTRAVENOUS

## 2018-02-17 NOTE — Progress Notes (Signed)
PROGRESS NOTE    [name]  JYN:[id number] DOB: 04-25-1927 DOA: 02/12/2018 PCP: Nickola Major, MD   Brief Narrative:  84 year old male, lives alone, unsteady gait and ambulates with the help of Kore Madlock walker, PMH of HTN, moderate aortic regurgitation, seizure disorder, stage III chronic kidney disease (baseline creatinine 1.5), HLD presented to ED after mechanical fall at home and sustained left hip fracture.  He returned from his friend's house and was reaching out to his walker when he lost balance and fell to the floor.  He reportedly stayed on the floor most of the night and called his girlfriend in the morning.  Orthopedics was consulted and surgery planned for today.  Planning for discharge on 12/20, but pt became hypoxic.  CTA with bilateral effusions with pulmonary edema.  Receiving lasix.  Follow.  Assessment & Plan:   Active Problems:   Benign essential hypertension   Seizure disorder (HCC)   Partial symptomatic epilepsy with complex partial seizures, not intractable, without status epilepticus ([place])   Closed fracture of neck of left femur (HCC)   Moderate aortic regurgitation   CKD (chronic kidney disease), stage III (HCC)   Displaced fracture of left femoral neck (HCC)   Left displaced femoral neck fracture ([place])  # Acute Hypoxic Respiratory Failure  HFpEF Exacerbation: pt on RA this AM of 12/20, but then desatted to the 80's.  Persistent this AM.  Being treated for pneumonia.  Given pleuritic CP, CTA obtained today with evidence of bilateral effusions and pulmonary edema.   Start lasix, wean O2 as tolearted I/O, daily weights Hopefully, can be weaned to room air soon   1. Displaced left hip femoral neck fracture: Sustained status post mechanical fall at home. 1. S/p L hip hemiarthroplasty on 12/17 by ortho 2. PT/OT -> recommending SNF 3. ASA BID per ortho for DVT ppx 4. WBAT 5. Follow up with Dr. Alvan Dame in 2 weeks 6. Pain control with norco, APAP  prn  2. Pleuritic CP  Post Op Fever:  1. CXR with findings concerning for bibasilar pneumonia.  ? Aspiration. 2. Blood cx, unasyn -> agumentin 3. Elevated BNP, CT scan as noted above with effusions and pulmonary edema, receiving lasix as well.  Echo with normal LV function, mild diastolic dysfunction.  4. Seen by speech, recommending further evaluation with esophagram (moderate dysmotility, see recommendations)  3. Acute blood loss anemia: Likely due to fracture.  Labs suggestive of iron def with AOCD. 1. Continue iron 2. Relatively stable, follow  4. Essential hypertension: Controlled.  Holding HCTZ-lisinopril for now.  PRN IV hydralazine.  5. HLD  6. Stage III chronic kidney disease: Baseline creatinine reportedly at 1.5.  Holding HCTZ and lisinopril.  Continue to follow, creatinine below baseline.  7. Seizure disorder: Continue Keppra.  8. Moderate aortic regurgitation: Denies history of chest discomfort or dyspnea or syncope or presyncope with or without activity.  9. B12 deficiency  10. Ascending Thoracic Aortic Aneurysm: needs semi annual imaging by CTA or MRA and CT surgery follow up   DVT prophylaxis: ASA BID Code Status: DNR Family Communication: daughter  Disposition Plan: pending further improvement, SNF placement   Consultants:   orthopedics  Procedures:  L hip hemiarthroplasty 12/17  Echo Study Conclusions  - Left ventricle: The cavity size was normal. Wall thickness was   normal. Systolic function was normal. The estimated ejection   fraction was in the range of 50% to 55%. Wall motion was normal;   there were no regional wall  motion abnormalities. Doppler   parameters are consistent with abnormal left ventricular   relaxation (grade 1 diastolic dysfunction). Doppler parameters   are consistent with high ventricular filling pressure. - Aortic valve: There was mild regurgitation. - Ascending aorta: The ascending aorta was mildly dilated. - Mitral  valve: Calcified annulus. - Pulmonary arteries: Systolic pressure was moderately increased.   PA peak pressure: 54 mm Hg (S).  Impressions:  - Normal LV function; mild diastolic dysfunction; sclerotic aortic   valve with mild AI; mildly dilated ascending aorta; mild TR with   moderate pulmonary hypertension.  Antimicrobials:  Anti-infectives (From admission, onward)   Start     Dose/Rate Route Frequency Ordered Stop   02/16/18 1045  amoxicillin-clavulanate (AUGMENTIN) 875-125 MG per tablet 1 tablet     1 tablet Oral Every 12 hours 02/16/18 1040 02/19/18 2159   02/16/18 0000  amoxicillin-clavulanate (AUGMENTIN) 875-125 MG tablet     1 tablet Oral Every 12 hours 02/16/18 1252 02/19/18 2359   02/14/18 1800  Ampicillin-Sulbactam (UNASYN) 3 g in sodium chloride 0.9 % 100 mL IVPB  Status:  Discontinued     3 g 200 mL/hr over 30 Minutes Intravenous Every 8 hours 02/14/18 1756 02/16/18 1040   02/14/18 0600  ceFAZolin (ANCEF) IVPB 2g/100 mL premix     2 g 200 mL/hr over 30 Minutes Intravenous On call to O.R. 02/13/18 1423 02/13/18 1857   02/14/18 0030  ceFAZolin (ANCEF) IVPB 2g/100 mL premix     2 g 200 mL/hr over 30 Minutes Intravenous Every 6 hours 02/13/18 2120 02/14/18 0631     Subjective: Continues to have pleuritic CP, maybe Emannuel Vise little better.  Objective: Vitals:   02/17/18 0955 02/17/18 0957 02/17/18 1325 02/17/18 1852  BP:   139/71   Pulse:   72 (!) 102  Resp:   18   Temp:   98 F (36.7 C)   TempSrc:   Oral   SpO2: (!) 86% 94% 98% 97%  Weight:      Height:        Intake/Output Summary (Last 24 hours) at 02/17/2018 1927 Last data filed at 02/17/2018 1900 Gross per 24 hour  Intake 120 ml  Output 850 ml  Net -730 ml   Filed Weights   02/12/18 2200  Weight: 61.1 kg    Examination:  General: No acute distress. Cardiovascular: Heart sounds show Mikylah Ackroyd regular rate, and rhythm. Lungs: Clear to auscultation bilaterally  Abdomen: Soft, nontender,  nondistended Neurological: Alert and oriented 3. Moves all extremities 4 w. Cranial nerves II through XII grossly intact. Skin: Warm and dry. No rashes or lesions. Extremities: No clubbing or cyanosis. No edema.  Psychiatric: Mood and affect are normal. Insight and judgment are appropriate.   Data Reviewed: I have personally reviewed following labs and imaging studies  CBC: Recent Labs  Lab 02/12/18 1051 02/13/18 0605 02/14/18 0631 02/15/18 0549 02/16/18 0538 02/17/18 0903  WBC 14.3* 10.7* 9.6 8.4 10.2 11.6*  NEUTROABS 12.4*  --   --   --   --   --   HGB 12.1* 10.6* 10.2* 10.2* 9.9* 11.3*  HCT 37.4* 33.4* 31.7* 31.6* 30.8* 36.1*  MCV 91.7 93.8 94.1 92.7 95.1 94.5  PLT 216 165 157 179 187 262   Basic Metabolic Panel: Recent Labs  Lab 02/13/18 0605 02/14/18 0631 02/15/18 0549 02/16/18 0538 02/17/18 0903  NA 138 137 137 139 139  K 3.5 3.8 3.7 3.6 3.5  CL 106 103 107 106 104  CO2 24  22 23 22 24   GLUCOSE 96 150* 90 106* 107*  BUN 39* 37* 37* 32* 31*  CREATININE 1.27* 1.26* 1.17 1.11 1.17  CALCIUM 8.4* 7.9* 8.0* 7.9* 8.4*  MG  --   --  2.0  --   --    GFR: Estimated Creatinine Clearance: 37.7 mL/min (by C-G formula based on SCr of 1.17 mg/dL). Liver Function Tests: No results for input(s): AST, ALT, ALKPHOS, BILITOT, PROT, ALBUMIN in the last 168 hours. No results for input(s): LIPASE, AMYLASE in the last 168 hours. No results for input(s): AMMONIA in the last 168 hours. Coagulation Profile: Recent Labs  Lab 02/12/18 1051  INR 1.12   Cardiac Enzymes: Recent Labs  Lab 02/12/18 1115  CKTOTAL 217   BNP (last 3 results) No results for input(s): PROBNP in the last 8760 hours. HbA1C: No results for input(s): HGBA1C in the last 72 hours. CBG: No results for input(s): GLUCAP in the last 168 hours. Lipid Profile: No results for input(s): CHOL, HDL, LDLCALC, TRIG, CHOLHDL, LDLDIRECT in the last 72 hours. Thyroid Function Tests: No results for input(s): TSH,  T4TOTAL, FREET4, T3FREE, THYROIDAB in the last 72 hours. Anemia Panel: Recent Labs    02/15/18 0549  VITAMINB12 587  FOLATE 12.9  FERRITIN 430*  TIBC 149*  IRON 21*   Sepsis Labs: No results for input(s): PROCALCITON, LATICACIDVEN in the last 168 hours.  Recent Results (from the past 240 hour(s))  Surgical pcr screen     Status: None   Collection Time: 02/13/18  4:50 AM  Result Value Ref Range Status   MRSA, PCR NEGATIVE NEGATIVE Final   Staphylococcus aureus NEGATIVE NEGATIVE Final    Comment: (NOTE) The Xpert SA Assay (FDA approved for NASAL specimens in patients 67 years of age and older), is one component of Tramell Piechota comprehensive surveillance program. It is not intended to diagnose infection nor to guide or monitor treatment. Performed at [facility], [place] [address] [place], [place] [address]   Culture, blood (routine x 2)     Status: None (Preliminary result)   Collection Time: 02/14/18  6:16 PM  Result Value Ref Range Status   Specimen Description   Final    BLOOD RIGHT ANTECUBITAL Performed at [place] [address], [place], [place] [address]    Special Requests   Final    BOTTLES DRAWN AEROBIC AND ANAEROBIC Blood Culture adequate volume Performed at [facility] [address], [place], [place] [address]    Culture   Final    NO GROWTH 3 DAYS Performed at [facility], [place] [address], [place], [place] [address]    Report Status PENDING  Incomplete  Culture, blood (routine x 2)     Status: None (Preliminary result)   Collection Time: 02/14/18  6:24 PM  Result Value Ref Range Status   Specimen Description   Final    BLOOD RIGHT WRIST Performed at [place] [address] [place], [place] [address]    Special Requests   Final    BOTTLES DRAWN AEROBIC ONLY Blood Culture results may not be optimal due to an inadequate volume of blood received in culture bottles   Culture    Final    NO GROWTH 3 DAYS Performed at [facility], [place] [address], [place], [place] [address]    Report Status PENDING  Incomplete         Radiology Studies: Ct Angio Chest Pe  W Or Wo Contrast  Result Date: 02/17/2018 CLINICAL DATA:  Shortness of breath, pleuritic chest pain, postoperative fever, bibasilar infiltrate question pneumonia or aspiration, elevated BNP question CHF EXAM: CT ANGIOGRAPHY CHEST WITH CONTRAST TECHNIQUE: Multidetector CT imaging of the chest was performed using the standard protocol during bolus administration of intravenous contrast. Multiplanar CT image reconstructions and MIPs were obtained to evaluate the vascular anatomy. CONTRAST:  160mL ISOVUE-370 IOPAMIDOL (ISOVUE-370) INJECTION 76% IV COMPARISON:  04/16/2015 FINDINGS: Cardiovascular: Mild atherosclerotic calcifications aorta and coronary arteries. Aneurysmal dilatation of the ascending thoracic aorta 4.7 cm diameter. Minimal pericardial fluid. Pulmonary arteries adequately opacified and patent. No evidence of pulmonary embolism. Mediastinum/Nodes: Esophagus unremarkable. No thoracic adenopathy. Base of cervical region normal appearance. Lungs/Pleura: Moderate-sized pleural effusions bilaterally. Significant compressive atelectasis of the lower lobes and posterior upper lungs. Minimal infiltrates in the upper lobes bilaterally question mild pulmonary edema. No pneumothorax or definite pulmonary mass. Upper Abdomen: Beam hardening artifacts from dense retained contrast in colon. Visualized upper abdomen otherwise unremarkable. Musculoskeletal: Bones demineralized with multiple thoracic compression fractures, unchanged. Review of the MIP images confirms the above findings. IMPRESSION: No evidence pulmonary embolism. Moderate-sized pleural effusions bilaterally with significant BILATERAL compressive atelectasis of the lungs. Mild infiltrates in upper lobes favor pulmonary edema. Aneurysmal dilatation ascending  thoracic aorta, recommendation below: Ascending thoracic aortic aneurysm: Recommend semi-annual imaging followup by CTA or MRA and referral to cardiothoracic surgery if not already obtained. This recommendation follows 2010 ACCF/AHA/AATS/ACR/ASA/SCA/SCAI/SIR/STS/SVM Guidelines for the Diagnosis and Management of Patients With Thoracic Aortic Disease. Circulation. 2010; 121: L275-T700. Aortic Atherosclerosis (ICD10-I70.0). Aortic aneurysm NOS (ICD10-I71.9). Electronically Signed   By: Lavonia Dana M.D.   On: 02/17/2018 12:58   Dg Esophagus  Result Date: 02/16/2018 CLINICAL DATA:  Dysphagia EXAM: ESOPHOGRAM/BARIUM SWALLOW TECHNIQUE: Single contrast examination was performed using thin barium and barium tablet. FLUOROSCOPY TIME:  Fluoroscopy Time:  1 minutes 18 seconds Radiation Exposure Index (if provided by the fluoroscopic device): Number of Acquired Spot Images: 0 COMPARISON:  None. FINDINGS: Recent hip surgery with limited mobility. Moderate esophageal dysmotility. No stricture or mass. Pharyngeal swallowing normal. No aspiration or diverticulum. Small hiatal hernia Barium tablet did not pass into the stomach but made it to the distal esophagus. This is likely due to dysmotility and supine positioning. IMPRESSION: Moderate esophageal dysmotility. No stricture or mass. Small hiatal hernia. Barium tablet made to the distal esophagus but not did not pass into the stomach likely due to dysmotility and supine position. Electronically Signed   By: Franchot Gallo M.D.   On: 02/16/2018 10:11        Scheduled Meds: . amoxicillin-clavulanate  1 tablet Oral Q12H  . aspirin EC  325 mg Oral BID  . cholecalciferol  1,000 Units Oral Daily  . docusate sodium  100 mg Oral BID  . ferrous sulfate  325 mg Oral Q breakfast  . iopamidol      . levETIRAcetam  1,500 mg Oral BID  . magnesium oxide  200 mg Oral Daily  . sodium chloride (PF)      . vitamin B-12  1,000 mcg Oral Daily  . vitamin C  1,000 mg Oral Daily   . vitamin E  400 Units Oral Daily   Continuous Infusions: . methocarbamol (ROBAXIN) IV       LOS: 5 days    Time spent: over 30 min    Fayrene Helper, MD [facility] Pager [phone]  If 7PM-7AM, please contact night-coverage [email or web address] Password [facility] 02/17/2018, 7:27 PM

## 2018-02-18 DIAGNOSIS — S72002G Fracture of unspecified part of neck of left femur, subsequent encounter for closed fracture with delayed healing: Secondary | ICD-10-CM

## 2018-02-18 LAB — CBC
HCT: 33.1 % — ABNORMAL LOW (ref 39.0–52.0)
Hemoglobin: 10.5 g/dL — ABNORMAL LOW (ref 13.0–17.0)
MCH: 29.3 pg (ref 26.0–34.0)
MCHC: 31.7 g/dL (ref 30.0–36.0)
MCV: 92.5 fL (ref 80.0–100.0)
NRBC: 0 % (ref 0.0–0.2)
Platelets: 295 10*3/uL (ref 150–400)
RBC: 3.58 MIL/uL — ABNORMAL LOW (ref 4.22–5.81)
RDW: 12.5 % (ref 11.5–15.5)
WBC: 12.5 10*3/uL — ABNORMAL HIGH (ref 4.0–10.5)

## 2018-02-18 LAB — COMPREHENSIVE METABOLIC PANEL
ALT: 12 U/L (ref 0–44)
AST: 18 U/L (ref 15–41)
Albumin: 2.4 g/dL — ABNORMAL LOW (ref 3.5–5.0)
Alkaline Phosphatase: 81 U/L (ref 38–126)
Anion gap: 10 (ref 5–15)
BUN: 30 mg/dL — ABNORMAL HIGH (ref 8–23)
CO2: 25 mmol/L (ref 22–32)
Calcium: 8.1 mg/dL — ABNORMAL LOW (ref 8.9–10.3)
Chloride: 102 mmol/L (ref 98–111)
Creatinine, Ser: 1.33 mg/dL — ABNORMAL HIGH (ref 0.61–1.24)
GFR calc Af Amer: 55 mL/min — ABNORMAL LOW (ref >60)
GFR calc non Af Amer: 47 mL/min — ABNORMAL LOW (ref >60)
GLUCOSE: 95 mg/dL (ref 70–99)
Potassium: 3.5 mmol/L (ref 3.5–5.1)
SODIUM: 137 mmol/L (ref 135–145)
Total Bilirubin: 1.1 mg/dL (ref 0.3–1.2)
Total Protein: 5.3 g/dL — ABNORMAL LOW (ref 6.5–8.1)

## 2018-02-18 LAB — MAGNESIUM: Magnesium: 2 mg/dL (ref 1.7–2.4)

## 2018-02-18 MED ORDER — FUROSEMIDE 10 MG/ML IJ SOLN
40.0000 mg | Freq: Three times a day (TID) | INTRAMUSCULAR | Status: AC
  Administered 2018-02-18 – 2018-02-19 (×3): 40 mg via INTRAVENOUS
  Filled 2018-02-18 (×3): qty 4

## 2018-02-18 NOTE — Progress Notes (Signed)
PROGRESS NOTE    Charles Randall.  VPX:[id number] DOB: [birth year] DOA: 02/12/2018 PCP: Nickola Major, MD   Brief Narrative:  84 year old with history of seizure, CKD stage III, moderate aortic regurgitation, diastolic CHF, seizure disorder, essential hypertension was brought to the hospital after sustaining a mechanical fall causing left hip fracture.  He underwent left hip arthroplasty on 12/17 and doing well.  Later in his hospital stay he developed hypoxia and a CT showed effusion and pulmonary edema therefore started receiving IV Lasix.  Echocardiogram was normal LV with mild diastolic dysfunction.   Assessment & Plan:   Active Problems:   Benign essential hypertension   Seizure disorder (HCC)   Partial symptomatic epilepsy with complex partial seizures, not intractable, without status epilepticus ([place])   Closed fracture of neck of left femur (HCC)   Moderate aortic regurgitation   CKD (chronic kidney disease), stage III ([place])   Displaced fracture of left femoral neck (HCC)   Left displaced femoral neck fracture (HCC)   Acute diastolic CHF (congestive heart failure) (HCC)  Acute hypoxic respiratory distress Acute diastolic CHF exacerbation -This is slowly improving with IV diuresis. -We will increase Lasix to 80 mg every 8 hours X3 doses - Fluid restriction 1800 cc, supportive care, strict input and output - Echocardiogram shows ejection fraction 46-27%, grade 1 diastolic dysfunction.  Mild regurgitation.  Moderately increased pulmonary arterial pressures.  Left femoral neck fracture status post left hip hemiarthroplasty 12/17 -We will need aspirin twice daily for DVT prophylaxis.  Follow-up outpatient with Dr. Alvan Dame in 2 weeks -Pain control, bowel regimen PRN  Acute blood loss anemia -Some lab suggestive of iron deficiency with anemia of chronic disease -Iron supplements, bowel regimen PRN  Essential hypertension -Well-controlled.  Currently getting IV Lasix for  aggressive diuresis.  Hydrochlorothiazide and lisinopril on hold  CKD stage III -Creatinine appears to be stable around 1.5  Seizure disorder -Continue Keppra  Vitamin B12 deficiency Dose of fluids  Ascending thoracic aortic aneurysm -Needs outpatient follow-up with CTA/MRA semiannually.  DVT prophylaxis: Aspirin twice daily Code Status: DNR Family Communication: Spoke with the patient's family Disposition Plan: Pending improvement in the respiratory status.  Hopefully we can diurese with IV Lasix today and plans for discharge/placement tomorrow.  Consultants:   Orthopedic  Procedures:   Left hip arthroplasties 12/17  Antimicrobials:   Perioperative   Subjective: Still has some exertional shortness of breath when he walks to the bathroom but better at rest.  Review of Systems Otherwise negative except as per HPI, including: General: Denies fever, chills, night sweats or unintended weight loss. Resp: Denies cough, wheezing, shortness of breath. Cardiac: Denies chest pain, palpitations, orthopnea, paroxysmal nocturnal dyspnea. GI: Denies abdominal pain, nausea, vomiting, diarrhea or constipation GU: Denies dysuria, frequency, hesitancy or incontinence MS: Denies muscle aches, joint pain or swelling Neuro: Denies headache, neurologic deficits (focal weakness, numbness, tingling), abnormal gait Psych: Denies anxiety, depression, SI/HI/AVH Skin: Denies new rashes or lesions ID: Denies sick contacts, exotic exposures, travel  Objective: Vitals:   02/17/18 1852 02/17/18 2050 02/18/18 0537 02/18/18 1431  BP:  130/71 103/69 102/64  Pulse: (!) 102 69 66 77  Resp:  20 18 (!) 24  Temp:  98.6 F (37 C) 98 F (36.7 C) 98 F (36.7 C)  TempSrc:  Oral Oral Oral  SpO2: 97% 92% 94% 93%  Weight:   65.9 kg   Height:        Intake/Output Summary (Last 24 hours) at 02/18/2018 1524 Last data  filed at 02/18/2018 1400 Gross per 24 hour  Intake 600 ml  Output 1100 ml  Net  -500 ml   Filed Weights   02/12/18 2200 02/18/18 0537  Weight: 61.1 kg 65.9 kg    Examination:  General exam: Appears calm and comfortable, elderly frail-appearing Respiratory system: Diminished breath sounds at the bases Cardiovascular system: S1 & S2 heard, RRR. No JVD, murmurs, rubs, gallops or clicks. No pedal edema. Gastrointestinal system: Abdomen is nondistended, soft and nontender. No organomegaly or masses felt. Normal bowel sounds heard. Central nervous system: Alert and oriented. No focal neurological deficits. Extremities: Symmetric 4+ x 5 power. Skin: No rashes, lesions or ulcers Psychiatry: Judgement and insight appear normal. Mood & affect appropriate.     Data Reviewed:   CBC: Recent Labs  Lab 02/12/18 1051  02/14/18 0631 02/15/18 0549 02/16/18 0538 02/17/18 0903 02/18/18 0523  WBC 14.3*   < > 9.6 8.4 10.2 11.6* 12.5*  NEUTROABS 12.4*  --   --   --   --   --   --   HGB 12.1*   < > 10.2* 10.2* 9.9* 11.3* 10.5*  HCT 37.4*   < > 31.7* 31.6* 30.8* 36.1* 33.1*  MCV 91.7   < > 94.1 92.7 95.1 94.5 92.5  PLT 216   < > 157 179 187 266 295   < > = values in this interval not displayed.   Basic Metabolic Panel: Recent Labs  Lab 02/14/18 0631 02/15/18 0549 02/16/18 0538 02/17/18 0903 02/18/18 0523  NA 137 137 139 139 137  K 3.8 3.7 3.6 3.5 3.5  CL 103 107 106 104 102  CO2 22 23 22 24 25   GLUCOSE 150* 90 106* 107* 95  BUN 37* 37* 32* 31* 30*  CREATININE 1.26* 1.17 1.11 1.17 1.33*  CALCIUM 7.9* 8.0* 7.9* 8.4* 8.1*  MG  --  2.0  --   --  2.0   GFR: Estimated Creatinine Clearance: 35.8 mL/min (A) (by C-G formula based on SCr of 1.33 mg/dL (H)). Liver Function Tests: Recent Labs  Lab 02/18/18 0523  AST 18  ALT 12  ALKPHOS 81  BILITOT 1.1  PROT 5.3*  ALBUMIN 2.4*   No results for input(s): LIPASE, AMYLASE in the last 168 hours. No results for input(s): AMMONIA in the last 168 hours. Coagulation Profile: Recent Labs  Lab 02/12/18 1051  INR 1.12    Cardiac Enzymes: Recent Labs  Lab 02/12/18 1115  CKTOTAL 217   BNP (last 3 results) No results for input(s): PROBNP in the last 8760 hours. HbA1C: No results for input(s): HGBA1C in the last 72 hours. CBG: No results for input(s): GLUCAP in the last 168 hours. Lipid Profile: No results for input(s): CHOL, HDL, LDLCALC, TRIG, CHOLHDL, LDLDIRECT in the last 72 hours. Thyroid Function Tests: No results for input(s): TSH, T4TOTAL, FREET4, T3FREE, THYROIDAB in the last 72 hours. Anemia Panel: No results for input(s): VITAMINB12, FOLATE, FERRITIN, TIBC, IRON, RETICCTPCT in the last 72 hours. Sepsis Labs: No results for input(s): PROCALCITON, LATICACIDVEN in the last 168 hours.  Recent Results (from the past 240 hour(s))  Surgical pcr screen     Status: None   Collection Time: 02/13/18  4:50 AM  Result Value Ref Range Status   MRSA, PCR NEGATIVE NEGATIVE Final   Staphylococcus aureus NEGATIVE NEGATIVE Final    Comment: (NOTE) The Xpert SA Assay (FDA approved for NASAL specimens in patients 3 years of age and older), is one component of a comprehensive  surveillance program. It is not intended to diagnose infection nor to guide or monitor treatment. Performed at [facility], [place] [address], [place], [place] [address]   Culture, blood (routine x 2)     Status: None (Preliminary result)   Collection Time: 02/14/18  6:16 PM  Result Value Ref Range Status   Specimen Description   Final    BLOOD RIGHT ANTECUBITAL Performed at [facility] [address], [place], [place] [address]    Special Requests   Final    BOTTLES DRAWN AEROBIC AND ANAEROBIC Blood Culture adequate volume Performed at [facility] [address] [place], [place] [address]    Culture   Final    NO GROWTH 4 DAYS Performed at [facility], [place] [address] [place], [place] [address]    Report Status PENDING  Incomplete  Culture, blood  (routine x 2)     Status: None (Preliminary result)   Collection Time: 02/14/18  6:24 PM  Result Value Ref Range Status   Specimen Description   Final    BLOOD RIGHT WRIST Performed at [facility] [address], [place], [place] [address]    Special Requests   Final    BOTTLES DRAWN AEROBIC ONLY Blood Culture results may not be optimal due to an inadequate volume of blood received in culture bottles   Culture   Final    NO GROWTH 4 DAYS Performed at [facility], [place] [address] [place], [place] [address]    Report Status PENDING  Incomplete         Radiology Studies: Ct Angio Chest Pe W Or Wo Contrast  Result Date: 02/17/2018 CLINICAL DATA:  Shortness of breath, pleuritic chest pain, postoperative fever, bibasilar infiltrate question pneumonia or aspiration, elevated BNP question CHF EXAM: CT ANGIOGRAPHY CHEST WITH CONTRAST TECHNIQUE: Multidetector CT imaging of the chest was performed using the standard protocol during bolus administration of intravenous contrast. Multiplanar CT image reconstructions and MIPs were obtained to evaluate the vascular anatomy. CONTRAST:  172mL ISOVUE-370 IOPAMIDOL (ISOVUE-370) INJECTION 76% IV COMPARISON:  04/16/2015 FINDINGS: Cardiovascular: Mild atherosclerotic calcifications aorta and coronary arteries. Aneurysmal dilatation of the ascending thoracic aorta 4.7 cm diameter. Minimal pericardial fluid. Pulmonary arteries adequately opacified and patent. No evidence of pulmonary embolism. Mediastinum/Nodes: Esophagus unremarkable. No thoracic adenopathy. Base of cervical region normal appearance. Lungs/Pleura: Moderate-sized pleural effusions bilaterally. Significant compressive atelectasis of the lower lobes and posterior upper lungs. Minimal infiltrates in the upper lobes bilaterally question mild pulmonary edema. No pneumothorax or definite pulmonary mass. Upper Abdomen: Beam hardening artifacts from dense retained contrast in  colon. Visualized upper abdomen otherwise unremarkable. Musculoskeletal: Bones demineralized with multiple thoracic compression fractures, unchanged. Review of the MIP images confirms the above findings. IMPRESSION: No evidence pulmonary embolism. Moderate-sized pleural effusions bilaterally with significant BILATERAL compressive atelectasis of the lungs. Mild infiltrates in upper lobes favor pulmonary edema. Aneurysmal dilatation ascending thoracic aorta, recommendation below: Ascending thoracic aortic aneurysm: Recommend semi-annual imaging followup by CTA or MRA and referral to cardiothoracic surgery if not already obtained. This recommendation follows 2010 ACCF/AHA/AATS/ACR/ASA/SCA/SCAI/SIR/STS/SVM Guidelines for the Diagnosis and Management of Patients With Thoracic Aortic Disease. Circulation. 2010; 121: B762-G315. Aortic Atherosclerosis (ICD10-I70.0). Aortic aneurysm NOS (ICD10-I71.9). Electronically Signed   By: Lavonia Dana M.D.   On: 02/17/2018 12:58        Scheduled Meds: . amoxicillin-clavulanate  1 tablet Oral Q12H  . aspirin EC  325 mg Oral BID  .  cholecalciferol  1,000 Units Oral Daily  . docusate sodium  100 mg Oral BID  . ferrous sulfate  325 mg Oral Q breakfast  . furosemide  20 mg Intravenous BID  . levETIRAcetam  1,500 mg Oral BID  . magnesium oxide  200 mg Oral Daily  . vitamin B-12  1,000 mcg Oral Daily  . vitamin C  1,000 mg Oral Daily  . vitamin E  400 Units Oral Daily   Continuous Infusions: . methocarbamol (ROBAXIN) IV       LOS: 6 days   Time spent= 35 mins     Charles Loader, MD [facility] Pager [phone]   If 7PM-7AM, please contact night-coverage [email or web address] Password [facility] 02/18/2018, 3:24 PM

## 2018-02-19 LAB — BASIC METABOLIC PANEL
Anion gap: 9 (ref 5–15)
BUN: 31 mg/dL — ABNORMAL HIGH (ref 8–23)
CHLORIDE: 96 mmol/L — AB (ref 98–111)
CO2: 29 mmol/L (ref 22–32)
Calcium: 7.8 mg/dL — ABNORMAL LOW (ref 8.9–10.3)
Creatinine, Ser: 1.36 mg/dL — ABNORMAL HIGH (ref 0.61–1.24)
GFR calc Af Amer: 53 mL/min — ABNORMAL LOW (ref >60)
GFR calc non Af Amer: 46 mL/min — ABNORMAL LOW (ref >60)
Glucose, Bld: 147 mg/dL — ABNORMAL HIGH (ref 70–99)
Potassium: 3.3 mmol/L — ABNORMAL LOW (ref 3.5–5.1)
SODIUM: 134 mmol/L — AB (ref 135–145)

## 2018-02-19 LAB — CULTURE, BLOOD (ROUTINE X 2)
Culture: NO GROWTH
Culture: NO GROWTH
Special Requests: ADEQUATE

## 2018-02-19 LAB — MAGNESIUM: Magnesium: 1.9 mg/dL (ref 1.7–2.4)

## 2018-02-19 LAB — CREATININE, SERUM
Creatinine, Ser: 1.33 mg/dL — ABNORMAL HIGH (ref 0.61–1.24)
GFR calc Af Amer: 55 mL/min — ABNORMAL LOW (ref >60)
GFR calc non Af Amer: 47 mL/min — ABNORMAL LOW (ref >60)

## 2018-02-19 LAB — BRAIN NATRIURETIC PEPTIDE: B Natriuretic Peptide: 237.4 pg/mL — ABNORMAL HIGH (ref 0.0–100.0)

## 2018-02-19 MED ORDER — HYDROCODONE-ACETAMINOPHEN 5-325 MG PO TABS: 1.0000 | ORAL_TABLET | Freq: Four times a day (QID) | ORAL | 0 refills | Status: AC | PRN

## 2018-02-19 MED ORDER — ASPIRIN 325 MG PO TBEC: 325.0000 mg | DELAYED_RELEASE_TABLET | Freq: Two times a day (BID) | ORAL | 0 refills | Status: DC

## 2018-02-19 NOTE — Care Management Important Message (Signed)
Important Message  Patient Details  Name: Charles Randall. MRN: [id number] Date of Birth: [birth year]   Medicare Important Message Given:  Yes    [name] 02/19/2018, 11:09 AMImportant Message  Patient Details  Name: Charles Randall. MRN: [id number] Date of Birth: 29-Oct-1927   Medicare Important Message Given:  Yes    [name] 02/19/2018, 11:09 AM

## 2018-02-19 NOTE — Progress Notes (Signed)
Physical Therapy Treatment Patient Details Name: Charles Randall. MRN: [id number] DOB: [birth year] Today's Date: 02/19/2018    History of Present Illness Charles Randall  is a 84 y.o. male, with history of hypertension, moderate aortic regurgitation, seizure disorder, chronic kidney disease stage III (baseline creatinine around 1.5) who was brought to the ED after sustaining a fall.  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  Now s/p L hip hemiarthroplasty.  Pt developed pulmonary edema during hospitalization.    PT Comments    Pt is progressing with mobility, he ambulated 22' with RW, distance limited by dyspnea. SaO2 82% on room air while walking, 97% on room air at rest.   Follow Up Recommendations  SNF;Supervision/Assistance - 24 hour     Equipment Recommendations  None recommended by PT    Recommendations for Other Services       Precautions / Restrictions Precautions Precautions: Fall;Posterior Hip Precaution Booklet Issued: Yes (comment) Precaution Comments: reviewed THP with pt (pt able to recall 1 of 3 precautions), Hx Polio with shortened L leg length  Restrictions Weight Bearing Restrictions: No LLE Weight Bearing: Weight bearing as tolerated    Mobility  Bed Mobility Overal bed mobility: Needs Assistance Bed Mobility: Supine to Sit     Supine to sit: Min assist     General bed mobility comments: min A to support LLE and to pivot hips to edge of bed  Transfers Overall transfer level: Needs assistance Equipment used: Rolling walker (2 wheeled)(EVA walker ) Transfers: Sit to/from Stand Sit to Stand: Min assist;From elevated surface         General transfer comment: from elevated bed, min A to rise  Ambulation/Gait Ambulation/Gait assistance: Min guard Gait Distance (Feet): 40 Feet Assistive device: Rolling walker (2 wheeled)(EVA walker ) Gait Pattern/deviations: Step-to pattern;Decreased stance time - left;Decreased step length -  right Gait velocity: decreased    General Gait Details: SaO2 82% on RA walking so assisted pt to recliner and applied 2L O2 [place], RN notified, SaO2 97% on RA at rest prior to activity   Stairs             Wheelchair Mobility    Modified Rankin (Stroke Patients Only)       Balance Overall balance assessment: Needs assistance Sitting-balance support: Bilateral upper extremity supported Sitting balance-Leahy Scale: Fair     Standing balance support: Bilateral upper extremity supported Standing balance-Leahy Scale: Poor                              Cognition Arousal/Alertness: Awake/alert Behavior During Therapy: WFL for tasks assessed/performed Overall Cognitive Status: Within Functional Limits for tasks assessed                                 General Comments: AxO x 4 and very motivated/Independant       Exercises General Exercises - Lower Extremity Ankle Circles/Pumps: AROM;10 reps;Supine;Both Heel Slides: AROM;Right;Left;Supine;10 reps Hip ABduction/ADduction: AROM;Left;10 reps;Supine    General Comments        Pertinent Vitals/Pain Pain Score: 2  Pain Location: L hip Pain Descriptors / Indicators: Sore Pain Intervention(s): Limited activity within patient's tolerance;Monitored during session;Repositioned    Home Living                      Prior Function  PT Goals (current goals can now be found in the care plan section) Acute Rehab PT Goals Patient Stated Goal: agreeable to rehab, but wanted to go home PT Goal Formulation: With patient/family Time For Goal Achievement: 02/28/18 Potential to Achieve Goals: Fair Progress towards PT goals: Progressing toward goals    Frequency    Min 3X/week      PT Plan Current plan remains appropriate    Co-evaluation              AM-PAC PT "6 Clicks" Mobility   Outcome Measure  Help needed turning from your back to your side while in a flat bed  without using bedrails?: A Little Help needed moving from lying on your back to sitting on the side of a flat bed without using bedrails?: A Little Help needed moving to and from a bed to a chair (including a wheelchair)?: A Little Help needed standing up from a chair using your arms (e.g., wheelchair or bedside chair)?: A Little Help needed to walk in hospital room?: A Little Help needed climbing 3-5 steps with a railing? : A Lot 6 Click Score: 17    End of Session Equipment Utilized During Treatment: Gait belt Activity Tolerance: Patient tolerated treatment well Patient left: in chair;with call bell/phone within reach;with family/visitor present Nurse Communication: Mobility status;Other (comment)(desaturation with ambulation) PT Visit Diagnosis: Other abnormalities of gait and mobility (R26.89);Pain;Difficulty in walking, not elsewhere classified (R26.2) Pain - Right/Left: Left Pain - part of body: Hip     Time: 1023-1040 PT Time Calculation (min) (ACUTE ONLY): 17 min  Charges:  $Gait Training: 8-22 mins                     Blondell Reveal Kistler PT 02/19/2018  Acute Rehabilitation Services Pager [phone] Office [phone]

## 2018-02-19 NOTE — Progress Notes (Signed)
SATURATION QUALIFICATIONS: (This note is used to comply with regulatory documentation for home oxygen)  Patient Saturations on Room Air at Rest = 97%  Patient Saturations on Room Air while Ambulating = 82%  Patient Saturations on -- Liters of oxygen while Ambulating = RN to titrate  Philomena Doheny PT 02/19/2018  Acute Rehabilitation Services Pager [phone] Office [phone]

## 2018-02-19 NOTE — Progress Notes (Signed)
SATURATION QUALIFICATIONS: (This note is used to comply with regulatory documentation for home oxygen)  Patient Saturations on Room Air at Rest = 94%  Patient Saturations on Room Air while Ambulating = 82%  Patient Saturations on 2L Liters of oxygen while Ambulating =97 %  Please briefly explain why patient needs home oxygen: Pt desats easily after amb a few feet from room and in room. Admin oxygen to recover. Second amb attempt sat improved with 2L to 97%

## 2018-02-19 NOTE — Progress Notes (Signed)
Patient now requires O2. CSW updated FL2 and informed the [facility] staff.   Kathrin Greathouse, Marlinda Mike, MSW Clinical Social Worker  [phone] 02/19/2018  11:15 AM

## 2018-02-19 NOTE — Progress Notes (Signed)
Patient is set to discharge to [facility] today. Patient & family aware. Discharge packet given to RN. [facility] called for transport.   Kingsley Spittle, LCSW Clinical Social Worker [phone]

## 2018-02-19 NOTE — Discharge Summary (Addendum)
Physician Discharge Summary  Charles Randall. JKK:[id number] DOB: October 13, 1927 DOA: 02/12/2018  PCP: Nickola Major, MD  Admit date: 02/12/2018 Discharge date: 02/19/2018  Admitted From: Home  Disposition:  SNF  Recommendations for Outpatient Follow-up:  1. Follow up with PCP in 1-2 weeks 2. Please obtain BMP/CBC in one week your next doctors visit.  3. Advised fluid restriction about 1800 cc - 2000 cc daily. 4. Follow-up outpatient with cardiology in about 4 weeks 5. Follow-up outpatient with orthopedic, Dr. Alvan Dame in 2 weeks 6. Take iron supplements and bowel regimen as needed 7. Will neel O2 at 2L [place], Wean off a appropriate.  8. Take Augmentin orally BID for 3 days. Discontinue it on 02/22/18.  Discharge Condition: Stable Discharge equipment: home o2 2L [place] CODE STATUS: DNR Diet recommendation: Heart healthy.   Brief/Interim Summary: 84 year old with history of seizure, CKD stage III, moderate aortic regurgitation, diastolic CHF, seizure disorder, essential hypertension was brought to the hospital after sustaining a mechanical fall causing left hip fracture.  He underwent left hip arthroplasty on 12/17 and doing well.  Later in his hospital stay he developed hypoxia and a CT showed effusion and pulmonary edema therefore started receiving IV Lasix.  Echocardiogram was normal LV with mild diastolic dysfunction.  Patient was diuresed with IV Lasix and he did very well and his shortness of breath significantly improved.  Although at rest on room air he was saturating 97%, with ambulation his oxygen saturation dropped to 82% with quick recovery to again more than 95% on 2 L nasal cannula. Otherwise patient is stable at this time for discharge.  He can be weaned off the oxygen while at the rehab facility.   Discharge Diagnoses:  Active Problems:   Benign essential hypertension   Seizure disorder (HCC)   Partial symptomatic epilepsy with complex partial seizures, not intractable, without  status epilepticus ([place])   Closed fracture of neck of left femur (HCC)   Moderate aortic regurgitation   CKD (chronic kidney disease), stage III ([place])   Displaced fracture of left femoral neck (HCC)   Left displaced femoral neck fracture (HCC)   Acute diastolic CHF (congestive heart failure) (HCC)  Acute hypoxic respiratory distress, 82% with ambulation.  Acute diastolic CHF exacerbation -We will stop the diuresis as he is at a risk of dehydration and making him orthostatic. 2L [place] upon discharge. Can be weaned off at the facility.  - Advise limiting his fluid intake while at home and remaining consistent with it. - Echocardiogram shows ejection fraction 71-69%, grade 1 diastolic dysfunction.  Mild regurgitation.  Moderately increased pulmonary arterial pressures. -Follow outpatient with cardiology in about 3-4 weeks  Left femoral neck fracture status post left hip hemiarthroplasty 12/17 -We will need aspirin twice daily for DVT prophylaxis.  Follow-up outpatient with Dr. Alvan Dame in 2 weeks -Pain control, bowel regimen PRN  Acute blood loss anemia -Some lab suggestive of iron deficiency with anemia of chronic disease -Iron supplements, bowel regimen PRN  Essential hypertension -Well-controlled.    Resume his home medications  CKD stage III -Creatinine appears to be stable around 1.5  Seizure disorder -Continue Keppra  Vitamin B12 deficiency -Supplements  Ascending thoracic aortic aneurysm -Needs outpatient follow-up with CTA/MRA semiannually.  Aspirin 325 mg twice daily for DVT prophylaxis postoperatively Discharge today  Discharge Instructions  Discharge Instructions    Call MD for:  difficulty breathing, headache or visual disturbances   Complete by:  As directed    Call MD for:  extreme  fatigue   Complete by:  As directed    Call MD for:  hives   Complete by:  As directed    Call MD for:  persistant dizziness or light-headedness   Complete by:  As directed     Call MD for:  persistant nausea and vomiting   Complete by:  As directed    Call MD for:  redness, tenderness, or signs of infection (pain, swelling, redness, odor or green/yellow discharge around incision site)   Complete by:  As directed    Call MD for:  severe uncontrolled pain   Complete by:  As directed    Call MD for:  temperature >100.4   Complete by:  As directed    Diet - low sodium heart healthy   Complete by:  As directed    Discharge diet:   Complete by:  As directed    Diet recommendations: Regular;Thin liquid Liquids provided via: Cup;Straw Medication Administration: Whole meds with liquid(consider with applesauce) Supervision: Patient able to self feed Compensations: Small sips/bites;Slow rate(start intake with liquids) Postural Changes and/or Swallow Maneuvers: Out of bed for meals;Seated upright 90 degrees;Upright 30-60 min after meal            Oral Care Recommendations: Oral care BID Follow up Recommendations: None SLP Visit Diagnosis: Dysphagia, unspecified (R13.10) Plan: All goals met   Discharge instructions   Complete by:  As directed    You were seen for a hip fracture.  This was repaired by orthopedics.  Please follow up with your PCP to discuss treatment for osteoporosis after you've been discharged.  You had evidence of pneumonia on chest x ray.  We've started you on antibiotics and you'll complete this after you're discharged.  You should have a repeat chest x ray in a few weeks.  You had an esophagram which showed some dysmotility.  Please follow speech therapy recommendations.  Please follow up with your PCP.  You could consider GI follow up as an outpatient.   Return for new, recurrent, or worsening symptoms.  Please ask your PCP to request records from this hospitalization so they know what was done and what the next steps will be.   Increase activity slowly   Complete by:  As directed    Wean Patient To Room Air   Complete by:  As  directed    Currently on 2 L [place], wean off to room air over few days.   Weight bearing as tolerated   Complete by:  As directed    Laterality:  left   Extremity:  Lower     Allergies as of 02/19/2018   No Known Allergies     Medication List    STOP taking these medications   traMADol 50 MG tablet Commonly known as:  ULTRAM     TAKE these medications   amoxicillin-clavulanate 875-125 MG tablet Commonly known as:  AUGMENTIN Take 1 tablet by mouth every 12 (twelve) hours for 3 days.   aspirin 325 MG EC tablet Take 1 tablet (325 mg total) by mouth 2 (two) times daily.   ferrous sulfate 325 (65 FE) MG tablet Take 1 tablet (325 mg total) by mouth daily with breakfast.   HYDROcodone-acetaminophen 5-325 MG tablet Commonly known as:  NORCO Take 1 tablet by mouth every 6 (six) hours as needed for up to 3 days for moderate pain.   levETIRAcetam 750 MG tablet Commonly known as:  KEPPRA Take 2 tablets (1,500 mg total) by mouth 2 (two)  times daily.   lisinopril-hydrochlorothiazide 20-25 MG tablet Commonly known as:  PRINZIDE,ZESTORETIC Take 1 tablet by mouth daily.   LUBRICANT EYE DROPS 0.4-0.3 % Soln Generic drug:  Polyethyl Glycol-Propyl Glycol Place 1 drop into both eyes 3 (three) times daily as needed (for dry/irritated eyes.).   Magnesium 250 MG Tabs Take 250 mg by mouth daily.   methocarbamol 500 MG tablet Commonly known as:  ROBAXIN Take 1 tablet (500 mg total) by mouth every 6 (six) hours as needed for muscle spasms.   vitamin B-12 1000 MCG tablet Commonly known as:  CYANOCOBALAMIN Take 1,000 mcg by mouth daily.   vitamin C 1000 MG tablet Take 1,000 mg by mouth daily.   Vitamin D-3 25 MCG (1000 UT) Caps Take 1,000 Units by mouth daily.   vitamin E 400 UNIT capsule Take 400 Units by mouth daily.            Discharge Care Instructions  (From admission, onward)         Start     Ordered   02/13/18 0000  Weight bearing as tolerated    Question  Answer Comment  Laterality left   Extremity Lower      02/13/18 1604          Contact information for follow-up providers    Paralee Cancel, MD. Schedule an appointment as soon as possible for a visit in 2 weeks.   Specialty:  Orthopedic Surgery Contact information: [address] [place] [address] [phone]        Nickola Major, MD. Schedule an appointment as soon as possible for a visit in 1 week(s).   Specialty:  Family Medicine Contact information: [address] [place] [address] [phone]        Croitoru, Dani Gobble, MD. Schedule an appointment as soon as possible for a visit in 4 week(s).   Specialty:  Cardiology Contact information: [address] [place] [address] [phone]            Contact information for after-discharge care    Destination    [facility] .   Service:  Skilled Nursing Contact information: [place] [phone]                 No Known Allergies  You were cared for by a hospitalist during your hospital stay. If you have any questions about your discharge medications or the care you received while you were in the hospital after you are discharged, you can call the unit and asked to speak with the hospitalist on call if the hospitalist that took care of you is not available. Once you are discharged, your primary care physician will handle any further medical issues. Please note that no refills for any discharge medications will be authorized once you are discharged, as it is imperative that you return to your primary care physician (or establish a relationship with a primary care physician if you do not have one) for your aftercare needs so that they can reassess your need for medications and monitor your lab values.  Consultations:  Ortho   Procedures/Studies: Dg Chest 2 View  Result Date: 02/14/2018 CLINICAL  DATA:  Status post hip surgery yesterday. The patient is complaining of right-sided abdominal and chest discomfort. EXAM: CHEST - 2 VIEW COMPARISON:  Portable chest x-ray of February 12, 2018 FINDINGS: There is new increased density in the right lower lobe posteriorly. There is  subtle increased density in the left retrocardiac region as well. The cardiac silhouette is mildly enlarged. The pulmonary vascularity is not clearly engorged. There is calcification in the wall of the aortic arch. The bony thorax exhibits no acute abnormality. IMPRESSION: Findings worrisome for bibasilar pneumonia possibly due to aspiration. There is a small right pleural effusion. Subtle increased prominence of the pulmonary interstitium and increased cardiac silhouette size may reflect low-grade CHF as well. Electronically Signed   By: David  Martinique M.D.   On: 02/14/2018 14:48   Ct Angio Chest Pe W Or Wo Contrast  Result Date: 02/17/2018 CLINICAL DATA:  Shortness of breath, pleuritic chest pain, postoperative fever, bibasilar infiltrate question pneumonia or aspiration, elevated BNP question CHF EXAM: CT ANGIOGRAPHY CHEST WITH CONTRAST TECHNIQUE: Multidetector CT imaging of the chest was performed using the standard protocol during bolus administration of intravenous contrast. Multiplanar CT image reconstructions and MIPs were obtained to evaluate the vascular anatomy. CONTRAST:  141m ISOVUE-370 IOPAMIDOL (ISOVUE-370) INJECTION 76% IV COMPARISON:  04/16/2015 FINDINGS: Cardiovascular: Mild atherosclerotic calcifications aorta and coronary arteries. Aneurysmal dilatation of the ascending thoracic aorta 4.7 cm diameter. Minimal pericardial fluid. Pulmonary arteries adequately opacified and patent. No evidence of pulmonary embolism. Mediastinum/Nodes: Esophagus unremarkable. No thoracic adenopathy. Base of cervical region normal appearance. Lungs/Pleura: Moderate-sized pleural effusions bilaterally. Significant compressive atelectasis of  the lower lobes and posterior upper lungs. Minimal infiltrates in the upper lobes bilaterally question mild pulmonary edema. No pneumothorax or definite pulmonary mass. Upper Abdomen: Beam hardening artifacts from dense retained contrast in colon. Visualized upper abdomen otherwise unremarkable. Musculoskeletal: Bones demineralized with multiple thoracic compression fractures, unchanged. Review of the MIP images confirms the above findings. IMPRESSION: No evidence pulmonary embolism. Moderate-sized pleural effusions bilaterally with significant BILATERAL compressive atelectasis of the lungs. Mild infiltrates in upper lobes favor pulmonary edema. Aneurysmal dilatation ascending thoracic aorta, recommendation below: Ascending thoracic aortic aneurysm: Recommend semi-annual imaging followup by CTA or MRA and referral to cardiothoracic surgery if not already obtained. This recommendation follows 2010 ACCF/AHA/AATS/ACR/ASA/SCA/SCAI/SIR/STS/SVM Guidelines for the Diagnosis and Management of Patients With Thoracic Aortic Disease. Circulation. 2010; 121:: Z610-R604 Aortic Atherosclerosis (ICD10-I70.0). Aortic aneurysm NOS (ICD10-I71.9). Electronically Signed   By: MLavonia DanaM.D.   On: 02/17/2018 12:58   Dg Esophagus  Result Date: 02/16/2018 CLINICAL DATA:  Dysphagia EXAM: ESOPHOGRAM/BARIUM SWALLOW TECHNIQUE: Single contrast examination was performed using thin barium and barium tablet. FLUOROSCOPY TIME:  Fluoroscopy Time:  1 minutes 18 seconds Radiation Exposure Index (if provided by the fluoroscopic device): Number of Acquired Spot Images: 0 COMPARISON:  None. FINDINGS: Recent hip surgery with limited mobility. Moderate esophageal dysmotility. No stricture or mass. Pharyngeal swallowing normal. No aspiration or diverticulum. Small hiatal hernia Barium tablet did not pass into the stomach but made it to the distal esophagus. This is likely due to dysmotility and supine positioning. IMPRESSION: Moderate esophageal  dysmotility. No stricture or mass. Small hiatal hernia. Barium tablet made to the distal esophagus but not did not pass into the stomach likely due to dysmotility and supine position. Electronically Signed   By: CFranchot GalloM.D.   On: 02/16/2018 10:11   Pelvis Portable  Result Date: 02/13/2018 CLINICAL DATA:  Left hip hemiarthroplasty. EXAM: PORTABLE PELVIS 1-2 VIEWS COMPARISON:  Left hip x-rays from yesterday. FINDINGS: The left hip demonstrates a hemiarthroplasty without evidence of hardware failure or complication. There is expected intra-articular air. There is no fracture or dislocation. The alignment is anatomic. Post-surgical changes noted in the surrounding soft tissues. IMPRESSION: Interval left hip  hemiarthroplasty without evidence of acute postoperative complication. Electronically Signed   By: Titus Dubin M.D.   On: 02/13/2018 20:27   Dg Chest Port 1 View  Result Date: 02/12/2018 CLINICAL DATA:  Preop clearance EXAM: PORTABLE CHEST 1 VIEW COMPARISON:  CT 04/16/2015 FINDINGS: Tortuous atherosclerotic aorta. Normal heart size. Clear lungs. No acute osseous abnormality. IMPRESSION: 1. No active disease. 2. Mild aortic atherosclerosis and ectasia. Electronically Signed   By: Ashley Royalty M.D.   On: 02/12/2018 15:52   Dg Hip Unilat With Pelvis 2-3 Views Left  Result Date: 02/12/2018 CLINICAL DATA:  Patient fell last night striking the left side and was unable to get up and now with left hip pain. EXAM: DG HIP (WITH OR WITHOUT PELVIS) 2-3V LEFT COMPARISON:  None. FINDINGS: The patient has sustained an acute fracture of the base of the neck of the left femur. The femoral head remains normally related to the acetabulum. The intertrochanteric region appears intact as does the subtrochanteric region. IMPRESSION: Acute mildly angulated fracture through the neck of the left femur. No pelvic fracture is observed. Electronically Signed   By: David  Martinique M.D.   On: 02/12/2018 11:41      Subjective: Feels much better, no complaints.   General = no fevers, chills, dizziness, malaise, fatigue HEENT/EYES = negative for pain, redness, loss of vision, double vision, blurred vision, loss of hearing, sore throat, hoarseness, dysphagia Cardiovascular= negative for chest pain, palpitation, murmurs, lower extremity swelling Respiratory/lungs= negative for shortness of breath, cough, hemoptysis, wheezing, mucus production Gastrointestinal= negative for nausea, vomiting,, abdominal pain, melena, hematemesis Genitourinary= negative for Dysuria, Hematuria, Change in Urinary Frequency MSK = Negative for arthralgia, myalgias, Back Pain, Joint swelling  Neurology= Negative for headache, seizures, numbness, tingling  Psychiatry= Negative for anxiety, depression, suicidal and homocidal ideation Allergy/Immunology= Medication/Food allergy as listed  Skin= Negative for Rash, lesions, ulcers, itching   Discharge Exam: Vitals:   02/19/18 1045 02/19/18 1321  BP:  110/70  Pulse:  89  Resp:  18  Temp:  97.9 F (36.6 C)  SpO2: 97% 92%   Vitals:   02/18/18 2010 02/19/18 0433 02/19/18 1045 02/19/18 1321  BP: 112/63 130/74  110/70  Pulse: 65 70  89  Resp: _0 Temp: 98.1 F (36.7 C) 97.7 F (36.5 C)  97.9 F (36.6 C)  TempSrc: Oral Oral  Oral  SpO2: 92% 93% 97% 92%  Weight:      Height:        General: Pt is alert, awake, not in acute distress Cardiovascular: RRR, S1/S2 +, no rubs, no gallops Respiratory: CTA bilaterally, no wheezing, no rhonchi Abdominal: Soft, NT, ND, bowel sounds + Extremities: no edema, no cyanosis    The results of significant diagnostics from this hospitalization (including imaging, microbiology, ancillary and laboratory) are listed below for reference.     Microbiology: Recent Results (from the past 240 hour(s))  Surgical pcr screen     Status: None   Collection Time: 02/13/18  4:50 AM  Result Value Ref Range Status   MRSA, PCR  NEGATIVE NEGATIVE Final   Staphylococcus aureus NEGATIVE NEGATIVE Final    Comment: (NOTE) The Xpert SA Assay (FDA approved for NASAL specimens in patients 19 years of age and older), is one component of a comprehensive surveillance program. It is not intended to diagnose infection nor to guide or monitor treatment. Performed at [facility], [place] [address] [place], [place] [address]   Culture, blood (routine x  2)     Status: None   Collection Time: 02/14/18  6:16 PM  Result Value Ref Range Status   Specimen Description   Final    BLOOD RIGHT ANTECUBITAL Performed at [facility] [address] [place], [place] [address]    Special Requests   Final    BOTTLES DRAWN AEROBIC AND ANAEROBIC Blood Culture adequate volume Performed at [place] [address], [place], [place] [address]    Culture   Final    NO GROWTH 5 DAYS Performed at [facility], [place] [address], [place], [place] [address]    Report Status 02/19/2018 FINAL  Final  Culture, blood (routine x 2)     Status: None   Collection Time: 02/14/18  6:24 PM  Result Value Ref Range Status   Specimen Description   Final    BLOOD RIGHT WRIST Performed at [facility] [address] [place], [place] [address]    Special Requests   Final    BOTTLES DRAWN AEROBIC ONLY Blood Culture results may not be optimal due to an inadequate volume of blood received in culture bottles   Culture   Final    NO GROWTH 5 DAYS Performed at [facility], [place] [address] [place], [place] [address]    Report Status 02/19/2018 FINAL  Final     Labs: BNP (last 3 results) Recent Labs    02/14/18 1816 02/19/18 0435  BNP 226.2* 433.2*   Basic Metabolic Panel: Recent Labs  Lab 02/14/18 0631 02/15/18 0549 02/16/18 0538 02/17/18 0903 02/18/18 0523 02/19/18 0435  NA 137 137 139 139 137  --   K 3.8 3.7 3.6 3.5 3.5  --   CL 103 107 106 104 102   --   CO2 _0 --   GLUCOSE 150* 90 106* 107* 95  --   BUN 37* 37* 32* 31* 30*  --   CREATININE 1.26* 1.17 1.11 1.17 1.33* 1.33*  CALCIUM 7.9* 8.0* 7.9* 8.4* 8.1*  --   MG  --  2.0  --   --  2.0 1.9   Liver Function Tests: Recent Labs  Lab 02/18/18 0523  AST 18  ALT 12  ALKPHOS 81  BILITOT 1.1  PROT 5.3*  ALBUMIN 2.4*   No results for input(s): LIPASE, AMYLASE in the last 168 hours. No results for input(s): AMMONIA in the last 168 hours. CBC: Recent Labs  Lab 02/14/18 0631 02/15/18 0549 02/16/18 0538 02/17/18 0903 02/18/18 0523  WBC 9.6 8.4 10.2 11.6* 12.5*  HGB 10.2* 10.2* 9.9* 11.3* 10.5*  HCT 31.7* 31.6* 30.8* 36.1* 33.1*  MCV 94.1 92.7 95.1 94.5 92.5  PLT 157 179 187 266 295   Cardiac Enzymes: No results for input(s): CKTOTAL, CKMB, CKMBINDEX, TROPONINI in the last 168 hours. BNP: Invalid input(s): POCBNP CBG: No results for input(s): GLUCAP in the last 168 hours. D-Dimer No results for input(s): DDIMER in the last 72 hours. Hgb A1c No results for input(s): HGBA1C in the last 72 hours. Lipid Profile No results for input(s): CHOL, HDL, LDLCALC, TRIG, CHOLHDL, LDLDIRECT in the last 72 hours. Thyroid function studies No results for input(s): TSH, T4TOTAL, T3FREE, THYROIDAB in the last 72 hours.  Invalid input(s): FREET3 Anemia work up No results for input(s): VITAMINB12, FOLATE, FERRITIN, TIBC, IRON, RETICCTPCT in the last 72 hours. Urinalysis    Component Value Date/Time   COLORURINE YELLOW 10/13/2010 1103   APPEARANCEUR  CLOUDY (A) 10/13/2010 1103   LABSPEC 1.015 10/13/2010 1103   PHURINE 5.5 10/13/2010 1103   GLUCOSEU NEGATIVE 10/13/2010 1103   HGBUR TRACE (A) 10/13/2010 1103   BILIRUBINUR NEGATIVE 10/13/2010 1103   KETONESUR NEGATIVE 10/13/2010 1103   PROTEINUR 100 (A) 10/13/2010 1103   UROBILINOGEN 0.2 10/13/2010 1103   NITRITE NEGATIVE 10/13/2010 1103   LEUKOCYTESUR NEGATIVE 10/13/2010 1103   Sepsis Labs Invalid input(s):  PROCALCITONIN,  WBC,  LACTICIDVEN Microbiology Recent Results (from the past 240 hour(s))  Surgical pcr screen     Status: None   Collection Time: 02/13/18  4:50 AM  Result Value Ref Range Status   MRSA, PCR NEGATIVE NEGATIVE Final   Staphylococcus aureus NEGATIVE NEGATIVE Final    Comment: (NOTE) The Xpert SA Assay (FDA approved for NASAL specimens in patients 75 years of age and older), is one component of a comprehensive surveillance program. It is not intended to diagnose infection nor to guide or monitor treatment. Performed at [facility], [place] [address] [place], [place] [address]   Culture, blood (routine x 2)     Status: None   Collection Time: 02/14/18  6:16 PM  Result Value Ref Range Status   Specimen Description   Final    BLOOD RIGHT ANTECUBITAL Performed at [place] [address] [place], [place] [address]    Special Requests   Final    BOTTLES DRAWN AEROBIC AND ANAEROBIC Blood Culture adequate volume Performed at [facility] [address], [place], [place] [address]    Culture   Final    NO GROWTH 5 DAYS Performed at [facility], [place] [address], [place], [place] [address]    Report Status 02/19/2018 FINAL  Final  Culture, blood (routine x 2)     Status: None   Collection Time: 02/14/18  6:24 PM  Result Value Ref Range Status   Specimen Description   Final    BLOOD RIGHT WRIST Performed at [facility] [address], [place], [place] [address]    Special Requests   Final    BOTTLES DRAWN AEROBIC ONLY Blood Culture results may not be optimal due to an inadequate volume of blood received in culture bottles   Culture   Final    NO GROWTH 5 DAYS Performed at [facility], [place] [address], [place], [place] [address]    Report Status 02/19/2018 FINAL  Final     Time coordinating discharge:  I have spent 35 minutes face to face with the patient and on the ward  discussing the patients care, assessment, plan and disposition with other care givers. >50% of the time was devoted counseling the patient about the risks and benefits of treatment/Discharge disposition and coordinating care.   SIGNED:   Damita Lack, MD  [facility] 02/19/2018, 3:22 PM Pager   If 7PM-7AM, please contact night-coverage [email or web address] Password [email or web address]

## 2018-04-04 ENCOUNTER — Ambulatory Visit (INDEPENDENT_AMBULATORY_CARE_PROVIDER_SITE_OTHER): Payer: Medicare Other | Admitting: Diagnostic Neuroimaging

## 2018-04-04 ENCOUNTER — Encounter: Payer: Self-pay | Admitting: Diagnostic Neuroimaging

## 2018-04-04 VITALS — BP 115/59 | HR 75 | Ht 66.0 in | Wt 140.9 lb

## 2018-04-04 DIAGNOSIS — G40909 Epilepsy, unspecified, not intractable, without status epilepticus: Secondary | ICD-10-CM

## 2018-04-04 DIAGNOSIS — G40209 Localization-related (focal) (partial) symptomatic epilepsy and epileptic syndromes with complex partial seizures, not intractable, without status epilepticus: Secondary | ICD-10-CM | POA: Diagnosis not present

## 2018-04-04 MED ORDER — LEVETIRACETAM 750 MG PO TABS: 1500.0000 mg | ORAL_TABLET | Freq: Two times a day (BID) | ORAL | 4 refills | Status: DC

## 2018-04-04 MED ORDER — CARBAMAZEPINE ER 100 MG PO CP12: 100.0000 mg | ORAL_CAPSULE | Freq: Two times a day (BID) | ORAL | 12 refills | Status: DC

## 2018-04-04 NOTE — Progress Notes (Addendum)
[facility]  PATIENT: Charles Randall. DOB: April 30, 1927  REFERRING CLINICIAN:  HISTORY FROM: patient and SO REASON FOR VISIT: follow up   HISTORICAL  CHIEF COMPLAINT:  Chief Complaint  Patient presents with  . Seizures    rm 7, sig other- Tamela, "one seizure on 1/24 x 1 hour, 1/26 three seizures"  . Follow-up    sooner requested due to seizures    HISTORY OF PRESENT ILLNESS:   UPDATE (04/04/18, VRP): Since last visit, was doing well until 03/23/18 and 03/25/18 (multiple breakthrough seizures). Was 1 month post-op from surgery (hip fracture). Now stable. No other triggering factors. Tolerating levetiracetam 1500mg  twice a day.   PCP discontinued aspirin 81mg  daily in March 2019 (? Age or bleeding risk).   UPDATE (09/04/2017, CM): Charles Randall, 85 year old male returns for follow-up with history of seizure disorder /TIA.He is currently on aspirin for secondary stroke prevention without further TIA symptoms .He remains on Keppra 750 mg 2 tablets twice daily he denies any side effects to his medication.  He ambulates with a rolling walker.  No recent falls.  He returns for reevaluation.  UPDATE 08/30/16 VRP: Since last visit, doing well. No more TIA or seizures. Tolerating medications.   UPDATE 05/23/16: Since last visit, was doing well until 2 weeks ago, had 10 min episode of right face drooping, slurred speech, right hand tingling (~05/02/16). Then resolved. Did not seek medical attention. Saw Dr. Hollie Salk, and she referred patient to see [place]. No other alleviating or aggravating factors.  UPDATE 11/16/15: Since last visit, doing well. No definite seizures. Tolerating LEV 1500mg  BID. Patient not driving, due to concern from his friend about poss of un-witnessed seizures. No indication of this, but rather the theoretical possibility.   UPDATE 05/12/15: Since last visit, still having 1-2 seizures per year (staring spells; mouth automatisms) --> 2014 x 1; 2015 x 2; 2016 x 2. Still  with interrupted sleep. Tolerating LEV 1000mg  BID.  UPDATE 05/29/12: Doing well. No seizures. Tolerating medications.  UPDATE 11/29/11: Has not had any seizure episodes. Tolerating LEV 1000mg  BID, tolerating well without mood changes or increased dizziness. No complaints today. He has been driving when he absolutely has no other way.   UPDATE 08/29/11: He has had 2 seizures on 08/11/11. Were similar to his previous seizures. Denies bowel/bladder incontinence. Denies sleep changes, missed doses, fever or poor food/fluid intake. He was increased to 1000mg  BID, tolerating well without dizziness or mood changes.   UPDATE 10/18/10: Had another spell on 10/13/10. Woke up normally, read paper. Then around 9am, was found by girlfried, slumped against wall in bedroom, with blood from mouth. Taken to ER by EMS. He was somewhat combative and confused for 2-3 hours. CT head and neck unremarkable. Started on LEV 500mg  BID. No further events. Family reports poor sleep patterns, snoring and daytime sleepiness. Sleeps 5 hrs per night, tossing and turning. Able to fall asleep very easily.  UPDATE 06/30/10: No further events. Last spell 03/15/10. Not driving. Feels good. Testing reviewed.  PRIOR HPI: 85 yo right-handed Caucasian male with a past medical history of HTN, hyperlipidemia, post-polio syndrome, vitamin D deficiency, osteoporosis with history of compression fracture, left inguinal hernia, and eczema who presents for evaluation for possible atypical seizures. He is accompanied today by his long-term girlfriend who helps provide details regarding these episodes. Starting about 2-3 years ago and occurring about every 6 months, the patient has had spells where he will "blank out" for about 20-30 minutes. He reports  complete amnesia during the spells, but denies any other symptoms occurring during or after the episodes. Other than a possible "warning surge" in his head immediately prior to the onset of  the episodes, he denies any symptoms preceding the episodes. He has a hard time describing what the "warning surge" is. His girlfriend has noticed that during the episodes he has a "blank stare," "his face gets really white," and he will ask many questions over and over such as: "Where are we?" and "What are we doing here?" She denies any other signs, including loss of postural control, loss of bowel or bladder continence, orofacial twitching, tongue biting, etc. Immediately following the episodes, the patient is back to his normal self. 08/2008 to 09/2008: MRI brain w/ and w/out, MRA w/out, carotid dopplers, EEG, and neurocognitive battery all unrevealing. 10-hour ambulatory EEG on 12/2009 which was negative. Most recent episode: March 15, 2010 around 12:30 PM, and was different from previous four episodes. The girlfriend found him "slumped down" in a chair with his legs and arms "as stiff as a board" (arms flexed at elbow, legs flexed at knees, eyes closed). She was able to rouse him after about 3-4 minutes, and he reports being "as weak as water" afterward, as well as very tired, which led him to sleep for the rest of the day.   REVIEW OF SYSTEMS: Full 14 system review of systems performed and negative with exception of: joint pain dizziness eye itching.   ALLERGIES: No Known Allergies  HOME MEDICATIONS: Outpatient Medications Prior to Visit  Medication Sig Dispense Refill  . Ascorbic Acid (VITAMIN C) 1000 MG tablet Take 1,000 mg by mouth daily.    . Cholecalciferol (VITAMIN D-3) 1000 UNITS CAPS Take 1,000 Units by mouth daily.     Marland Kitchen levETIRAcetam (KEPPRA) 750 MG tablet Take 2 tablets (1,500 mg total) by mouth 2 (two) times daily. 360 tablet 4  . lisinopril-hydrochlorothiazide (PRINZIDE,ZESTORETIC) 20-25 MG tablet Take 1 tablet by mouth daily.   0  . Magnesium 250 MG TABS Take 250 mg by mouth daily.    . Melatonin 5 MG CAPS Take 5 mg by mouth at bedtime.    Vladimir Faster Glycol-Propyl Glycol  (LUBRICANT EYE DROPS) 0.4-0.3 % SOLN Place 1 drop into both eyes 3 (three) times daily as needed (for dry/irritated eyes.).    Marland Kitchen vitamin B-12 (CYANOCOBALAMIN) 1000 MCG tablet Take 1,000 mcg by mouth daily.    . vitamin E 400 UNIT capsule Take 400 Units by mouth daily.     Marland Kitchen aspirin EC 325 MG EC tablet Take 1 tablet (325 mg total) by mouth 2 (two) times daily. (Patient not taking: Reported on 04/04/2018) 30 tablet 0  . methocarbamol (ROBAXIN) 500 MG tablet Take 1 tablet (500 mg total) by mouth every 6 (six) hours as needed for muscle spasms. (Patient not taking: Reported on 04/04/2018) 40 tablet 0  . ferrous sulfate 325 (65 FE) MG tablet Take 1 tablet (325 mg total) by mouth daily with breakfast. 30 tablet 0   No facility-administered medications prior to visit.     PAST MEDICAL HISTORY: Past Medical History:  Diagnosis Date  . Anemia    STARTED PILLS 1 MONTH AGO NEW DX  . Aortic insufficiency    mild, moderate  . Arthritis    FINGERS SWELL PT THINKS ARTHITIS, TOP OF WRIST ALSO SWELLS ONCE IN A WHILE  . Bleeds easily ([place])    BRUISE EASY ALSO  . Chronic kidney disease   . Cough  LAST 3 TO 4 WEEKS PHLEGM YELLOW INTERMITTENT COUGH NO FEVER  . Dyslipidemia   . H/O dizziness    OCC   . H/O Doppler ultrasound 2010  . H/O echocardiogram 2013   aortic valve disorder, EF =>55%  . H/O echocardiogram 2012   AVD, EF =>55%  . Heart murmur   . History of hiatal hernia   . HTN (hypertension)   . Inguinal hernia   . Mitral regurgitation    mild  . Pneumonia FEW YEARS AGO   X 1  . Post-polio syndrome age 76  . Seizures ([place])    last sz 2016 NO CAUSE FOUND, 3 seizures on 03/25/18   . Swelling of both lower extremities    DOES NOT ALWAYS DO DOWN   . Tricuspid regurgitation    mild    PAST SURGICAL HISTORY: Past Surgical History:  Procedure Laterality Date  . COLONSCOPY  5 YRS AGO  . DENTAL SURGERY  3-4 WEEKS AGO  . FEMORAL HERNIA REPAIR Right 08/17/2017   Procedure: HERNIA REPAIR  FEMORAL LAPAROSCOPIC;  Surgeon: Michael Boston, MD;  Location: [facility];  Service: General;  Laterality: Right;  . INGUINAL HERNIA REPAIR Bilateral 08/17/2017   Procedure: LAPAROSCOPIC BILATERAL INGUINAL HERNIA REPAIR;  Surgeon: Michael Boston, MD;  Location: [facility];  Service: General;  Laterality: Bilateral;  . INSERTION OF MESH Bilateral 08/17/2017   Procedure: INSERTION OF MESH;  Surgeon: Michael Boston, MD;  Location: [facility];  Service: General;  Laterality: Bilateral;  . TOTAL HIP ARTHROPLASTY Left 02/13/2018   Procedure: HEMI  ARTHROPLASTY;  Surgeon: Paralee Cancel, MD;  Location: [facility];  Service: Orthopedics;  Laterality: Left;    FAMILY HISTORY: Family History  Problem Relation Age of Onset  . Cancer Mother   . Heart disease Father   . Cancer Maternal Grandmother   . Cancer Maternal Grandfather   . Kidney failure Sister     SOCIAL HISTORY:  Social History   Socioeconomic History  . Marital status: Unknown    Spouse name: Not on file  . Number of children: 2  . Years of education: BA [employer]  . Highest education level: Not on file  Occupational History  . Occupation: Retired  Scientific laboratory technician  . Financial resource strain: Not on file  . Food insecurity:    Worry: Not on file    Inability: Not on file  . Transportation needs:    Medical: Not on file    Non-medical: Not on file  Tobacco Use  . Smoking status: Former Smoker    Packs/day: 0.50    Years: 15.00    Pack years: 7.50    Last attempt to quit: 03/01/1963    Years since quitting: 55.1  . Smokeless tobacco: Never Used  Substance and Sexual Activity  . Alcohol use: No  . Drug use: No  . Sexual activity: Not on file  Lifestyle  . Physical activity:    Days per week: Not on file    Minutes per session: Not on file  . Stress: Not on file  Relationships  . Social connections:    Talks on phone: Not on file    Gets together: Not on file    Attends religious service: Not on file    Active member of club or  organization: Not on file    Attends meetings of clubs or organizations: Not on file    Relationship status: Not on file  . Intimate partner violence:    Fear of current  or ex partner: Not on file    Emotionally abused: Not on file    Physically abused: Not on file    Forced sexual activity: Not on file  Other Topics Concern  . Not on file  Social History Narrative   85 year old right-handed male who lives at home alone.  He is single (has long-term girlfriend) and has two children.    He drinks one cup caffeinated coffee daily.     PHYSICAL EXAM  GENERAL EXAM/CONSTITUTIONAL: Vitals:  Vitals:   04/04/18 1359  BP: (!) 115/59  Pulse: 75  Weight: 140 lb 14.4 oz (63.9 kg)  Height: 5\' 6"  (1.676 m)   Body mass index is 22.74 kg/m. No exam data present  Patient is in no distress; well developed, nourished and groomed; neck is supple  CARDIOVASCULAR:  Examination of carotid arteries is normal; no carotid bruits  Regular rate and rhythm, no murmurs  Examination of peripheral vascular system by observation and palpation is NOTABLE FOR DUSKY FEET AND VENOUS STASIS BILATERALLY  EYES:  Ophthalmoscopic exam of optic discs and posterior segments is normal; no papilledema or hemorrhages  MUSCULOSKELETAL:  Gait, strength, tone, movements noted in Neurologic exam below  NEUROLOGIC: MENTAL STATUS:  No flowsheet data found.  awake, alert, oriented to person, place and time  recent and remote memory intact  normal attention and concentration  language fluent, comprehension intact, naming intact,   fund of knowledge appropriate  CRANIAL NERVE:   2nd - no papilledema on fundoscopic exam  2nd, 3rd, 4th, 6th - pupils equal and reactive to light, visual fields full to confrontation, extraocular muscles intact, no nystagmus; MILD LEFT PTOSIS (POST-SURGICAL)  5th - facial sensation symmetric  7th - facial strength symmetric  8th - hearing intact  9th - palate elevates  symmetrically, uvula midline  11th - shoulder shrug symmetric  12th - tongue protrusion midline  MOTOR:   normal bulk and tone, full strength in the BUE, BLE; EXCEPT FOR BILATERAL FOOT DROP (RIGHT 3, LEFT 2-3)  LEFT HIP FLEXION 2-3  SENSORY:   normal and symmetric to light touch, temperature, vibration  COORDINATION:   finger-nose-finger, fine finger movements normal  REFLEXES:   deep tendon reflexes --> BUE 1; BLE 0  GAIT/STATION:   narrow based gait; USES WALKER; SLIGHTLY UNSTEADY; CANNOT WALK ON HEELS OR TOES (FOOT DROP RELATED TO POLIO SYNDROME)    DIAGNOSTIC DATA (LABS, IMAGING, TESTING) - I reviewed patient records, labs, notes, testing and imaging myself where available.  Lab Results  Component Value Date   WBC 12.5 (H) 02/18/2018   HGB 10.5 (L) 02/18/2018   HCT 33.1 (L) 02/18/2018   MCV 92.5 02/18/2018   PLT 295 02/18/2018      Component Value Date/Time   NA 134 (L) 02/19/2018 1508   K 3.3 (L) 02/19/2018 1508   CL 96 (L) 02/19/2018 1508   CO2 29 02/19/2018 1508   GLUCOSE 147 (H) 02/19/2018 1508   BUN 31 (H) 02/19/2018 1508   CREATININE 1.36 (H) 02/19/2018 1508   CREATININE 2.11 (H) 02/16/2016 1144   CALCIUM 7.8 (L) 02/19/2018 1508   PROT 5.3 (L) 02/18/2018 0523   ALBUMIN 2.4 (L) 02/18/2018 0523   AST 18 02/18/2018 0523   ALT 12 02/18/2018 0523   ALKPHOS 81 02/18/2018 0523   BILITOT 1.1 02/18/2018 0523   GFRNONAA 46 (L) 02/19/2018 1508   GFRAA 53 (L) 02/19/2018 1508   No results found for: CHOL, HDL, LDLCALC, LDLDIRECT, TRIG, CHOLHDL No  results found for: HGBA1C Lab Results  Component Value Date   UMPNTIRW43 154 02/15/2018   No results found for: TSH  10/17/14 lipid panel: CHOL 200, TRIG 91, HDL 65, LDL 117  05/04/10 EEG - normal  05/11/10 MRI BRAIN  - mild chronic small vessel ischemic disease.  03/04/16 TTE - Left ventricle: The cavity size was normal. Wall thickness was   normal. Systolic function was normal. The estimated ejection    fraction was in the range of 60% to 65%. Wall motion was normal;   there were no regional wall motion abnormalities. Features are   consistent with a pseudonormal left ventricular filling pattern,   with concomitant abnormal relaxation and increased filling   pressure (grade 2 diastolic dysfunction). - Aortic valve: Moderately calcified annulus. Moderately thickened,   moderately calcified leaflets. There was moderate regurgitation. - Mitral valve: There was mild regurgitation. - Pulmonary arteries: Systolic pressure was moderately increased.   PA peak pressure: 44 mm Hg (S).  04/08/16 EKG [I reviewed images myself and agree with interpretation. -VRP]  - normal sinus rhythm   05/20/16 carotid u/s - Minimal to moderate amount of bilateral atherosclerotic plaque, left greater than right, unchanged to minimally progressed compared to the 08/2008 examination though again not resulting in elevated peak systolic velocities within either internal carotid artery.  05/23/16 MRI brain (without) [I reviewed images myself and agree with interpretation. -VRP] 1. Mild perisylvian and temporal atrophy.  2. Mild periventricular and subcortical chronic small vessel ischemic disease.  3. No acute findings. 4. Compared to MRI on 09/18/08, there has been progression of atrophy and chronic small vessel ischemic disease.       ASSESSMENT AND PLAN  85 y.o. 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 Last seizure was in 12/09/14.   Also with possible TIA on week of 05/02/16, right face drooping, right hand tingling, and slurred speech.  Last seizure 03/25/18.   Dx: TIA vs seizure  1. Seizure disorder ([place])   2. Partial symptomatic epilepsy with complex partial seizures, not intractable, without status epilepticus ([place])      PLAN:  SEIZURE DISORDER (established problem, worsened)  - continue LEV 1500mg  twice a day  -  add carbamazepine 100mg  twice a day   - According to [place] law, you can not drive unless you are seizure / syncope free for at least 6 months and under physician's care.   - Please maintain precautions. Do not participate in activities where a loss of awareness could harm you or someone else. No swimming alone, no tub bathing, no hot tubs, no driving, no operating motorized vehicles (cars, ATVs, motocycles, etc), lawnmowers, power tools or firearms. No standing at heights, such as rooftops, ladders or stairs. Avoid hot objects such as stoves, heaters, open fires. Wear a helmet when riding a bicycle, scooter, skateboard, etc. and avoid areas of traffic. Set your water heater to 120 degrees or less.   TIA / stroke prevention (established problem, stable) - follow up with PCP re: BP, lipid and diabetes screening and mgmt - aspirin was stopped by PCP in March 2019 (? Age ot bleeding risk)  Meds ordered this encounter  Medications  . carbamazepine (CARBATROL) 100 MG 12 hr capsule    Sig: Take 1 capsule (100 mg total) by mouth 2 (two) times daily.    Dispense:  60 capsule    Refill:  12  . levETIRAcetam (KEPPRA) 750 MG tablet  Sig: Take 2 tablets (1,500 mg total) by mouth 2 (two) times daily.    Dispense:  360 tablet    Refill:  4   Return in about 8 months (around 12/03/2018).     Penni Bombard, MD 03/08/7586, 3:25 PM Certified in Neurology, Neurophysiology and Neuroimaging  [facility] [address] [place], [place] [address] [phone]

## 2018-04-04 NOTE — Patient Instructions (Signed)
SEIZURE DISORDER (established problem, worsened)  - continue levetiracetam 1500mg  twice a day  - add carbamazepine 100mg  twice a day   - According to  law, you can not drive unless you are seizure / syncope free for at least 6 months and under physician's care.   - Please maintain precautions. Do not participate in activities where a loss of awareness could harm you or someone else. No swimming alone, no tub bathing, no hot tubs, no driving, no operating motorized vehicles (cars, ATVs, motocycles, etc), lawnmowers, power tools or firearms. No standing at heights, such as rooftops, ladders or stairs. Avoid hot objects such as stoves, heaters, open fires. Wear a helmet when riding a bicycle, scooter, skateboard, etc. and avoid areas of traffic. Set your water heater to 120 degrees or less.   TIA / stroke prevention (established problem, stable) - follow up with PCP re: BP, lipid and diabetes screening and mgmt - aspirin was stopped by PCP in March 2019 (? Age ot bleeding risk)

## 2018-08-16 ENCOUNTER — Other Ambulatory Visit: Payer: Self-pay

## 2018-08-16 ENCOUNTER — Ambulatory Visit (INDEPENDENT_AMBULATORY_CARE_PROVIDER_SITE_OTHER): Payer: Medicare Other | Admitting: Podiatry

## 2018-08-16 VITALS — Temp 97.6°F

## 2018-08-16 DIAGNOSIS — L89899 Pressure ulcer of other site, unspecified stage: Secondary | ICD-10-CM | POA: Diagnosis not present

## 2018-08-16 DIAGNOSIS — L89620 Pressure ulcer of left heel, unstageable: Secondary | ICD-10-CM

## 2018-08-16 NOTE — Progress Notes (Signed)
Subjective:  Patient ID: Charles Big., male    DOB: 05-08-1927,  MRN: [id number]  Chief Complaint  Patient presents with  . Wound Check    Pt states left posterior heel wound that is painful with pressure applied, no known cause, 8 month duration. Pt states it drained 7 months ago, but hasn't since then.  . Wound Check    Pt states right lateral ankle wound that is painful with pressure applied, 1 year duration, no known cause, has never had drainage.    85 y.o. male presents with the above complaint. History above confirmed with patient.  Review of Systems: Negative except as noted in the HPI. Denies N/V/F/Ch.  Past Medical History:  Diagnosis Date  . Anemia    STARTED PILLS 1 MONTH AGO NEW DX  . Aortic insufficiency    mild, moderate  . Arthritis    FINGERS SWELL PT THINKS ARTHITIS, TOP OF WRIST ALSO SWELLS ONCE IN A WHILE  . Bleeds easily ([place])    BRUISE EASY ALSO  . Chronic kidney disease   . Cough    LAST 3 TO 4 WEEKS PHLEGM YELLOW INTERMITTENT COUGH NO FEVER  . Dyslipidemia   . H/O dizziness    OCC   . H/O Doppler ultrasound 2010  . H/O echocardiogram 2013   aortic valve disorder, EF =>55%  . H/O echocardiogram 2012   AVD, EF =>55%  . Heart murmur   . History of hiatal hernia   . HTN (hypertension)   . Inguinal hernia   . Mitral regurgitation    mild  . Pneumonia FEW YEARS AGO   X 1  . Post-polio syndrome age 46  . Seizures ([place])    last sz 2016 NO CAUSE FOUND, 3 seizures on 03/25/18   . Swelling of both lower extremities    DOES NOT ALWAYS DO DOWN   . Tricuspid regurgitation    mild    Current Outpatient Medications:  .  Ascorbic Acid (VITAMIN C) 1000 MG tablet, Take 1,000 mg by mouth daily., Disp: , Rfl:  .  aspirin EC 325 MG EC tablet, Take 1 tablet (325 mg total) by mouth 2 (two) times daily., Disp: 30 tablet, Rfl: 0 .  carbamazepine (CARBATROL) 100 MG 12 hr capsule, Take 1 capsule (100 mg total) by mouth 2 (two) times daily., Disp: 60 capsule,  Rfl: 12 .  Cholecalciferol (VITAMIN D-3) 1000 UNITS CAPS, Take 1,000 Units by mouth daily. , Disp: , Rfl:  .  levETIRAcetam (KEPPRA) 750 MG tablet, Take 2 tablets (1,500 mg total) by mouth 2 (two) times daily., Disp: 360 tablet, Rfl: 4 .  lisinopril-hydrochlorothiazide (PRINZIDE,ZESTORETIC) 20-25 MG tablet, Take 1 tablet by mouth daily. , Disp: , Rfl: 0 .  Magnesium 250 MG TABS, Take 250 mg by mouth daily., Disp: , Rfl:  .  Melatonin 5 MG CAPS, Take 5 mg by mouth at bedtime., Disp: , Rfl:  .  methocarbamol (ROBAXIN) 500 MG tablet, Take 1 tablet (500 mg total) by mouth every 6 (six) hours as needed for muscle spasms., Disp: 40 tablet, Rfl: 0 .  Polyethyl Glycol-Propyl Glycol (LUBRICANT EYE DROPS) 0.4-0.3 % SOLN, Place 1 drop into both eyes 3 (three) times daily as needed (for dry/irritated eyes.)., Disp: , Rfl:  .  triamcinolone ointment (KENALOG) 0.1 %, APPLY OINTMENT TOPICALLY TWICE DAILY AS NEEDED FOR FLARES, Disp: , Rfl:  .  vitamin B-12 (CYANOCOBALAMIN) 1000 MCG tablet, Take 1,000 mcg by mouth daily., Disp: , Rfl:  .  vitamin E 400 UNIT capsule, Take 400 Units by mouth daily. , Disp: , Rfl:   Social History   Tobacco Use  Smoking Status Former Smoker  . Packs/day: 0.50  . Years: 15.00  . Pack years: 7.50  . Quit date: 03/01/1963  . Years since quitting: 55.4  Smokeless Tobacco Never Used    No Known Allergies Objective:   Vitals:   08/16/18 1106  Temp: 97.6 F (36.4 C)   There is no height or weight on file to calculate BMI. Constitutional Well developed. Well nourished.  Vascular Dorsalis pedis pulses palpable bilaterally. Posterior tibial pulses palpable bilaterally. Capillary refill normal to all digits.  No cyanosis or clubbing noted. Pedal hair growth normal.  Neurologic Normal speech. Oriented to person, place, and time. Epicritic sensation to light touch grossly present bilaterally.  Dermatologic Nails normal Painful HPK posterior heel left at the calcaneus and  lateral malleolus.  Orthopedic: Normal joint ROM without pain or crepitus bilaterally. No visible deformities. No bony tenderness.   Radiographs: None Assessment:   1. Pressure ulcer, heel, left, unstageable ([place])   2. Pressure ulcer of right leg    Plan:  Patient was evaluated and treated and all questions answered.  Pressure pre-ulcerative calluses. -Discussed etiology -Dispensed offloading pillows. -Lesions gently debrided -Discussed proper shoegear to prevent worsening and recurrence.  No follow-ups on file.

## 2018-09-10 ENCOUNTER — Ambulatory Visit: Payer: Medicare Other | Admitting: Nurse Practitioner

## 2018-10-08 ENCOUNTER — Encounter: Payer: Self-pay | Admitting: Diagnostic Neuroimaging

## 2018-10-08 ENCOUNTER — Ambulatory Visit (INDEPENDENT_AMBULATORY_CARE_PROVIDER_SITE_OTHER): Payer: Medicare Other | Admitting: Diagnostic Neuroimaging

## 2018-10-08 ENCOUNTER — Other Ambulatory Visit: Payer: Self-pay

## 2018-10-08 VITALS — BP 100/64 | HR 68 | Temp 98.4°F | Ht 66.0 in | Wt 126.6 lb

## 2018-10-08 DIAGNOSIS — G40209 Localization-related (focal) (partial) symptomatic epilepsy and epileptic syndromes with complex partial seizures, not intractable, without status epilepticus: Secondary | ICD-10-CM | POA: Diagnosis not present

## 2018-10-08 DIAGNOSIS — G40909 Epilepsy, unspecified, not intractable, without status epilepticus: Secondary | ICD-10-CM

## 2018-10-08 DIAGNOSIS — G14 Postpolio syndrome: Secondary | ICD-10-CM

## 2018-10-08 MED ORDER — LEVETIRACETAM 750 MG PO TABS: 1500.0000 mg | ORAL_TABLET | Freq: Two times a day (BID) | ORAL | 4 refills | Status: DC

## 2018-10-08 MED ORDER — CARBAMAZEPINE ER 100 MG PO CP12: 100.0000 mg | ORAL_CAPSULE | Freq: Two times a day (BID) | ORAL | 4 refills | Status: DC

## 2018-10-08 NOTE — Progress Notes (Signed)
[facility]  PATIENT: Charles Randall. DOB: 1927-06-22  REFERRING CLINICIAN:  HISTORY FROM: patient and wife REASON FOR VISIT: follow up   HISTORICAL  CHIEF COMPLAINT:  Chief Complaint  Patient presents with   Seizures    rm 7 FU, wife- Tamela, "no seizure activity; loss of appetite with weight loss"    HISTORY OF PRESENT ILLNESS:   UPDATE (10/08/18, VRP): Since last visit, doing well. No seizures. Tolerating meds. No alleviating or aggravating factors. Having some loss of appetite, disinterest in activities.   UPDATE (04/04/18, VRP): Since last visit, was doing well until 03/23/18 and 03/25/18 (multiple breakthrough seizures). Was 1 month post-op from surgery (hip fracture). Now stable. No other triggering factors. Tolerating levetiracetam 1500mg  twice a day.   PCP discontinued aspirin 81mg  daily in March 2019 (? Age or bleeding risk).   UPDATE (09/04/2017, CM): Charles Randall, 85 year old male returns for follow-up with history of seizure disorder /TIA.He is currently on aspirin for secondary stroke prevention without further TIA symptoms .He remains on Keppra 750 mg 2 tablets twice daily he denies any side effects to his medication.  He ambulates with a rolling walker.  No recent falls.  He returns for reevaluation.  UPDATE 08/30/16 VRP: Since last visit, doing well. No more TIA or seizures. Tolerating medications.   UPDATE 05/23/16: Since last visit, was doing well until 2 weeks ago, had 10 min episode of right face drooping, slurred speech, right hand tingling (~05/02/16). Then resolved. Did not seek medical attention. Saw Dr. Hollie Salk, and she referred patient to see [place]. No other alleviating or aggravating factors.  UPDATE 11/16/15: Since last visit, doing well. No definite seizures. Tolerating LEV 1500mg  BID. Patient not driving, due to concern from his friend about poss of un-witnessed seizures. No indication of this, but rather the theoretical possibility.   UPDATE  05/12/15: Since last visit, still having 1-2 seizures per year (staring spells; mouth automatisms) --> 2014 x 1; 2015 x 2; 2016 x 2. Still with interrupted sleep. Tolerating LEV 1000mg  BID.  UPDATE 05/29/12: Doing well. No seizures. Tolerating medications.  UPDATE 11/29/11: Has not had any seizure episodes. Tolerating LEV 1000mg  BID, tolerating well without mood changes or increased dizziness. No complaints today. He has been driving when he absolutely has no other way.   UPDATE 08/29/11: He has had 2 seizures on 08/11/11. Were similar to his previous seizures. Denies bowel/bladder incontinence. Denies sleep changes, missed doses, fever or poor food/fluid intake. He was increased to 1000mg  BID, tolerating well without dizziness or mood changes.   UPDATE 10/18/10: Had another spell on 10/13/10. Woke up normally, read paper. Then around 9am, was found by girlfried, slumped against wall in bedroom, with blood from mouth. Taken to ER by EMS. He was somewhat combative and confused for 2-3 hours. CT head and neck unremarkable. Started on LEV 500mg  BID. No further events. Family reports poor sleep patterns, snoring and daytime sleepiness. Sleeps 5 hrs per night, tossing and turning. Able to fall asleep very easily.  UPDATE 06/30/10: No further events. Last spell 03/15/10. Not driving. Feels good. Testing reviewed.  PRIOR HPI: 85 yo right-handed Caucasian male with a past medical history of HTN, hyperlipidemia, post-polio syndrome, vitamin D deficiency, osteoporosis with history of compression fracture, left inguinal hernia, and eczema who presents for evaluation for possible atypical seizures. He is accompanied today by his long-term girlfriend who helps provide details regarding these episodes. Starting about 2-3 years ago and occurring about every 6 months, the  patient has had spells where he will "blank out" for about 20-30 minutes. He reports complete amnesia during the spells, but denies any  other symptoms occurring during or after the episodes. Other than a possible "warning surge" in his head immediately prior to the onset of the episodes, he denies any symptoms preceding the episodes. He has a hard time describing what the "warning surge" is. His girlfriend has noticed that during the episodes he has a "blank stare," "his face gets really white," and he will ask many questions over and over such as: "Where are we?" and "What are we doing here?" She denies any other signs, including loss of postural control, loss of bowel or bladder continence, orofacial twitching, tongue biting, etc. Immediately following the episodes, the patient is back to his normal self. 08/2008 to 09/2008: MRI brain w/ and w/out, MRA w/out, carotid dopplers, EEG, and neurocognitive battery all unrevealing. 10-hour ambulatory EEG on 12/2009 which was negative. Most recent episode: March 15, 2010 around 12:30 PM, and was different from previous four episodes. The girlfriend found him "slumped down" in a chair with his legs and arms "as stiff as a board" (arms flexed at elbow, legs flexed at knees, eyes closed). She was able to rouse him after about 3-4 minutes, and he reports being "as weak as water" afterward, as well as very tired, which led him to sleep for the rest of the day.   REVIEW OF SYSTEMS: Full 14 system review of systems performed and negative with exception of: jas per HPI.   ALLERGIES: No Known Allergies  HOME MEDICATIONS: Outpatient Medications Prior to Visit  Medication Sig Dispense Refill   Ascorbic Acid (VITAMIN C) 1000 MG tablet Take 1,000 mg by mouth daily.     carbamazepine (CARBATROL) 100 MG 12 hr capsule Take 1 capsule (100 mg total) by mouth 2 (two) times daily. 60 capsule 12   Cholecalciferol (VITAMIN D-3) 1000 UNITS CAPS Take 1,000 Units by mouth daily.      levETIRAcetam (KEPPRA) 750 MG tablet Take 2 tablets (1,500 mg total) by mouth 2 (two) times daily. 360 tablet 4    lisinopril-hydrochlorothiazide (PRINZIDE,ZESTORETIC) 20-25 MG tablet Take 1 tablet by mouth daily.   0   Magnesium 250 MG TABS Take 250 mg by mouth daily.     Polyethyl Glycol-Propyl Glycol (LUBRICANT EYE DROPS) 0.4-0.3 % SOLN Place 1 drop into both eyes 3 (three) times daily as needed (for dry/irritated eyes.).     triamcinolone ointment (KENALOG) 0.1 % APPLY OINTMENT TOPICALLY TWICE DAILY AS NEEDED FOR FLARES     vitamin B-12 (CYANOCOBALAMIN) 1000 MCG tablet Take 1,000 mcg by mouth daily.     vitamin E 400 UNIT capsule Take 400 Units by mouth daily.      aspirin EC 325 MG EC tablet Take 1 tablet (325 mg total) by mouth 2 (two) times daily. 30 tablet 0   Melatonin 5 MG CAPS Take 5 mg by mouth at bedtime.     methocarbamol (ROBAXIN) 500 MG tablet Take 1 tablet (500 mg total) by mouth every 6 (six) hours as needed for muscle spasms. 40 tablet 0   No facility-administered medications prior to visit.     PAST MEDICAL HISTORY: Past Medical History:  Diagnosis Date   Anemia    STARTED PILLS 1 MONTH AGO NEW DX   Aortic insufficiency    mild, moderate   Arthritis    FINGERS SWELL PT THINKS ARTHITIS, TOP OF WRIST ALSO SWELLS ONCE IN A WHILE  Bleeds easily ([place])    BRUISE EASY ALSO   Chronic kidney disease    Cough    LAST 3 TO 4 WEEKS PHLEGM YELLOW INTERMITTENT COUGH NO FEVER   Dyslipidemia    H/O dizziness    OCC    H/O Doppler ultrasound 2010   H/O echocardiogram 2013   aortic valve disorder, EF =>55%   H/O echocardiogram 2012   AVD, EF =>55%   Heart murmur    History of hiatal hernia    HTN (hypertension)    Inguinal hernia    Mitral regurgitation    mild   Pneumonia FEW YEARS AGO   X 1   Post-polio syndrome age 89   Seizures ([place])    last sz 2016 NO CAUSE FOUND, 3 seizures on 03/25/18    Swelling of both lower extremities    DOES NOT ALWAYS DO DOWN    Tricuspid regurgitation    mild    PAST SURGICAL HISTORY: Past Surgical History:    Procedure Laterality Date   COLONSCOPY  5 YRS AGO   DENTAL SURGERY  3-4 WEEKS AGO   FEMORAL HERNIA REPAIR Right 08/17/2017   Procedure: HERNIA REPAIR FEMORAL LAPAROSCOPIC;  Surgeon: Michael Boston, MD;  Location: [facility];  Service: General;  Laterality: Right;   INGUINAL HERNIA REPAIR Bilateral 08/17/2017   Procedure: LAPAROSCOPIC BILATERAL INGUINAL HERNIA REPAIR;  Surgeon: Michael Boston, MD;  Location: [facility];  Service: General;  Laterality: Bilateral;   INSERTION OF MESH Bilateral 08/17/2017   Procedure: INSERTION OF MESH;  Surgeon: Michael Boston, MD;  Location: [facility];  Service: General;  Laterality: Bilateral;   TOTAL HIP ARTHROPLASTY Left 02/13/2018   Procedure: HEMI  ARTHROPLASTY;  Surgeon: Paralee Cancel, MD;  Location: [facility];  Service: Orthopedics;  Laterality: Left;    FAMILY HISTORY: Family History  Problem Relation Age of Onset   Cancer Mother    Heart disease Father    Cancer Maternal Grandmother    Cancer Maternal Grandfather    Kidney failure Sister     SOCIAL HISTORY:  Social History   Socioeconomic History   Marital status: Unknown    Spouse name: Not on file   Number of children: 2   Years of education: BA [employer]   Highest education level: Not on file  Occupational History   Occupation: Retired  Scientist, product/process development strain: Not on file   Food insecurity    Worry: Not on file    Inability: Not on Lexicographer needs    Medical: Not on file    Non-medical: Not on file  Tobacco Use   Smoking status: Former Smoker    Packs/day: 0.50    Years: 15.00    Pack years: 7.50    Quit date: 03/01/1963    Years since quitting: 55.6   Smokeless tobacco: Never Used  Substance and Sexual Activity   Alcohol use: No   Drug use: No   Sexual activity: Not on file  Lifestyle   Physical activity    Days per week: Not on file    Minutes per session: Not on file   Stress: Not on file  Relationships   Social connections     Talks on phone: Not on file    Gets together: Not on file    Attends religious service: Not on file    Active member of club or organization: Not on file    Attends meetings of clubs or organizations: Not on  file    Relationship status: Not on file   Intimate partner violence    Fear of current or ex partner: Not on file    Emotionally abused: Not on file    Physically abused: Not on file    Forced sexual activity: Not on file  Other Topics Concern   Not on file  Social History Narrative   85 year old right-handed male who lives at home alone.  He is single (has long-term girlfriend) and has two children.    He drinks one cup caffeinated coffee daily.     PHYSICAL EXAM  GENERAL EXAM/CONSTITUTIONAL: Vitals:  Vitals:   10/08/18 1524  BP: 100/64  Pulse: 68  Temp: 98.4 F (36.9 C)  Weight: 126 lb 9.6 oz (57.4 kg)  Height: 5\' 6"  (1.676 m)   Body mass index is 20.43 kg/m. No exam data present  Patient is in no distress; well developed, nourished and groomed; neck is supple  CARDIOVASCULAR:  Examination of carotid arteries is normal; no carotid bruits  Regular rate and rhythm, no murmurs  Examination of peripheral vascular system by observation and palpation is NOTABLE FOR DUSKY FEET AND VENOUS STASIS BILATERALLY  EYES:  Ophthalmoscopic exam of optic discs and posterior segments is normal; no papilledema or hemorrhages  MUSCULOSKELETAL:  Gait, strength, tone, movements noted in Neurologic exam below  NEUROLOGIC: MENTAL STATUS:  No flowsheet data found.  awake, alert, oriented to person, place and time  recent and remote memory intact  normal attention and concentration  language fluent, comprehension intact, naming intact,   fund of knowledge appropriate  CRANIAL NERVE:   2nd - no papilledema on fundoscopic exam  2nd, 3rd, 4th, 6th - pupils equal and reactive to light, visual fields full to confrontation, extraocular muscles intact, no nystagmus;  MILD LEFT PTOSIS (POST-SURGICAL)  5th - facial sensation symmetric  7th - facial strength symmetric  8th - hearing intact  9th - palate elevates symmetrically, uvula midline  11th - shoulder shrug symmetric  12th - tongue protrusion midline  MOTOR:   normal bulk and tone, full strength in the BUE, BLE; EXCEPT FOR BILATERAL FOOT DROP (RIGHT 3, LEFT 2-3)  LEFT HIP FLEXION 2-3  SENSORY:   normal and symmetric to light touch, temperature, vibration  COORDINATION:   finger-nose-finger, fine finger movements normal  REFLEXES:   deep tendon reflexes --> BUE 1; BLE 0  GAIT/STATION:   narrow based gait; USES WALKER; SLIGHTLY UNSTEADY; CANNOT WALK ON HEELS OR TOES (FOOT DROP RELATED TO POLIO SYNDROME)    DIAGNOSTIC DATA (LABS, IMAGING, TESTING) - I reviewed patient records, labs, notes, testing and imaging myself where available.  Lab Results  Component Value Date   WBC 12.5 (H) 02/18/2018   HGB 10.5 (L) 02/18/2018   HCT 33.1 (L) 02/18/2018   MCV 92.5 02/18/2018   PLT 295 02/18/2018      Component Value Date/Time   NA 134 (L) 02/19/2018 1508   K 3.3 (L) 02/19/2018 1508   CL 96 (L) 02/19/2018 1508   CO2 29 02/19/2018 1508   GLUCOSE 147 (H) 02/19/2018 1508   BUN 31 (H) 02/19/2018 1508   CREATININE 1.36 (H) 02/19/2018 1508   CREATININE 2.11 (H) 02/16/2016 1144   CALCIUM 7.8 (L) 02/19/2018 1508   PROT 5.3 (L) 02/18/2018 0523   ALBUMIN 2.4 (L) 02/18/2018 0523   AST 18 02/18/2018 0523   ALT 12 02/18/2018 0523   ALKPHOS 81 02/18/2018 0523   BILITOT 1.1 02/18/2018 0523  GFRNONAA 46 (L) 02/19/2018 1508   GFRAA 53 (L) 02/19/2018 1508   No results found for: CHOL, HDL, LDLCALC, LDLDIRECT, TRIG, CHOLHDL No results found for: HGBA1C Lab Results  Component Value Date   [id number] 725 02/15/2018   No results found for: TSH  10/17/14 lipid panel: CHOL 200, TRIG 91, HDL 65, LDL 117  05/04/10 EEG - normal  05/11/10 MRI BRAIN  - mild chronic small vessel ischemic  disease.  03/04/16 TTE - Left ventricle: The cavity size was normal. Wall thickness was   normal. Systolic function was normal. The estimated ejection   fraction was in the range of 60% to 65%. Wall motion was normal;   there were no regional wall motion abnormalities. Features are   consistent with a pseudonormal left ventricular filling pattern,   with concomitant abnormal relaxation and increased filling   pressure (grade 2 diastolic dysfunction). - Aortic valve: Moderately calcified annulus. Moderately thickened,   moderately calcified leaflets. There was moderate regurgitation. - Mitral valve: There was mild regurgitation. - Pulmonary arteries: Systolic pressure was moderately increased.   PA peak pressure: 44 mm Hg (S).  04/08/16 EKG [I reviewed images myself and agree with interpretation. -VRP]  - normal sinus rhythm   05/20/16 carotid u/s - Minimal to moderate amount of bilateral atherosclerotic plaque, left greater than right, unchanged to minimally progressed compared to the 08/2008 examination though again not resulting in elevated peak systolic velocities within either internal carotid artery.  05/23/16 MRI brain (without) [I reviewed images myself and agree with interpretation. -VRP] 1. Mild perisylvian and temporal atrophy.  2. Mild periventricular and subcortical chronic small vessel ischemic disease.  3. No acute findings. 4. Compared to MRI on 09/18/08, there has been progression of atrophy and chronic small vessel ischemic disease.       ASSESSMENT AND PLAN  85 y.o. 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 Last seizure was in 12/09/14.   Also with possible TIA on week of 05/02/16, right face drooping, right hand tingling, and slurred speech.  Last seizure 03/25/18.   Dx: TIA vs seizure  No diagnosis found.   PLAN:  SEIZURE DISORDER (established problem, improved)  - continue  levetiracetam 1500mg  twice a day  - continue carbamazepine 100mg  twice a day   - According to [place] law, you can not drive unless you are seizure / syncope free for at least 6 months and under physician's care.   - Please maintain precautions. Do not participate in activities where a loss of awareness could harm you or someone else. No swimming alone, no tub bathing, no hot tubs, no driving, no operating motorized vehicles (cars, ATVs, motocycles, etc), lawnmowers, power tools or firearms. No standing at heights, such as rooftops, ladders or stairs. Avoid hot objects such as stoves, heaters, open fires. Wear a helmet when riding a bicycle, scooter, skateboard, etc. and avoid areas of traffic. Set your water heater to 120 degrees or less.  TIA / stroke prevention (established problem, stable) - follow up with PCP re: BP, lipid and diabetes screening and mgmt - aspirin was stopped by PCP in March 2019 (? Age ot bleeding risk)  Meds ordered this encounter  Medications   levETIRAcetam (KEPPRA) 750 MG tablet    Sig: Take 2 tablets (1,500 mg total) by mouth 2 (two) times daily.    Dispense:  360 tablet    Refill:  4   carbamazepine (CARBATROL) 100 MG 12  hr capsule    Sig: Take 1 capsule (100 mg total) by mouth 2 (two) times daily.    Dispense:  180 capsule    Refill:  4   Return in about 1 year (around 10/08/2019) for with NP (Amy Lomax).     Penni Bombard, MD 09/29/6551, 7:48 PM Certified in Neurology, Neurophysiology and Neuroimaging  [facility] [address] [place], [place] [address] [phone]

## 2018-11-15 ENCOUNTER — Other Ambulatory Visit: Payer: Self-pay

## 2018-11-15 ENCOUNTER — Ambulatory Visit (INDEPENDENT_AMBULATORY_CARE_PROVIDER_SITE_OTHER): Payer: Medicare Other

## 2018-11-15 ENCOUNTER — Ambulatory Visit (INDEPENDENT_AMBULATORY_CARE_PROVIDER_SITE_OTHER): Payer: Medicare Other | Admitting: Podiatry

## 2018-11-15 DIAGNOSIS — M79671 Pain in right foot: Secondary | ICD-10-CM

## 2018-11-15 DIAGNOSIS — L89899 Pressure ulcer of other site, unspecified stage: Secondary | ICD-10-CM | POA: Diagnosis not present

## 2018-11-15 DIAGNOSIS — M9261 Juvenile osteochondrosis of tarsus, right ankle: Secondary | ICD-10-CM

## 2018-11-15 DIAGNOSIS — M7731 Calcaneal spur, right foot: Secondary | ICD-10-CM | POA: Diagnosis not present

## 2018-11-15 DIAGNOSIS — L89620 Pressure ulcer of left heel, unstageable: Secondary | ICD-10-CM

## 2018-11-16 ENCOUNTER — Other Ambulatory Visit: Payer: Self-pay | Admitting: Podiatry

## 2018-11-16 DIAGNOSIS — M7731 Calcaneal spur, right foot: Secondary | ICD-10-CM

## 2018-11-21 ENCOUNTER — Other Ambulatory Visit: Payer: Self-pay

## 2018-11-21 ENCOUNTER — Ambulatory Visit (INDEPENDENT_AMBULATORY_CARE_PROVIDER_SITE_OTHER): Payer: Medicare Other | Admitting: Cardiovascular Disease

## 2018-11-21 VITALS — BP 128/60 | HR 66 | Temp 97.9°F | Ht 66.0 in | Wt 130.0 lb

## 2018-11-21 DIAGNOSIS — I10 Essential (primary) hypertension: Secondary | ICD-10-CM

## 2018-11-21 DIAGNOSIS — I351 Nonrheumatic aortic (valve) insufficiency: Secondary | ICD-10-CM | POA: Diagnosis not present

## 2018-11-21 DIAGNOSIS — I519 Heart disease, unspecified: Secondary | ICD-10-CM

## 2018-11-21 DIAGNOSIS — N183 Chronic kidney disease, stage 3 unspecified: Secondary | ICD-10-CM

## 2018-11-21 DIAGNOSIS — I739 Peripheral vascular disease, unspecified: Secondary | ICD-10-CM

## 2018-11-21 DIAGNOSIS — I7121 Aneurysm of the ascending aorta, without rupture: Secondary | ICD-10-CM

## 2018-11-21 DIAGNOSIS — E785 Hyperlipidemia, unspecified: Secondary | ICD-10-CM

## 2018-11-21 DIAGNOSIS — I712 Thoracic aortic aneurysm, without rupture: Secondary | ICD-10-CM

## 2018-11-21 MED ORDER — ASPIRIN EC 81 MG PO TBEC: 81.0000 mg | DELAYED_RELEASE_TABLET | ORAL | 3 refills | Status: DC

## 2018-11-21 NOTE — Progress Notes (Signed)
Patient ID: Charles Saldierna., male   DOB: 27-Jun-1927, 85 y.o.   MRN: [id number]    Cardiology Office Note    Date:  11/22/2018   ID:  Charles Big., DOB [birth year], MRN [id number]  PCP:  Nickola Major, MD  Cardiologist:   Sanda Klein, MD   Chief Complaint  Patient presents with  . PAD  . Thoracic Aortic Aneurysm  . Cardiac Valve Problem    History of Present Illness:  Charles Bobick. is a 85 y.o. male with long-standing systemic hypertension, normal left ventricular systolic function and moderate aortic insufficiency related to moderate size ascending aortic aneurysm, PAD. He had syncope in the past, identified after workup to be due to seizure. He has substantial asymmetry of his lower extremities due to remote polio syndrome.   He was hospitalized for a left hip fracture in December 2019 and underwent hemiarthroplasty.  He is recovered reasonably good functional status, although he is not back to his previous baseline.  He has generally done well from a cardiovascular standpoint and denies angina or dyspnea at rest or with light activity.  He does not have orthopnea or PND and has not been troubled by palpitations or syncope.  He has chronic purplish discoloration of his left knee which also feels cold to touch.  It does not sound like he is describing intermittent claudication, but vascular ultrasonography performed November 2019 shows symmetrical moderately severe reduction in flow to both lower extremities left ABI 0.63, right ABI of 0.65.  He has had previous problems with slowly healing ulcers on both ankles, but none recently.  His most recent echocardiogram in December 2019 shows normal left ventricular size and systolic function, aortic insufficiency is described as mild (in the past was described as moderate), the visualized portion of the ascending aorta measures 4.6 cm.  By previous CT angiogram of the chest in 2017 the maximum aortic diameter was 4.8 cm.  Declined  routine imaging follow-up since he is not planning to ever have aneurysm surgery.  He remains quite sedentary and gets around with a walker.  Overall though he enjoys good quality of life.  Past Medical History:  Diagnosis Date  . Anemia    STARTED PILLS 1 MONTH AGO NEW DX  . Aortic insufficiency    mild, moderate  . Arthritis    FINGERS SWELL PT THINKS ARTHITIS, TOP OF WRIST ALSO SWELLS ONCE IN A WHILE  . Bleeds easily ([place])    BRUISE EASY ALSO  . Chronic kidney disease   . Cough    LAST 3 TO 4 WEEKS PHLEGM YELLOW INTERMITTENT COUGH NO FEVER  . Dyslipidemia   . H/O dizziness    OCC   . H/O Doppler ultrasound 2010  . H/O echocardiogram 2013   aortic valve disorder, EF =>55%  . H/O echocardiogram 2012   AVD, EF =>55%  . Heart murmur   . History of hiatal hernia   . HTN (hypertension)   . Inguinal hernia   . Mitral regurgitation    mild  . Pneumonia FEW YEARS AGO   X 1  . Post-polio syndrome age 76  . Seizures ([place])    last sz 2016 NO CAUSE FOUND, 3 seizures on 03/25/18   . Swelling of both lower extremities    DOES NOT ALWAYS DO DOWN   . Tricuspid regurgitation    mild    Past Surgical History:  Procedure Laterality Date  . COLONSCOPY  5 YRS AGO  .  DENTAL SURGERY  3-4 WEEKS AGO  . FEMORAL HERNIA REPAIR Right 08/17/2017   Procedure: HERNIA REPAIR FEMORAL LAPAROSCOPIC;  Surgeon: Michael Boston, MD;  Location: [facility];  Service: General;  Laterality: Right;  . INGUINAL HERNIA REPAIR Bilateral 08/17/2017   Procedure: LAPAROSCOPIC BILATERAL INGUINAL HERNIA REPAIR;  Surgeon: Michael Boston, MD;  Location: [facility];  Service: General;  Laterality: Bilateral;  . INSERTION OF MESH Bilateral 08/17/2017   Procedure: INSERTION OF MESH;  Surgeon: Michael Boston, MD;  Location: [facility];  Service: General;  Laterality: Bilateral;  . TOTAL HIP ARTHROPLASTY Left 02/13/2018   Procedure: HEMI  ARTHROPLASTY;  Surgeon: Paralee Cancel, MD;  Location: [facility];  Service: Orthopedics;  Laterality: Left;     Outpatient Medications Prior to Visit  Medication Sig Dispense Refill  . Ascorbic Acid (VITAMIN C) 1000 MG tablet Take 1,000 mg by mouth daily.    . carbamazepine (CARBATROL) 100 MG 12 hr capsule Take 1 capsule (100 mg total) by mouth 2 (two) times daily. 180 capsule 4  . Cholecalciferol (VITAMIN D-3) 1000 UNITS CAPS Take 1,000 Units by mouth daily.     . imiquimod (ALDARA) 5 % cream APPLY ON THE SKIN AS DIRECTED EVERY OTHER NIGHT FOR 4 WEEKS    . levETIRAcetam (KEPPRA) 750 MG tablet Take 2 tablets (1,500 mg total) by mouth 2 (two) times daily. 360 tablet 4  . lisinopril-hydrochlorothiazide (PRINZIDE,ZESTORETIC) 20-25 MG tablet Take 1 tablet by mouth daily.   0  . Magnesium 250 MG TABS Take 250 mg by mouth daily.    Vladimir Faster Glycol-Propyl Glycol (LUBRICANT EYE DROPS) 0.4-0.3 % SOLN Place 1 drop into both eyes 3 (three) times daily as needed (for dry/irritated eyes.).    Marland Kitchen triamcinolone ointment (KENALOG) 0.1 % APPLY OINTMENT TOPICALLY TWICE DAILY AS NEEDED FOR FLARES    . vitamin B-12 (CYANOCOBALAMIN) 1000 MCG tablet Take 1,000 mcg by mouth daily.    . vitamin E 400 UNIT capsule Take 400 Units by mouth daily.      No facility-administered medications prior to visit.      Allergies:   Patient has no known allergies.   Social History   Socioeconomic History  . Marital status: Unknown    Spouse name: Not on file  . Number of children: 2  . Years of education: BA [employer]  . Highest education level: Not on file  Occupational History  . Occupation: Retired  Scientific laboratory technician  . Financial resource strain: Not on file  . Food insecurity    Worry: Not on file    Inability: Not on file  . Transportation needs    Medical: Not on file    Non-medical: Not on file  Tobacco Use  . Smoking status: Former Smoker    Packs/day: 0.50    Years: 15.00    Pack years: 7.50    Quit date: 03/01/1963    Years since quitting: 55.7  . Smokeless tobacco: Never Used  Substance and Sexual Activity  .  Alcohol use: No  . Drug use: No  . Sexual activity: Not on file  Lifestyle  . Physical activity    Days per week: Not on file    Minutes per session: Not on file  . Stress: Not on file  Relationships  . Social Herbalist on phone: Not on file    Gets together: Not on file    Attends religious service: Not on file    Active member of club or organization: Not  on file    Attends meetings of clubs or organizations: Not on file    Relationship status: Not on file  Other Topics Concern  . Not on file  Social History Narrative   85 year old right-handed male who lives at home alone.  He is single (has long-term girlfriend) and has two children.    He drinks one cup caffeinated coffee daily.     Family History:  The patient's family history includes Cancer in his maternal grandfather, maternal grandmother, and mother; Heart disease in his father; Kidney failure in his sister.   ROS:   Please see the history of present illness.    ROS All other systems are reviewed and are negative  PHYSICAL EXAM:   VS:  BP 128/60   Pulse 66   Temp 97.9 F (36.6 C) (Temporal)   Ht 5\' 6"  (1.676 m)   Wt 130 lb (59 kg)   SpO2 95%   BMI 20.98 kg/m      General: Alert, oriented x3, no distress, very lean Head: no evidence of trauma, PERRL, EOMI, no exophtalmos or lid lag, no myxedema, no xanthelasma; normal ears, nose and oropharynx Neck: normal jugular venous pulsations and no hepatojugular reflux; brisk carotid pulses without delay and no carotid bruits Chest: clear to auscultation, no signs of consolidation by percussion or palpation, normal fremitus, symmetrical and full respiratory excursions Cardiovascular: normal position and quality of the apical impulse, regular rhythm, normal first and second heart sounds, 2/6 early peaking systolic ejection murmur at the right upper sternal border, 1/6 decrescendo diastolic murmur at the left lower sternal border, no rubs or gallops Abdomen: no  tenderness or distention, no masses by palpation, no abnormal pulsatility or arterial bruits, normal bowel sounds, no hepatosplenomegaly Extremities: no clubbing, cyanosis or edema; 2+ radial, ulnar and brachial pulses bilaterally; 2+ right femoral and left femoral, unable to palpate posterior tibial and dorsalis pedis pulses on either side. Neurological: grossly nonfocal Psych: Normal mood and affect t  Wt Readings from Last 3 Encounters:  11/21/18 130 lb (59 kg)  10/08/18 126 lb 9.6 oz (57.4 kg)  04/04/18 140 lb 14.4 oz (63.9 kg)    Studies/Labs Reviewed:   ECHO: October 23, 2017 - Left ventricle: The cavity size was normal. Systolic function was normal. The estimated ejection fraction was in the range of 60% to 65%. Wall motion was normal; there were no regional wall motion abnormalities. Doppler parameters are consistent with abnormal left ventricular relaxation (grade 1 diastolic dysfunction). Doppler parameters are consistent with indeterminate ventricular filling pressure. - Aortic valve: Valve mobility was restricted. Transvalvular velocity was within the normal range. There was no stenosis.   There was moderate regurgitation. Peak velocity (S): 205 cm/s. Mean gradient (S): 8 mm Hg. Regurgitation pressure half-time: 451 ms. - Aorta: Ascending aortic diameter: 46 mm (S). - Ascending aorta: The ascending aorta was moderately dilated. - Mitral valve: Transvalvular velocity was within the normal range. There was no evidence for stenosis. There was mild regurgitation. - Left atrium: The atrium was mildly dilated. - Right ventricle: The cavity size was mildly dilated. Wall thickness was normal. Systolic function was normal. - Right atrium: The atrium was mildly dilated. - Atrial septum: No defect or patent foramen ovale was identified. - Pulmonary arteries: Systolic pressure was within the normal range. PA peak pressure: 32 mm Hg (S).  Echo 02/16/2018: - Left ventricle: The cavity size  was normal. Wall thickness was   normal. Systolic function was normal. The  estimated ejection   fraction was in the range of 50% to 55%. Wall motion was normal;   there were no regional wall motion abnormalities. Doppler   parameters are consistent with abnormal left ventricular   relaxation (grade 1 diastolic dysfunction). Doppler parameters   are consistent with high ventricular filling pressure. - Aortic valve: There was mild regurgitation. - Ascending aorta: The ascending aorta was mildly dilated. - Mitral valve: Calcified annulus. - Pulmonary arteries: Systolic pressure was moderately increased.   PA peak pressure: 54 mm Hg (S).  Impressions:  - Normal LV function; mild diastolic dysfunction; sclerotic aortic   valve with mild AI; mildly dilated ascending aorta; mild TR with   moderate pulmonary hypertension.  EKG:  EKG is ordered today.  It shows sinus rhythm with mild first-degree AV block (PR 218 ms) and is otherwise normal.  QTc 410 ms  BMET    Component Value Date/Time   NA 134 (L) 02/19/2018 1508   K 3.3 (L) 02/19/2018 1508   CL 96 (L) 02/19/2018 1508   CO2 29 02/19/2018 1508   GLUCOSE 147 (H) 02/19/2018 1508   BUN 31 (H) 02/19/2018 1508   CREATININE 1.36 (H) 02/19/2018 1508   CREATININE 2.11 (H) 02/16/2016 1144   CALCIUM 7.8 (L) 02/19/2018 1508   GFRNONAA 46 (L) 02/19/2018 1508   GFRAA 53 (L) 02/19/2018 1508     ASSESSMENT:    1. Ascending aortic aneurysm ([place])   2. Nonrheumatic aortic (valve) insufficiency   3. Essential hypertension   4. Left ventricular diastolic dysfunction   5. CKD (chronic kidney disease), stage III ([place])   6. PAD (peripheral artery disease) ([place])   7. Hyperlipidemia, unspecified hyperlipidemia type      PLAN:  In order of problems listed above:  1. 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  Suspected was underestimated on the most recent echo.  Probably moderate, no plan for repair.. 2. Asc Ao aneurysm: He declines ever  undergoing surgical repair so routine imaging is not planned. 3. HTN: Well-controlled.  Try to keep around 120/60 to avoid aneurysm expansion and worsening aortic insufficiency. 4. LV diastolic dysfunction: This was described on echocardiography, but he does not have overt heart failure and does not require loop diuretics. 5. Stage III chronic kidney disease: After transient deterioration in renal function, seems to be back to baseline creatinine of around 1.3-1.5, GFR around 40. 6. PAD: Not sure why the aspirin was stopped but in view of evidence of substantial PAD, I think he should restart it.  He has not had any serious bleeding problems. 7. HLP: check lipids.  While is difficult to establish the benefit of lipid-lowering therapy in a gentleman his age, I am worried about the risk of lower limb ischemia.    Medication Adjustments/Labs and Tests Ordered: Current medicines are reviewed at length with the patient today.  Concerns regarding medicines are outlined above.  Medication changes, Labs and Tests ordered today are listed in the Patient Instructions below. Patient Instructions  Medication Instructions:  START Aspirin 81 mg every other day.   If you need a refill on your cardiac medications before your next appointment, please call your pharmacy.   Lab work: Your provider would like for you to have a fasting Lipid at your convenience   If you have labs (blood work) drawn today and your tests are completely normal, you will receive your results only by: [place] (if you have MyChart) OR A paper copy in the mail If you  have any lab test that is abnormal or we need to change your treatment, we will call you to review the results.  Testing/Procedures: None ordered  Follow-Up: At [facility], you and your health needs are our priority.  As part of our continuing mission to provide you with exceptional heart care, we have created designated Provider Care Teams.  These Care  Teams include your primary Cardiologist (physician) and Advanced Practice Providers (APPs -  Physician Assistants and Nurse Practitioners) who all work together to provide you with the care you need, when you need it. You will need a follow up appointment in 12 months.  Please call our office 2 months in advance to schedule this appointment.  You may see Sanda Klein, MD or one of the following Advanced Practice Providers on your designated Care Team: Almyra Deforest, PA-C Fabian Sharp, [place]         Signed, Sanda Klein, MD  11/22/2018 1:33 PM    [facility] [place], [place],   [address] Phone: [phone]; Fax: [phone]

## 2018-11-21 NOTE — Patient Instructions (Addendum)
Medication Instructions:  START Aspirin 81 mg every other day.   If you need a refill on your cardiac medications before your next appointment, please call your pharmacy.   Lab work: Your provider would like for you to have a fasting Lipid at your convenience   If you have labs (blood work) drawn today and your tests are completely normal, you will receive your results only by: [place] (if you have MyChart) OR A paper copy in the mail If you have any lab test that is abnormal or we need to change your treatment, we will call you to review the results.  Testing/Procedures: None ordered  Follow-Up: At [facility], you and your health needs are our priority.  As part of our continuing mission to provide you with exceptional heart care, we have created designated Provider Care Teams.  These Care Teams include your primary Cardiologist (physician) and Advanced Practice Providers (APPs -  Physician Assistants and Nurse Practitioners) who all work together to provide you with the care you need, when you need it. You will need a follow up appointment in 12 months.  Please call our office 2 months in advance to schedule this appointment.  You may see Sanda Klein, MD or one of the following Advanced Practice Providers on your designated Care Team: Almyra Deforest, PA-C Fabian Sharp, [place]

## 2018-11-22 ENCOUNTER — Encounter: Payer: Self-pay | Admitting: Cardiovascular Disease

## 2018-11-26 ENCOUNTER — Telehealth: Payer: Self-pay | Admitting: Cardiovascular Disease

## 2018-11-26 DIAGNOSIS — E785 Hyperlipidemia, unspecified: Secondary | ICD-10-CM

## 2018-11-26 MED ORDER — ATORVASTATIN CALCIUM 20 MG PO TABS: 20.0000 mg | ORAL_TABLET | Freq: Every day | ORAL | 5 refills | Status: DC

## 2018-11-26 NOTE — Telephone Encounter (Signed)
The patient has been made aware and is agreeable to start atorvastatin 20 mg once daily. Lab orders have been mailed to him to be completed in 3 months.

## 2018-11-26 NOTE — Telephone Encounter (Signed)
From Care Everywhere [facility]) 10/10/2018:  LDL Direct 124 <130 mg/dL [facility]   Total Cholesterol 193 25 - 199 MG/DL [facility]   Triglycerides 88 10 - 150 MG/DL [facility]   HDL Cholesterol 57 35 - 135 MG/DL [facility]   Total Chol / HDL Cholesterol 3.4 <4.5 [facility]   Non-HDL Cholesterol 136Comment:  TARGET: <(LDL-C TARGET + 30)MG/DL MG/DL [facility]

## 2018-11-26 NOTE — Telephone Encounter (Signed)
His age duly acknowledged, I still believe he should be on a statin, especially due to the problems with poor circulation in his legs. Recommend atorvastatin 20 mg daily and recheck labs (lipid panel only) in approx 3 months.

## 2018-11-26 NOTE — Telephone Encounter (Signed)
  Ms Kenton Kingfisher is calling because the patient had lab work done at PCP on 09/30/18. She states that they did a lipid on him. Can call to request lab work at [place].

## 2018-11-27 ENCOUNTER — Telehealth: Payer: Self-pay | Admitting: Cardiovascular Disease

## 2018-11-27 MED ORDER — EZETIMIBE 10 MG PO TABS: 10.0000 mg | ORAL_TABLET | Freq: Every day | ORAL | 11 refills | Status: DC

## 2018-11-27 NOTE — Telephone Encounter (Signed)
Spoke with the patient's girlfriend per the dpr. The patient had not started the Atorvastatin that was prescribed yesterday. They had been reading that the Statins can cause muscle pain and at the age of 62 he is afraid to get more pain so they are leery of starting a statin.  They were both agreeable to starting the Zetia 10 mg once daily with the understanding that it is not as effective. They have been advised to please get the lab work completed in 3 months to see recheck his cholesterol. Lab orders were mailed.

## 2018-11-27 NOTE — Telephone Encounter (Signed)
New Message:    Pt wants to know if Dr C can prescribe something else for his Cholesterol that is not a statin. The statinscause him to have pains in his legs.

## 2018-11-27 NOTE — Telephone Encounter (Signed)
Patient was just changed over to atorvastatin- but continues to have pains in legs. He would like something else? Will route to MD and primary.

## 2018-11-27 NOTE — Telephone Encounter (Signed)
I'm confused. Does he mean he expects to get muscle pains (as maybe happened in the past) or is he actually blaming the pain he has now on the prescription we just recommended yesterday? If he is worried about statin myalgia, I would try rosuvastatin 10 mg twice weekly with CoQ10 300 mg daily. If he is opposed to statins 100%, try ezetimibe 10 mg daily, but will not be as effective.

## 2018-12-12 NOTE — Progress Notes (Signed)
Subjective:  Patient ID: Charles Big., male    DOB: 11-May-1927,  MRN: [id number]  Chief Complaint  Patient presents with  . Ankle Pain    Pt states ankle pain has not resolved, heel pillows did not help. Pt states pain is deep inside his ankle, lateral aspect.    85 y.o. male presents with the above complaint. History above confirmed with patient.  Review of Systems: Negative except as noted in the HPI. Denies N/V/F/Ch.  Past Medical History:  Diagnosis Date  . Anemia    STARTED PILLS 1 MONTH AGO NEW DX  . Aortic insufficiency    mild, moderate  . Arthritis    FINGERS SWELL PT THINKS ARTHITIS, TOP OF WRIST ALSO SWELLS ONCE IN A WHILE  . Bleeds easily ([place])    BRUISE EASY ALSO  . Chronic kidney disease   . Cough    LAST 3 TO 4 WEEKS PHLEGM YELLOW INTERMITTENT COUGH NO FEVER  . Dyslipidemia   . H/O dizziness    OCC   . H/O Doppler ultrasound 2010  . H/O echocardiogram 2013   aortic valve disorder, EF =>55%  . H/O echocardiogram 2012   AVD, EF =>55%  . Heart murmur   . History of hiatal hernia   . HTN (hypertension)   . Inguinal hernia   . Mitral regurgitation    mild  . Pneumonia FEW YEARS AGO   X 1  . Post-polio syndrome age 17  . Seizures ([place])    last sz 2016 NO CAUSE FOUND, 3 seizures on 03/25/18   . Swelling of both lower extremities    DOES NOT ALWAYS DO DOWN   . Tricuspid regurgitation    mild    Current Outpatient Medications:  .  Ascorbic Acid (VITAMIN C) 1000 MG tablet, Take 1,000 mg by mouth daily., Disp: , Rfl:  .  carbamazepine (CARBATROL) 100 MG 12 hr capsule, Take 1 capsule (100 mg total) by mouth 2 (two) times daily., Disp: 180 capsule, Rfl: 4 .  Cholecalciferol (VITAMIN D-3) 1000 UNITS CAPS, Take 1,000 Units by mouth daily. , Disp: , Rfl:  .  imiquimod (ALDARA) 5 % cream, APPLY ON THE SKIN AS DIRECTED EVERY OTHER NIGHT FOR 4 WEEKS, Disp: , Rfl:  .  levETIRAcetam (KEPPRA) 750 MG tablet, Take 2 tablets (1,500 mg total) by mouth 2 (two) times  daily., Disp: 360 tablet, Rfl: 4 .  lisinopril-hydrochlorothiazide (PRINZIDE,ZESTORETIC) 20-25 MG tablet, Take 1 tablet by mouth daily. , Disp: , Rfl: 0 .  Magnesium 250 MG TABS, Take 250 mg by mouth daily., Disp: , Rfl:  .  Polyethyl Glycol-Propyl Glycol (LUBRICANT EYE DROPS) 0.4-0.3 % SOLN, Place 1 drop into both eyes 3 (three) times daily as needed (for dry/irritated eyes.)., Disp: , Rfl:  .  triamcinolone ointment (KENALOG) 0.1 %, APPLY OINTMENT TOPICALLY TWICE DAILY AS NEEDED FOR FLARES, Disp: , Rfl:  .  vitamin B-12 (CYANOCOBALAMIN) 1000 MCG tablet, Take 1,000 mcg by mouth daily., Disp: , Rfl:  .  vitamin E 400 UNIT capsule, Take 400 Units by mouth daily. , Disp: , Rfl:  .  aspirin EC 81 MG tablet, Take 1 tablet (81 mg total) by mouth every other day., Disp: 90 tablet, Rfl: 3 .  ezetimibe (ZETIA) 10 MG tablet, Take 1 tablet (10 mg total) by mouth daily., Disp: 30 tablet, Rfl: 11  Social History   Tobacco Use  Smoking Status Former Smoker  . Packs/day: 0.50  . Years: 15.00  . Pack  years: 7.50  . Quit date: 03/01/1963  . Years since quitting: 55.8  Smokeless Tobacco Never Used    No Known Allergies Objective:   There were no vitals filed for this visit. There is no height or weight on file to calculate BMI. Constitutional Well developed. Well nourished.  Vascular Dorsalis pedis pulses palpable bilaterally. Posterior tibial pulses palpable bilaterally. Capillary refill normal to all digits.  No cyanosis or clubbing noted. Pedal hair growth normal.  Neurologic Normal speech. Oriented to person, place, and time. Epicritic sensation to light touch grossly present bilaterally.  Dermatologic Nails normal Painful HPK posterior heel left at the calcaneus and lateral malleolus.  Orthopedic: Normal joint ROM without pain or crepitus bilaterally. No visible deformities. No bony tenderness.   Radiographs: Taken and reviewed.  Age-related degenerative changes.  Haglund deformity  posterior spurring Assessment:   1. Haglund's deformity of right heel   2. Pressure ulcer, heel, left, unstageable ([place])   3. Pressure ulcer of right leg    Plan:  Patient was evaluated and treated and all questions answered.  Pressure pre-ulcerative calluses. -X-rays taken discussed no significant osseous pathology other than heel spurring.  Discussed pressure reduction techniques.  Discussed with the calluses the source of his pain discussed routine care.  Educated on self-care.  Follow-up as needed.  No follow-ups on file.

## 2019-01-28 ENCOUNTER — Telehealth: Payer: Self-pay | Admitting: Cardiovascular Disease

## 2019-01-28 NOTE — Telephone Encounter (Signed)
New Message     Pt c/o medication issue:  1. Name of Medication: ezetimibe (ZETIA) 10 MG tablet  2. How are you currently taking this medication (dosage and times per day)? 10 mg 1x daily   3. Are you having a reaction (difficulty breathing--STAT)? No   4. What is your medication issue? Pt is breaking out in  Rash all over. Arms, back, chest, legs. He Is getting scabs all over. He has been using a cream that has been helping that he uses for Eczema, triamcinolone ointment (KENALOG) 0.1 %    Please call

## 2019-01-28 NOTE — Telephone Encounter (Signed)
Looks like he was just switched to [place] back in 10/2018-  Okay to stop for a few days to see if rash improves? Routing to PharmD>

## 2019-01-28 NOTE — Telephone Encounter (Signed)
Please stop ezetimibe for 2 weeks to see if things clear up.

## 2019-01-28 NOTE — Telephone Encounter (Signed)
Called girlfriend per DPR- gave message from PharmD, will call back if it goes away or not in 2 weeks.

## 2019-01-30 ENCOUNTER — Other Ambulatory Visit: Payer: Self-pay | Admitting: Cardiovascular Disease

## 2019-01-30 LAB — LIPID PANEL
Chol/HDL Ratio: 2.3 ratio (ref 0.0–5.0)
Cholesterol, Total: 176 mg/dL (ref 100–199)
HDL: 75 mg/dL (ref >39)
LDL Chol Calc (NIH): 86 mg/dL (ref 0–99)
Triglycerides: 82 mg/dL (ref 0–149)
VLDL Cholesterol Cal: 15 mg/dL (ref 5–40)

## 2019-02-13 ENCOUNTER — Telehealth: Payer: Self-pay | Admitting: Cardiovascular Disease

## 2019-02-13 MED ORDER — EZETIMIBE 10 MG PO TABS: 10.0000 mg | ORAL_TABLET | Freq: Every day | ORAL | 11 refills | Status: DC

## 2019-02-13 NOTE — Telephone Encounter (Signed)
Pt verbalized understanding of Dr. Victorino December recommendation and will call in a couple weeks or after the rash and itching has completely subsided/ different from his eczema. He will continue to hold the Zetia for now.

## 2019-02-13 NOTE — Telephone Encounter (Signed)
New Message      Pt c/o medication issue:  1. Name of Medication: ezetimibe (ZETIA) 10 MG tablet  2. How are you currently taking this medication (dosage and times per day)?  Not taking   3. Are you having a reaction (difficulty breathing--STAT)? No   4. What is your medication issue? Pt is still itching a little bit, he does have Eczema.  Tammy is wondering if he needs to go back on the medication    Please call

## 2019-02-13 NOTE — Telephone Encounter (Signed)
Taking the Zetia is by no means an emergency.  I will recommend waiting until the itching and rash have resolved completely before rechallenging.  Give it a whole month.

## 2019-02-13 NOTE — Telephone Encounter (Signed)
Pt called to report that he has been off of the Zetia for 2 weeks as of today that he was holding for an all over rash and itching.. he reports that the rash has improved but he is still itching.. he has h/o eczema but this is a little different but he says could still be the eczema.. he is asking fo he should try taking the Zetia again.Marland Kitchen will forward to the [facility] and Dr. Sallyanne Kuster for review and recommendations.

## 2019-10-09 ENCOUNTER — Encounter: Payer: Self-pay | Admitting: Family Medicine

## 2019-10-09 ENCOUNTER — Ambulatory Visit (INDEPENDENT_AMBULATORY_CARE_PROVIDER_SITE_OTHER): Payer: Medicare Other | Admitting: Family Medicine

## 2019-10-09 VITALS — BP 108/66 | HR 67 | Ht 66.0 in | Wt 137.4 lb

## 2019-10-09 DIAGNOSIS — G40209 Localization-related (focal) (partial) symptomatic epilepsy and epileptic syndromes with complex partial seizures, not intractable, without status epilepticus: Secondary | ICD-10-CM

## 2019-10-09 MED ORDER — CARBAMAZEPINE ER 100 MG PO CP12: 100.0000 mg | ORAL_CAPSULE | Freq: Two times a day (BID) | ORAL | 4 refills | Status: DC

## 2019-10-09 MED ORDER — LEVETIRACETAM 750 MG PO TABS: 1500.0000 mg | ORAL_TABLET | Freq: Two times a day (BID) | ORAL | 4 refills | Status: DC

## 2019-10-09 NOTE — Patient Instructions (Signed)
We will continue levetiracetam 1500mg  and carbamazepine 100mg  twice daily.   Stay well hydrated. Focus on healthy lifestyle habits.   Follow up in 1 year   Seizure, Adult A seizure is a sudden burst of abnormal electrical activity in the brain. Seizures usually last from 30 seconds to 2 minutes. They can cause many different symptoms. Usually, seizures are not harmful unless they last a long time. What are the causes? Common causes of this condition include:  Fever or infection.  Conditions that affect the brain, such as: ? A brain abnormality that you were born with. ? A brain or head injury. ? Bleeding in the brain. ? A tumor. ? Stroke. ? Brain disorders such as autism or cerebral palsy.  Low blood sugar.  Conditions that are passed from parent to child (are inherited).  Problems with substances, such as: ? Having a reaction to a drug or a medicine. ? Suddenly stopping the use of a substance (withdrawal). In some cases, the cause may not be known. A person who has repeated seizures over time without a clear cause has a condition called epilepsy. What increases the risk? You are more likely to get this condition if you have:  A family history of epilepsy.  Had a seizure in the past.  A brain disorder.  A history of head injury, lack of oxygen at birth, or strokes. What are the signs or symptoms? There are many types of seizures. The symptoms vary depending on the type of seizure you have. Examples of symptoms during a seizure include:  Shaking (convulsions).  Stiffness in the body.  Passing out (losing consciousness).  Head nodding.  Staring.  Not responding to sound or touch.  Loss of bladder control and bowel control. Some people have symptoms right before and right after a seizure happens. Symptoms before a seizure may include:  Fear.  Worry (anxiety).  Feeling like you may vomit (nauseous).  Feeling like the room is spinning  (vertigo).  Feeling like you saw or heard something before (dj vu).  Odd tastes or smells.  Changes in how you see. You may see flashing lights or spots. Symptoms after a seizure happens can include:  Confusion.  Sleepiness.  Headache.  Weakness on one side of the body. How is this treated? Most seizures will stop on their own in under 5 minutes. In these cases, no treatment is needed. Seizures that last longer than 5 minutes will usually need treatment. Treatment can include:  Medicines given through an IV tube.  Avoiding things that are known to cause your seizures. These can include medicines that you take for another condition.  Medicines to treat epilepsy.  Surgery to stop the seizures. This may be needed if medicines do not help. Follow these instructions at home: Medicines  Take over-the-counter and prescription medicines only as told by your doctor.  Do not eat or drink anything that may keep your medicine from working, such as alcohol. Activity  Do not do any activities that would be dangerous if you had another seizure, like driving or swimming. Wait until your doctor says it is safe for you to do them.  If you live in the [place] ask your local DMV (department of motor vehicles) when you can drive.  Get plenty of rest. Teaching others Teach friends and family what to do when you have a seizure. They should:  Lay you on the ground.  Protect your head and body.  Loosen any tight clothing around your neck.  Turn you on your side.  Not hold you down.  Not put anything into your mouth.  Know whether or not you need emergency care.  Stay with you until you are better.  General instructions  Contact your doctor each time you have a seizure.  Avoid anything that gives you seizures.  Keep a seizure diary. Write down: ? What you think caused each seizure. ? What you remember about each seizure.  Keep all follow-up visits as told by your doctor.  This is important. Contact a doctor if:  You have another seizure.  You have seizures more often.  There is any change in what happens during your seizures.  You keep having seizures with treatment.  You have symptoms of being sick or having an infection. Get help right away if:  You have a seizure that: ? Lasts longer than 5 minutes. ? Is different than seizures you had before. ? Makes it harder to breathe. ? Happens after you hurt your head.  You have any of these symptoms after a seizure: ? Not being able to speak. ? Not being able to use a part of your body. ? Confusion. ? A bad headache.  You have two or more seizures in a row.  You do not wake up right after a seizure.  You get hurt during a seizure. These symptoms may be an emergency. Do not wait to see if the symptoms will go away. Get medical help right away. Call your local emergency services (911 in the [place] Do not drive yourself to the hospital. Summary  Seizures usually last from 30 seconds to 2 minutes. Usually, they are not harmful unless they last a long time.  Do not eat or drink anything that may keep your medicine from working, such as alcohol.  Teach friends and family what to do when you have a seizure.  Contact your doctor each time you have a seizure. This information is not intended to replace advice given to you by your health care provider. Make sure you discuss any questions you have with your health care provider. Document Revised: 05/04/2018 Document Reviewed: 05/04/2018 Elsevier Patient Education  [place].

## 2019-10-09 NOTE — Progress Notes (Signed)
PATIENT: Charles Randall. DOB: 09-29-1927  REASON FOR VISIT: follow up HISTORY FROM: patient  Chief Complaint  Patient presents with  . Follow-up    86yr f/u for seizures. States he has been doing well since last visit.   Marland Kitchen room 6     HISTORY OF PRESENT ILLNESS: Today 10/09/19 Charles Randall. is a 86 y.o. male here today for follow up for seizures. He continues levetiracetam 1500mg  and carbamazepine 100mg  BID. He is doing well. He is tolerating medications without obvious adverse effects. He is seen regularly by PCP. He has an appt this week for blood work.   HISTORY: (copied from Dr Gladstone Lighter note on 10/08/2018)  UPDATE (10/08/18, VRP): Since last visit, doing well. No seizures. Tolerating meds. No alleviating or aggravating factors. Having some loss of appetite, disinterest in activities.   UPDATE (04/04/18, VRP): Since last visit, was doing well until 03/23/18 and 03/25/18 (multiple breakthrough seizures). Was 1 month post-op from surgery (hip fracture). Now stable. No other triggering factors. Tolerating levetiracetam 1500mg  twice a day.   PCP discontinued aspirin 81mg  daily in March 2019 (? Age or bleeding risk).   UPDATE(09/04/2017, CM):Charles Randall,86 year old male returns for follow-up with history of seizure disorder /TIA.He is currently on aspirin for secondary stroke prevention without further TIA symptoms.He remains on Keppra 750 mg 2 tablets twice daily he denies any side effects to his medication. He ambulates with a rolling walker.No recent falls. He returns for reevaluation.  UPDATE 08/30/16 VRP: Since last visit, doing well. No more TIA or seizures. Tolerating medications.   UPDATE 05/23/16: Since last visit, was doing well until 2 weeks ago, had 10 min episode of right face drooping, slurred speech, right hand tingling (~05/02/16). Then resolved. Did not seek medical attention. Saw Dr. Hollie Salk, and she referred patient to see [place]. No other alleviating or aggravating  factors.  UPDATE 11/16/15: Since last visit, doing well. No definite seizures. Tolerating LEV 1500mg  BID. Patient not driving, due to concern from his friend about poss of un-witnessed seizures. No indication of this, but rather the theoretical possibility.   UPDATE 05/12/15: Since last visit, still having 1-2 seizures per year (staring spells; mouth automatisms) --> 2014 x 1; 2015 x 2; 2016 x 2. Still with interrupted sleep. Tolerating LEV 1000mg  BID.  UPDATE 05/29/12: Doing well. No seizures. Tolerating medications.  UPDATE 11/29/11: Has not had any seizure episodes. Tolerating LEV 1000mg  BID, tolerating well without mood changes or increased dizziness. No complaints today. He has been driving when he absolutely has no other way.   UPDATE 08/29/11: He has had 2 seizures on 08/11/11. Were similar to his previous seizures. Denies bowel/bladder incontinence. Denies sleep changes, missed doses, fever or poor food/fluid intake. He was increased to 1000mg  BID, tolerating well without dizziness or mood changes.   UPDATE 10/18/10: Had another spell on 10/13/10. Woke up normally, read paper. Then around 9am, was found by girlfried, slumped against wall in bedroom, with blood from mouth. Taken to ER by EMS. He was somewhat combative and confused for 2-3 hours. CT head and neck unremarkable. Started on LEV 500mg  BID. No further events. Family reports poor sleep patterns, snoring and daytime sleepiness. Sleeps 5 hrs per night, tossing and turning. Able to fall asleep very easily.  UPDATE 06/30/10: No further events. Last spell 03/15/10. Not driving. Feels good. Testing reviewed.  PRIOR HPI: 86 yo right-handed Caucasian male with a past medical history of HTN, hyperlipidemia, post-polio syndrome, vitamin D deficiency, osteoporosis  with history of compression fracture, left inguinal hernia, and eczema who presents for evaluation for possible atypical seizures. He is accompanied today by his  long-term girlfriend who helps provide details regarding these episodes. Starting about 2-3 years ago and occurring about every 6 months, the patient has had spells where he will "blank out" for about 20-30 minutes. He reports complete amnesia during the spells, but denies any other symptoms occurring during or after the episodes. Other than a possible "warning surge" in his head immediately prior to the onset of the episodes, he denies any symptoms preceding the episodes. He has a hard time describing what the "warning surge" is. His girlfriend has noticed that during the episodes he has a "blank stare," "his face gets really white," and he will ask many questions over and over such as: "Where are we?" and "What are we doing here?" She denies any other signs, including loss of postural control, loss of bowel or bladder continence, orofacial twitching, tongue biting, etc. Immediately following the episodes, the patient is back to his normal self. 08/2008 to 09/2008: MRI brain w/ and w/out, MRA w/out, carotid dopplers, EEG, and neurocognitive battery all unrevealing. 10-hour ambulatory EEG on 12/2009 which was negative. Most recent episode: March 15, 2010 around 12:30 PM, and was different from previous four episodes. The girlfriend found him "slumped down" in a chair with his legs and arms "as stiff as a board" (arms flexed at elbow, legs flexed at knees, eyes closed). She was able to rouse him after about 3-4 minutes, and he reports being "as weak as water" afterward, as well as very tired, which led him to sleep for the rest of the day.   REVIEW OF SYSTEMS: Out of a complete 14 system review of symptoms, the patient complains only of the following symptoms, seizures and all other reviewed systems are negative.  ALLERGIES: No Known Allergies  HOME MEDICATIONS: Outpatient Medications Prior to Visit  Medication Sig Dispense Refill  . Ascorbic Acid (VITAMIN C) 1000 MG tablet Take 1,000 mg by mouth daily.     Marland Kitchen aspirin EC 81 MG tablet Take 1 tablet (81 mg total) by mouth every other day. 90 tablet 3  . carbamazepine (CARBATROL) 100 MG 12 hr capsule Take 1 capsule (100 mg total) by mouth 2 (two) times daily. 180 capsule 4  . Cholecalciferol (VITAMIN D-3) 1000 UNITS CAPS Take 1,000 Units by mouth daily.     . imiquimod (ALDARA) 5 % cream APPLY ON THE SKIN AS DIRECTED EVERY OTHER NIGHT FOR 4 WEEKS    . levETIRAcetam (KEPPRA) 750 MG tablet Take 2 tablets (1,500 mg total) by mouth 2 (two) times daily. 360 tablet 4  . lisinopril-hydrochlorothiazide (PRINZIDE,ZESTORETIC) 20-25 MG tablet Take 1 tablet by mouth daily.   0  . Magnesium 250 MG TABS Take 250 mg by mouth daily.    Vladimir Faster Glycol-Propyl Glycol (LUBRICANT EYE DROPS) 0.4-0.3 % SOLN Place 1 drop into both eyes 3 (three) times daily as needed (for dry/irritated eyes.).    Marland Kitchen triamcinolone ointment (KENALOG) 0.1 % APPLY OINTMENT TOPICALLY TWICE DAILY AS NEEDED FOR FLARES    . vitamin B-12 (CYANOCOBALAMIN) 1000 MCG tablet Take 1,000 mcg by mouth daily.    . vitamin E 400 UNIT capsule Take 400 Units by mouth daily.     Marland Kitchen ezetimibe (ZETIA) 10 MG tablet Take 1 tablet (10 mg total) by mouth daily. ON HOLD FOR RASH 02/13/19 30 tablet 11   No facility-administered medications prior to visit.  PAST MEDICAL HISTORY: Past Medical History:  Diagnosis Date  . Anemia    STARTED PILLS 1 MONTH AGO NEW DX  . Aortic insufficiency    mild, moderate  . Arthritis    FINGERS SWELL PT THINKS ARTHITIS, TOP OF WRIST ALSO SWELLS ONCE IN A WHILE  . Bleeds easily ([place])    BRUISE EASY ALSO  . Chronic kidney disease   . Cough    LAST 3 TO 4 WEEKS PHLEGM YELLOW INTERMITTENT COUGH NO FEVER  . Dyslipidemia   . H/O dizziness    OCC   . H/O Doppler ultrasound 2010  . H/O echocardiogram 2013   aortic valve disorder, EF =>55%  . H/O echocardiogram 2012   AVD, EF =>55%  . Heart murmur   . History of hiatal hernia   . HTN (hypertension)   . Inguinal hernia   .  Mitral regurgitation    mild  . Pneumonia FEW YEARS AGO   X 1  . Post-polio syndrome age 51  . Seizures ([place])    last sz 2016 NO CAUSE FOUND, 3 seizures on 03/25/18   . Swelling of both lower extremities    DOES NOT ALWAYS DO DOWN   . Tricuspid regurgitation    mild    PAST SURGICAL HISTORY: Past Surgical History:  Procedure Laterality Date  . COLONSCOPY  5 YRS AGO  . DENTAL SURGERY  3-4 WEEKS AGO  . FEMORAL HERNIA REPAIR Right 08/17/2017   Procedure: HERNIA REPAIR FEMORAL LAPAROSCOPIC;  Surgeon: Michael Boston, MD;  Location: [facility];  Service: General;  Laterality: Right;  . INGUINAL HERNIA REPAIR Bilateral 08/17/2017   Procedure: LAPAROSCOPIC BILATERAL INGUINAL HERNIA REPAIR;  Surgeon: Michael Boston, MD;  Location: [facility];  Service: General;  Laterality: Bilateral;  . INSERTION OF MESH Bilateral 08/17/2017   Procedure: INSERTION OF MESH;  Surgeon: Michael Boston, MD;  Location: [facility];  Service: General;  Laterality: Bilateral;  . TOTAL HIP ARTHROPLASTY Left 02/13/2018   Procedure: HEMI  ARTHROPLASTY;  Surgeon: Paralee Cancel, MD;  Location: [facility];  Service: Orthopedics;  Laterality: Left;    FAMILY HISTORY: Family History  Problem Relation Age of Onset  . Cancer Mother   . Heart disease Father   . Cancer Maternal Grandmother   . Cancer Maternal Grandfather   . Kidney failure Sister     SOCIAL HISTORY: Social History   Socioeconomic History  . Marital status: Unknown    Spouse name: Not on file  . Number of children: 2  . Years of education: BA [employer]  . Highest education level: Not on file  Occupational History  . Occupation: Retired  Tobacco Use  . Smoking status: Former Smoker    Packs/day: 0.50    Years: 15.00    Pack years: 7.50    Quit date: 03/01/1963    Years since quitting: 56.6  . Smokeless tobacco: Never Used  Vaping Use  . Vaping Use: Never used  Substance and Sexual Activity  . Alcohol use: No  . Drug use: No  . Sexual activity: Not on file  Other  Topics Concern  . Not on file  Social History Narrative   86 year old right-handed male who lives at home alone.  He is single (has long-term girlfriend) and has two children.    He drinks one cup caffeinated coffee daily.   Social Determinants of Health   Financial Resource Strain:   . Difficulty of Paying Living Expenses:   Food Insecurity:   .  Worried About Charity fundraiser in the Last Year:   . Arboriculturist in the Last Year:   Transportation Needs:   . Film/video editor (Medical):   Marland Kitchen Lack of Transportation (Non-Medical):   Physical Activity:   . Days of Exercise per Week:   . Minutes of Exercise per Session:   Stress:   . Feeling of Stress :   Social Connections:   . Frequency of Communication with Friends and Family:   . Frequency of Social Gatherings with Friends and Family:   . Attends Religious Services:   . Active Member of Clubs or Organizations:   . Attends Archivist Meetings:   Marland Kitchen Marital Status:   Intimate Partner Violence:   . Fear of Current or Ex-Partner:   . Emotionally Abused:   Marland Kitchen Physically Abused:   . Sexually Abused:       PHYSICAL EXAM  Vitals:   10/09/19 1501  BP: 108/66  Pulse: 67  Weight: 137 lb 6.4 oz (62.3 kg)  Height: 5\' 6"  (1.676 m)   Body mass index is 22.18 kg/m.  Generalized: Well developed, in no acute distress  Cardiology: normal rate and rhythm, no murmur noted Respiratory: clear to auscultation bilaterally  Neurological examination  Mentation: Alert oriented to time, place, history taking. Follows all commands speech and language fluent Cranial nerve II-XII: Pupils were equal round reactive to light. Extraocular movements were full, visual field were full on confrontational test. Facial sensation and strength were normal. Uvula tongue midline. Head turning and shoulder shrug  were normal and symmetric. Motor: The motor testing reveals 5 over 5 strength of all 4 extremities. Good symmetric motor tone is  noted throughout.  Sensory: Sensory testing is intact to soft touch on all 4 extremities. No evidence of extinction is noted.  Coordination: Cerebellar testing reveals good finger-nose-finger and heel-to-shin bilaterally.  Gait and station: Gait is normal DIAGNOSTIC DATA (LABS, IMAGING, TESTING) - I reviewed patient records, labs, notes, testing and imaging myself where available.  No flowsheet data found.   Lab Results  Component Value Date   WBC 12.5 (H) 02/18/2018   HGB 10.5 (L) 02/18/2018   HCT 33.1 (L) 02/18/2018   MCV 92.5 02/18/2018   PLT 295 02/18/2018      Component Value Date/Time   NA 134 (L) 02/19/2018 1508   K 3.3 (L) 02/19/2018 1508   CL 96 (L) 02/19/2018 1508   CO2 29 02/19/2018 1508   GLUCOSE 147 (H) 02/19/2018 1508   BUN 31 (H) 02/19/2018 1508   CREATININE 1.36 (H) 02/19/2018 1508   CREATININE 2.11 (H) 02/16/2016 1144   CALCIUM 7.8 (L) 02/19/2018 1508   PROT 5.3 (L) 02/18/2018 0523   ALBUMIN 2.4 (L) 02/18/2018 0523   AST 18 02/18/2018 0523   ALT 12 02/18/2018 0523   ALKPHOS 81 02/18/2018 0523   BILITOT 1.1 02/18/2018 0523   GFRNONAA 46 (L) 02/19/2018 1508   GFRAA 53 (L) 02/19/2018 1508   Lab Results  Component Value Date   CHOL 176 01/30/2019   HDL 75 01/30/2019   LDLCALC 86 01/30/2019   TRIG 82 01/30/2019   CHOLHDL 2.3 01/30/2019   No results found for: HGBA1C Lab Results  Component Value Date   VITAMINB12 587 02/15/2018   No results found for: TSH     ASSESSMENT AND PLAN 86 y.o. year old male  has a past medical history of Anemia, Aortic insufficiency, Arthritis, Bleeds easily ([place]), Chronic kidney disease, Cough, Dyslipidemia,  H/O dizziness, H/O Doppler ultrasound (2010), H/O echocardiogram (2013), H/O echocardiogram (2012), Heart murmur, History of hiatal hernia, HTN (hypertension), Inguinal hernia, Mitral regurgitation, Pneumonia (FEW YEARS AGO), Post-polio syndrome (age 16), Seizures ([place]), Swelling of both lower extremities, and Tricuspid  regurgitation. here with     ICD-10-CM   1. Partial symptomatic epilepsy with complex partial seizures, not intractable, without status epilepticus ([place])  G40.209     Izeyah is doing well. We will continue levetiracetam 1500mg  and carbamazepine 100mg  BID. He was advised to have labs with PCP sent to our office. He was encouraged to stay active. Adequate hydration and well balanced diet advised. He will follow up in 1 year, sooner if needed.    No orders of the defined types were placed in this encounter.    No orders of the defined types were placed in this encounter.     I spent 15 minutes with the patient. 50% of this time was spent counseling and educating patient on plan of care and medications.    Debbora Presto, FNP-C 10/09/2019, 3:05 PM [facility] [address], [place], [place] [address] [phone]

## 2019-10-23 NOTE — Progress Notes (Signed)
I reviewed note and agree with plan.   Penni Bombard, MD 05/12/3886, 75:79 AM Certified in Neurology, Neurophysiology and Neuroimaging  [facility] [address], [place], [place] [address] [phone]

## 2019-11-21 ENCOUNTER — Encounter: Payer: Self-pay | Admitting: Cardiovascular Disease

## 2019-11-21 ENCOUNTER — Ambulatory Visit (INDEPENDENT_AMBULATORY_CARE_PROVIDER_SITE_OTHER): Payer: Medicare Other | Admitting: Cardiovascular Disease

## 2019-11-21 ENCOUNTER — Other Ambulatory Visit: Payer: Self-pay

## 2019-11-21 VITALS — BP 112/56 | HR 66 | Ht 66.0 in | Wt 138.2 lb

## 2019-11-21 DIAGNOSIS — I739 Peripheral vascular disease, unspecified: Secondary | ICD-10-CM

## 2019-11-21 DIAGNOSIS — I351 Nonrheumatic aortic (valve) insufficiency: Secondary | ICD-10-CM | POA: Diagnosis not present

## 2019-11-21 DIAGNOSIS — E782 Mixed hyperlipidemia: Secondary | ICD-10-CM

## 2019-11-21 DIAGNOSIS — I10 Essential (primary) hypertension: Secondary | ICD-10-CM

## 2019-11-21 DIAGNOSIS — I519 Heart disease, unspecified: Secondary | ICD-10-CM

## 2019-11-21 DIAGNOSIS — I712 Thoracic aortic aneurysm, without rupture: Secondary | ICD-10-CM

## 2019-11-21 DIAGNOSIS — N1832 Chronic kidney disease, stage 3b: Secondary | ICD-10-CM

## 2019-11-21 DIAGNOSIS — I7121 Aneurysm of the ascending aorta, without rupture: Secondary | ICD-10-CM

## 2019-11-21 NOTE — Progress Notes (Signed)
Patient ID: Charles Reineck., male   DOB: [birth year], 86 y.o.   MRN: [id number]    Cardiology Office Note    Date:  11/22/2019   ID:  Charles Big., DOB October 15, 1927, MRN [id number]  PCP:  Nickola Major, MD  Cardiologist:   Sanda Klein, MD   No chief complaint on file.   History of Present Illness:  Charles Haverstock. is a 86 y.o. male with long-standing systemic hypertension, normal left ventricular systolic function and moderate aortic insufficiency related to moderate size ascending aortic aneurysm, PAD. He had syncope in the past, identified after workup to be due to seizure. He has substantial asymmetry of his lower extremities due to remote polio syndrome.   He appears to be doing quite well and has no cardiovascular complaints.  He has not had any chest pain.  He is fairly sedentary and does not have angina, dyspnea or intermittent claudication at his current level of activity.  He has mild lower extremity edema, always worse on the right compared to the left, but he denies orthopnea or PND.  He wears zip up compression stockings that are easy to put on and work really well for him.  He has not had palpitations, dizziness, syncope or focal neurological events.  He is accompanied by his longtime companion.  He seems to have good quality of life although he is quite sedentary.  His vascular ultrasonography performed November 2019 shows symmetrical moderately severe reduction in flow to both lower extremities left ABI 0.63, right ABI of 0.65.  He has had previous problems with slowly healing ulcers on both ankles, but none recently.  His most recent echocardiogram in December 2019 shows normal left ventricular size and systolic function, aortic insufficiency is described as mild (in the past was described as moderate), the visualized portion of the ascending aorta measures 4.6 cm.  By previous CT angiogram of the chest in 2017 the maximum aortic diameter was 4.8 cm.  Declined routine  imaging follow-up since he is not planning to ever have aneurysm surgery.    Past Medical History:  Diagnosis Date  . Anemia    STARTED PILLS 1 MONTH AGO NEW DX  . Aortic insufficiency    mild, moderate  . Arthritis    FINGERS SWELL PT THINKS ARTHITIS, TOP OF WRIST ALSO SWELLS ONCE IN A WHILE  . Bleeds easily ([place])    BRUISE EASY ALSO  . Chronic kidney disease   . Cough    LAST 3 TO 4 WEEKS PHLEGM YELLOW INTERMITTENT COUGH NO FEVER  . Dyslipidemia   . H/O dizziness    OCC   . H/O Doppler ultrasound 2010  . H/O echocardiogram 2013   aortic valve disorder, EF =>55%  . H/O echocardiogram 2012   AVD, EF =>55%  . Heart murmur   . History of hiatal hernia   . HTN (hypertension)   . Inguinal hernia   . Mitral regurgitation    mild  . Pneumonia FEW YEARS AGO   X 1  . Post-polio syndrome age 73  . Seizures ([place])    last sz 2016 NO CAUSE FOUND, 3 seizures on 03/25/18   . Swelling of both lower extremities    DOES NOT ALWAYS DO DOWN   . Tricuspid regurgitation    mild    Past Surgical History:  Procedure Laterality Date  . COLONSCOPY  5 YRS AGO  . DENTAL SURGERY  3-4 WEEKS AGO  . FEMORAL HERNIA REPAIR  Right 08/17/2017   Procedure: HERNIA REPAIR FEMORAL LAPAROSCOPIC;  Surgeon: Michael Boston, MD;  Location: [facility];  Service: General;  Laterality: Right;  . INGUINAL HERNIA REPAIR Bilateral 08/17/2017   Procedure: LAPAROSCOPIC BILATERAL INGUINAL HERNIA REPAIR;  Surgeon: Michael Boston, MD;  Location: [facility];  Service: General;  Laterality: Bilateral;  . INSERTION OF MESH Bilateral 08/17/2017   Procedure: INSERTION OF MESH;  Surgeon: Michael Boston, MD;  Location: [facility];  Service: General;  Laterality: Bilateral;  . TOTAL HIP ARTHROPLASTY Left 02/13/2018   Procedure: HEMI  ARTHROPLASTY;  Surgeon: Paralee Cancel, MD;  Location: [facility];  Service: Orthopedics;  Laterality: Left;    Outpatient Medications Prior to Visit  Medication Sig Dispense Refill  . Ascorbic Acid (VITAMIN C) 1000  MG tablet Take 1,000 mg by mouth daily.    Marland Kitchen aspirin EC 81 MG tablet Take 1 tablet (81 mg total) by mouth every other day. 90 tablet 3  . carbamazepine (CARBATROL) 100 MG 12 hr capsule Take 1 capsule (100 mg total) by mouth 2 (two) times daily. 180 capsule 4  . Cholecalciferol (VITAMIN D-3) 1000 UNITS CAPS Take 1,000 Units by mouth daily.     . imiquimod (ALDARA) 5 % cream APPLY ON THE SKIN AS DIRECTED EVERY OTHER NIGHT FOR 4 WEEKS    . levETIRAcetam (KEPPRA) 750 MG tablet Take 2 tablets (1,500 mg total) by mouth 2 (two) times daily. 360 tablet 4  . lisinopril-hydrochlorothiazide (PRINZIDE,ZESTORETIC) 20-25 MG tablet Take 1 tablet by mouth daily.   0  . Magnesium 250 MG TABS Take 250 mg by mouth daily.    Marland Kitchen triamcinolone ointment (KENALOG) 0.1 % APPLY OINTMENT TOPICALLY TWICE DAILY AS NEEDED FOR FLARES    . vitamin B-12 (CYANOCOBALAMIN) 1000 MCG tablet Take 1,000 mcg by mouth daily.    . vitamin E 400 UNIT capsule Take 400 Units by mouth daily.     Vladimir Faster Glycol-Propyl Glycol (LUBRICANT EYE DROPS) 0.4-0.3 % SOLN Place 1 drop into both eyes 3 (three) times daily as needed (for dry/irritated eyes.).    Marland Kitchen ezetimibe (ZETIA) 10 MG tablet Take 1 tablet (10 mg total) by mouth daily. ON HOLD FOR RASH 02/13/19 30 tablet 11   No facility-administered medications prior to visit.     Allergies:   Patient has no known allergies.   Social History   Socioeconomic History  . Marital status: Unknown    Spouse name: Not on file  . Number of children: 2  . Years of education: BA [employer]  . Highest education level: Not on file  Occupational History  . Occupation: Retired  Tobacco Use  . Smoking status: Former Smoker    Packs/day: 0.50    Years: 15.00    Pack years: 7.50    Quit date: 03/01/1963    Years since quitting: 56.7  . Smokeless tobacco: Never Used  Vaping Use  . Vaping Use: Never used  Substance and Sexual Activity  . Alcohol use: No  . Drug use: No  . Sexual activity: Not on file    Other Topics Concern  . Not on file  Social History Narrative   86 year old right-handed male who lives at home alone.  He is single (has long-term girlfriend) and has two children.    He drinks one cup caffeinated coffee daily.   Social Determinants of Health   Financial Resource Strain:   . Difficulty of Paying Living Expenses: Not on file  Food Insecurity:   . Worried About  Running Out of Food in the Last Year: Not on file  . Ran Out of Food in the Last Year: Not on file  Transportation Needs:   . Lack of Transportation (Medical): Not on file  . Lack of Transportation (Non-Medical): Not on file  Physical Activity:   . Days of Exercise per Week: Not on file  . Minutes of Exercise per Session: Not on file  Stress:   . Feeling of Stress : Not on file  Social Connections:   . Frequency of Communication with Friends and Family: Not on file  . Frequency of Social Gatherings with Friends and Family: Not on file  . Attends Religious Services: Not on file  . Active Member of Clubs or Organizations: Not on file  . Attends Archivist Meetings: Not on file  . Marital Status: Not on file     Family History:  The patient's family history includes Cancer in his maternal grandfather, maternal grandmother, and mother; Heart disease in his father; Kidney failure in his sister.   ROS:   Please see the history of present illness.    ROS All other systems are reviewed and are negative  PHYSICAL EXAM:   VS:  BP (!) 112/56   Pulse 66   Ht 5\' 6"  (1.676 m)   Wt 138 lb 3.2 oz (62.7 kg)   SpO2 99%   BMI 22.31 kg/m      General: Alert, oriented x3, no distress, elderly Head: no evidence of trauma, PERRL, EOMI, no exophtalmos or lid lag, no myxedema, no xanthelasma; normal ears, nose and oropharynx Neck: normal jugular venous pulsations and no hepatojugular reflux; brisk carotid pulses without delay and no carotid bruits Chest: clear to auscultation, no signs of consolidation by  percussion or palpation, normal fremitus, symmetrical and full respiratory excursions Cardiovascular: normal position and quality of the apical impulse, regular rhythm, normal first and second heart sounds, 3/6 early peaking aortic ejection murmur, 2/6 decrescendo murmur of aortic insufficiency, no rubs or gallops Abdomen: no tenderness or distention, no masses by palpation, no abnormal pulsatility or arterial bruits, normal bowel sounds, no hepatosplenomegaly Extremities: no clubbing, cyanosis or edema; 2+ radial, ulnar and brachial pulses bilaterally; unable to palpate his pedal pulses Neurological: grossly nonfocal Psych: Normal mood and affect   Wt Readings from Last 3 Encounters:  11/21/19 138 lb 3.2 oz (62.7 kg)  10/09/19 137 lb 6.4 oz (62.3 kg)  11/21/18 130 lb (59 kg)    Studies/Labs Reviewed:   ECHO: October 23, 2017 - Left ventricle: The cavity size was normal. Systolic function was normal. The estimated ejection fraction was in the range of 60% to 65%. Wall motion was normal; there were no regional wall motion abnormalities. Doppler parameters are consistent with abnormal left ventricular relaxation (grade 1 diastolic dysfunction). Doppler parameters are consistent with indeterminate ventricular filling pressure. - Aortic valve: Valve mobility was restricted. Transvalvular velocity was within the normal range. There was no stenosis.   There was moderate regurgitation. Peak velocity (S): 205 cm/s. Mean gradient (S): 8 mm Hg. Regurgitation pressure half-time: 451 ms. - Aorta: Ascending aortic diameter: 46 mm (S). - Ascending aorta: The ascending aorta was moderately dilated. - Mitral valve: Transvalvular velocity was within the normal range. There was no evidence for stenosis. There was mild regurgitation. - Left atrium: The atrium was mildly dilated. - Right ventricle: The cavity size was mildly dilated. Wall thickness was normal. Systolic function was normal. - Right atrium: The  atrium was mildly  dilated. - Atrial septum: No defect or patent foramen ovale was identified. - Pulmonary arteries: Systolic pressure was within the normal range. PA peak pressure: 32 mm Hg (S).  Echo 02/16/2018: - Left ventricle: The cavity size was normal. Wall thickness was   normal. Systolic function was normal. The estimated ejection   fraction was in the range of 50% to 55%. Wall motion was normal;   there were no regional wall motion abnormalities. Doppler   parameters are consistent with abnormal left ventricular   relaxation (grade 1 diastolic dysfunction). Doppler parameters   are consistent with high ventricular filling pressure. - Aortic valve: There was mild regurgitation. - Ascending aorta: The ascending aorta was mildly dilated. - Mitral valve: Calcified annulus. - Pulmonary arteries: Systolic pressure was moderately increased.   PA peak pressure: 54 mm Hg (S).  Impressions:  - Normal LV function; mild diastolic dysfunction; sclerotic aortic   valve with mild AI; mildly dilated ascending aorta; mild TR with   moderate pulmonary hypertension.  EKG:  EKG is ordered today.  It shows sinus rhythm, first-degree AV block with PR interval of 256 ms, otherwise normal tracing  BMET    Component Value Date/Time   NA 134 (L) 02/19/2018 1508   K 3.3 (L) 02/19/2018 1508   CL 96 (L) 02/19/2018 1508   CO2 29 02/19/2018 1508   GLUCOSE 147 (H) 02/19/2018 1508   BUN 31 (H) 02/19/2018 1508   CREATININE 1.36 (H) 02/19/2018 1508   CREATININE 2.11 (H) 02/16/2016 1144   CALCIUM 7.8 (L) 02/19/2018 1508   GFRNONAA 46 (L) 02/19/2018 1508   GFRAA 53 (L) 02/19/2018 1508   12/22/ 2020 total cholesterol 176, HDL 75, LDL 86, triglycerides 82  ASSESSMENT:    1. Nonrheumatic aortic (valve) insufficiency   2. Ascending aortic aneurysm ([place])   3. Essential hypertension   4. Left ventricular diastolic dysfunction   5. Stage 3b chronic kidney disease ([place])   6. PAD (peripheral artery  disease) ([place])   7. Mixed hyperlipidemia      PLAN:  In order of problems listed above:  1. 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  No plan for future surgery so routine monitoring is not indicated. 2. Asc Ao aneurysm: Similarly he declines ever undergoing surgical repair so routine imaging is not planned. 3. HTN: Excellent control.  Needs to be kept fairly low to reduce the impact of the aortic insufficiency. 4. LV diastolic dysfunction: Described by echo, but never clinically apparent as heart failure, not on loop diuretics. 5. Stage III chronic kidney disease: Renal function appears to be stable with a creatinine around 1.4 and a GFR around 40. 6. PAD: Quite sedentary, but denies intermittent claudication.  Appears to have significant occlusive arterial disease in his lower extremities, but no evidence of threatened limb.  Continue aspirin and lipid-lowering therapy. 7. HLP: Satisfactory lipid profile considering his advanced age in the absence of symptomatic atherosclerotic lesions.    Medication Adjustments/Labs and Tests Ordered: Current medicines are reviewed at length with the patient today.  Concerns regarding medicines are outlined above.  Medication changes, Labs and Tests ordered today are listed in the Patient Instructions below. Patient Instructions  Medication Instructions:  No changes *If you need a refill on your cardiac medications before your next appointment, please call your pharmacy*   Lab Work: None ordered If you have labs (blood work) drawn today and your tests are completely normal, you will receive your results  only by: Marland Kitchen MyChart Message (if you have MyChart) OR . A paper copy in the mail If you have any lab test that is abnormal or we need to change your treatment, we will call you to review the results.   Testing/Procedures: None ordered   Follow-Up: At [facility], you and your health needs are our  priority.  As part of our continuing mission to provide you with exceptional heart care, we have created designated Provider Care Teams.  These Care Teams include your primary Cardiologist (physician) and Advanced Practice Providers (APPs -  Physician Assistants and Nurse Practitioners) who all work together to provide you with the care you need, when you need it.  We recommend signing up for the patient portal called "MyChart".  Sign up information is provided on this After Visit Summary.  MyChart is used to connect with patients for Virtual Visits (Telemedicine).  Patients are able to view lab/test results, encounter notes, upcoming appointments, etc.  Non-urgent messages can be sent to your provider as well.   To learn more about what you can do with MyChart, go to [email or web address].    Your next appointment:   12 month(s)  The format for your next appointment:   In Person  Provider:   You may see Sanda Klein, MD or one of the following Advanced Practice Providers on your designated Care Team:    Almyra Deforest, PA-C  Fabian Sharp, [place] or   Roby Lofts, PA-C      Signed, Sanda Klein, MD  11/22/2019 9:11 PM    [facility] [place], [place], [place]  [address] Phone: [phone]; Fax: [phone]

## 2019-11-21 NOTE — Patient Instructions (Signed)

## 2019-11-25 ENCOUNTER — Telehealth: Payer: Self-pay | Admitting: Cardiovascular Disease

## 2019-11-25 NOTE — Telephone Encounter (Signed)
Jon Gills called to let Dr. Sallyanne Kuster know where the patient got the compression socks. He got them from [employer]. Type in the name [employer] and it shows different zipper compression socks.

## 2019-11-25 NOTE — Telephone Encounter (Signed)
Thanks

## 2019-12-08 ENCOUNTER — Emergency Department (HOSPITAL_BASED_OUTPATIENT_CLINIC_OR_DEPARTMENT_OTHER)
Admission: EM | Admit: 2019-12-08 | Discharge: 2019-12-09 | Disposition: A | Discharge status: Home / Self Care | Payer: Medicare Other | Attending: Emergency Medicine | Admitting: Emergency Medicine

## 2019-12-08 ENCOUNTER — Other Ambulatory Visit: Payer: Self-pay

## 2019-12-08 ENCOUNTER — Emergency Department (HOSPITAL_COMMUNITY): Payer: Medicare Other

## 2019-12-08 ENCOUNTER — Emergency Department (HOSPITAL_BASED_OUTPATIENT_CLINIC_OR_DEPARTMENT_OTHER): Payer: Medicare Other

## 2019-12-08 ENCOUNTER — Encounter (HOSPITAL_BASED_OUTPATIENT_CLINIC_OR_DEPARTMENT_OTHER): Payer: Self-pay | Admitting: Emergency Medicine

## 2019-12-08 DIAGNOSIS — H510 Palsy (spasm) of conjugate gaze: Secondary | ICD-10-CM | POA: Diagnosis not present

## 2019-12-08 DIAGNOSIS — N183 Chronic kidney disease, stage 3 unspecified: Secondary | ICD-10-CM | POA: Insufficient documentation

## 2019-12-08 DIAGNOSIS — Z79899 Other long term (current) drug therapy: Secondary | ICD-10-CM | POA: Insufficient documentation

## 2019-12-08 DIAGNOSIS — Z96642 Presence of left artificial hip joint: Secondary | ICD-10-CM | POA: Insufficient documentation

## 2019-12-08 DIAGNOSIS — R519 Headache, unspecified: Secondary | ICD-10-CM | POA: Diagnosis present

## 2019-12-08 DIAGNOSIS — H532 Diplopia: Secondary | ICD-10-CM

## 2019-12-08 DIAGNOSIS — R001 Bradycardia, unspecified: Secondary | ICD-10-CM | POA: Diagnosis not present

## 2019-12-08 DIAGNOSIS — H4902 Third [oculomotor] nerve palsy, left eye: Secondary | ICD-10-CM

## 2019-12-08 DIAGNOSIS — Z87891 Personal history of nicotine dependence: Secondary | ICD-10-CM | POA: Insufficient documentation

## 2019-12-08 DIAGNOSIS — Z7982 Long term (current) use of aspirin: Secondary | ICD-10-CM | POA: Diagnosis not present

## 2019-12-08 DIAGNOSIS — I5031 Acute diastolic (congestive) heart failure: Secondary | ICD-10-CM | POA: Insufficient documentation

## 2019-12-08 DIAGNOSIS — I130 Hypertensive heart and chronic kidney disease with heart failure and stage 1 through stage 4 chronic kidney disease, or unspecified chronic kidney disease: Secondary | ICD-10-CM | POA: Insufficient documentation

## 2019-12-08 LAB — COMPREHENSIVE METABOLIC PANEL
ALT: 13 U/L (ref 0–44)
AST: 22 U/L (ref 15–41)
Albumin: 3.9 g/dL (ref 3.5–5.0)
Alkaline Phosphatase: 114 U/L (ref 38–126)
Anion gap: 8 (ref 5–15)
BUN: 34 mg/dL — ABNORMAL HIGH (ref 8–23)
CO2: 28 mmol/L (ref 22–32)
Calcium: 9 mg/dL (ref 8.9–10.3)
Chloride: 100 mmol/L (ref 98–111)
Creatinine, Ser: 1.57 mg/dL — ABNORMAL HIGH (ref 0.61–1.24)
GFR, Estimated: 38 mL/min — ABNORMAL LOW (ref >60)
Glucose, Bld: 108 mg/dL — ABNORMAL HIGH (ref 70–99)
Potassium: 4.2 mmol/L (ref 3.5–5.1)
Sodium: 136 mmol/L (ref 135–145)
Total Bilirubin: 0.4 mg/dL (ref 0.3–1.2)
Total Protein: 7.1 g/dL (ref 6.5–8.1)

## 2019-12-08 LAB — CBC
HCT: 37.3 % — ABNORMAL LOW (ref 39.0–52.0)
Hemoglobin: 12.3 g/dL — ABNORMAL LOW (ref 13.0–17.0)
MCH: 31 pg (ref 26.0–34.0)
MCHC: 33 g/dL (ref 30.0–36.0)
MCV: 94 fL (ref 80.0–100.0)
Platelets: 196 10*3/uL (ref 150–400)
RBC: 3.97 MIL/uL — ABNORMAL LOW (ref 4.22–5.81)
RDW: 12.7 % (ref 11.5–15.5)
WBC: 5.3 10*3/uL (ref 4.0–10.5)
nRBC: 0 % (ref 0.0–0.2)

## 2019-12-08 LAB — APTT: aPTT: 38 seconds — ABNORMAL HIGH (ref 24–36)

## 2019-12-08 LAB — DIFFERENTIAL
Abs Immature Granulocytes: 0.03 10*3/uL (ref 0.00–0.07)
Basophils Absolute: 0 10*3/uL (ref 0.0–0.1)
Basophils Relative: 1 %
Eosinophils Absolute: 0.2 10*3/uL (ref 0.0–0.5)
Eosinophils Relative: 4 %
Immature Granulocytes: 1 %
Lymphocytes Relative: 11 %
Lymphs Abs: 0.6 10*3/uL — ABNORMAL LOW (ref 0.7–4.0)
Monocytes Absolute: 0.7 10*3/uL (ref 0.1–1.0)
Monocytes Relative: 13 %
Neutro Abs: 3.7 10*3/uL (ref 1.7–7.7)
Neutrophils Relative %: 70 %

## 2019-12-08 LAB — PROTIME-INR
INR: 1 (ref 0.8–1.2)
Prothrombin Time: 12.8 seconds (ref 11.4–15.2)

## 2019-12-08 MED ORDER — ACETAMINOPHEN 325 MG PO TABS
650.0000 mg | ORAL_TABLET | Freq: Once | ORAL | Status: DC
  Filled 2019-12-08: qty 2

## 2019-12-08 MED ORDER — SODIUM CHLORIDE 0.9 % IV BOLUS
500.0000 mL | Freq: Once | INTRAVENOUS | Status: AC
  Administered 2019-12-08: 500 mL via INTRAVENOUS

## 2019-12-08 MED ORDER — IOHEXOL 350 MG/ML SOLN
100.0000 mL | Freq: Once | INTRAVENOUS | Status: AC | PRN
  Administered 2019-12-08: 75 mL via INTRAVENOUS

## 2019-12-08 MED ORDER — GADOBUTROL 1 MMOL/ML IV SOLN
6.5000 mL | Freq: Once | INTRAVENOUS | Status: AC | PRN
  Administered 2019-12-08: 6.5 mL via INTRAVENOUS

## 2019-12-08 NOTE — ED Provider Notes (Signed)
[facility] EMERGENCY DEPARTMENT Provider Note   CSN: [id number] Arrival date & time: 12/08/19  1139     History Chief Complaint  Patient presents with  . Headache    Charles Randall. is a 86 y.o. male.  Presents to ER with concern for headache as well as double vision.  He has been having daily headaches for the last few days.  Headaches are not worse headache of his life, not sudden onset.  Tends to be worse in the morning, up to 4 or 5 out of 10 in severity.  Dull achy, frontal.  During the same timeframe, he has also noted double vision, problems with his left eye.  When he closes his right eyelid, he has no double vision, no blurry vision.  The double vision only occurs when he has both eyes open.  He denies prior history of stroke, TIA.  Has history of seizure disorder, no recent seizures.  No recent changes in medications.  Does not want any medication for his headache.  No associated numbness, weakness, speech changes, gait changes.  HPI     Past Medical History:  Diagnosis Date  . Anemia    STARTED PILLS 1 MONTH AGO NEW DX  . Aortic insufficiency    mild, moderate  . Arthritis    FINGERS SWELL PT THINKS ARTHITIS, TOP OF WRIST ALSO SWELLS ONCE IN A WHILE  . Bleeds easily ([place])    BRUISE EASY ALSO  . Chronic kidney disease   . Cough    LAST 3 TO 4 WEEKS PHLEGM YELLOW INTERMITTENT COUGH NO FEVER  . Dyslipidemia   . H/O dizziness    OCC   . H/O Doppler ultrasound 2010  . H/O echocardiogram 2013   aortic valve disorder, EF =>55%  . H/O echocardiogram 2012   AVD, EF =>55%  . Heart murmur   . History of hiatal hernia   . HTN (hypertension)   . Inguinal hernia   . Mitral regurgitation    mild  . Pneumonia FEW YEARS AGO   X 1  . Post-polio syndrome age 38  . Seizures ([place])    last sz 2016 NO CAUSE FOUND, 3 seizures on 03/25/18   . Swelling of both lower extremities    DOES NOT ALWAYS DO DOWN   . Tricuspid regurgitation    mild    Patient Active  Problem List   Diagnosis Date Noted  . Acute diastolic CHF (congestive heart failure) ([place])   . History of hemiarthroplasty of left hip 02/13/2018  . Closed fracture of neck of left femur ([place]) 02/12/2018  . Moderate aortic regurgitation 02/12/2018  . CKD (chronic kidney disease), stage III ([place]) 02/12/2018  . Displaced fracture of left femoral neck ([place]) 02/12/2018  . Left displaced femoral neck fracture ([place]) 02/12/2018  . Arterial insufficiency of lower extremity ([place]) 11/22/2017  . Bilateral inguinal hernia (BIH) s/p lap hernia repair w mesh 08/17/2017 08/17/2017  . Femoral hernia - right - s/p lap hernia repair w mesh 08/17/2017 08/17/2017  . Dysphagia 04/06/2016  . Partial symptomatic epilepsy with complex partial seizures, not intractable, without status epilepticus ([place]) 11/16/2015  . Normocytic anemia 10/13/2015  . Seizure disorder ([place]) 05/12/2015  . Edema of lower extremity 04/09/2015  . Benign essential hypertension 04/09/2015  . Pleural effusion, right 04/09/2015  . 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 Memory loss 04/07/2015  . Mixed hyperlipidemia 04/07/2015  . Pain of left thumb 04/07/2015  . Simple partial seizure disorder ([place]) 04/07/2015  .  Skin lesion of right arm 04/07/2015  . Transient alteration of awareness 04/07/2015    Past Surgical History:  Procedure Laterality Date  . COLONSCOPY  5 YRS AGO  . DENTAL SURGERY  3-4 WEEKS AGO  . FEMORAL HERNIA REPAIR Right 08/17/2017   Procedure: HERNIA REPAIR FEMORAL LAPAROSCOPIC;  Surgeon: Michael Boston, MD;  Location: [facility];  Service: General;  Laterality: Right;  . INGUINAL HERNIA REPAIR Bilateral 08/17/2017   Procedure: LAPAROSCOPIC BILATERAL INGUINAL HERNIA REPAIR;  Surgeon: Michael Boston, MD;  Location: [facility];  Service: General;  Laterality: Bilateral;  . INSERTION OF MESH Bilateral 08/17/2017   Procedure: INSERTION OF MESH;  Surgeon: Michael Boston, MD;  Location: [facility];  Service: General;  Laterality: Bilateral;  .  TOTAL HIP ARTHROPLASTY Left 02/13/2018   Procedure: HEMI  ARTHROPLASTY;  Surgeon: Paralee Cancel, MD;  Location: [facility];  Service: Orthopedics;  Laterality: Left;       Family History  Problem Relation Age of Onset  . Cancer Mother   . Heart disease Father   . Cancer Maternal Grandmother   . Cancer Maternal Grandfather   . Kidney failure Sister     Social History   Tobacco Use  . Smoking status: Former Smoker    Packs/day: 0.50    Years: 15.00    Pack years: 7.50    Quit date: 03/01/1963    Years since quitting: 56.8  . Smokeless tobacco: Never Used  Vaping Use  . Vaping Use: Never used  Substance Use Topics  . Alcohol use: No  . Drug use: No    Home Medications Prior to Admission medications   Medication Sig Start Date End Date Taking? Authorizing Provider  Ascorbic Acid (VITAMIN C) 1000 MG tablet Take 1,000 mg by mouth daily.    [provider]  aspirin EC 81 MG tablet Take 1 tablet (81 mg total) by mouth every other day. 11/21/18   Croitoru, Mihai, MD  carbamazepine (CARBATROL) 100 MG 12 hr capsule Take 1 capsule (100 mg total) by mouth 2 (two) times daily. 10/09/19   Lomax, Amy, NP  Cholecalciferol (VITAMIN D-3) 1000 UNITS CAPS Take 1,000 Units by mouth daily.     [provider]  ezetimibe (ZETIA) 10 MG tablet Take 1 tablet (10 mg total) by mouth daily. ON HOLD FOR RASH 02/13/19 02/13/19 05/14/19  Croitoru, Mihai, MD  imiquimod (ALDARA) 5 % cream APPLY ON THE SKIN AS DIRECTED EVERY OTHER NIGHT FOR 4 WEEKS 10/30/18   [provider]  levETIRAcetam (KEPPRA) 750 MG tablet Take 2 tablets (1,500 mg total) by mouth 2 (two) times daily. 10/09/19   Lomax, Amy, NP  lisinopril-hydrochlorothiazide (PRINZIDE,ZESTORETIC) 20-25 MG tablet Take 1 tablet by mouth daily.  05/24/17   [provider]  Magnesium 250 MG TABS Take 250 mg by mouth daily.    [provider]  triamcinolone ointment (KENALOG) 0.1 % APPLY OINTMENT TOPICALLY TWICE DAILY AS  NEEDED FOR FLARES 07/03/18   [provider]  vitamin B-12 (CYANOCOBALAMIN) 1000 MCG tablet Take 1,000 mcg by mouth daily.    [provider]  vitamin E 400 UNIT capsule Take 400 Units by mouth daily.     [provider]    Allergies    Patient has no known allergies.  Review of Systems   Review of Systems  Constitutional: Negative for chills and fever.  HENT: Negative for ear pain and sore throat.   Eyes: Positive for visual disturbance. Negative for photophobia and pain.  Respiratory:  Negative for cough and shortness of breath.   Cardiovascular: Negative for chest pain and palpitations.  Gastrointestinal: Negative for abdominal pain and vomiting.  Genitourinary: Negative for dysuria and hematuria.  Musculoskeletal: Negative for arthralgias and back pain.  Skin: Negative for color change and rash.  Neurological: Positive for headaches. Negative for seizures and syncope.  All other systems reviewed and are negative.   Physical Exam Updated Vital Signs BP (!) 157/68   Pulse (!) 50   Temp 97.9 F (36.6 C) (Oral)   Resp 12   Ht 5\' 6"  (1.676 m)   Wt 62.7 kg   SpO2 100%   BMI 22.31 kg/m   Physical Exam Vitals and nursing note reviewed.  Constitutional:      Appearance: He is well-developed.  HENT:     Head: Normocephalic and atraumatic.  Eyes:     Conjunctiva/sclera: Conjunctivae normal.     Comments: Normal pupils, no APD, see neuro regarding EOM  Cardiovascular:     Rate and Rhythm: Regular rhythm. Bradycardia present.     Heart sounds: Normal heart sounds. No murmur heard.   Pulmonary:     Effort: Pulmonary effort is normal. No respiratory distress.     Breath sounds: Normal breath sounds.  Abdominal:     Palpations: Abdomen is soft.     Tenderness: There is no abdominal tenderness.  Musculoskeletal:     Cervical back: Neck supple.  Skin:    General: Skin is warm and dry.     Capillary Refill: Capillary refill takes less than 2  seconds.  Neurological:     Mental Status: He is alert.     Comments: AAOx3 CN 2-12 intact except for CN III, patient is unable to adduct his left eye, decreased strength of left upper eye lid, visual fields are intact in both eyes, normal pupils, no afferent pupillary defect, speech clear 5/5 strength in b/l UE and LE Sensation to light touch intact in b/l UE and LE Normal FNF Normal gait  Psychiatric:        Mood and Affect: Mood normal.        Behavior: Behavior normal.     ED Results / Procedures / Treatments   Labs (all labs ordered are listed, but only abnormal results are displayed) Labs Reviewed  APTT - Abnormal; Notable for the following components:      Result Value   aPTT 38 (*)    All other components within normal limits  CBC - Abnormal; Notable for the following components:   RBC 3.97 (*)    Hemoglobin 12.3 (*)    HCT 37.3 (*)    All other components within normal limits  DIFFERENTIAL - Abnormal; Notable for the following components:   Lymphs Abs 0.6 (*)    All other components within normal limits  COMPREHENSIVE METABOLIC PANEL - Abnormal; Notable for the following components:   Glucose, Bld 108 (*)    BUN 34 (*)    Creatinine, Ser 1.57 (*)    GFR, Estimated 38 (*)    All other components within normal limits  PROTIME-INR    EKG EKG Interpretation  Date/Time:  Sunday December 08 2019 12:00:03 EDT Ventricular Rate:  63 PR Interval:  234 QRS Duration: 76 QT Interval:  396 QTC Calculation: 405 R Axis:     Text Interpretation: Sinus rhythm with 1st degree A-V block with Premature atrial complexes Otherwise normal ECG Confirmed by Madalyn Rob [phone]) on 12/08/2019 3:42:54 PM   Radiology CT Angio  Head W or Wo Contrast  Result Date: 12/08/2019 CLINICAL DATA:  Left-sided weakness today with headache. EXAM: CT ANGIOGRAPHY HEAD AND NECK TECHNIQUE: Multidetector CT imaging of the head and neck was performed using the standard protocol during bolus  administration of intravenous contrast. Multiplanar CT image reconstructions and MIPs were obtained to evaluate the vascular anatomy. Carotid stenosis measurements (when applicable) are obtained utilizing NASCET criteria, using the distal internal carotid diameter as the denominator. CONTRAST:  43mL OMNIPAQUE IOHEXOL 350 MG/ML SOLN COMPARISON:  CT head without contrast 12/08/2019 FINDINGS: CTA NECK FINDINGS Aortic arch: 3 vessel arch configuration is present. No significant stenosis is present at the great vessel origins. Right carotid system: The right common carotid artery is within normal limits. Atherosclerotic changes are present at the bifurcation without a significant stenosis relative to the more distal vessel. Cervical right ICA is within normal limits. Left carotid system: The left common carotid artery is within normal limits. Atherosclerotic calcifications are present at the left carotid bifurcation. No significant stenosis is present relative to the more distal vessel. Vertebral arteries: The left vertebral artery is the dominant vessel. Both vertebral arteries originate from the vertebra scratched at both vertebral arteries originate from the subclavian arteries. Bilateral high-grade stenoses are present at the origins. Skeleton: Multilevel degenerative changes are present in the cervical spine with endplate changes greatest at C5-6 and C6-7. No focal lytic or blastic lesions are present. Other neck: 13 mm hypodense nodule is present in posteroinferior right lobe of the thyroid. No significant cervical adenopathy is present. Soft tissues of the neck are otherwise unremarkable. Upper chest: Lung apices are clear. Scarring is present the lung apices. Lungs are otherwise clear. Thoracic inlet is within normal limits. Review of the MIP images confirms the above findings CTA HEAD FINDINGS Anterior circulation: Atherosclerotic calcifications are present in the cavernous internal carotid arteries bilaterally.  No focal stenosis is present. ICA termini are within normal limits. The A1 and M1 segments are normal. The anterior communicating artery is patent. MCA bifurcations are within normal limits. The ACA and MCA branch vessels are within normal limits. Posterior circulation: The left vertebral artery is dominant vessel. PICA origins are visualized and normal. Vertebrobasilar junction is normal. The basilar artery is normal. Left posterior cerebral artery originates from basilar tip. Right posterior cerebral artery is fed by a P1 segment and posterior communicating artery. PCA branch vessels are within normal limits bilaterally. Venous sinuses: The right transverse sinus is dominant. Straight sinus deep cerebral veins are intact. There is delayed flow into left sigmoid sinus. Contrast is present in the sigmoid sinus and jugular vein on the left on the CT venogram. Right sigmoid sinus is opacified. Cortical veins are within normal limits. Anatomic variants: None Review of the MIP images confirms the above findings IMPRESSION: 1. No emergent large vessel occlusion. 2. Delayed flow into the left sigmoid sinus. Contrast is present in the sigmoid sinus and jugular vein on the left on the CT venogram. No definitive etiology for delayed or slow flow is present. The CT venogram demonstrates no thrombus. 3. Atherosclerotic changes at the carotid bifurcations bilaterally and cavernous internal carotid arteries bilaterally without significant stenosis relative to the more distal vessels. 4. Bilateral high-grade stenoses at the origins of the vertebral arteries. The left vertebral artery is dominant. No tandem stenosis is present. 5. No significant proximal stenosis, aneurysm, or branch vessel occlusion within the Circle of Willis. 6. 13 mm hypodense nodule in the posteroinferior right lobe of the thyroid. Not clinically  significant; no follow-up imaging recommended (ref: J Am Coll Radiol. 2015 Feb;12(2): 143-50). Electronically  Signed   By: San Morelle M.D.   On: 12/08/2019 18:06   CT HEAD WO CONTRAST  Result Date: 12/08/2019 CLINICAL DATA:  Left-sided weakness today with headache. EXAM: CT HEAD WITHOUT CONTRAST TECHNIQUE: Contiguous axial images were obtained from the base of the skull through the vertex without intravenous contrast. COMPARISON:  MR brain 05/23/2016. Remote head CT 10/13/2010 unavailable. FINDINGS: Brain: There is no evidence of acute intracranial hemorrhage, mass lesion, brain edema or extra-axial fluid collection. Stable atrophy with prominence of the ventricles and subarachnoid spaces. Patchy low-density in the periventricular white matter appears similar to previous MR, likely due to chronic small vessel ischemic changes. There is no CT evidence of acute cortical infarction. Vascular: Intracranial vascular calcifications. No hyperdense vessel identified. Skull: Negative for fracture or focal lesion. Sinuses/Orbits: Progressive chronic mucoperiosteal thickening in the left division of the sphenoid sinus. The additional visualized paranasal sinuses, mastoid air cells and middle ears are clear. No orbital abnormalities are seen. Other: None. IMPRESSION: 1. No acute intracranial findings. Stable atrophy and chronic small vessel ischemic changes. 2. Progressive chronic left sphenoid sinus disease. Electronically Signed   By: Richardean Sale M.D.   On: 12/08/2019 13:48   CT Angio Neck W and/or Wo Contrast  Result Date: 12/08/2019 CLINICAL DATA:  Left-sided weakness today with headache. EXAM: CT ANGIOGRAPHY HEAD AND NECK TECHNIQUE: Multidetector CT imaging of the head and neck was performed using the standard protocol during bolus administration of intravenous contrast. Multiplanar CT image reconstructions and MIPs were obtained to evaluate the vascular anatomy. Carotid stenosis measurements (when applicable) are obtained utilizing NASCET criteria, using the distal internal carotid diameter as the  denominator. CONTRAST:  69mL OMNIPAQUE IOHEXOL 350 MG/ML SOLN COMPARISON:  CT head without contrast 12/08/2019 FINDINGS: CTA NECK FINDINGS Aortic arch: 3 vessel arch configuration is present. No significant stenosis is present at the great vessel origins. Right carotid system: The right common carotid artery is within normal limits. Atherosclerotic changes are present at the bifurcation without a significant stenosis relative to the more distal vessel. Cervical right ICA is within normal limits. Left carotid system: The left common carotid artery is within normal limits. Atherosclerotic calcifications are present at the left carotid bifurcation. No significant stenosis is present relative to the more distal vessel. Vertebral arteries: The left vertebral artery is the dominant vessel. Both vertebral arteries originate from the vertebra scratched at both vertebral arteries originate from the subclavian arteries. Bilateral high-grade stenoses are present at the origins. Skeleton: Multilevel degenerative changes are present in the cervical spine with endplate changes greatest at C5-6 and C6-7. No focal lytic or blastic lesions are present. Other neck: 13 mm hypodense nodule is present in posteroinferior right lobe of the thyroid. No significant cervical adenopathy is present. Soft tissues of the neck are otherwise unremarkable. Upper chest: Lung apices are clear. Scarring is present the lung apices. Lungs are otherwise clear. Thoracic inlet is within normal limits. Review of the MIP images confirms the above findings CTA HEAD FINDINGS Anterior circulation: Atherosclerotic calcifications are present in the cavernous internal carotid arteries bilaterally. No focal stenosis is present. ICA termini are within normal limits. The A1 and M1 segments are normal. The anterior communicating artery is patent. MCA bifurcations are within normal limits. The ACA and MCA branch vessels are within normal limits. Posterior circulation:  The left vertebral artery is dominant vessel. PICA origins are visualized and normal. Vertebrobasilar junction  is normal. The basilar artery is normal. Left posterior cerebral artery originates from basilar tip. Right posterior cerebral artery is fed by a P1 segment and posterior communicating artery. PCA branch vessels are within normal limits bilaterally. Venous sinuses: The right transverse sinus is dominant. Straight sinus deep cerebral veins are intact. There is delayed flow into left sigmoid sinus. Contrast is present in the sigmoid sinus and jugular vein on the left on the CT venogram. Right sigmoid sinus is opacified. Cortical veins are within normal limits. Anatomic variants: None Review of the MIP images confirms the above findings IMPRESSION: 1. No emergent large vessel occlusion. 2. Delayed flow into the left sigmoid sinus. Contrast is present in the sigmoid sinus and jugular vein on the left on the CT venogram. No definitive etiology for delayed or slow flow is present. The CT venogram demonstrates no thrombus. 3. Atherosclerotic changes at the carotid bifurcations bilaterally and cavernous internal carotid arteries bilaterally without significant stenosis relative to the more distal vessels. 4. Bilateral high-grade stenoses at the origins of the vertebral arteries. The left vertebral artery is dominant. No tandem stenosis is present. 5. No significant proximal stenosis, aneurysm, or branch vessel occlusion within the Circle of Willis. 6. 13 mm hypodense nodule in the posteroinferior right lobe of the thyroid. Not clinically significant; no follow-up imaging recommended (ref: J Am Coll Radiol. 2015 Feb;12(2): 143-50). Electronically Signed   By: San Morelle M.D.   On: 12/08/2019 18:06   CT VENOGRAM HEAD  Result Date: 12/08/2019 CLINICAL DATA:  [place]ipl[place]. EXAM: CT VENOGRAM HEAD TECHNIQUE: Delayed postcontrast images were performed. Coronal and sagittal reformatted images were created and  reviewed. CONTRAST:  80mL OMNIPAQUE IOHEXOL 350 MG/ML SOLN COMPARISON:  CTA head neck 12/08/2019 FINDINGS: Dural sinuses are patent. Straight sinus deep cerebral veins are intact. Cortical veins are unremarkable. Right transverse sinus is dominant. IMPRESSION: Negative CTV of the head. Electronically Signed   By: San Morelle M.D.   On: 12/08/2019 17:59    Procedures Procedures (including critical care time)  Medications Ordered in ED Medications  acetaminophen (TYLENOL) tablet 650 mg (650 mg Oral Refused 12/08/19 1727)  sodium chloride 0.9 % bolus 500 mL (0 mLs Intravenous Stopped 12/08/19 1801)  iohexol (OMNIPAQUE) 350 MG/ML injection 100 mL (75 mLs Intravenous Contrast Given 12/08/19 1645)    ED Course  I have reviewed the triage vital signs and the nursing notes.  Pertinent labs & imaging results that were available during my care of the patient were reviewed by me and considered in my medical decision making (see chart for details).  Clinical Course as of Dec 08 1906  Sun Dec 08, 2019  1825 D/w Lyn Records with neuro - she'll review imaging and get back to me with recs   [RD]    Clinical Course User Index [RD] Lucrezia Starch, MD   MDM Rules/Calculators/A&P                          86 year old male presents to ER with concern for headaches worse in morning, double vision.  On physical exam he has no neurologic deficits except for likely 3rd nerve palsy.  His left upper eyelid was droopy and he is unable to abduct his left eye.  CTA head and neck, CT venogram were negative for acute pathology.  I reviewed case in detail with neurology on-call, Dr. Einar Crow who recommends transfer to [facility], ER for MRI brain with and without contrast and MRI  orbits with and without contrast.  Dr. Malen Gauze who will be on-call this evening for neurology at [facility] was made aware of case.  He requests to be contacted once the imaging is obtained.  Patient will go POV with family member to  [facility].  Dr. Reather Converse has accepted ER to ER. On arrival, orders for MRI Brain w/wo and orbits w/wo should be placed. Neuro to be contacted after imaging.   Final Clinical Impression(s) / ED Diagnoses Final diagnoses:  Nonintractable headache, unspecified chronicity pattern, unspecified headache type  Double vision  Gaze palsy    Rx / DC Orders ED Discharge Orders    None       Lucrezia Starch, MD 12/08/19 [phone]

## 2019-12-08 NOTE — ED Triage Notes (Signed)
Headache, vision changes to left eye x 2 days. Pt denies weakness.

## 2019-12-08 NOTE — ED Provider Notes (Signed)
[facility] EMERGENCY DEPARTMENT Provider Note   CSN: [id number] Arrival date & time: 12/08/19  1139     History Chief Complaint  Patient presents with  . Headache    Charles Randall. is a 86 y.o. male.  Charles Randall is a 86 year old man who presents to the ED with 3 days of headache and left eyelid drooping.  He has a previous medical history significant for CHF, CKD and a distant history of seizures.  He reports that began having headaches and trouble with his vision about 3 days ago.  This trouble has been progressing.  He notes that his left eyelid has been dripping down of his left eye.  When his left eye remains covered he has no trouble seeing.  When he lifts up his eyelid he does have trouble focusing on objects and sees double.  He was initially assessed at [facility] emergency room at which time CTA head and neck ruled out any intracranial hemorrhage or aneurysm or thrombosis.        Past Medical History:  Diagnosis Date  . Anemia    STARTED PILLS 1 MONTH AGO NEW DX  . Aortic insufficiency    mild, moderate  . Arthritis    FINGERS SWELL PT THINKS ARTHITIS, TOP OF WRIST ALSO SWELLS ONCE IN A WHILE  . Bleeds easily ([place])    BRUISE EASY ALSO  . Chronic kidney disease   . Cough    LAST 3 TO 4 WEEKS PHLEGM YELLOW INTERMITTENT COUGH NO FEVER  . Dyslipidemia   . H/O dizziness    OCC   . H/O Doppler ultrasound 2010  . H/O echocardiogram 2013   aortic valve disorder, EF =>55%  . H/O echocardiogram 2012   AVD, EF =>55%  . Heart murmur   . History of hiatal hernia   . HTN (hypertension)   . Inguinal hernia   . Mitral regurgitation    mild  . Pneumonia FEW YEARS AGO   X 1  . Post-polio syndrome age 66  . Seizures ([place])    last sz 2016 NO CAUSE FOUND, 3 seizures on 03/25/18   . Swelling of both lower extremities    DOES NOT ALWAYS DO DOWN   . Tricuspid regurgitation    mild    Patient Active Problem List   Diagnosis Date Noted    . Acute diastolic CHF (congestive heart failure) ([place])   . History of hemiarthroplasty of left hip 02/13/2018  . Closed fracture of neck of left femur ([place]) 02/12/2018  . Moderate aortic regurgitation 02/12/2018  . CKD (chronic kidney disease), stage III ([place]) 02/12/2018  . Displaced fracture of left femoral neck ([place]) 02/12/2018  . Left displaced femoral neck fracture ([place]) 02/12/2018  . Arterial insufficiency of lower extremity ([place]) 11/22/2017  . Bilateral inguinal hernia (BIH) s/p lap hernia repair w mesh 08/17/2017 08/17/2017  . Femoral hernia - right - s/p lap hernia repair w mesh 08/17/2017 08/17/2017  . Dysphagia 04/06/2016  . Partial symptomatic epilepsy with complex partial seizures, not intractable, without status epilepticus ([place]) 11/16/2015  . Normocytic anemia 10/13/2015  . Seizure disorder ([place]) 05/12/2015  . Edema of lower extremity 04/09/2015  . Benign essential hypertension 04/09/2015  . Pleural effusion, right 04/09/2015  . 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 Memory loss 04/07/2015  . Mixed hyperlipidemia 04/07/2015  . Pain of left thumb 04/07/2015  . Simple partial seizure disorder ([place]) 04/07/2015  . Skin lesion of right arm 04/07/2015  .  Transient alteration of awareness 04/07/2015    Past Surgical History:  Procedure Laterality Date  . COLONSCOPY  5 YRS AGO  . DENTAL SURGERY  3-4 WEEKS AGO  . FEMORAL HERNIA REPAIR Right 08/17/2017   Procedure: HERNIA REPAIR FEMORAL LAPAROSCOPIC;  Surgeon: Michael Boston, MD;  Location: [facility] ORS;  Service: General;  Laterality: Right;  . INGUINAL HERNIA REPAIR Bilateral 08/17/2017   Procedure: LAPAROSCOPIC BILATERAL INGUINAL HERNIA REPAIR;  Surgeon: Michael Boston, MD;  Location: [facility]S;  Service: General;  Laterality: Bilateral;  . INSERTION OF MESH Bilateral 08/17/2017   Procedure: INSERTION OF MESH;  Surgeon: Michael Boston, MD;  Location: [facility];  Service: General;  Laterality: Bilateral;  . TOTAL HIP ARTHROPLASTY Left 02/13/2018    Procedure: HEMI  ARTHROPLASTY;  Surgeon: Paralee Cancel, MD;  Location: [facility] ORS;  Service: Orthopedics;  Laterality: Left;       Family History  Problem Relation Age of Onset  . Cancer Mother   . Heart disease Father   . Cancer Maternal Grandmother   . Cancer Maternal Grandfather   . Kidney failure Sister     Social History   Tobacco Use  . Smoking status: Former Smoker    Packs/day: 0.50    Years: 15.00    Pack years: 7.50    Quit date: 03/01/1963    Years since quitting: 56.8  . Smokeless tobacco: Never Used  Vaping Use  . Vaping Use: Never used  Substance Use Topics  . Alcohol use: No  . Drug use: No    Home Medications Prior to Admission medications   Medication Sig Start Date End Date Taking? Authorizing Provider  Ascorbic Acid (VITAMIN C) 1000 MG tablet Take 1,000 mg by mouth daily.    [provider]  aspirin EC 81 MG tablet Take 1 tablet (81 mg total) by mouth every other day. 11/21/18   Croitoru, Mihai, MD  carbamazepine (CARBATROL) 100 MG 12 hr capsule Take 1 capsule (100 mg total) by mouth 2 (two) times daily. 10/09/19   Lomax, Amy, NP  Cholecalciferol (VITAMIN D-3) 1000 UNITS CAPS Take 1,000 Units by mouth daily.     [provider]  ezetimibe (ZETIA) 10 MG tablet Take 1 tablet (10 mg total) by mouth daily. ON HOLD FOR RASH 02/13/19 02/13/19 05/14/19  Croitoru, Mihai, MD  imiquimod (ALDARA) 5 % cream APPLY ON THE SKIN AS DIRECTED EVERY OTHER NIGHT FOR 4 WEEKS 10/30/18   [provider]  levETIRAcetam (KEPPRA) 750 MG tablet Take 2 tablets (1,500 mg total) by mouth 2 (two) times daily. 10/09/19   Lomax, Amy, NP  lisinopril-hydrochlorothiazide (PRINZIDE,ZESTORETIC) 20-25 MG tablet Take 1 tablet by mouth daily.  05/24/17   [provider]  Magnesium 250 MG TABS Take 250 mg by mouth daily.    [provider]  triamcinolone ointment (KENALOG) 0.1 % APPLY OINTMENT TOPICALLY TWICE DAILY AS NEEDED FOR FLARES 07/03/18   [provider]  vitamin B-12 (CYANOCOBALAMIN) 1000 MCG tablet Take 1,000 mcg by mouth daily.    [provider]  vitamin E 400 UNIT capsule Take 400 Units by mouth daily.     [provider]    Allergies    Patient has no known allergies.  Review of Systems   Review of Systems  Constitutional: Negative for fatigue and fever.  HENT: Negative for congestion and sore throat.   Eyes: Positive for visual disturbance. Negative for pain and redness.  Respiratory: Negative for cough.   Cardiovascular: Negative for chest  pain and palpitations.  Gastrointestinal: Negative for diarrhea, nausea and vomiting.  Genitourinary: Negative for dysuria.  Musculoskeletal: Negative for arthralgias.  Skin: Negative for rash.  Neurological: Positive for headaches. Negative for tremors, seizures, speech difficulty, weakness and numbness.  Psychiatric/Behavioral: Negative for behavioral problems and confusion.    Physical Exam Updated Vital Signs BP (!) 157/68   Pulse (!) 50   Temp 97.9 F (36.6 C) (Oral)   Resp 12   Ht 5\' 6"  (1.676 m)   Wt 62.7 kg   SpO2 100%   BMI 22.31 kg/m   Physical Exam Constitutional:      General: He is not in acute distress.    Appearance: He is well-developed and normal weight. He is not ill-appearing.  HENT:     Head: Normocephalic and atraumatic.     Mouth/Throat:     Mouth: Mucous membranes are moist.     Pharynx: Oropharynx is clear.  Eyes:     Extraocular Movements:     Right eye: Normal extraocular motion and no nystagmus.     Left eye: Abnormal extraocular motion present. No nystagmus.     Pupils: Pupils are equal, round, and reactive to light.     Right eye: Pupil is round and reactive.     Left eye: Pupil is round and reactive.     Comments: Significant double vision with retraction of the left eyelid.  No double vision when left eye was left in place. Left eye with lateral gaze.   Cardiovascular:     Rate and Rhythm: Normal rate and  regular rhythm.  Pulmonary:     Effort: Pulmonary effort is normal.     Breath sounds: Normal breath sounds.  Abdominal:     General: Bowel sounds are normal.     Palpations: Abdomen is soft.  Musculoskeletal:        General: Normal range of motion.     Cervical back: Normal range of motion and neck supple. No rigidity.  Lymphadenopathy:     Cervical: No cervical adenopathy.  Skin:    General: Skin is warm and dry.     Capillary Refill: Capillary refill takes less than 2 seconds.  Neurological:     Mental Status: He is alert and oriented to person, place, and time. Mental status is at baseline.     GCS: GCS eye subscore is 4. GCS verbal subscore is 5. GCS motor subscore is 6.     Cranial Nerves: Cranial nerve deficit present. No dysarthria.     ED Results / Procedures / Treatments   Labs (all labs ordered are listed, but only abnormal results are displayed) Labs Reviewed  APTT - Abnormal; Notable for the following components:      Result Value   aPTT 38 (*)    All other components within normal limits  CBC - Abnormal; Notable for the following components:   RBC 3.97 (*)    Hemoglobin 12.3 (*)    HCT 37.3 (*)    All other components within normal limits  DIFFERENTIAL - Abnormal; Notable for the following components:   Lymphs Abs 0.6 (*)    All other components within normal limits  COMPREHENSIVE METABOLIC PANEL - Abnormal; Notable for the following components:   Glucose, Bld 108 (*)    BUN 34 (*)    Creatinine, Ser 1.57 (*)    GFR, Estimated 38 (*)    All other components within normal limits  PROTIME-INR    EKG EKG Interpretation  Date/Time:  Sunday December 08 2019 12:00:03 EDT Ventricular Rate:  63 PR Interval:  234 QRS Duration: 76 QT Interval:  396 QTC Calculation: 405 R Axis:     Text Interpretation: Sinus rhythm with 1st degree A-V block with Premature atrial complexes Otherwise normal ECG Confirmed by Madalyn Rob [phone]) on 12/08/2019 3:42:54  PM   Radiology CT Angio Head W or Wo Contrast  Result Date: 12/08/2019 CLINICAL DATA:  Left-sided weakness today with headache. EXAM: CT ANGIOGRAPHY HEAD AND NECK TECHNIQUE: Multidetector CT imaging of the head and neck was performed using the standard protocol during bolus administration of intravenous contrast. Multiplanar CT image reconstructions and MIPs were obtained to evaluate the vascular anatomy. Carotid stenosis measurements (when applicable) are obtained utilizing NASCET criteria, using the distal internal carotid diameter as the denominator. CONTRAST:  74mL OMNIPAQUE IOHEXOL 350 MG/ML SOLN COMPARISON:  CT head without contrast 12/08/2019 FINDINGS: CTA NECK FINDINGS Aortic arch: 3 vessel arch configuration is present. No significant stenosis is present at the great vessel origins. Right carotid system: The right common carotid artery is within normal limits. Atherosclerotic changes are present at the bifurcation without a significant stenosis relative to the more distal vessel. Cervical right ICA is within normal limits. Left carotid system: The left common carotid artery is within normal limits. Atherosclerotic calcifications are present at the left carotid bifurcation. No significant stenosis is present relative to the more distal vessel. Vertebral arteries: The left vertebral artery is the dominant vessel. Both vertebral arteries originate from the vertebra scratched at both vertebral arteries originate from the subclavian arteries. Bilateral high-grade stenoses are present at the origins. Skeleton: Multilevel degenerative changes are present in the cervical spine with endplate changes greatest at C5-6 and C6-7. No focal lytic or blastic lesions are present. Other neck: 13 mm hypodense nodule is present in posteroinferior right lobe of the thyroid. No significant cervical adenopathy is present. Soft tissues of the neck are otherwise unremarkable. Upper chest: Lung apices are clear. Scarring is  present the lung apices. Lungs are otherwise clear. Thoracic inlet is within normal limits. Review of the MIP images confirms the above findings CTA HEAD FINDINGS Anterior circulation: Atherosclerotic calcifications are present in the cavernous internal carotid arteries bilaterally. No focal stenosis is present. ICA termini are within normal limits. The A1 and M1 segments are normal. The anterior communicating artery is patent. MCA bifurcations are within normal limits. The ACA and MCA branch vessels are within normal limits. Posterior circulation: The left vertebral artery is dominant vessel. PICA origins are visualized and normal. Vertebrobasilar junction is normal. The basilar artery is normal. Left posterior cerebral artery originates from basilar tip. Right posterior cerebral artery is fed by a P1 segment and posterior communicating artery. PCA branch vessels are within normal limits bilaterally. Venous sinuses: The right transverse sinus is dominant. Straight sinus deep cerebral veins are intact. There is delayed flow into left sigmoid sinus. Contrast is present in the sigmoid sinus and jugular vein on the left on the CT venogram. Right sigmoid sinus is opacified. Cortical veins are within normal limits. Anatomic variants: None Review of the MIP images confirms the above findings IMPRESSION: 1. No emergent large vessel occlusion. 2. Delayed flow into the left sigmoid sinus. Contrast is present in the sigmoid sinus and jugular vein on the left on the CT venogram. No definitive etiology for delayed or slow flow is present. The CT venogram demonstrates no thrombus. 3. Atherosclerotic changes at the carotid bifurcations bilaterally and cavernous internal carotid arteries  bilaterally without significant stenosis relative to the more distal vessels. 4. Bilateral high-grade stenoses at the origins of the vertebral arteries. The left vertebral artery is dominant. No tandem stenosis is present. 5. No significant  proximal stenosis, aneurysm, or branch vessel occlusion within the Circle of Willis. 6. 13 mm hypodense nodule in the posteroinferior right lobe of the thyroid. Not clinically significant; no follow-up imaging recommended (ref: J Am Coll Radiol. 2015 Feb;12(2): 143-50). Electronically Signed   By: San Morelle M.D.   On: 12/08/2019 18:06   CT HEAD WO CONTRAST  Result Date: 12/08/2019 CLINICAL DATA:  Left-sided weakness today with headache. EXAM: CT HEAD WITHOUT CONTRAST TECHNIQUE: Contiguous axial images were obtained from the base of the skull through the vertex without intravenous contrast. COMPARISON:  MR brain 05/23/2016. Remote head CT 10/13/2010 unavailable. FINDINGS: Brain: There is no evidence of acute intracranial hemorrhage, mass lesion, brain edema or extra-axial fluid collection. Stable atrophy with prominence of the ventricles and subarachnoid spaces. Patchy low-density in the periventricular white matter appears similar to previous MR, likely due to chronic small vessel ischemic changes. There is no CT evidence of acute cortical infarction. Vascular: Intracranial vascular calcifications. No hyperdense vessel identified. Skull: Negative for fracture or focal lesion. Sinuses/Orbits: Progressive chronic mucoperiosteal thickening in the left division of the sphenoid sinus. The additional visualized paranasal sinuses, mastoid air cells and middle ears are clear. No orbital abnormalities are seen. Other: None. IMPRESSION: 1. No acute intracranial findings. Stable atrophy and chronic small vessel ischemic changes. 2. Progressive chronic left sphenoid sinus disease. Electronically Signed   By: Richardean Sale M.D.   On: 12/08/2019 13:48   CT Angio Neck W and/or Wo Contrast  Result Date: 12/08/2019 CLINICAL DATA:  Left-sided weakness today with headache. EXAM: CT ANGIOGRAPHY HEAD AND NECK TECHNIQUE: Multidetector CT imaging of the head and neck was performed using the standard protocol during  bolus administration of intravenous contrast. Multiplanar CT image reconstructions and MIPs were obtained to evaluate the vascular anatomy. Carotid stenosis measurements (when applicable) are obtained utilizing NASCET criteria, using the distal internal carotid diameter as the denominator. CONTRAST:  41mL OMNIPAQUE IOHEXOL 350 MG/ML SOLN COMPARISON:  CT head without contrast 12/08/2019 FINDINGS: CTA NECK FINDINGS Aortic arch: 3 vessel arch configuration is present. No significant stenosis is present at the great vessel origins. Right carotid system: The right common carotid artery is within normal limits. Atherosclerotic changes are present at the bifurcation without a significant stenosis relative to the more distal vessel. Cervical right ICA is within normal limits. Left carotid system: The left common carotid artery is within normal limits. Atherosclerotic calcifications are present at the left carotid bifurcation. No significant stenosis is present relative to the more distal vessel. Vertebral arteries: The left vertebral artery is the dominant vessel. Both vertebral arteries originate from the vertebra scratched at both vertebral arteries originate from the subclavian arteries. Bilateral high-grade stenoses are present at the origins. Skeleton: Multilevel degenerative changes are present in the cervical spine with endplate changes greatest at C5-6 and C6-7. No focal lytic or blastic lesions are present. Other neck: 13 mm hypodense nodule is present in posteroinferior right lobe of the thyroid. No significant cervical adenopathy is present. Soft tissues of the neck are otherwise unremarkable. Upper chest: Lung apices are clear. Scarring is present the lung apices. Lungs are otherwise clear. Thoracic inlet is within normal limits. Review of the MIP images confirms the above findings CTA HEAD FINDINGS Anterior circulation: Atherosclerotic calcifications are present in the cavernous  internal carotid arteries  bilaterally. No focal stenosis is present. ICA termini are within normal limits. The A1 and M1 segments are normal. The anterior communicating artery is patent. MCA bifurcations are within normal limits. The ACA and MCA branch vessels are within normal limits. Posterior circulation: The left vertebral artery is dominant vessel. PICA origins are visualized and normal. Vertebrobasilar junction is normal. The basilar artery is normal. Left posterior cerebral artery originates from basilar tip. Right posterior cerebral artery is fed by a P1 segment and posterior communicating artery. PCA branch vessels are within normal limits bilaterally. Venous sinuses: The right transverse sinus is dominant. Straight sinus deep cerebral veins are intact. There is delayed flow into left sigmoid sinus. Contrast is present in the sigmoid sinus and jugular vein on the left on the CT venogram. Right sigmoid sinus is opacified. Cortical veins are within normal limits. Anatomic variants: None Review of the MIP images confirms the above findings IMPRESSION: 1. No emergent large vessel occlusion. 2. Delayed flow into the left sigmoid sinus. Contrast is present in the sigmoid sinus and jugular vein on the left on the CT venogram. No definitive etiology for delayed or slow flow is present. The CT venogram demonstrates no thrombus. 3. Atherosclerotic changes at the carotid bifurcations bilaterally and cavernous internal carotid arteries bilaterally without significant stenosis relative to the more distal vessels. 4. Bilateral high-grade stenoses at the origins of the vertebral arteries. The left vertebral artery is dominant. No tandem stenosis is present. 5. No significant proximal stenosis, aneurysm, or branch vessel occlusion within the Circle of Willis. 6. 13 mm hypodense nodule in the posteroinferior right lobe of the thyroid. Not clinically significant; no follow-up imaging recommended (ref: J Am Coll Radiol. 2015 Feb;12(2): 143-50).  Electronically Signed   By: San Morelle M.D.   On: 12/08/2019 18:06   CT VENOGRAM HEAD  Result Date: 12/08/2019 CLINICAL DATA:  Diplopia. EXAM: CT VENOGRAM HEAD TECHNIQUE: Delayed postcontrast images were performed. Coronal and sagittal reformatted images were created and reviewed. CONTRAST:  38mL OMNIPAQUE IOHEXOL 350 MG/ML SOLN COMPARISON:  CTA head neck 12/08/2019 FINDINGS: Dural sinuses are patent. Straight sinus deep cerebral veins are intact. Cortical veins are unremarkable. Right transverse sinus is dominant. IMPRESSION: Negative CTV of the head. Electronically Signed   By: San Morelle M.D.   On: 12/08/2019 17:59    Procedures Procedures (including critical care time)  Medications Ordered in ED Medications  acetaminophen (TYLENOL) tablet 650 mg (650 mg Oral Refused 12/08/19 1727)  sodium chloride 0.9 % bolus 500 mL (0 mLs Intravenous Stopped 12/08/19 1801)  iohexol (OMNIPAQUE) 350 MG/ML injection 100 mL (75 mLs Intravenous Contrast Given 12/08/19 1645)  gadobutrol (GADAVIST) 1 MMOL/ML injection 6.5 mL (6.5 mLs Intravenous Contrast Given 12/08/19 2244)    ED Course  I have reviewed the triage vital signs and the nursing notes.  Pertinent labs & imaging results that were available during my care of the patient were reviewed by me and considered in my medical decision making (see chart for details).  Clinical Course as of Dec 07 2325  Sun Dec 08, 2019  1825 D/w Lyn Records with neuro - she'll review imaging and get back to me with recs   [RD]    Clinical Course User Index [RD] Lucrezia Starch, MD   MDM Rules/Calculators/A&P                          Mr. Tomasetti is a 86 year old man who  presents to the ED with 3 days of headache and left eyelid drooping.  He has a previous medical history significant for CHF, CKD and a distant history of seizures.  CTA head neck showed no evidence of hemorrhage or aneurysm.  CTV showed no evidence of thrombosis.  He was then  transferred to [facility] where MRI showed no concerning lesions or masses.  The case was discussed with Dr. Malen Gauze.  Based on the imaging, most likely related to microvascular ischemia.  No further imaging or work-up is necessary at this time.  Good prognosis.  Mr. Fedewa was informed of this information was encouraged to continue taking a baby aspirin once daily and to follow-up with neurology in 2 weeks.   Final Clinical Impression(s) / ED Diagnoses Final diagnoses:  Nonintractable headache, unspecified chronicity pattern, unspecified headache type  Double vision  Gaze palsy    Rx / DC Orders ED Discharge Orders    None       Matilde Haymaker, MD 12/09/19 4163    Elnora Morrison, MD 12/09/19 [id number]

## 2019-12-08 NOTE — ED Notes (Signed)
Left at this time POV heading to [facility].  IV wrapped with Coban.

## 2019-12-08 NOTE — ED Notes (Signed)
ED Provider at bedside. 

## 2019-12-08 NOTE — ED Notes (Signed)
In room to medicate patient.  Reports his head is not currently hurting and he does not need anything.  Taken to CT at this time.

## 2019-12-09 NOTE — Discharge Instructions (Signed)
Your left drooping eyelid appears to be from a 3rd nerve palsy.  This is likely from some ischemic injury (small stroke) to the nerve goes to the muscles around her eye.  This is also likely the reason for your occasional headaches.  The imaging we got in the hospital today show that there is no evidence of intracranial aneurysms or masses.  No further interventions are needed at this time.  For now, continue taking your aspirin 81 mg daily until you have a chance to follow-up with your neurologist.  Please make an effort to follow-up with your neurologist in the next 1-2 weeks.  As far as I understand, you will likely experience good resolution of your symptoms in the next month or so.

## 2020-01-06 ENCOUNTER — Ambulatory Visit: Payer: Medicare Other | Admitting: Diagnostic Neuroimaging

## 2020-01-27 ENCOUNTER — Encounter: Payer: Self-pay | Admitting: Diagnostic Neuroimaging

## 2020-01-27 ENCOUNTER — Other Ambulatory Visit: Payer: Self-pay

## 2020-01-27 ENCOUNTER — Ambulatory Visit (INDEPENDENT_AMBULATORY_CARE_PROVIDER_SITE_OTHER): Payer: Medicare Other | Admitting: Diagnostic Neuroimaging

## 2020-01-27 VITALS — BP 135/68 | HR 70 | Ht 66.0 in | Wt 139.4 lb

## 2020-01-27 DIAGNOSIS — H4902 Third [oculomotor] nerve palsy, left eye: Secondary | ICD-10-CM

## 2020-01-27 DIAGNOSIS — I6389 Other cerebral infarction: Secondary | ICD-10-CM | POA: Diagnosis not present

## 2020-01-27 DIAGNOSIS — R6889 Other general symptoms and signs: Secondary | ICD-10-CM | POA: Diagnosis not present

## 2020-01-27 DIAGNOSIS — Z79899 Other long term (current) drug therapy: Secondary | ICD-10-CM | POA: Diagnosis not present

## 2020-01-27 MED ORDER — CARBAMAZEPINE ER 100 MG PO CP12: 100.0000 mg | ORAL_CAPSULE | Freq: Two times a day (BID) | ORAL | 4 refills | Status: AC

## 2020-01-27 MED ORDER — LEVETIRACETAM 750 MG PO TABS: 1500.0000 mg | ORAL_TABLET | Freq: Two times a day (BID) | ORAL | 4 refills | Status: AC

## 2020-01-27 NOTE — Progress Notes (Signed)
[facility]  PATIENT: Charles Randall. DOB: 1927-03-12  REFERRING CLINICIAN:  HISTORY FROM: patient and SO REASON FOR VISIT: follow up   HISTORICAL  CHIEF COMPLAINT:  Chief Complaint  Patient presents with  . Microvascular ischemia    rm 6 ED referral, wife- Charles Randall "went to ED in Oct for headache, double vision"    HISTORY OF PRESENT ILLNESS:   UPDATE (01/27/20, VRP): Since last visit, doing well until 12/08/19; woke up witrh left eyelid ptosis and headaches. 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  Since then symptoms are stable.  No significant improvement.  Regarding seizures patient is stable and no recurrence.  Tolerating medications.  UPDATE (04/04/18, VRP): Since last visit, was doing well until 03/23/18 and 03/25/18 (multiple breakthrough seizures). Was 1 month post-op from surgery (hip fracture). Now stable. No other triggering factors. Tolerating levetiracetam 1500mg  twice a day.   PCP discontinued aspirin 81mg  daily in March 2019 (? Age or bleeding risk).   UPDATE (09/04/2017, CM): Charles Randall, 86 year old male returns for follow-up with history of seizure disorder /TIA.He is currently on aspirin for secondary stroke prevention without further TIA symptoms .He remains on Keppra 750 mg 2 tablets twice daily he denies any side effects to his medication.  He ambulates with a rolling walker.  No recent falls.  He returns for reevaluation.  UPDATE 08/30/16 VRP: Since last visit, doing well. No more TIA or seizures. Tolerating medications.   UPDATE 05/23/16: Since last visit, was doing well until 2 weeks ago, had 10 min episode of right face drooping, slurred speech, right hand tingling (~05/02/16). Then resolved. Did not seek medical attention. Saw Dr. Hollie Salk, and she referred patient to see [place]. No other alleviating or aggravating factors.  UPDATE 11/16/15: Since last visit, doing well. No definite seizures. Tolerating LEV  1500mg  BID. Patient not driving, due to concern from his friend about poss of un-witnessed seizures. No indication of this, but rather the theoretical possibility.   UPDATE 05/12/15: Since last visit, still having 1-2 seizures per year (staring spells; mouth automatisms) --> 2014 x 1; 2015 x 2; 2016 x 2. Still with interrupted sleep. Tolerating LEV 1000mg  BID.  UPDATE 05/29/12: Doing well. No seizures. Tolerating medications.  UPDATE 11/29/11: Has not had any seizure episodes. Tolerating LEV 1000mg  BID, tolerating well without mood changes or increased dizziness. No complaints today. He has been driving when he absolutely has no other way.   UPDATE 08/29/11: He has had 2 seizures on 08/11/11. Were similar to his previous seizures. Denies bowel/bladder incontinence. Denies sleep changes, missed doses, fever or poor food/fluid intake. He was increased to 1000mg  BID, tolerating well without dizziness or mood changes.   UPDATE 10/18/10: Had another spell on 10/13/10. Woke up normally, read paper. Then around 9am, was found by girlfried, slumped against wall in bedroom, with blood from mouth. Taken to ER by EMS. He was somewhat combative and confused for 2-3 hours. CT head and neck unremarkable. Started on LEV 500mg  BID. No further events. Family reports poor sleep patterns, snoring and daytime sleepiness. Sleeps 5 hrs per night, tossing and turning. Able to fall asleep very easily.  UPDATE 06/30/10: No further events. Last spell 03/15/10. Not driving. Feels good. Testing reviewed.  PRIOR HPI: 86 yo right-handed Caucasian male with a past medical history of HTN, hyperlipidemia, post-polio syndrome, vitamin D deficiency, osteoporosis with history of compression fracture, left inguinal hernia, and eczema who presents for  evaluation for possible atypical seizures. He is accompanied today by his long-term girlfriend who helps provide details regarding these episodes. Starting about 2-3 years ago  and occurring about every 6 months, the patient has had spells where he will "blank out" for about 20-30 minutes. He reports complete amnesia during the spells, but denies any other symptoms occurring during or after the episodes. Other than a possible "warning surge" in his head immediately prior to the onset of the episodes, he denies any symptoms preceding the episodes. He has a hard time describing what the "warning surge" is. His girlfriend has noticed that during the episodes he has a "blank stare," "his face gets really white," and he will ask many questions over and over such as: "Where are we?" and "What are we doing here?" She denies any other signs, including loss of postural control, loss of bowel or bladder continence, orofacial twitching, tongue biting, etc. Immediately following the episodes, the patient is back to his normal self. 08/2008 to 09/2008: MRI brain w/ and w/out, MRA w/out, carotid dopplers, EEG, and neurocognitive battery all unrevealing. 10-hour ambulatory EEG on 12/2009 which was negative. Most recent episode: March 15, 2010 around 12:30 PM, and was different from previous four episodes. The girlfriend found him "slumped down" in a chair with his legs and arms "as stiff as a board" (arms flexed at elbow, legs flexed at knees, eyes closed). She was able to rouse him after about 3-4 minutes, and he reports being "as weak as water" afterward, as well as very tired, which led him to sleep for the rest of the day.   REVIEW OF SYSTEMS: Full 14 system review of systems performed and negative with exception of: joint pain dizziness eye itching.   ALLERGIES: No Known Allergies  HOME MEDICATIONS: Outpatient Medications Prior to Visit  Medication Sig Dispense Refill  . Ascorbic Acid (VITAMIN C) 1000 MG tablet Take 1,000 mg by mouth daily.    Marland Kitchen aspirin EC 81 MG tablet Take 1 tablet (81 mg total) by mouth every other day. 90 tablet 3  . carbamazepine (CARBATROL) 100 MG 12 hr capsule  Take 1 capsule (100 mg total) by mouth 2 (two) times daily. 180 capsule 4  . Cholecalciferol (VITAMIN D-3) 1000 UNITS CAPS Take 1,000 Units by mouth daily.     . imiquimod (ALDARA) 5 % cream APPLY ON THE SKIN AS DIRECTED EVERY OTHER NIGHT FOR 4 WEEKS    . levETIRAcetam (KEPPRA) 750 MG tablet Take 2 tablets (1,500 mg total) by mouth 2 (two) times daily. 360 tablet 4  . lisinopril-hydrochlorothiazide (PRINZIDE,ZESTORETIC) 20-25 MG tablet Take 1 tablet by mouth daily.   0  . Magnesium 250 MG TABS Take 250 mg by mouth daily.    Marland Kitchen triamcinolone ointment (KENALOG) 0.1 % APPLY OINTMENT TOPICALLY TWICE DAILY AS NEEDED FOR FLARES    . vitamin B-12 (CYANOCOBALAMIN) 1000 MCG tablet Take 1,000 mcg by mouth daily.    . vitamin E 400 UNIT capsule Take 400 Units by mouth daily.     Marland Kitchen ezetimibe (ZETIA) 10 MG tablet Take 1 tablet (10 mg total) by mouth daily. ON HOLD FOR RASH 02/13/19 30 tablet 11   No facility-administered medications prior to visit.    PAST MEDICAL HISTORY: Past Medical History:  Diagnosis Date  . Anemia    STARTED PILLS 1 MONTH AGO NEW DX  . Aortic insufficiency    mild, moderate  . Arthritis    FINGERS SWELL PT THINKS ARTHITIS, TOP OF WRIST  ALSO SWELLS ONCE IN A WHILE  . Bleeds easily ([place])    BRUISE EASY ALSO  . Chronic kidney disease   . Cough    LAST 3 TO 4 WEEKS PHLEGM YELLOW INTERMITTENT COUGH NO FEVER  . Dyslipidemia   . H/O dizziness    OCC   . H/O Doppler ultrasound 2010  . H/O echocardiogram 2013   aortic valve disorder, EF =>55%  . H/O echocardiogram 2012   AVD, EF =>55%  . Heart murmur   . History of hiatal hernia   . HTN (hypertension)   . Inguinal hernia   . Mitral regurgitation    mild  . Pneumonia FEW YEARS AGO   X 1  . Post-polio syndrome age 76  . Seizures ([place])    last sz 2016 NO CAUSE FOUND, 3 seizures on 03/25/18   . Swelling of both lower extremities    DOES NOT ALWAYS DO DOWN   . Tricuspid regurgitation    mild    PAST SURGICAL  HISTORY: Past Surgical History:  Procedure Laterality Date  . COLONSCOPY  5 YRS AGO  . DENTAL SURGERY  3-4 WEEKS AGO  . FEMORAL HERNIA REPAIR Right 08/17/2017   Procedure: HERNIA REPAIR FEMORAL LAPAROSCOPIC;  Surgeon: Michael Boston, MD;  Location: [facility];  Service: General;  Laterality: Right;  . INGUINAL HERNIA REPAIR Bilateral 08/17/2017   Procedure: LAPAROSCOPIC BILATERAL INGUINAL HERNIA REPAIR;  Surgeon: Michael Boston, MD;  Location: [facility];  Service: General;  Laterality: Bilateral;  . INSERTION OF MESH Bilateral 08/17/2017   Procedure: INSERTION OF MESH;  Surgeon: Michael Boston, MD;  Location: [facility];  Service: General;  Laterality: Bilateral;  . TOTAL HIP ARTHROPLASTY Left 02/13/2018   Procedure: HEMI  ARTHROPLASTY;  Surgeon: Paralee Cancel, MD;  Location: [facility];  Service: Orthopedics;  Laterality: Left;    FAMILY HISTORY: Family History  Problem Relation Age of Onset  . Cancer Mother   . Heart disease Father   . Cancer Maternal Grandmother   . Cancer Maternal Grandfather   . Kidney failure Sister     SOCIAL HISTORY:  Social History   Socioeconomic History  . Marital status: Unknown    Spouse name: Not on file  . Number of children: 2  . Years of education: BA [employer]  . Highest education level: Not on file  Occupational History  . Occupation: Retired  Tobacco Use  . Smoking status: Former Smoker    Packs/day: 0.50    Years: 15.00    Pack years: 7.50    Quit date: 03/01/1963    Years since quitting: 56.9  . Smokeless tobacco: Never Used  Vaping Use  . Vaping Use: Never used  Substance and Sexual Activity  . Alcohol use: No  . Drug use: No  . Sexual activity: Not on file  Other Topics Concern  . Not on file  Social History Narrative   86 year old right-handed male who lives at home alone.  He is single (has long-term girlfriend) and has two children.    He drinks one cup caffeinated coffee daily.   Social Determinants of Health   Financial Resource Strain:    . Difficulty of Paying Living Expenses: Not on file  Food Insecurity:   . Worried About Charity fundraiser in the Last Year: Not on file  . Ran Out of Food in the Last Year: Not on file  Transportation Needs:   . Lack of Transportation (Medical): Not on file  . Lack  of Transportation (Non-Medical): Not on file  Physical Activity:   . Days of Exercise per Week: Not on file  . Minutes of Exercise per Session: Not on file  Stress:   . Feeling of Stress : Not on file  Social Connections:   . Frequency of Communication with Friends and Family: Not on file  . Frequency of Social Gatherings with Friends and Family: Not on file  . Attends Religious Services: Not on file  . Active Member of Clubs or Organizations: Not on file  . Attends Archivist Meetings: Not on file  . Marital Status: Not on file  Intimate Partner Violence:   . Fear of Current or Ex-Partner: Not on file  . Emotionally Abused: Not on file  . Physically Abused: Not on file  . Sexually Abused: Not on file     PHYSICAL EXAM  GENERAL EXAM/CONSTITUTIONAL: Vitals:  Vitals:   01/27/20 1428  BP: 135/68  Pulse: 70  Weight: 139 lb 6.4 oz (63.2 kg)  Height: 5\' 6"  (1.676 m)   Body mass index is 22.5 kg/m. No exam data present  Patient is in no distress; well developed, nourished and groomed; neck is supple  CARDIOVASCULAR:  Examination of carotid arteries is normal; no carotid bruits  Regular rate and rhythm, no murmurs  Examination of peripheral vascular system by observation and palpation is NOTABLE FOR DUSKY FEET AND VENOUS STASIS BILATERALLY  EYES:  Ophthalmoscopic exam of optic discs and posterior segments is normal; no papilledema or hemorrhages  MUSCULOSKELETAL:  Gait, strength, tone, movements noted in Neurologic exam below  NEUROLOGIC: MENTAL STATUS:  No flowsheet data found.  awake, alert, oriented to person, place and time  recent and remote memory intact  normal attention and  concentration  language fluent, comprehension intact, naming intact,   fund of knowledge appropriate  CRANIAL NERVE:   2nd - no papilledema on fundoscopic exam  2nd, 3rd, 4th, 6th - RIGHT PUPIL REACTIVE; LEFT PUPIL 5MM NO REACTION; LEFT EYE DEVIATED LATERALLY AND SLIGHTLY INFERIORLY; MINIMAL MOVEMENT MEDIALLY AND SUPERIORLY; LEFT PTOSIS (COMPLETE)  5th - facial sensation symmetric  7th - facial strength symmetric  8th - hearing intact  9th - palate elevates symmetrically, uvula midline  11th - shoulder shrug symmetric  12th - tongue protrusion midline  MOTOR:   normal bulk and tone, full strength in the BUE, BLE; EXCEPT FOR BILATERAL FOOT DROP (RIGHT 3, LEFT 2-3)  LEFT HIP FLEXION 2-3  SENSORY:   normal and symmetric to light touch, temperature, vibration  COORDINATION:   finger-nose-finger, fine finger movements normal  REFLEXES:   deep tendon reflexes --> BUE 1; BLE 0  GAIT/STATION:   narrow based gait; USES WALKER; SLIGHTLY UNSTEADY; CANNOT WALK ON HEELS OR TOES (FOOT DROP RELATED TO POLIO SYNDROME)    DIAGNOSTIC DATA (LABS, IMAGING, TESTING) - I reviewed patient records, labs, notes, testing and imaging myself where available.  Lab Results  Component Value Date   WBC 5.3 12/08/2019   HGB 12.3 (L) 12/08/2019   HCT 37.3 (L) 12/08/2019   MCV 94.0 12/08/2019   PLT 196 12/08/2019      Component Value Date/Time   NA 136 12/08/2019 1210   K 4.2 12/08/2019 1210   CL 100 12/08/2019 1210   CO2 28 12/08/2019 1210   GLUCOSE 108 (H) 12/08/2019 1210   BUN 34 (H) 12/08/2019 1210   CREATININE 1.57 (H) 12/08/2019 1210   CREATININE 2.11 (H) 02/16/2016 1144   CALCIUM 9.0 12/08/2019 1210  PROT 7.1 12/08/2019 1210   ALBUMIN 3.9 12/08/2019 1210   AST 22 12/08/2019 1210   ALT 13 12/08/2019 1210   ALKPHOS 114 12/08/2019 1210   BILITOT 0.4 12/08/2019 1210   GFRNONAA 38 (L) 12/08/2019 1210   GFRAA 53 (L) 02/19/2018 1508   Lab Results  Component Value Date    CHOL 176 01/30/2019   HDL 75 01/30/2019   LDLCALC 86 01/30/2019   TRIG 82 01/30/2019   CHOLHDL 2.3 01/30/2019   No results found for: HGBA1C Lab Results  Component Value Date   JSEGBTDV76 160 02/15/2018   No results found for: TSH  10/17/14 lipid panel: CHOL 200, TRIG 91, HDL 65, LDL 117  05/04/10 EEG - normal  05/11/10 MRI BRAIN  - mild chronic small vessel ischemic disease.  03/04/16 TTE - Left ventricle: The cavity size was normal. Wall thickness was   normal. Systolic function was normal. The estimated ejection   fraction was in the range of 60% to 65%. Wall motion was normal;   there were no regional wall motion abnormalities. Features are   consistent with a pseudonormal left ventricular filling pattern,   with concomitant abnormal relaxation and increased filling   pressure (grade 2 diastolic dysfunction). - Aortic valve: Moderately calcified annulus. Moderately thickened,   moderately calcified leaflets. There was moderate regurgitation. - Mitral valve: There was mild regurgitation. - Pulmonary arteries: Systolic pressure was moderately increased.   PA peak pressure: 44 mm Hg (S).   04/08/16 EKG [I reviewed images myself and agree with interpretation. -VRP]  - normal sinus rhythm    05/20/16 carotid u/s - Minimal to moderate amount of bilateral atherosclerotic plaque, left greater than right, unchanged to minimally progressed compared to the 08/2008 examination though again not resulting in elevated peak systolic velocities within either internal carotid artery.   05/23/16 MRI brain (without) [I reviewed images myself and agree with interpretation. -VRP] 1. Mild perisylvian and temporal atrophy.  2. Mild periventricular and subcortical chronic small vessel ischemic disease.  3. No acute findings. 4. Compared to MRI on 09/18/08, there has been progression of atrophy and chronic small vessel ischemic disease.    12/08/19 MRI brain / orbits 1. No acute intracranial  abnormality. 2. Age-related cerebral atrophy with mild-to-moderate chronic small vessel ischemic disease, with a few scattered remote bilateral cerebellar infarcts. 3. Normal MRI of the orbits. No findings to explain patient's symptoms identified. 4. Acute left sphenoid sinusitis.   12/08/19 CT head, CTA head / neck, CTV head  1. No emergent large vessel occlusion. 2. Delayed flow into the left sigmoid sinus. Contrast is present in the sigmoid sinus and jugular vein on the left on the CT venogram. No definitive etiology for delayed or slow flow is present. The CT venogram demonstrates no thrombus. 3. Atherosclerotic changes at the carotid bifurcations bilaterally and cavernous internal carotid arteries bilaterally without significant stenosis relative to the more distal vessels. 4. Bilateral high-grade stenoses at the origins of the vertebral arteries. The left vertebral artery is dominant. No tandem stenosis is present. 5. No significant proximal stenosis, aneurysm, or branch vessel occlusion within the Circle of Willis. 6. 13 mm hypodense nodule in the posteroinferior right lobe of the thyroid. Not clinically significant; no follow-up imaging recommended (ref: J Am Coll Radiol. 2015 Feb;12(2): 143-50).     ASSESSMENT AND PLAN  86 y.o. 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 Last seizure was in 12/09/14.  Also with possible TIA on week of 05/02/16, right face drooping, right hand tingling, and slurred speech.  Last seizure 03/25/18.  Now left CN3 palsy (Nov 2021).   Dx: TIA vs seizure  1. Left oculomotor nerve palsy   2. Cerebrovascular accident (CVA) due to other mechanism ([place])   3. Other general symptoms and signs   4. Long-term use of high-risk medication      PLAN:  SEIZURE DISORDER (established problem, stable; last sz 03/25/18) - continue levetiracetam 1500mg  twice a day -  continue carbamazepine 100mg  twice a day   MICROVASCULAR INFARCTION (left CN3 palsy) --> TIA / stroke prevention - continue aspirin 81mg  daily - follow up with PCP re: BP, lipid and diabetes screening and mgmt  Meds ordered this encounter  Medications  . carbamazepine (CARBATROL) 100 MG 12 hr capsule    Sig: Take 1 capsule (100 mg total) by mouth 2 (two) times daily.    Dispense:  180 capsule    Refill:  4  . levETIRAcetam (KEPPRA) 750 MG tablet    Sig: Take 2 tablets (1,500 mg total) by mouth 2 (two) times daily.    Dispense:  360 tablet    Refill:  4    Orders Placed This Encounter  Procedures  . Lipid Panel  . Hemoglobin A1c   Return in about 1 year (around 01/26/2021).     Penni Bombard, MD 90/30/0923, 3:00 PM Certified in Neurology, Neurophysiology and Neuroimaging  [facility] [address] [place], [place] [address] [phone]

## 2020-01-28 LAB — LIPID PANEL
Chol/HDL Ratio: 2.6 ratio (ref 0.0–5.0)
Cholesterol, Total: 183 mg/dL (ref 100–199)
HDL: 70 mg/dL (ref >39)
LDL Chol Calc (NIH): 100 mg/dL — ABNORMAL HIGH (ref 0–99)
Triglycerides: 72 mg/dL (ref 0–149)
VLDL Cholesterol Cal: 13 mg/dL (ref 5–40)

## 2020-01-28 LAB — HEMOGLOBIN A1C
Est. average glucose Bld gHb Est-mCnc: 111 mg/dL
Hgb A1c MFr Bld: 5.5 % (ref 4.8–5.6)

## 2020-02-03 ENCOUNTER — Telehealth: Payer: Self-pay | Admitting: *Deleted

## 2020-02-03 NOTE — Telephone Encounter (Signed)
Called home #, invalid #. Called mobile, immediately stated "I'm sorry but person you are trying to call has a VMB that's not set up". Will try later.

## 2020-02-04 ENCOUNTER — Encounter: Payer: Self-pay | Admitting: *Deleted

## 2020-02-04 NOTE — Telephone Encounter (Signed)
Attempted to reach patient again on both # tried before, received same messages. Mailed lab result letter to patient.

## 2020-07-19 ENCOUNTER — Emergency Department (HOSPITAL_BASED_OUTPATIENT_CLINIC_OR_DEPARTMENT_OTHER): Payer: Medicare Other

## 2020-07-19 ENCOUNTER — Other Ambulatory Visit: Payer: Self-pay

## 2020-07-19 ENCOUNTER — Encounter (HOSPITAL_BASED_OUTPATIENT_CLINIC_OR_DEPARTMENT_OTHER): Payer: Self-pay

## 2020-07-19 ENCOUNTER — Emergency Department (HOSPITAL_BASED_OUTPATIENT_CLINIC_OR_DEPARTMENT_OTHER)
Admission: EM | Admit: 2020-07-19 | Discharge: 2020-07-19 | Disposition: A | Discharge status: Home / Self Care | Payer: Medicare Other | Attending: Emergency Medicine | Admitting: Emergency Medicine

## 2020-07-19 DIAGNOSIS — I5031 Acute diastolic (congestive) heart failure: Secondary | ICD-10-CM | POA: Insufficient documentation

## 2020-07-19 DIAGNOSIS — N289 Disorder of kidney and ureter, unspecified: Secondary | ICD-10-CM | POA: Diagnosis not present

## 2020-07-19 DIAGNOSIS — Z87891 Personal history of nicotine dependence: Secondary | ICD-10-CM | POA: Diagnosis not present

## 2020-07-19 DIAGNOSIS — Z7982 Long term (current) use of aspirin: Secondary | ICD-10-CM | POA: Diagnosis not present

## 2020-07-19 DIAGNOSIS — R5383 Other fatigue: Secondary | ICD-10-CM | POA: Diagnosis not present

## 2020-07-19 DIAGNOSIS — Z79899 Other long term (current) drug therapy: Secondary | ICD-10-CM | POA: Insufficient documentation

## 2020-07-19 DIAGNOSIS — I130 Hypertensive heart and chronic kidney disease with heart failure and stage 1 through stage 4 chronic kidney disease, or unspecified chronic kidney disease: Secondary | ICD-10-CM | POA: Insufficient documentation

## 2020-07-19 DIAGNOSIS — R531 Weakness: Secondary | ICD-10-CM | POA: Diagnosis not present

## 2020-07-19 DIAGNOSIS — Z96642 Presence of left artificial hip joint: Secondary | ICD-10-CM | POA: Insufficient documentation

## 2020-07-19 DIAGNOSIS — R0781 Pleurodynia: Secondary | ICD-10-CM

## 2020-07-19 DIAGNOSIS — I712 Thoracic aortic aneurysm, without rupture, unspecified: Secondary | ICD-10-CM

## 2020-07-19 DIAGNOSIS — J90 Pleural effusion, not elsewhere classified: Secondary | ICD-10-CM | POA: Diagnosis not present

## 2020-07-19 DIAGNOSIS — R109 Unspecified abdominal pain: Secondary | ICD-10-CM

## 2020-07-19 DIAGNOSIS — N183 Chronic kidney disease, stage 3 unspecified: Secondary | ICD-10-CM | POA: Insufficient documentation

## 2020-07-19 DIAGNOSIS — Z20822 Contact with and (suspected) exposure to covid-19: Secondary | ICD-10-CM | POA: Insufficient documentation

## 2020-07-19 LAB — TROPONIN I (HIGH SENSITIVITY)
Troponin I (High Sensitivity): 8 ng/L (ref <18)
Troponin I (High Sensitivity): 8 ng/L (ref <18)

## 2020-07-19 LAB — DIFFERENTIAL
Abs Immature Granulocytes: 0.05 10*3/uL (ref 0.00–0.07)
Basophils Absolute: 0 10*3/uL (ref 0.0–0.1)
Basophils Relative: 0 %
Eosinophils Absolute: 0.3 10*3/uL (ref 0.0–0.5)
Eosinophils Relative: 3 %
Immature Granulocytes: 1 %
Lymphocytes Relative: 7 %
Lymphs Abs: 0.6 10*3/uL — ABNORMAL LOW (ref 0.7–4.0)
Monocytes Absolute: 0.8 10*3/uL (ref 0.1–1.0)
Monocytes Relative: 10 %
Neutro Abs: 6.8 10*3/uL (ref 1.7–7.7)
Neutrophils Relative %: 79 %

## 2020-07-19 LAB — CBC
HCT: 37.4 % — ABNORMAL LOW (ref 39.0–52.0)
Hemoglobin: 12.2 g/dL — ABNORMAL LOW (ref 13.0–17.0)
MCH: 30.1 pg (ref 26.0–34.0)
MCHC: 32.6 g/dL (ref 30.0–36.0)
MCV: 92.3 fL (ref 80.0–100.0)
Platelets: 432 10*3/uL — ABNORMAL HIGH (ref 150–400)
RBC: 4.05 MIL/uL — ABNORMAL LOW (ref 4.22–5.81)
RDW: 12.5 % (ref 11.5–15.5)
WBC: 8.7 10*3/uL (ref 4.0–10.5)
nRBC: 0 % (ref 0.0–0.2)

## 2020-07-19 LAB — HEPATIC FUNCTION PANEL
ALT: 25 U/L (ref 0–44)
AST: 23 U/L (ref 15–41)
Albumin: 3.4 g/dL — ABNORMAL LOW (ref 3.5–5.0)
Alkaline Phosphatase: 80 U/L (ref 38–126)
Bilirubin, Direct: <0.1 mg/dL (ref 0.0–0.2)
Total Bilirubin: 0.4 mg/dL (ref 0.3–1.2)
Total Protein: 7 g/dL (ref 6.5–8.1)

## 2020-07-19 LAB — URINALYSIS, ROUTINE W REFLEX MICROSCOPIC
Bilirubin Urine: NEGATIVE
Glucose, UA: NEGATIVE mg/dL
Hgb urine dipstick: NEGATIVE
Ketones, ur: NEGATIVE mg/dL
Leukocytes,Ua: NEGATIVE
Nitrite: NEGATIVE
Protein, ur: 30 mg/dL — AB
Specific Gravity, Urine: 1.024 (ref 1.005–1.030)
pH: 5.5 (ref 5.0–8.0)

## 2020-07-19 LAB — BASIC METABOLIC PANEL
Anion gap: 9 (ref 5–15)
BUN: 39 mg/dL — ABNORMAL HIGH (ref 8–23)
CO2: 30 mmol/L (ref 22–32)
Calcium: 9.2 mg/dL (ref 8.9–10.3)
Chloride: 99 mmol/L (ref 98–111)
Creatinine, Ser: 1.53 mg/dL — ABNORMAL HIGH (ref 0.61–1.24)
GFR, Estimated: 43 mL/min — ABNORMAL LOW (ref >60)
Glucose, Bld: 121 mg/dL — ABNORMAL HIGH (ref 70–99)
Potassium: 4.2 mmol/L (ref 3.5–5.1)
Sodium: 138 mmol/L (ref 135–145)

## 2020-07-19 LAB — RESP PANEL BY RT-PCR (FLU A&B, COVID) ARPGX2
Influenza A by PCR: NEGATIVE
Influenza B by PCR: NEGATIVE
SARS Coronavirus 2 by RT PCR: NEGATIVE

## 2020-07-19 MED ORDER — IOHEXOL 350 MG/ML SOLN
75.0000 mL | Freq: Once | INTRAVENOUS | Status: AC | PRN
  Administered 2020-07-19: 75 mL via INTRAVENOUS

## 2020-07-19 NOTE — ED Notes (Signed)
Attempted to obtain urine sample from patient. Patient unable to void at that time, pt left with urinal in place and support person at bedside.

## 2020-07-19 NOTE — ED Provider Notes (Signed)
[facility] EMERGENCY DEPT Provider Note   CSN: [id number] Arrival date & time: 07/19/20  1442     History Chief Complaint  Patient presents with  . Fall         Charles Randall. is a 87 y.o. male.  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 Anemia    STARTED PILLS 1 MONTH AGO NEW DX  . Aortic insufficiency    mild, moderate  . Arthritis    FINGERS SWELL PT THINKS ARTHITIS, TOP OF WRIST ALSO SWELLS ONCE IN A WHILE  . Bleeds easily ([place])    BRUISE EASY ALSO  . Chronic kidney disease   . Cough    LAST 3 TO 4 WEEKS PHLEGM YELLOW INTERMITTENT COUGH NO FEVER  . Dyslipidemia   . H/O dizziness    OCC   . H/O Doppler ultrasound 2010  . H/O echocardiogram 2013   aortic valve disorder, EF =>55%  . H/O echocardiogram 2012   AVD, EF =>55%  . Heart murmur   . History of hiatal hernia   . HTN (hypertension)   . Inguinal hernia   . Mitral regurgitation    mild  . Pneumonia FEW YEARS AGO   X 1  . Post-polio syndrome age 65  . Seizures ([place])    last sz 2016 NO CAUSE FOUND, 3 seizures on 03/25/18   . Swelling of both lower extremities    DOES NOT ALWAYS DO DOWN   . Tricuspid regurgitation    mild    Patient Active Problem List   Diagnosis Date Noted  . Acute diastolic CHF (congestive heart failure) ([place])   . History of hemiarthroplasty of left hip  02/13/2018  . Closed fracture of neck of left femur ([place]) 02/12/2018  . Moderate aortic regurgitation 02/12/2018  . CKD (chronic kidney disease), stage III ([place]) 02/12/2018  . Displaced fracture of left femoral neck ([place]) 02/12/2018  . Left displaced femoral neck fracture ([place]) 02/12/2018  . Arterial insufficiency of lower extremity ([place]) 11/22/2017  . Bilateral inguinal hernia (BIH) s/p lap hernia repair w mesh 08/17/2017 08/17/2017  . Femoral hernia - right - s/p lap hernia repair w mesh 08/17/2017 08/17/2017  . Dysphagia 04/06/2016  . Partial symptomatic epilepsy with complex partial seizures, not intractable, without status epilepticus ([place]) 11/16/2015  . Normocytic anemia 10/13/2015  . Seizure disorder ([place]) 05/12/2015  . Edema of lower extremity 04/09/2015  . Benign essential hypertension 04/09/2015  . Pleural effusion, right 04/09/2015  . 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 Memory loss 04/07/2015  . Mixed hyperlipidemia 04/07/2015  . Pain of left thumb 04/07/2015  . Simple partial seizure disorder ([place]) 04/07/2015  . Skin lesion of right arm 04/07/2015  . Transient alteration of awareness 04/07/2015    Past Surgical History:  Procedure  Laterality Date  . COLONSCOPY  5 YRS AGO  . DENTAL SURGERY  3-4 WEEKS AGO  . FEMORAL HERNIA REPAIR Right 08/17/2017   Procedure: HERNIA REPAIR FEMORAL LAPAROSCOPIC;  Surgeon: Michael Boston, MD;  Location: [facility];  Service: General;  Laterality: Right;  . INGUINAL HERNIA REPAIR Bilateral 08/17/2017   Procedure: LAPAROSCOPIC BILATERAL INGUINAL HERNIA REPAIR;  Surgeon: Michael Boston, MD;  Location: [facility];  Service: General;  Laterality: Bilateral;  . INSERTION OF MESH Bilateral 08/17/2017   Procedure: INSERTION OF MESH;  Surgeon: Michael Boston, MD;  Location: [facility];  Service: General;  Laterality: Bilateral;  . TOTAL HIP ARTHROPLASTY Left 02/13/2018   Procedure: HEMI  ARTHROPLASTY;  Surgeon: Paralee Cancel, MD;  Location: [facility];  Service:  Orthopedics;  Laterality: Left;       Family History  Problem Relation Age of Onset  . Cancer Mother   . Heart disease Father   . Cancer Maternal Grandmother   . Cancer Maternal Grandfather   . Kidney failure Sister     Social History   Tobacco Use  . Smoking status: Former Smoker    Packs/day: 0.50    Years: 15.00    Pack years: 7.50    Quit date: 03/01/1963    Years since quitting: 57.4  . Smokeless tobacco: Never Used  Vaping Use  . Vaping Use: Never used  Substance Use Topics  . Alcohol use: No  . Drug use: No    Home Medications Prior to Admission medications   Medication Sig Start Date End Date Taking? Authorizing Provider  Ascorbic Acid (VITAMIN C) 1000 MG tablet Take 1,000 mg by mouth daily.   Yes [provider]  aspirin EC 81 MG tablet Take 1 tablet (81 mg total) by mouth every other day. 11/21/18  Yes Croitoru, Mihai, MD  carbamazepine (CARBATROL) 100 MG 12 hr capsule Take 1 capsule (100 mg total) by mouth 2 (two) times daily. 01/27/20  Yes Penumalli, Earlean Polka, MD  Cholecalciferol (VITAMIN D-3) 1000 UNITS CAPS Take 1,000 Units by mouth daily.    Yes [provider]  Dupilumab (DUPIXENT) 300 MG/2ML SOPN Inject into the skin. Every 2 weeks,injection   Yes [provider]  levETIRAcetam (KEPPRA) 750 MG tablet Take 2 tablets (1,500 mg total) by mouth 2 (two) times daily. 01/27/20  Yes Penumalli, Earlean Polka, MD  lisinopril-hydrochlorothiazide (PRINZIDE,ZESTORETIC) 20-25 MG tablet Take 1 tablet by mouth daily.  05/24/17  Yes [provider]  Magnesium 250 MG TABS Take 250 mg by mouth daily.   Yes [provider]  vitamin B-12 (CYANOCOBALAMIN) 1000 MCG tablet Take 1,000 mcg by mouth daily.   Yes [provider]  vitamin E 400 UNIT capsule Take 400 Units by mouth daily.    Yes [provider]  ezetimibe (ZETIA) 10 MG tablet TAKE 1 TABLET BY MOUTH ONCE DAILY. THIS IS TO REPLACE THE ATORVASTATIN 07/20/20   Croitoru,  Mihai, MD  imiquimod (ALDARA) 5 % cream APPLY ON THE SKIN AS DIRECTED EVERY OTHER NIGHT FOR 4 WEEKS 10/30/18   [provider]  triamcinolone ointment (KENALOG) 0.1 % APPLY OINTMENT TOPICALLY TWICE DAILY AS NEEDED FOR FLARES 07/03/18   [provider]    Allergies    Patient has no known allergies.  Review of Systems   Review of Systems  Constitutional: Positive for fatigue. Negative for fever.  HENT: Negative for sore throat.   Eyes: Negative for visual disturbance.  Respiratory: Negative for shortness of breath.   Cardiovascular:  Negative for chest pain.  Gastrointestinal: Negative for abdominal pain, nausea and vomiting.  Genitourinary: Positive for flank pain. Negative for difficulty urinating.  Musculoskeletal: Negative for back pain and neck stiffness.  Skin: Negative for rash.  Neurological: Negative for syncope and headaches.    Physical Exam Updated Vital Signs BP 132/65 (BP Location: Right Arm)   Pulse 62   Temp 97.8 F (36.6 C) (Oral)   Resp 14   Ht 5\' 6"  (1.676 m)   Wt 63.5 kg   SpO2 95%   BMI 22.60 kg/m   Physical Exam Vitals and nursing note reviewed.  Constitutional:      General: He is not in acute distress.    Appearance: He is well-developed. He is not diaphoretic.  HENT:     Head: Normocephalic and atraumatic.  Eyes:     Conjunctiva/sclera: Conjunctivae normal.  Cardiovascular:     Rate and Rhythm: Normal rate and regular rhythm.     Heart sounds: Normal heart sounds. No murmur heard. No friction rub. No gallop.   Pulmonary:     Effort: Pulmonary effort is normal. No respiratory distress.     Breath sounds: Normal breath sounds. No wheezing or rales.  Abdominal:     General: There is no distension.     Palpations: Abdomen is soft.     Tenderness: There is abdominal tenderness. There is no guarding.  Musculoskeletal:     Cervical back: Normal range of motion.  Skin:    General: Skin is warm and dry.  Neurological:     Mental  Status: He is alert and oriented to person, place, and time.     ED Results / Procedures / Treatments   Labs (all labs ordered are listed, but only abnormal results are displayed) Labs Reviewed  BASIC METABOLIC PANEL - Abnormal; Notable for the following components:      Result Value   Glucose, Bld 121 (*)    BUN 39 (*)    Creatinine, Ser 1.53 (*)    GFR, Estimated 43 (*)    All other components within normal limits  CBC - Abnormal; Notable for the following components:   RBC 4.05 (*)    Hemoglobin 12.2 (*)    HCT 37.4 (*)    Platelets 432 (*)    All other components within normal limits  URINALYSIS, ROUTINE W REFLEX MICROSCOPIC - Abnormal; Notable for the following components:   Protein, ur 30 (*)    All other components within normal limits  HEPATIC FUNCTION PANEL - Abnormal; Notable for the following components:   Albumin 3.4 (*)    All other components within normal limits  DIFFERENTIAL - Abnormal; Notable for the following components:   Lymphs Abs 0.6 (*)    All other components within normal limits  RESP PANEL BY RT-PCR (FLU A&B, COVID) [id number]  URINE CULTURE  TROPONIN I (HIGH SENSITIVITY)  TROPONIN I (HIGH SENSITIVITY)    EKG EKG Interpretation  Date/Time:  Sunday Jul 19 2020 15:04:37 EDT Ventricular Rate:  83 PR Interval:  190 QRS Duration: 76 QT Interval:  348 QTC Calculation: 408 R Axis:   -18 Text Interpretation: Normal sinus rhythm Anterolateral infarct , age undetermined Abnormal ECG No significant change since last tracing Confirmed by Gareth Morgan [id number]) on 07/19/2020 5:03:49 PM   Radiology CT ABDOMEN PELVIS WO CONTRAST  Result Date: 07/19/2020 CLINICAL DATA:  Multiple falls. EXAM: CT CHEST, ABDOMEN AND PELVIS WITHOUT CONTRAST TECHNIQUE: Multidetector CT imaging of the chest, abdomen and pelvis  was performed following the standard protocol without IV contrast. COMPARISON:  February 17, 2018. FINDINGS: CT CHEST FINDINGS Cardiovascular: Grossly  stable 4.7 cm ascending thoracic aortic aneurysm is noted. Atherosclerosis of thoracic aorta is noted. Normal cardiac size. No pericardial effusion. Mediastinum/Nodes: No enlarged mediastinal, hilar, or axillary lymph nodes. Thyroid gland, trachea, and esophagus demonstrate no significant findings. Lungs/Pleura: No pneumothorax is noted. Moderate right pleural effusion is noted with adjacent atelectasis of the right lower lobe. Mild biapical scarring is noted. Musculoskeletal: Stable old compression fractures of the thoracic spine are noted. No acute osseous or chest wall abnormality is noted. CT ABDOMEN PELVIS FINDINGS Hepatobiliary: No focal liver abnormality is seen. No gallstones, gallbladder wall thickening, or biliary dilatation. Pancreas: Unremarkable. No pancreatic ductal dilatation or surrounding inflammatory changes. Spleen: Normal in size without focal abnormality. Adrenals/Urinary Tract: Adrenal glands appear normal. 19 x 13 mm partially exophytic soft tissue abnormality is seen arising posteriorly from the upper pole of left kidney. No hydronephrosis or renal obstruction is noted. No renal or ureteral calculi are noted. Urinary bladder is unremarkable. Stomach/Bowel: Stomach is within normal limits. Appendix appears normal. No evidence of bowel wall thickening, distention, or inflammatory changes. Vascular/Lymphatic: Aortic atherosclerosis. No enlarged abdominal or pelvic lymph nodes. Reproductive: Prostate is unremarkable. Other: No abdominal wall hernia or abnormality. No abdominopelvic ascites. Musculoskeletal: Status post left hip arthroplasty. No acute osseous abnormality is noted. IMPRESSION: Grossly stable 4.7 cm ascending thoracic aortic aneurysm. Ascending thoracic aortic aneurysm. Recommend semi-annual imaging followup by CTA or MRA and referral to cardiothoracic surgery if not already obtained. This recommendation follows 2010 ACCF/AHA/AATS/ACR/ASA/SCA/SCAI/SIR/STS/SVM Guidelines for the  Diagnosis and Management of Patients With Thoracic Aortic Disease. Circulation. 2010; 121ML:[id number]. Aortic aneurysm NOS (ICD10-I71.9). Moderate right pleural effusion is noted with adjacent subsegmental atelectasis of the right lower lobe. 19 x 13 mm partially exophytic soft tissue abnormality is seen arising posteriorly from the upper pole of left kidney. Possible neoplasm cannot be excluded. Further evaluation with MRI is recommended. Aortic Atherosclerosis (ICD10-I70.0). Electronically Signed   By: Marijo Conception M.D.   On: 07/19/2020 18:00   CT Head Wo Contrast  Result Date: 07/19/2020 CLINICAL DATA:  Fall, head trauma EXAM: CT HEAD WITHOUT CONTRAST TECHNIQUE: Contiguous axial images were obtained from the base of the skull through the vertex without intravenous contrast. COMPARISON:  12/08/2019 FINDINGS: Brain: No evidence of acute infarction, hemorrhage, hydrocephalus, extra-axial collection or mass lesion/mass effect. Small lacunar infarct in the right basal ganglia appears chronic, although is new compared to the previous MRI of 12/08/2019. Moderate low-density changes within the periventricular and subcortical white matter compatible with chronic microvascular ischemic change. Mild diffuse cerebral volume loss. Vascular: Atherosclerotic calcifications involving the large vessels of the skull base. No unexpected hyperdense vessel. Skull: Normal. Negative for fracture or focal lesion. Sinuses/Orbits: Chronic mucosal thickening within the left sphenoid sinus with associated hyperostosis. No acute findings. Other: Negative for scalp hematoma. IMPRESSION: 1. No acute intracranial findings. 2. Chronic microvascular ischemic change and cerebral volume loss. 3. Chronic left sphenoid sinusitis. Electronically Signed   By: Davina Poke D.O.   On: 07/19/2020 17:51   CT Chest Wo Contrast  Result Date: 07/19/2020 CLINICAL DATA:  Multiple falls. EXAM: CT CHEST, ABDOMEN AND PELVIS WITHOUT CONTRAST  TECHNIQUE: Multidetector CT imaging of the chest, abdomen and pelvis was performed following the standard protocol without IV contrast. COMPARISON:  February 17, 2018. FINDINGS: CT CHEST FINDINGS Cardiovascular: Grossly stable 4.7 cm ascending thoracic aortic aneurysm is noted. Atherosclerosis of thoracic  aorta is noted. Normal cardiac size. No pericardial effusion. Mediastinum/Nodes: No enlarged mediastinal, hilar, or axillary lymph nodes. Thyroid gland, trachea, and esophagus demonstrate no significant findings. Lungs/Pleura: No pneumothorax is noted. Moderate right pleural effusion is noted with adjacent atelectasis of the right lower lobe. Mild biapical scarring is noted. Musculoskeletal: Stable old compression fractures of the thoracic spine are noted. No acute osseous or chest wall abnormality is noted. CT ABDOMEN PELVIS FINDINGS Hepatobiliary: No focal liver abnormality is seen. No gallstones, gallbladder wall thickening, or biliary dilatation. Pancreas: Unremarkable. No pancreatic ductal dilatation or surrounding inflammatory changes. Spleen: Normal in size without focal abnormality. Adrenals/Urinary Tract: Adrenal glands appear normal. 19 x 13 mm partially exophytic soft tissue abnormality is seen arising posteriorly from the upper pole of left kidney. No hydronephrosis or renal obstruction is noted. No renal or ureteral calculi are noted. Urinary bladder is unremarkable. Stomach/Bowel: Stomach is within normal limits. Appendix appears normal. No evidence of bowel wall thickening, distention, or inflammatory changes. Vascular/Lymphatic: Aortic atherosclerosis. No enlarged abdominal or pelvic lymph nodes. Reproductive: Prostate is unremarkable. Other: No abdominal wall hernia or abnormality. No abdominopelvic ascites. Musculoskeletal: Status post left hip arthroplasty. No acute osseous abnormality is noted. IMPRESSION: Grossly stable 4.7 cm ascending thoracic aortic aneurysm. Ascending thoracic aortic  aneurysm. Recommend semi-annual imaging followup by CTA or MRA and referral to cardiothoracic surgery if not already obtained. This recommendation follows 2010 ACCF/AHA/AATS/ACR/ASA/SCA/SCAI/SIR/STS/SVM Guidelines for the Diagnosis and Management of Patients With Thoracic Aortic Disease. Circulation. 2010; 121: [id number]. Aortic aneurysm NOS (ICD10-I71.9). Moderate right pleural effusion is noted with adjacent subsegmental atelectasis of the right lower lobe. 19 x 13 mm partially exophytic soft tissue abnormality is seen arising posteriorly from the upper pole of left kidney. Possible neoplasm cannot be excluded. Further evaluation with MRI is recommended. Aortic Atherosclerosis (ICD10-I70.0). Electronically Signed   By: Marijo Conception M.D.   On: 07/19/2020 18:00   CT Angio Chest/Abd/Pel for Dissection W and/or Wo Contrast  Result Date: 07/19/2020 CLINICAL DATA:  Chest and back pain.  Multiple falls. EXAM: CT ANGIOGRAPHY CHEST, ABDOMEN AND PELVIS TECHNIQUE: Non-contrast CT of the chest was initially obtained. Multidetector CT imaging through the chest, abdomen and pelvis was performed using the standard protocol during bolus administration of intravenous contrast. Multiplanar reconstructed images and MIPs were obtained and reviewed to evaluate the vascular anatomy. CONTRAST:  69mL OMNIPAQUE IOHEXOL 350 MG/ML SOLN COMPARISON:  Same day. FINDINGS: CTA CHEST FINDINGS Cardiovascular: Grossly stable 4.7 cm ascending thoracic aortic aneurysm is noted. Atherosclerosis of thoracic aorta is noted without dissection. Great vessels are widely patent without significant stenosis. Normal cardiac size. No pericardial effusion. Mediastinum/Nodes: No enlarged mediastinal, hilar, or axillary lymph nodes. Thyroid gland, trachea, and esophagus demonstrate no significant findings. Lungs/Pleura: Moderate right pleural effusion is noted with associated atelectasis of the right lower lobe. Musculoskeletal: No chest wall abnormality.  No acute or significant osseous findings. Review of the MIP images confirms the above findings. CTA ABDOMEN AND PELVIS FINDINGS VASCULAR Aorta: Atherosclerosis of thoracic aorta is noted without aneurysm or dissection. Celiac: Moderate to severe stenosis is seen involving the proximal portion of the celiac artery without thrombosis. SMA: Moderate severe stenosis is seen involving proximal portion of superior mesenteric artery without thrombosis. Renals: Bilateral renal arteries are patent without evidence of aneurysm, dissection, vasculitis, fibromuscular dysplasia or significant stenosis. IMA: Patent without evidence of aneurysm, dissection, vasculitis or significant stenosis. Inflow: Patent without evidence of aneurysm, dissection, vasculitis or significant stenosis. Veins: No obvious venous abnormality within the  limitations of this arterial phase study. Review of the MIP images confirms the above findings. NON-VASCULAR Hepatobiliary: No focal liver abnormality is seen. No gallstones, gallbladder wall thickening, or biliary dilatation. Pancreas: Unremarkable. No pancreatic ductal dilatation or surrounding inflammatory changes. Spleen: Normal in size without focal abnormality. Adrenals/Urinary Tract: Adrenal glands appear normal. No hydronephrosis or renal obstruction is noted. 2.0 x 1.8 cm exophytic soft tissue abnormality is seen arising posteriorly from the upper pole of left kidney. Urinary bladder is unremarkable. Stomach/Bowel: Stomach is within normal limits. Appendix appears normal. No evidence of bowel wall thickening, distention, or inflammatory changes. Lymphatic: No adenopathy is noted. Reproductive: Prostate is unremarkable. Other: No abdominal wall hernia or abnormality. No abdominopelvic ascites. Musculoskeletal: No fracture is seen. Review of the MIP images confirms the above findings. IMPRESSION: No evidence of thoracic or abdominal aortic dissection. 4.7 cm ascending thoracic aortic aneurysm.  Ascending thoracic aortic aneurysm. Recommend semi-annual imaging followup by CTA or MRA and referral to cardiothoracic surgery if not already obtained. This recommendation follows 2010 ACCF/AHA/AATS/ACR/ASA/SCA/SCAI/SIR/STS/SVM Guidelines for the Diagnosis and Management of Patients With Thoracic Aortic Disease. Circulation. 2010; 121: Q469-G295. Aortic aneurysm NOS (ICD10-I71.9). Moderate right pleural effusion is noted with adjacent atelectasis of the right lower lobe. Moderate to severe stenoses are seen involving the proximal portions of the celiac and superior mesenteric arteries. 2.0 x 1.8 cm exophytic soft tissue abnormality is seen arising posteriorly from upper pole of left kidney concerning for possible neoplasm. Further evaluation with MRI with and without gadolinium administration is recommended. Aortic Atherosclerosis (ICD10-I70.0). Electronically Signed   By: Marijo Conception M.D.   On: 07/19/2020 20:36    Procedures Procedures   Medications Ordered in ED Medications  iohexol (OMNIPAQUE) 350 MG/ML injection 75 mL (75 mLs Intravenous Contrast Given 07/19/20 1949)    ED Course  I have reviewed the triage vital signs and the nursing notes.  Pertinent labs & imaging results that were available during my care of the patient were reviewed by me and considered in my medical decision making (see chart for details).    MDM Rules/Calculators/A&P                           87 year old male with a history of anemia, aortic insufficiency, dyslipidemia, CKD, hypertension, postpolio syndrome, CHF, seizure disorder, presents with concern for generalized weakness, fall and right flank pain.  Differential diagnoses include anemia, electrolyte abnormality, ACS, infection, cholecystitis, pancreatitis, pyelonephritis, nephrolithiasis, aortic dissection.  CT head without acute bleed.  Urinalysis without signs of urinary tract infection.  COVID-19, flu testing negative.  Troponin negative x2 and have  low suspicion for ACS.  No sign of transaminitis.  No significant anemia to attribute symptoms.  Given severe fatigue, generalized weakness, flank pain, CT chest abdomen pelvis were ordered which were significant for right-sided pleural effusion, and left renal mass.  He is noted to have an aortic aneurysm.  Given presence of flank pain, CTA dissection study was ordered which showed no evidence of dissection.  Discussed incidental findings.  Discussed that there is an unclear etiology of his fatigue and generalized weakness, however I do not see signs of emergent pathology by history, exam, lab or imaging.  Flank pain likely caused by pleural effusion. Recommend follow up with PCP regarding renal mass, effusion and fatigue.    Final Clinical Impression(s) / ED Diagnoses Final diagnoses:  Pleural effusion  Pleuritic pain  Right flank pain  Fatigue, unspecified type  Thoracic  aortic aneurysm without rupture [facility]  Kidney lesion    Rx / DC Orders ED Discharge Orders    None       Gareth Morgan, MD 07/21/20 [phone]

## 2020-07-19 NOTE — ED Triage Notes (Signed)
Patient reports to the ER for multiple falls. Patient BIB EMS for right sided pain from axilla to flank. Patient reports he went ot pick something up off the floor and felt a significant pull in his side.

## 2020-07-19 NOTE — ED Triage Notes (Signed)
Pt states he fell 1 week ago. Pt reports pain to R flank. Pt also concerned because of generalized weakness.

## 2020-07-20 ENCOUNTER — Other Ambulatory Visit: Payer: Self-pay | Admitting: Cardiovascular Disease

## 2020-07-21 LAB — URINE CULTURE: Culture: NO GROWTH

## 2020-08-21 ENCOUNTER — Encounter (HOSPITAL_COMMUNITY): Payer: Self-pay | Admitting: Emergency Medicine

## 2020-08-21 ENCOUNTER — Inpatient Hospital Stay (HOSPITAL_COMMUNITY): Payer: Medicare Other

## 2020-08-21 ENCOUNTER — Emergency Department (HOSPITAL_COMMUNITY): Payer: Medicare Other

## 2020-08-21 ENCOUNTER — Inpatient Hospital Stay (HOSPITAL_COMMUNITY)
Admission: EM | Admit: 2020-08-21 | Discharge: 2020-08-26 | DRG: 064 | Disposition: A | Discharge status: Skilled Nursing Facility | Payer: Medicare Other | Attending: Internal Medicine | Admitting: Internal Medicine

## 2020-08-21 ENCOUNTER — Other Ambulatory Visit: Payer: Self-pay

## 2020-08-21 DIAGNOSIS — N1831 Chronic kidney disease, stage 3a: Secondary | ICD-10-CM | POA: Diagnosis present

## 2020-08-21 DIAGNOSIS — G8194 Hemiplegia, unspecified affecting left nondominant side: Secondary | ICD-10-CM | POA: Diagnosis present

## 2020-08-21 DIAGNOSIS — G40909 Epilepsy, unspecified, not intractable, without status epilepticus: Secondary | ICD-10-CM | POA: Diagnosis not present

## 2020-08-21 DIAGNOSIS — Z87891 Personal history of nicotine dependence: Secondary | ICD-10-CM

## 2020-08-21 DIAGNOSIS — I6389 Other cerebral infarction: Secondary | ICD-10-CM | POA: Diagnosis present

## 2020-08-21 DIAGNOSIS — R296 Repeated falls: Secondary | ICD-10-CM | POA: Diagnosis present

## 2020-08-21 DIAGNOSIS — N2889 Other specified disorders of kidney and ureter: Secondary | ICD-10-CM | POA: Diagnosis present

## 2020-08-21 DIAGNOSIS — Z66 Do not resuscitate: Secondary | ICD-10-CM | POA: Diagnosis present

## 2020-08-21 DIAGNOSIS — E782 Mixed hyperlipidemia: Secondary | ICD-10-CM | POA: Diagnosis present

## 2020-08-21 DIAGNOSIS — Z20822 Contact with and (suspected) exposure to covid-19: Secondary | ICD-10-CM | POA: Diagnosis present

## 2020-08-21 DIAGNOSIS — M21372 Foot drop, left foot: Secondary | ICD-10-CM | POA: Diagnosis present

## 2020-08-21 DIAGNOSIS — I618 Other nontraumatic intracerebral hemorrhage: Secondary | ICD-10-CM | POA: Diagnosis present

## 2020-08-21 DIAGNOSIS — R531 Weakness: Secondary | ICD-10-CM | POA: Diagnosis not present

## 2020-08-21 DIAGNOSIS — I129 Hypertensive chronic kidney disease with stage 1 through stage 4 chronic kidney disease, or unspecified chronic kidney disease: Secondary | ICD-10-CM | POA: Diagnosis present

## 2020-08-21 DIAGNOSIS — E785 Hyperlipidemia, unspecified: Secondary | ICD-10-CM | POA: Diagnosis present

## 2020-08-21 DIAGNOSIS — I712 Thoracic aortic aneurysm, without rupture: Secondary | ICD-10-CM | POA: Diagnosis present

## 2020-08-21 DIAGNOSIS — M21371 Foot drop, right foot: Secondary | ICD-10-CM | POA: Diagnosis present

## 2020-08-21 DIAGNOSIS — R066 Hiccough: Secondary | ICD-10-CM | POA: Diagnosis present

## 2020-08-21 DIAGNOSIS — R29704 NIHSS score 4: Secondary | ICD-10-CM | POA: Diagnosis present

## 2020-08-21 DIAGNOSIS — N183 Chronic kidney disease, stage 3 unspecified: Secondary | ICD-10-CM | POA: Diagnosis present

## 2020-08-21 DIAGNOSIS — Z79899 Other long term (current) drug therapy: Secondary | ICD-10-CM

## 2020-08-21 DIAGNOSIS — R29810 Facial weakness: Secondary | ICD-10-CM | POA: Diagnosis present

## 2020-08-21 DIAGNOSIS — I619 Nontraumatic intracerebral hemorrhage, unspecified: Secondary | ICD-10-CM | POA: Diagnosis present

## 2020-08-21 DIAGNOSIS — R5381 Other malaise: Secondary | ICD-10-CM | POA: Diagnosis present

## 2020-08-21 DIAGNOSIS — G14 Postpolio syndrome: Secondary | ICD-10-CM | POA: Diagnosis present

## 2020-08-21 DIAGNOSIS — I10 Essential (primary) hypertension: Secondary | ICD-10-CM | POA: Diagnosis not present

## 2020-08-21 DIAGNOSIS — Z8249 Family history of ischemic heart disease and other diseases of the circulatory system: Secondary | ICD-10-CM | POA: Diagnosis not present

## 2020-08-21 DIAGNOSIS — Z7982 Long term (current) use of aspirin: Secondary | ICD-10-CM | POA: Diagnosis not present

## 2020-08-21 DIAGNOSIS — Z96642 Presence of left artificial hip joint: Secondary | ICD-10-CM | POA: Diagnosis present

## 2020-08-21 DIAGNOSIS — Z809 Family history of malignant neoplasm, unspecified: Secondary | ICD-10-CM | POA: Diagnosis not present

## 2020-08-21 LAB — BASIC METABOLIC PANEL
Anion gap: 8 (ref 5–15)
BUN: 36 mg/dL — ABNORMAL HIGH (ref 8–23)
CO2: 28 mmol/L (ref 22–32)
Calcium: 9.1 mg/dL (ref 8.9–10.3)
Chloride: 97 mmol/L — ABNORMAL LOW (ref 98–111)
Creatinine, Ser: 1.41 mg/dL — ABNORMAL HIGH (ref 0.61–1.24)
GFR, Estimated: 47 mL/min — ABNORMAL LOW (ref >60)
Glucose, Bld: 158 mg/dL — ABNORMAL HIGH (ref 70–99)
Potassium: 3.9 mmol/L (ref 3.5–5.1)
Sodium: 133 mmol/L — ABNORMAL LOW (ref 135–145)

## 2020-08-21 LAB — URINALYSIS, ROUTINE W REFLEX MICROSCOPIC
Bacteria, UA: NONE SEEN
Bilirubin Urine: NEGATIVE
Glucose, UA: NEGATIVE mg/dL
Hgb urine dipstick: NEGATIVE
Ketones, ur: NEGATIVE mg/dL
Leukocytes,Ua: NEGATIVE
Nitrite: NEGATIVE
Protein, ur: 30 mg/dL — AB
Specific Gravity, Urine: 1.018 (ref 1.005–1.030)
pH: 5 (ref 5.0–8.0)

## 2020-08-21 LAB — CBC
HCT: 33.2 % — ABNORMAL LOW (ref 39.0–52.0)
Hemoglobin: 11 g/dL — ABNORMAL LOW (ref 13.0–17.0)
MCH: 30.6 pg (ref 26.0–34.0)
MCHC: 33.1 g/dL (ref 30.0–36.0)
MCV: 92.2 fL (ref 80.0–100.0)
Platelets: 350 10*3/uL (ref 150–400)
RBC: 3.6 MIL/uL — ABNORMAL LOW (ref 4.22–5.81)
RDW: 13.5 % (ref 11.5–15.5)
WBC: 9.2 10*3/uL (ref 4.0–10.5)
nRBC: 0 % (ref 0.0–0.2)

## 2020-08-21 LAB — HEPATIC FUNCTION PANEL
ALT: 18 U/L (ref 0–44)
AST: 22 U/L (ref 15–41)
Albumin: 2.9 g/dL — ABNORMAL LOW (ref 3.5–5.0)
Alkaline Phosphatase: 75 U/L (ref 38–126)
Bilirubin, Direct: 0.1 mg/dL (ref 0.0–0.2)
Indirect Bilirubin: 0.3 mg/dL (ref 0.3–0.9)
Total Bilirubin: 0.4 mg/dL (ref 0.3–1.2)
Total Protein: 6.5 g/dL (ref 6.5–8.1)

## 2020-08-21 LAB — CBG MONITORING, ED: Glucose-Capillary: 163 mg/dL — ABNORMAL HIGH (ref 70–99)

## 2020-08-21 MED ORDER — LEVETIRACETAM 500 MG PO TABS
1500.0000 mg | ORAL_TABLET | Freq: Two times a day (BID) | ORAL | Status: DC
  Administered 2020-08-21 – 2020-08-26 (×10): 1500 mg via ORAL
  Filled 2020-08-21 (×11): qty 3

## 2020-08-21 MED ORDER — TACROLIMUS 0.1 % EX OINT: TOPICAL_OINTMENT | Freq: Two times a day (BID) | CUTANEOUS | Status: DC | PRN

## 2020-08-21 MED ORDER — ACETAMINOPHEN 160 MG/5ML PO SOLN: 650.0000 mg | ORAL | Status: DC | PRN

## 2020-08-21 MED ORDER — ACETAMINOPHEN 325 MG PO TABS
650.0000 mg | ORAL_TABLET | ORAL | Status: DC | PRN
Start: 2020-08-21 — End: 2020-08-26

## 2020-08-21 MED ORDER — STROKE: EARLY STAGES OF RECOVERY BOOK
Freq: Once | Status: DC
  Filled 2020-08-21: qty 1

## 2020-08-21 MED ORDER — ACETAMINOPHEN 650 MG RE SUPP: 650.0000 mg | RECTAL | Status: DC | PRN

## 2020-08-21 MED ORDER — LISINOPRIL-HYDROCHLOROTHIAZIDE 20-25 MG PO TABS: 1.0000 | ORAL_TABLET | Freq: Every day | ORAL | Status: DC

## 2020-08-21 MED ORDER — HYDROCHLOROTHIAZIDE 12.5 MG PO TABS
6.2500 mg | ORAL_TABLET | Freq: Every day | ORAL | Status: DC
  Filled 2020-08-21: qty 1

## 2020-08-21 MED ORDER — EZETIMIBE 10 MG PO TABS
10.0000 mg | ORAL_TABLET | Freq: Every day | ORAL | Status: DC
  Administered 2020-08-22 – 2020-08-26 (×5): 10 mg via ORAL
  Filled 2020-08-21 (×5): qty 1

## 2020-08-21 MED ORDER — SENNOSIDES-DOCUSATE SODIUM 8.6-50 MG PO TABS
1.0000 | ORAL_TABLET | Freq: Two times a day (BID) | ORAL | Status: DC
  Administered 2020-08-21 – 2020-08-25 (×9): 1 via ORAL
  Filled 2020-08-21 (×11): qty 1

## 2020-08-21 MED ORDER — GADOBUTROL 1 MMOL/ML IV SOLN
6.5000 mL | Freq: Once | INTRAVENOUS | Status: AC | PRN
  Administered 2020-08-21: 6.5 mL via INTRAVENOUS

## 2020-08-21 MED ORDER — CARBAMAZEPINE ER 100 MG PO TB12
100.0000 mg | ORAL_TABLET | Freq: Two times a day (BID) | ORAL | Status: DC
  Administered 2020-08-21 – 2020-08-26 (×9): 100 mg via ORAL
  Filled 2020-08-21 (×11): qty 1

## 2020-08-21 MED ORDER — PANTOPRAZOLE SODIUM 40 MG IV SOLR
40.0000 mg | Freq: Every day | INTRAVENOUS | Status: DC
  Administered 2020-08-21 – 2020-08-22 (×2): 40 mg via INTRAVENOUS
  Filled 2020-08-21 (×2): qty 40

## 2020-08-21 NOTE — H&P (Signed)
History and Physical    Charles Randall. ZOX:[id number] DOB: 02-Mar-1927 DOA: 08/21/2020  PCP: Nickola Major, MD  Patient coming from: Home  I have personally briefly reviewed patient's old medical records in [place]  Chief Complaint: Falls, weakness  HPI: Charles Randall. is a 87 y.o. male with medical history significant of CKD 3, seizure disorder controlled on meds, HLD.  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  Pt presents to ED with frequent falls onset over the past 1.5 weeks.  Pt states falls due to balance problems and generalized weakness.  No syncope.  Hit head 1 week ago but not since then.  No LOC.  Symptoms persistent, nothing makes better or worse.  No pain from falls at this point, just skin tears.  No fever, CP, SOB, dysuria, abd pain.   ED Course: CT head reveals new subacute R basal ganglion hemorrhagic stroke.  This not present on CT head last month.   Review of Systems: As per HPI, otherwise all review of systems negative.  Past Medical History:  Diagnosis Date   Anemia    STARTED PILLS 1 MONTH AGO NEW DX   Aortic insufficiency    mild, moderate   Arthritis    FINGERS SWELL PT THINKS ARTHITIS, TOP OF WRIST ALSO SWELLS ONCE IN A WHILE   Bleeds easily (HCC)    BRUISE EASY ALSO   Chronic kidney disease    Cough    LAST 3 TO 4 WEEKS PHLEGM YELLOW INTERMITTENT COUGH NO FEVER   Dyslipidemia    H/O dizziness    OCC    H/O Doppler ultrasound 2010   H/O echocardiogram 2013   aortic valve disorder, EF =>55%   H/O echocardiogram 2012   AVD, EF =>55%   Heart murmur    History of hiatal hernia    HTN (hypertension)    Inguinal hernia    Mitral regurgitation    mild   Pneumonia FEW YEARS AGO   X 1   Post-polio syndrome age 83   Seizures ([place])    last sz 2016 NO CAUSE FOUND, 3 seizures on 03/25/18    Swelling of both lower extremities    DOES NOT ALWAYS DO DOWN    Tricuspid regurgitation    mild    Past Surgical  History:  Procedure Laterality Date   COLONSCOPY  5 YRS AGO   DENTAL SURGERY  3-4 WEEKS AGO   FEMORAL HERNIA REPAIR Right 08/17/2017   Procedure: HERNIA REPAIR FEMORAL LAPAROSCOPIC;  Surgeon: Michael Boston, MD;  Location: [facility];  Service: General;  Laterality: Right;   INGUINAL HERNIA REPAIR Bilateral 08/17/2017   Procedure: LAPAROSCOPIC BILATERAL INGUINAL HERNIA REPAIR;  Surgeon: Michael Boston, MD;  Location: [facility];  Service: General;  Laterality: Bilateral;   INSERTION OF MESH Bilateral 08/17/2017   Procedure: INSERTION OF MESH;  Surgeon: Michael Boston, MD;  Location: [facility];  Service: General;  Laterality: Bilateral;   TOTAL HIP ARTHROPLASTY Left 02/13/2018   Procedure: HEMI  ARTHROPLASTY;  Surgeon: Paralee Cancel, MD;  Location: [facility];  Service: Orthopedics;  Laterality: Left;     reports that he quit smoking about 57 years ago. He has a 7.50 pack-year smoking history. He has never used smokeless tobacco. He reports that he does not drink alcohol and does not use drugs.  No Known Allergies  Family History  Problem Relation Age of Onset   Cancer Mother    Heart disease Father  Cancer Maternal Grandmother    Cancer Maternal Grandfather    Kidney failure Sister      Prior to Admission medications   Medication Sig Start Date End Date Taking? Authorizing Provider  Ascorbic Acid (VITAMIN C) 1000 MG tablet Take 1,000 mg by mouth daily.    [provider]  aspirin EC 81 MG tablet Take 1 tablet (81 mg total) by mouth every other day. 11/21/18   Croitoru, Mihai, MD  carbamazepine (CARBATROL) 100 MG 12 hr capsule Take 1 capsule (100 mg total) by mouth 2 (two) times daily. 01/27/20   Penumalli, Earlean Polka, MD  Cholecalciferol (VITAMIN D-3) 1000 UNITS CAPS Take 1,000 Units by mouth daily.     [provider]  Dupilumab (DUPIXENT) 300 MG/2ML SOPN Inject into the skin. Every 2 weeks,injection    [provider]  ezetimibe (ZETIA) 10 MG tablet TAKE 1 TABLET BY MOUTH  ONCE DAILY. THIS IS TO REPLACE THE ATORVASTATIN 07/20/20   Croitoru, Mihai, MD  hydrochlorothiazide (HYDRODIURIL) 12.5 MG tablet Take 6.25 mg by mouth daily. 08/05/20   [provider]  imiquimod (ALDARA) 5 % cream APPLY ON THE SKIN AS DIRECTED EVERY OTHER NIGHT FOR 4 WEEKS 10/30/18   [provider]  levETIRAcetam (KEPPRA) 750 MG tablet Take 2 tablets (1,500 mg total) by mouth 2 (two) times daily. 01/27/20   Penumalli, Earlean Polka, MD  lisinopril-hydrochlorothiazide (PRINZIDE,ZESTORETIC) 20-25 MG tablet Take 1 tablet by mouth daily.  05/24/17   [provider]  Magnesium 250 MG TABS Take 250 mg by mouth daily.    [provider]  oxyCODONE (OXY IR/ROXICODONE) 5 MG immediate release tablet Take 5 mg by mouth 4 (four) times daily as needed. 08/19/20   [provider]  tacrolimus (PROTOPIC) 0.1 % ointment Apply topically 2 (two) times daily as needed. 05/28/20   [provider]  triamcinolone ointment (KENALOG) 0.1 % APPLY OINTMENT TOPICALLY TWICE DAILY AS NEEDED FOR FLARES 07/03/18   [provider]  vitamin B-12 (CYANOCOBALAMIN) 1000 MCG tablet Take 1,000 mcg by mouth daily.    [provider]  vitamin E 400 UNIT capsule Take 400 Units by mouth daily.     [provider]    Physical Exam: Vitals:   08/21/20 1810 08/21/20 1825 08/21/20 1900 08/21/20 2000  BP:   128/72 124/68  Pulse: 83 83 87 85  Resp: 16 20 18 14   Temp:      TempSrc:      SpO2: 92% 95% 96% 94%    Constitutional: NAD, calm, comfortable Eyes: PERRL, lids and conjunctivae normal ENMT: Mucous membranes are moist. Posterior pharynx clear of any exudate or lesions.Normal dentition.  Healing abrasions L forehead Neck: normal, supple, no masses, no thyromegaly Respiratory: clear to auscultation bilaterally, no wheezing, no crackles. Normal respiratory effort. No accessory muscle use.  Cardiovascular: Regular rate and rhythm, no murmurs / rubs / gallops. No  extremity edema. 2+ pedal pulses. No carotid bruits.  Abdomen: no tenderness, no masses palpated. No hepatosplenomegaly. Bowel sounds positive.  Musculoskeletal: no clubbing / cyanosis. No joint deformity upper and lower extremities. Good ROM, no contractures. Normal muscle tone.  Skin: Bandages on arms due to skin tears Neurologic: Speech fluent, L sided weakness Psychiatric: Normal judgment and insight. Alert and oriented x 3. Normal mood.    Labs on Admission: I have personally reviewed following labs and imaging studies  CBC: Recent Labs  Lab 08/21/20 1630  WBC 9.2  HGB 11.0*  HCT 33.2*  MCV  92.2  PLT 956   Basic Metabolic Panel: Recent Labs  Lab 08/21/20 1630  NA 133*  K 3.9  CL 97*  CO2 28  GLUCOSE 158*  BUN 36*  CREATININE 1.41*  CALCIUM 9.1   GFR: CrCl cannot be calculated (Unknown ideal weight.). Liver Function Tests: Recent Labs  Lab 08/21/20 1645  AST 22  ALT 18  ALKPHOS 75  BILITOT 0.4  PROT 6.5  ALBUMIN 2.9*   No results for input(s): LIPASE, AMYLASE in the last 168 hours. No results for input(s): AMMONIA in the last 168 hours. Coagulation Profile: No results for input(s): INR, PROTIME in the last 168 hours. Cardiac Enzymes: No results for input(s): CKTOTAL, CKMB, CKMBINDEX, TROPONINI in the last 168 hours. BNP (last 3 results) No results for input(s): PROBNP in the last 8760 hours. HbA1C: No results for input(s): HGBA1C in the last 72 hours. CBG: Recent Labs  Lab 08/21/20 1637  GLUCAP 163*   Lipid Profile: No results for input(s): CHOL, HDL, LDLCALC, TRIG, CHOLHDL, LDLDIRECT in the last 72 hours. Thyroid Function Tests: No results for input(s): TSH, T4TOTAL, FREET4, T3FREE, THYROIDAB in the last 72 hours. Anemia Panel: No results for input(s): VITAMINB12, FOLATE, FERRITIN, TIBC, IRON, RETICCTPCT in the last 72 hours. Urine analysis:    Component Value Date/Time   COLORURINE YELLOW 08/21/2020 1630   APPEARANCEUR HAZY (A) 08/21/2020  1630   LABSPEC 1.018 08/21/2020 1630   PHURINE 5.0 08/21/2020 1630   GLUCOSEU NEGATIVE 08/21/2020 1630   HGBUR NEGATIVE 08/21/2020 1630   BILIRUBINUR NEGATIVE 08/21/2020 1630   KETONESUR NEGATIVE 08/21/2020 1630   PROTEINUR 30 (A) 08/21/2020 1630   UROBILINOGEN 0.2 10/13/2010 1103   NITRITE NEGATIVE 08/21/2020 1630   LEUKOCYTESUR NEGATIVE 08/21/2020 1630    Radiological Exams on Admission: DG Chest 2 View  Result Date: 08/21/2020 CLINICAL DATA:  Chest pain. Multiple falls this week. EXAM: CHEST - 2 VIEW COMPARISON:  CTA chest, abdomen, and pelvis dated Jul 19, 2020. FINDINGS: The heart size and mediastinal contours are within normal limits. Normal pulmonary vascularity. Small right greather than left pleural effusions with associated right greater than left lower lobe atelectasis. No pneumothorax. No acute osseous abnormality. IMPRESSION: Right greater than left pleural effusions and bibasilar atelectasis. Electronically Signed   By: Titus Dubin M.D.   On: 08/21/2020 19:53   CT Head Wo Contrast  Result Date: 08/21/2020 CLINICAL DATA:  Multiple fall EXAM: CT HEAD WITHOUT CONTRAST TECHNIQUE: Contiguous axial images were obtained from the base of the skull through the vertex without intravenous contrast. COMPARISON:  CT brain 07/19/2020, MRI 12/08/2019 FINDINGS: Brain: Negative for intracranial mass. Moderate atrophy and chronic small vessel ischemic change of the white matter. Hypodensity in the right basal ganglia with faint internal hyperdensity, contiguous with mild hyperdensity adherent to the lateral wall of the right anterior ventricle, series 2, image 22. Stable ventricle size. Vascular: No hyperdense vessel.  Carotid vascular calcification Skull: Normal. Negative for fracture or focal lesion. Sinuses/Orbits: No acute finding. Other: None IMPRESSION: 1. Increased hypodensity within the right basal ganglia which appears to be associated with faint hyperdensity, suggestive of subacute  hemorrhage or infarct. There appears to be a small amount of thrombus adherent to the lateral wall of the right anterior ventricle. No mass effect. No midline shift. 2. Atrophy and chronic small vessel ischemic changes of the white matter Critical Value/emergent results were called by telephone at the time of interpretation on 08/21/2020 at 6:26 pm to provider RACHEL LITTLE , who verbally acknowledged  these results. Electronically Signed   By: Donavan Foil M.D.   On: 08/21/2020 18:26    EKG: Independently reviewed.  Assessment/Plan Principal Problem:   Hemorrhagic stroke [facility]) Active Problems:   Benign essential hypertension   Seizure disorder (HCC)   CKD (chronic kidney disease), stage III (HCC)    Hemorrhagic stroke - ICH pathway Admit to [facility] Neuro consult MRI brain Stop ASA Monitor BP 2d echo PT/OT/SLP H/o Seizure disorder - Cont keppra and carbamazepine HTN - Cont home BP meds BP currently [phone] systolic in ED CKD 3 - Chronic, stable, baseline  DVT prophylaxis: SCDs Code Status: DNR Family Communication: Daughter at bedside Disposition Plan: TBD Consults called: Neuro Admission status: Admit to inpatient  Severity of Illness: The appropriate patient status for this patient is INPATIENT. Inpatient status is judged to be reasonable and necessary in order to provide the required intensity of service to ensure the patient's safety. The patient's presenting symptoms, physical exam findings, and initial radiographic and laboratory data in the context of their chronic comorbidities is felt to place them at high risk for further clinical deterioration. Furthermore, it is not anticipated that the patient will be medically stable for discharge from the hospital within 2 midnights of admission. The following factors support the patient status of inpatient.   IP status due to new subacute hemorrhagic stroke causing falls for past 1.5 weeks.   * I certify that at the point of  admission it is my clinical judgment that the patient will require inpatient hospital care spanning beyond 2 midnights from the point of admission due to high intensity of service, high risk for further deterioration and high frequency of surveillance required.*   Nuriyah Hanline M. DO [facility]  How to contact the [facility] Attending or Consulting provider [place] or covering provider during after hours [place], for this patient?  Check the care team in [facility] and look for a) attending/consulting [facility] provider listed and b) the [facility] team listed Log into [email or web address]  Amion Physician Scheduling and messaging for groups and whole hospitals  On call and physician scheduling software for group practices, residents, hospitalists and other medical providers for call, clinic, rotation and shift schedules. On[employer] is a hospital-wide system for scheduling doctors and paging doctors on call. EasyPlot is for scientific plotting and data analysis.  [email or web address]  and use [place]'s universal password to access. If you do not have the password, please contact the hospital operator.  Locate the [facility] provider you are looking for under [facility] and page to a number that you can be directly reached. If you still have difficulty reaching the provider, please page the [facility] (Director on Call) for the Hospitalists listed on amion for assistance.  08/21/2020, 9:38 PM

## 2020-08-21 NOTE — ED Notes (Signed)
Pt transported to MRI 

## 2020-08-21 NOTE — ED Notes (Addendum)
Pt placed on bedpan. [place] within reach

## 2020-08-21 NOTE — ED Triage Notes (Signed)
BIBA Per EMS:  Pt coming from home with increased falls over past week due to weakness. Skin tear to L forearm. Denies hitting head; denies blood thinners. A&Ox4 128/68 86 HR  96% RA 160 CBG

## 2020-08-21 NOTE — ED Provider Notes (Signed)
[facility] DEPT Provider Note   CSN: [id number] Arrival date & time: 08/21/20  1615     History Chief Complaint  Patient presents with  . Fall  . Weakness    Charles Vantol. is a 87 y.o. male.  87 year old male with extensive past medical history below including CKD, hypertension, hyperlipidemia who presents with falls and weakness.  Patient was brought in by EMS from home where he has had increased falls over the past 1 week.  He states the falls are due to balance problems and generalized weakness.  He denies any syncope.  He hit his head a week ago but has not hit his head since then.  No loss of consciousness.  He has several skin tears on his forearms from falls over the past few days.  He denies anticoagulant use.  He denies any recent illness including no vomiting, fevers, chest pain, shortness of breath, urinary symptoms, or abdominal pain.  He reports that PCP recently reduced his blood pressure medication because his blood pressure had been running low.  He denies any complaints of pain from the falls.  Blood glucose 160 by EMS.  The history is provided by the patient.  Fall  Weakness     Past Medical History:  Diagnosis Date  . Anemia    STARTED PILLS 1 MONTH AGO NEW DX  . Aortic insufficiency    mild, moderate  . Arthritis    FINGERS SWELL PT THINKS ARTHITIS, TOP OF WRIST ALSO SWELLS ONCE IN A WHILE  . Bleeds easily ([place])    BRUISE EASY ALSO  . Chronic kidney disease   . Cough    LAST 3 TO 4 WEEKS PHLEGM YELLOW INTERMITTENT COUGH NO FEVER  . Dyslipidemia   . H/O dizziness    OCC   . H/O Doppler ultrasound 2010  . H/O echocardiogram 2013   aortic valve disorder, EF =>55%  . H/O echocardiogram 2012   AVD, EF =>55%  . Heart murmur   . History of hiatal hernia   . HTN (hypertension)   . Inguinal hernia   . Mitral regurgitation    mild  . Pneumonia FEW YEARS AGO   X 1  . Post-polio syndrome age 27  . Seizures ([place])     last sz 2016 NO CAUSE FOUND, 3 seizures on 03/25/18   . Swelling of both lower extremities    DOES NOT ALWAYS DO DOWN   . Tricuspid regurgitation    mild    Patient Active Problem List   Diagnosis Date Noted  . Hemorrhagic stroke ([place]) 08/21/2020  . Acute diastolic CHF (congestive heart failure) ([place])   . History of hemiarthroplasty of left hip 02/13/2018  . Closed fracture of neck of left femur ([place]) 02/12/2018  . Moderate aortic regurgitation 02/12/2018  . CKD (chronic kidney disease), stage III ([place]) 02/12/2018  . Displaced fracture of left femoral neck ([place]) 02/12/2018  . Left displaced femoral neck fracture ([place]) 02/12/2018  . Arterial insufficiency of lower extremity ([place]) 11/22/2017  . Bilateral inguinal hernia (BIH) s/p lap hernia repair w mesh 08/17/2017 08/17/2017  . Femoral hernia - right - s/p lap hernia repair w mesh 08/17/2017 08/17/2017  . Dysphagia 04/06/2016  . Partial symptomatic epilepsy with complex partial seizures, not intractable, without status epilepticus ([place]) 11/16/2015  . Normocytic anemia 10/13/2015  . Seizure disorder ([place]) 05/12/2015  . Edema of lower extremity 04/09/2015  . Benign essential hypertension 04/09/2015  . Pleural effusion, right 04/09/2015  .  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 Memory loss 04/07/2015  . Mixed hyperlipidemia 04/07/2015  . Pain of left thumb 04/07/2015  . Simple partial seizure disorder ([place]) 04/07/2015  . Skin lesion of right arm 04/07/2015  . Transient alteration of awareness 04/07/2015    Past Surgical History:  Procedure Laterality Date  . COLONSCOPY  5 YRS AGO  . DENTAL SURGERY  3-4 WEEKS AGO  . FEMORAL HERNIA REPAIR Right 08/17/2017   Procedure: HERNIA REPAIR FEMORAL LAPAROSCOPIC;  Surgeon: Michael Boston, MD;  Location: [facility]S;  Service: General;  Laterality: Right;  . INGUINAL HERNIA REPAIR Bilateral 08/17/2017   Procedure: LAPAROSCOPIC BILATERAL INGUINAL HERNIA REPAIR;  Surgeon: Michael Boston, MD;  Location: [facility]  [facility];  Service: General;  Laterality: Bilateral;  . INSERTION OF MESH Bilateral 08/17/2017   Procedure: INSERTION OF MESH;  Surgeon: Michael Boston, MD;  Location: [facility];  Service: General;  Laterality: Bilateral;  . TOTAL HIP ARTHROPLASTY Left 02/13/2018   Procedure: HEMI  ARTHROPLASTY;  Surgeon: Paralee Cancel, MD;  Location: [facility];  Service: Orthopedics;  Laterality: Left;       Family History  Problem Relation Age of Onset  . Cancer Mother   . Heart disease Father   . Cancer Maternal Grandmother   . Cancer Maternal Grandfather   . Kidney failure Sister     Social History   Tobacco Use  . Smoking status: Former    Packs/day: 0.50    Years: 15.00    Pack years: 7.50    Types: Cigarettes    Quit date: 03/01/1963    Years since quitting: 57.5  . Smokeless tobacco: Never  Vaping Use  . Vaping Use: Never used  Substance Use Topics  . Alcohol use: No  . Drug use: No    Home Medications Prior to Admission medications   Medication Sig Start Date End Date Taking? Authorizing Provider  Ascorbic Acid (VITAMIN C) 1000 MG tablet Take 1,000 mg by mouth daily.    [provider]  aspirin EC 81 MG tablet Take 1 tablet (81 mg total) by mouth every other day. 11/21/18   Croitoru, Mihai, MD  carbamazepine (CARBATROL) 100 MG 12 hr capsule Take 1 capsule (100 mg total) by mouth 2 (two) times daily. 01/27/20   Penumalli, Earlean Polka, MD  Cholecalciferol (VITAMIN D-3) 1000 UNITS CAPS Take 1,000 Units by mouth daily.     [provider]  Dupilumab (DUPIXENT) 300 MG/2ML SOPN Inject into the skin. Every 2 weeks,injection    [provider]  ezetimibe (ZETIA) 10 MG tablet TAKE 1 TABLET BY MOUTH ONCE DAILY. THIS IS TO REPLACE THE ATORVASTATIN 07/20/20   Croitoru, Mihai, MD  hydrochlorothiazide (HYDRODIURIL) 12.5 MG tablet Take 6.25 mg by mouth daily. 08/05/20   [provider]  imiquimod (ALDARA) 5 % cream APPLY ON THE SKIN AS DIRECTED EVERY OTHER NIGHT FOR 4 WEEKS  10/30/18   [provider]  levETIRAcetam (KEPPRA) 750 MG tablet Take 2 tablets (1,500 mg total) by mouth 2 (two) times daily. 01/27/20   Penumalli, Earlean Polka, MD  lisinopril-hydrochlorothiazide (PRINZIDE,ZESTORETIC) 20-25 MG tablet Take 1 tablet by mouth daily.  05/24/17   [provider]  Magnesium 250 MG TABS Take 250 mg by mouth daily.    [provider]  oxyCODONE (OXY IR/ROXICODONE) 5 MG immediate release tablet Take 5 mg by mouth 4 (four) times daily as needed. 08/19/20   [provider]  tacrolimus (PROTOPIC) 0.1 % ointment Apply topically 2 (two) times daily as  needed. 05/28/20   [provider]  triamcinolone ointment (KENALOG) 0.1 % APPLY OINTMENT TOPICALLY TWICE DAILY AS NEEDED FOR FLARES 07/03/18   [provider]  vitamin B-12 (CYANOCOBALAMIN) 1000 MCG tablet Take 1,000 mcg by mouth daily.    [provider]  vitamin E 400 UNIT capsule Take 400 Units by mouth daily.     [provider]    Allergies    Patient has no known allergies.  Review of Systems   Review of Systems  Neurological:  Positive for weakness.  All other systems reviewed and are negative except that which was mentioned in HPI  Physical Exam Updated Vital Signs BP 124/68   Pulse 85   Temp 98.5 F (36.9 C) (Oral)   Resp 14   SpO2 94%   Physical Exam Vitals and nursing note reviewed.  Constitutional:      General: He is not in acute distress.    Comments: Frail, elderly man awake and alert  HENT:     Head: Normocephalic.     Comments: Healing abrasions L forehead    Nose: Nose normal.  Eyes:     Conjunctiva/sclera: Conjunctivae normal.     Pupils: Pupils are equal, round, and reactive to light.  Cardiovascular:     Rate and Rhythm: Normal rate and regular rhythm.     Heart sounds: Normal heart sounds. No murmur heard. Pulmonary:     Effort: Pulmonary effort is normal.     Breath sounds: Normal breath sounds.  Abdominal:      General: Abdomen is flat. Bowel sounds are normal. There is no distension.     Palpations: Abdomen is soft.     Tenderness: There is no abdominal tenderness.  Musculoskeletal:        General: No tenderness or signs of injury. Normal range of motion.     Cervical back: Neck supple.     Right lower leg: Edema present.     Left lower leg: Edema present.  Skin:    General: Skin is warm and dry.     Comments: Bandages over b/l forearms due to skin tears  Neurological:     Mental Status: He is alert and oriented to person, place, and time.     Comments: fluent  Psychiatric:        Mood and Affect: Mood normal.        Behavior: Behavior normal.    ED Results / Procedures / Treatments   Labs (all labs ordered are listed, but only abnormal results are displayed) Labs Reviewed  BASIC METABOLIC PANEL - Abnormal; Notable for the following components:      Result Value   Sodium 133 (*)    Chloride 97 (*)    Glucose, Bld 158 (*)    BUN 36 (*)    Creatinine, Ser 1.41 (*)    GFR, Estimated 47 (*)    All other components within normal limits  CBC - Abnormal; Notable for the following components:   RBC 3.60 (*)    Hemoglobin 11.0 (*)    HCT 33.2 (*)    All other components within normal limits  URINALYSIS, ROUTINE W REFLEX MICROSCOPIC - Abnormal; Notable for the following components:   APPearance HAZY (*)    Protein, ur 30 (*)    All other components within normal limits  HEPATIC FUNCTION PANEL - Abnormal; Notable for the following components:   Albumin 2.9 (*)    All other components within normal limits  CBG  MONITORING, ED - Abnormal; Notable for the following components:   Glucose-Capillary 163 (*)    All other components within normal limits  SARS CORONAVIRUS 2 (TAT 6-24 HRS)    EKG EKG Interpretation  Date/Time:  Friday August 21 2020 16:26:01 EDT Ventricular Rate:  82 PR Interval:  216 QRS Duration: 74 QT Interval:  345 QTC Calculation: 403 R Axis:   -18 Text  Interpretation: Sinus rhythm Borderline prolonged PR interval Borderline left axis deviation Low voltage, precordial leads ST elevation, consider inferior injury some artifact, no ischemic changes compared to previous Confirmed by Theotis Burrow [phone]) on 08/21/2020 5:00:55 PM  Radiology DG Chest 2 View  Result Date: 08/21/2020 CLINICAL DATA:  Chest pain. Multiple falls this week. EXAM: CHEST - 2 VIEW COMPARISON:  CTA chest, abdomen, and pelvis dated Jul 19, 2020. FINDINGS: The heart size and mediastinal contours are within normal limits. Normal pulmonary vascularity. Small right greather than left pleural effusions with associated right greater than left lower lobe atelectasis. No pneumothorax. No acute osseous abnormality. IMPRESSION: Right greater than left pleural effusions and bibasilar atelectasis. Electronically Signed   By: Titus Dubin M.D.   On: 08/21/2020 19:53   CT Head Wo Contrast  Result Date: 08/21/2020 CLINICAL DATA:  Multiple fall EXAM: CT HEAD WITHOUT CONTRAST TECHNIQUE: Contiguous axial images were obtained from the base of the skull through the vertex without intravenous contrast. COMPARISON:  CT brain 07/19/2020, MRI 12/08/2019 FINDINGS: Brain: Negative for intracranial mass. Moderate atrophy and chronic small vessel ischemic change of the white matter. Hypodensity in the right basal ganglia with faint internal hyperdensity, contiguous with mild hyperdensity adherent to the lateral wall of the right anterior ventricle, series 2, image 22. Stable ventricle size. Vascular: No hyperdense vessel.  Carotid vascular calcification Skull: Normal. Negative for fracture or focal lesion. Sinuses/Orbits: No acute finding. Other: None IMPRESSION: 1. Increased hypodensity within the right basal ganglia which appears to be associated with faint hyperdensity, suggestive of subacute hemorrhage or infarct. There appears to be a small amount of thrombus adherent to the lateral wall of the right  anterior ventricle. No mass effect. No midline shift. 2. Atrophy and chronic small vessel ischemic changes of the white matter Critical Value/emergent results were called by telephone at the time of interpretation on 08/21/2020 at 6:26 pm to provider Travarus Trudo , who verbally acknowledged these results. Electronically Signed   By: Donavan Foil M.D.   On: 08/21/2020 18:26   MR BRAIN W WO CONTRAST  Result Date: 08/21/2020 CLINICAL DATA:  Initial evaluation for acute stroke. EXAM: MRI HEAD WITHOUT AND WITH CONTRAST TECHNIQUE: Multiplanar, multiecho pulse sequences of the brain and surrounding structures were obtained without and with intravenous contrast. CONTRAST:  6.67mL GADAVIST GADOBUTROL 1 MMOL/ML IV SOLN COMPARISON:  Prior CT from earlier the same day as well as earlier studies. FINDINGS: Brain: Diffuse prominence of the CSF containing spaces compatible generalized cerebral atrophy. Patchy T2/FLAIR hyperintensity within the periventricular and deep white matter both cerebral hemispheres most consistent with chronic small vessel ischemic disease, moderate in nature. Abnormal T2/FLAIR signal abnormality seen involving the right basal ganglia, with involvement of the right caudate and lentiform nuclei. Associated irregular postcontrast enhancement. Scattered areas of internal susceptibility artifact present as well. Overall appearance is felt to be most consistent with an evolving subacute hemorrhagic ischemic infarct. 9 mm soft tissue density with associated susceptibility artifact positioned along the wall of the right lateral ventricle likely reflects evolving hemorrhage, also seen on prior CT (series 17,  image 88). No significant regional mass effect. Additional subtle hazy diffusion abnormality with mild enhancement noted involving the periventricular white matter adjacent to the frontal horn of the left lateral ventricle (series 5, image 73). Finding also suspicious for evolving subacute small vessel  ischemia. No other diffusion abnormality to suggest acute or subacute ischemia. Gray-white matter differentiation otherwise maintained. No other evidence for acute or chronic intracranial hemorrhage. No mass lesion, mass effect, or midline shift. Mild ventricular prominence related to global parenchymal volume loss without hydrocephalus. No extra-axial fluid collection. Pituitary gland suprasellar region within normal limits. Midline structures intact. No other abnormal enhancement. Vascular: Major intracranial vascular flow voids are maintained. Skull and upper cervical spine: Craniocervical junction within normal limits. Bone marrow signal intensity normal. Small lipoma noted at the left frontal scalp. Sinuses/Orbits: Globes and orbital soft tissues demonstrate no acute finding. Patient status post bilateral ocular lens replacement. Scattered mucosal thickening noted within the ethmoidal air cells as well as the left sphenoid and maxillary sinuses. No mastoid effusion. Inner ear structures grossly normal. Other: None. IMPRESSION: 1. Findings most consistent with an evolving subacute hemorrhagic right basal ganglia infarct as above. 9 mm soft tissue density along the lateral wall of the right lateral ventricle consistent with associated hemorrhage, corresponding with abnormality on prior CT. While these findings are favored to be vascular in ischemic in nature, a short interval follow-up MRI to ensure these changes resolve is recommended. 2. Additional subtle hazy diffusion abnormality involving the periventricular white matter adjacent to the frontal horn of the left lateral ventricle, also suspicious for evolving subacute small vessel ischemia. 3. Underlying age-related cerebral atrophy with chronic microvascular ischemic disease. Electronically Signed   By: Jeannine Boga M.D.   On: 08/21/2020 22:40    Procedures Procedures   Medications Ordered in ED Medications   stroke: mapping our early stages  of recovery book (has no administration in time range)  acetaminophen (TYLENOL) tablet 650 mg (has no administration in time range)    Or  acetaminophen (TYLENOL) 160 MG/5ML solution 650 mg (has no administration in time range)    Or  acetaminophen (TYLENOL) suppository 650 mg (has no administration in time range)  senna-docusate (Senokot-S) tablet 1 tablet (1 tablet Oral Given 08/21/20 2235)  pantoprazole (PROTONIX) injection 40 mg (40 mg Intravenous Given 08/21/20 2235)  tacrolimus (PROTOPIC) 0.1 % ointment (has no administration in time range)  lisinopril-hydrochlorothiazide (ZESTORETIC) 20-25 MG per tablet 1 tablet (has no administration in time range)  hydrochlorothiazide (HYDRODIURIL) tablet 6.25 mg (has no administration in time range)  carbamazepine (TEGRETOL XR) 12 hr tablet 100 mg (100 mg Oral Given 08/21/20 2316)  ezetimibe (ZETIA) tablet 10 mg (has no administration in time range)  levETIRAcetam (KEPPRA) tablet 1,500 mg (1,500 mg Oral Given 08/21/20 2315)  gadobutrol (GADAVIST) 1 MMOL/ML injection 6.5 mL (6.5 mLs Intravenous Contrast Given 08/21/20 2121)    ED Course  I have reviewed the triage vital signs and the nursing notes.  Pertinent labs & imaging results that were available during my care of the patient were reviewed by me and considered in my medical decision making (see chart for details).    MDM Rules/Calculators/A&P                          Awake, alert, comfortable on exam, normal VS. No focal areas of pain from falls. Because of evidence of old head injury on exam, obtained CT head as well as screening labs.  Lab work overall reassuring.  Head CT shows increased hypodensity in right basal ganglia, possible subacute hemorrhage with associated thrombus, this appears different from previous imaging in May.  Discussed with radiologist as well as neurologist Dr. Quinn Axe.  She recommended admission and brain MRI. Discussed admission for [facility] with Dr. Alcario Drought, [facility]. Final  Clinical Impression(s) / ED Diagnoses Final diagnoses:  None    Rx / DC Orders ED Discharge Orders     None        Casady Voshell, Wenda Overland, MD 08/21/20 2353

## 2020-08-21 NOTE — ED Notes (Signed)
Lynelle Smoke (daughter) [phone]

## 2020-08-22 ENCOUNTER — Inpatient Hospital Stay (HOSPITAL_COMMUNITY): Payer: Medicare Other

## 2020-08-22 ENCOUNTER — Other Ambulatory Visit (HOSPITAL_COMMUNITY): Payer: Medicare Other

## 2020-08-22 DIAGNOSIS — N1831 Chronic kidney disease, stage 3a: Secondary | ICD-10-CM

## 2020-08-22 DIAGNOSIS — I619 Nontraumatic intracerebral hemorrhage, unspecified: Secondary | ICD-10-CM

## 2020-08-22 DIAGNOSIS — I6389 Other cerebral infarction: Secondary | ICD-10-CM

## 2020-08-22 LAB — HEMOGLOBIN A1C
Hgb A1c MFr Bld: 5.5 % (ref 4.8–5.6)
Mean Plasma Glucose: 111.15 mg/dL

## 2020-08-22 LAB — ECHOCARDIOGRAM COMPLETE
AR max vel: 2.48 cm2
AV Area VTI: 2.25 cm2
AV Area mean vel: 2.45 cm2
AV Mean grad: 5.5 mmHg
AV Peak grad: 11.1 mmHg
Ao pk vel: 1.66 m/s
P 1/2 time: 528 msec
S' Lateral: 2.7 cm

## 2020-08-22 LAB — TSH: TSH: 4.153 u[IU]/mL (ref 0.350–4.500)

## 2020-08-22 LAB — SARS CORONAVIRUS 2 (TAT 6-24 HRS): SARS Coronavirus 2: NEGATIVE

## 2020-08-22 LAB — VITAMIN B12: Vitamin B-12: 1518 pg/mL — ABNORMAL HIGH (ref 180–914)

## 2020-08-22 MED ORDER — CHLORPROMAZINE HCL 25 MG PO TABS
25.0000 mg | ORAL_TABLET | Freq: Three times a day (TID) | ORAL | Status: DC | PRN
  Filled 2020-08-22: qty 1

## 2020-08-22 MED ORDER — ENSURE ENLIVE PO LIQD
237.0000 mL | Freq: Two times a day (BID) | ORAL | Status: DC
  Administered 2020-08-23 – 2020-08-26 (×5): 237 mL via ORAL
  Filled 2020-08-22 (×2): qty 237

## 2020-08-22 MED ORDER — ADULT MULTIVITAMIN W/MINERALS CH
1.0000 | ORAL_TABLET | Freq: Every day | ORAL | Status: DC
  Administered 2020-08-22 – 2020-08-26 (×5): 1 via ORAL
  Filled 2020-08-22 (×4): qty 1

## 2020-08-22 MED ORDER — LISINOPRIL-HYDROCHLOROTHIAZIDE 20-25 MG PO TABS: 1.0000 | ORAL_TABLET | Freq: Every day | ORAL | Status: DC

## 2020-08-22 MED ORDER — IOHEXOL 350 MG/ML SOLN
100.0000 mL | Freq: Once | INTRAVENOUS | Status: AC | PRN
  Administered 2020-08-22: 100 mL via INTRAVENOUS

## 2020-08-22 NOTE — Progress Notes (Signed)
Received report from [facility] [employer]. Patient is coming from [facility], waiting for transport.

## 2020-08-22 NOTE — Consult Note (Signed)
Neurology Consultation  Reason for Consult: Subacute stroke on MRI brain.  Referring Physician: Dr. Pearletha Forge.   CC: Subacute stroke on MRI brain.   History is obtained from: Patient, daughters at bedside, chart.   HPI: Charles Randall. is a 87 y.o. 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 Upon workup, MRI brain was + for a right basal ganglia subacute hemorrhagic infarct and a vascular subacute infarct adjacent to the lateral horn of the left ventricle.   Patient was then transferred to [facility]. Per daughters, patient has been weak for months, but acutely worse in past 1-2 weeks. States he was able to drive and mow his lawn one year ago. Recently, he has required help from his live in girlfriend to dress, toilet, and ambulate which is not his norm. He uses a walker and still needs assistance to safely ambulate. His gait is affected by his history of post polio syndrome, but acutely worse recently. Oxycodone is on his PTA medication list, but daughters state they did not get it filled. Advised against use of opioids.   Today, patient denies pain except with hiccups he has had on and off x 1 month. His daughters thought he may have fractured a rib. His CXR is negative for fracture. He has not had much po intake in past week or two. No particular reason, just has not felt like eating. However, daughters state that he has difficulty swallowing at times.   In review of chart, he saw Dr. Leta Baptist at [facility] in Nov 2021 to f/up microvascular infarction of OS. He was stable after event and his ASA 81mg  was continued. No seizure since 2020 and AEDs were kept same. His left oculomotor nerve palsy was stable.   Because of the incidental finding of right basal ganglia  hemorrhagic stroke found on MRI brain, neurology was asked to consult.   ROS: A robust ROS was performed and is negative except as noted in the HPI.  Past Medical History:  Diagnosis Date   Anemia    STARTED PILLS 1 MONTH AGO NEW DX   Aortic insufficiency    mild, moderate   Arthritis    FINGERS SWELL PT THINKS ARTHITIS, TOP OF WRIST ALSO SWELLS ONCE IN A WHILE   Bleeds easily (HCC)    BRUISE EASY ALSO   Chronic kidney disease    Cough    LAST 3 TO 4 WEEKS PHLEGM YELLOW INTERMITTENT COUGH NO FEVER   Dyslipidemia    H/O dizziness    OCC    H/O Doppler ultrasound 2010   H/O echocardiogram 2013   aortic valve disorder, EF =>55%   H/O echocardiogram 2012   AVD, EF =>55%   Heart murmur    History of hiatal hernia    HTN (hypertension)    Inguinal hernia    Mitral regurgitation    mild   Pneumonia FEW YEARS AGO   X 1   Post-polio syndrome age 21   Seizures ([place])    last sz 2016 NO CAUSE FOUND, 3 seizures on 03/25/18    Swelling of both lower extremities    DOES NOT ALWAYS DO DOWN    Tricuspid regurgitation    mild    Family History  Problem Relation Age of Onset  Cancer Mother    Heart disease Father    Cancer Maternal Grandmother    Cancer Maternal Grandfather    Kidney failure Sister     Social History:   reports that he quit smoking about 57 years ago. He has a 7.50 pack-year smoking history. He has never used smokeless tobacco. He reports that he does not drink alcohol and does not use drugs.  Medications  Current Facility-Administered Medications:     stroke: mapping our early stages of recovery book, , Does not apply, Once, Charles Randall, Charles M, DO   acetaminophen (TYLENOL) tablet 650 mg, 650 mg, Oral, Q4H PRN **OR** acetaminophen (TYLENOL) 160 MG/5ML solution 650 mg, 650 mg, Per Tube, Q4H PRN **OR** acetaminophen (TYLENOL) suppository 650 mg, 650 mg, Rectal, Q4H PRN, Charles Randall, Charles M, DO   carbamazepine (TEGRETOL XR) 12 hr tablet 100 mg, 100 mg, Oral, BID,  Charles Randall, Charles M, DO, 100 mg at 08/21/20 2316   ezetimibe (ZETIA) tablet 10 mg, 10 mg, Oral, Daily, Charles Randall, Charles M, DO, 10 mg at 08/22/20 1149   hydrochlorothiazide (HYDRODIURIL) tablet 6.25 mg, 6.25 mg, Oral, Daily, Charles Randall, Charles M, DO   levETIRAcetam (KEPPRA) tablet 1,500 mg, 1,500 mg, Oral, BID, Charles Randall, Charles M, DO, 1,500 mg at 08/22/20 1149   pantoprazole (PROTONIX) injection 40 mg, 40 mg, Intravenous, QHS, Charles Randall, Charles M, DO, 40 mg at 08/21/20 2235   senna-docusate (Senokot-S) tablet 1 tablet, 1 tablet, Oral, BID, Charles Quill, DO, 1 tablet at 08/21/20 2235   Exam: Current vital signs: BP 120/83 (BP Location: Right Arm)   Pulse 84   Temp 99.5 F (37.5 C) (Oral)   Resp 16   SpO2 99%  Vital signs in last 24 hours: Temp:  [98.4 F (36.9 C)-99.5 F (37.5 C)] 99.5 F (37.5 C) (06/25 0800) Pulse Rate:  [73-87] 84 (06/25 0800) Resp:  [13-23] 16 (06/25 0800) BP: (107-145)/(56-83) 120/83 (06/25 0800) SpO2:  [90 %-99 %] 99 % (06/25 0800)  PE: GENERAL: Fairly well appearing elderly male in NAD. Alert.  HEENT: - Healing abrasions to head, mostly left forehead area.  LUNGS - Normal respiratory effort.  CV - RRR on tele. ABDOMEN - Soft, nontender. Ext: warm, well perfused. LLE smaller than RLE. Left foot drop.  Psych: affect light, calm, cooperative.  NEURO:  Mental Status: AA&Ox3  Speech/Language: speech is without dysarthria or aphasia.  Naming, repetition, fluency, and comprehension intact.  Cranial Nerves:  II: OS pupil is not reactive. Left eye deviated laterally and slightly inferiorly.   III, IV, VI: EOMI. Slight OD ptosis.  V: sensation is intact and symmetrical to face.  VII: Slight left facial droop noted. Mild weakness on left with cheek puffing.   VIII:hearing intact to voice IX, X: palate elevation is symmetric. Phonation normal.  XI: normal sternocleidomastoid and trapezius muscle strength ASN:KNLZJQ is symmetrical without fasciculations.   Motor: RUE:  grips  5/5      triceps 5/5   biceps  5/5           LUE: grips  4+/5     triceps  4+/5    biceps   4+/5  RLE:  knee  4+/5  thigh  4+/5  plantar flexion 3+/5 dorsiflexion   3+/5  LLE:  knee  4-/5   thigh  4-/5    plantar flexion  1/5  dorsiflexion   1/5 Tone is normal. Bulk is decreased to the LLE.  Sensation- Intact to light touch bilaterally in all four extremities. Extinction absent  to light touch to DSS.  Coordination: FTN intact bilaterally. No UE pronator drift.  DTRs: BUE 1 +   BLE 0 Gait- deferred  NIHSS:  1a Level of Consciousness: 0 1b LOC Questions: 0 1c LOC Commands: 0 2 Best Gaze: 0 3 Visual: 0 4 Facial Palsy: 1 5a Motor Arm - left: 0 5b Motor Arm - Right: 0 6a Motor Leg - Left: 2 6b Motor Leg - Right: 1 7 Limb Ataxia: 0 8 Sensory: 0 9 Best Language: 0 10 Dysarthria: 0 11 Extinction and Inattention: 0 TOTAL: 4  Labs I have reviewed labs in epic and the results pertinent to this consultation are:   CBC    Component Value Date/Time   WBC 9.2 08/21/2020 1630   RBC 3.60 (L) 08/21/2020 1630   HGB 11.0 (L) 08/21/2020 1630   HCT 33.2 (L) 08/21/2020 1630   PLT 350 08/21/2020 1630   MCV 92.2 08/21/2020 1630   MCH 30.6 08/21/2020 1630   MCHC 33.1 08/21/2020 1630   RDW 13.5 08/21/2020 1630   LYMPHSABS 0.6 (L) 07/19/2020 1517   MONOABS 0.8 07/19/2020 1517   EOSABS 0.3 07/19/2020 1517   BASOSABS 0.0 07/19/2020 1517    CMP     Component Value Date/Time   NA 133 (L) 08/21/2020 1630   K 3.9 08/21/2020 1630   CL 97 (L) 08/21/2020 1630   CO2 28 08/21/2020 1630   GLUCOSE 158 (H) 08/21/2020 1630   BUN 36 (H) 08/21/2020 1630   CREATININE 1.41 (H) 08/21/2020 1630   CREATININE 2.11 (H) 02/16/2016 1144   CALCIUM 9.1 08/21/2020 1630   PROT 6.5 08/21/2020 1645   ALBUMIN 2.9 (L) 08/21/2020 1645   AST 22 08/21/2020 1645   ALT 18 08/21/2020 1645   ALKPHOS 75 08/21/2020 1645   BILITOT 0.4 08/21/2020 1645   GFRNONAA 47 (L) 08/21/2020 1630   GFRAA 53 (L) 02/19/2018  1508    Lipid Panel     Component Value Date/Time   CHOL 183 01/27/2020 1529   TRIG 72 01/27/2020 1529   HDL 70 01/27/2020 1529   CHOLHDL 2.6 01/27/2020 1529   LDLCALC 100 (H) 01/27/2020 1529   Imaging  CT head 1. Increased hypodensity within the right basal ganglia which appears to be associated with faint hyperdensity, suggestive of subacute hemorrhage or infarct. There appears to be a small amount of thrombus adherent to the lateral wall of the right anterior ventricle. No mass effect. No midline shift. 2. Atrophy and chronic small vessel ischemic changes of the white matter  MRI brain 1. Findings most consistent with an evolving subacute hemorrhagic right basal ganglia infarct as above. 9 mm soft tissue density along the lateral wall of the right lateral ventricle consistent with associated hemorrhage, corresponding with abnormality on prior CT. While these findings are favored to be vascular in ischemic in nature, a short interval follow-up MRI to ensure these changes resolve is recommended. 2. Additional subtle hazy diffusion abnormality involving the periventricular white matter adjacent to the frontal horn of the left lateral ventricle, also suspicious for evolving subacute small vessel ischemia. 3. Underlying age-related cerebral atrophy with chronic microvascular ischemic disease.  Assessment: 87 yo male who presented to [facility] ED yesterday with complaints of generalized weakness and recent falls. MRI brain positive as above. Both strokes are subacute. His exam today shows more weakness to LLE than per last neurology out patient note. His LLE weakness can be contributed to by his right basal ganglia hemorrhagic infarct. 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 The small subacute infarct to frontal horn of the left lateral ventricle is likely contributing to his RLE weakness.  Do not feel an EEG is needed at this time, as no seizure like activity  has been witnessed and he has been well controlled with home AEDs without seizure in 2 years.   Impression: -right basal ganglia subacute hemorrhagic infarct.  -ischemic subacute infarct to the frontal horn of the left lateral ventricle.  -Absence seizure disorder without evidence of seizure activity.   Recommendations/Plan:  - medicine admit for stroke work up.  - Fall precautions.  - PT/INR/PTT, HgbA1c, fasting lipid panel. - high intensity statin or increase dose if LDL>70. - BP goal is less than 160.  - CTA head and neck.  - Frequent neuro checks. - follow NIHSS. - Echocardiogram. - No antiplatelet therapy or AC for now.  - Risk factor modification - cardiac telemetry monitoring for arrhythmia - PT consult, OT consult, Speech consult. - Stroke education. - Continue AEDs per home dose.  - Metabolic and infectious workup.  - Stroke team to follow.  Pt seen by Charles Boll, NP/Neuro and later by MD. Note/plan to be edited by MD as needed.  Pager: [phone]    NEUROHOSPITALIST ADDENDUM Performed a face to face diagnostic evaluation.   I have reviewed the contents of history and physical exam as documented by PA/ARNP/Resident and agree with above documentation.  I have discussed and formulated the above plan as documented. Edits to the note have been made as needed.  Impression/Key exam findings/Plan: 87 y/o Randall with hx of polio in childhood who presents with worsening left sided weakness and left facial droop along with R foot drop too. CT Head with R BG which likely explains his left face, arm and leg weakness with L foot drop and a L frontal periventricular stroke likely explains his R foot drop. Daughters concerned about safety at home, he does have a history of falls with a hip fracture.  Will get stroke workup. Will need Pt and OT to assess dispo. Has had persistent hiccups and I think Chlorpromazine is going to be helpful. Continue home AEDs.  Charles Simpers,  MD [facility] [phone]   If 7pm to 7am, please call on call as listed on AMION.

## 2020-08-22 NOTE — Progress Notes (Signed)
PROGRESS NOTE    Charles Randall.  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  CT scan of the head revealed subacute right basal ganglia hemorrhagic infarct.  Patient was admitted to the hospital for further work-up.   Assessment & Plan:   Principal Problem:   Hemorrhagic stroke [facility]) Active Problems:   Benign essential hypertension   Seizure disorder (HCC)   CKD (chronic kidney disease), stage III (HCC)   Subacute hemorrhagic infarct of right basal ganglia Ischemic subacute infarct of the frontal horn of left lateral ventricle -Seen by neurology -No antiplatelet agents in the setting of bleeding -LDL pending -A1c 5.5 -2D echocardiogram with normal EF, without any signs of embolus -Blood pressure goal less than 160 -Seen by OT with recommendations for CIR -PT eval pending -He passed bedside swallow screen, but daughter reports that he has had problems swallowing lately.  Speech therapy has been consulted.  Seizure disorder -Continue on Keppra and carbamazepine  Chronic kidney disease stage IIIa -Creatinine is currently at baseline -Continue to monitor  Hypertension -Chronically on lisinopril/hydrochlorothiazide -Blood pressure stable -Can resume blood pressure medications if blood pressure starts to trend up  Left kidney mass -Noted on CT scan from 06/2020 -Can consider nonemergent MRI abdomen if family desires further work-up  Thoracic aortic aneurysm -Noted on CT scan from 06/2020, measured at 4.7 cm -Can consider further surveillance as an outpatient, although I would not anticipate that he be a candidate for any surgical intervention due to his advanced age  Intractable hiccups -Patient reports  history of intractable hiccups for the past 2 to 3 weeks -Continue on Protonix -Started on Thorazine as needed  DVT prophylaxis: SCD's Start: 08/21/20 2012  Code Status: DNR Family Communication: Updated patient's daughter Tammy Elders, who is patient's POA Disposition Plan: Status is: Inpatient  Remains inpatient appropriate because:Ongoing diagnostic testing needed not appropriate for outpatient work up and Unsafe d/c plan  Dispo: The patient is from: Home              Anticipated d/c is to: CIR versus SNF              Patient currently is not medically stable to d/c.   Difficult to place patient No         Consultants:  Neurology  Procedures:  Echocardiogram  Antimicrobials:      Subjective: Continues to feel weak in his legs, no chest pain, no headache, no shortness of breath  Objective: Vitals:   08/22/20 0800 08/22/20 1100 08/22/20 1258 08/22/20 1300  BP: 120/83 120/83 (!) 138/59 129/69  Pulse: 84  89 91  Resp: 16 (!) 22 19 (!) 21  Temp: 99.5 F (37.5 C) 99.5 F (37.5 C)    TempSrc: Oral Oral    SpO2: 99%  96% 96%   No intake or output data in the 24 hours ending 08/22/20 1743 There were no vitals filed for this visit.  Examination:  General exam: Appears calm and comfortable  Respiratory system: Clear to auscultation. Respiratory effort normal. Cardiovascular system: S1 & S2 heard, RRR. No JVD, murmurs, rubs, gallops or clicks. No pedal edema. Gastrointestinal system: Abdomen is nondistended, soft and nontender. No organomegaly or masses felt. Normal bowel sounds heard. Central nervous system: Alert and  oriented. 5/5  strength in upper extremities bilaterally, 3-5 strength in lower extremities bilaterally Extremities: Trace edema in feet bilaterally Skin: No rashes, lesions or ulcers Psychiatry: Judgement and insight appear normal. Mood & affect appropriate.     Data Reviewed: I have personally reviewed following labs and imaging  studies  CBC: Recent Labs  Lab 08/21/20 1630  WBC 9.2  HGB 11.0*  HCT 33.2*  MCV 92.2  PLT 932   Basic Metabolic Panel: Recent Labs  Lab 08/21/20 1630  NA 133*  K 3.9  CL 97*  CO2 28  GLUCOSE 158*  BUN 36*  CREATININE 1.41*  CALCIUM 9.1   GFR: CrCl cannot be calculated (Unknown ideal weight.). Liver Function Tests: Recent Labs  Lab 08/21/20 1645  AST 22  ALT 18  ALKPHOS 75  BILITOT 0.4  PROT 6.5  ALBUMIN 2.9*   No results for input(s): LIPASE, AMYLASE in the last 168 hours. No results for input(s): AMMONIA in the last 168 hours. Coagulation Profile: No results for input(s): INR, PROTIME in the last 168 hours. Cardiac Enzymes: No results for input(s): CKTOTAL, CKMB, CKMBINDEX, TROPONINI in the last 168 hours. BNP (last 3 results) No results for input(s): PROBNP in the last 8760 hours. HbA1C: Recent Labs    08/22/20 1458  HGBA1C 5.5   CBG: Recent Labs  Lab 08/21/20 1637  GLUCAP 163*   Lipid Profile: No results for input(s): CHOL, HDL, LDLCALC, TRIG, CHOLHDL, LDLDIRECT in the last 72 hours. Thyroid Function Tests: Recent Labs    08/22/20 1458  TSH 4.153   Anemia Panel: Recent Labs    08/22/20 1458  VITAMINB12 1,518*   Sepsis Labs: No results for input(s): PROCALCITON, LATICACIDVEN in the last 168 hours.  Recent Results (from the past 240 hour(s))  SARS CORONAVIRUS 2 (TAT 6-24 HRS) Nasopharyngeal Nasopharyngeal Swab     Status: None   Collection Time: 08/21/20  6:40 PM   Specimen: Nasopharyngeal Swab  Result Value Ref Range Status   SARS Coronavirus 2 NEGATIVE NEGATIVE Final    Comment: (NOTE) SARS-CoV-2 target nucleic acids are NOT DETECTED.  The SARS-CoV-2 RNA is generally detectable in upper and lower respiratory specimens during the acute phase of infection. Negative results do not preclude SARS-CoV-2 infection, do not rule out co-infections with other pathogens, and should not be used as the sole basis for treatment or other  patient management decisions. Negative results must be combined with clinical observations, patient history, and epidemiological information. The expected result is Negative.  Fact Sheet for Patients: SugarRoll.be  Fact Sheet for Healthcare Providers: https://www.woods-mathews.com/  This test is not yet approved or cleared by the [place] FDA and  has been authorized for detection and/or diagnosis of SARS-CoV-2 by FDA under an Emergency Use Authorization (EUA). This EUA will remain  in effect (meaning this test can be used) for the duration of the COVID-19 declaration under Se ction 564(b)(1) of the Act, 21 U.S.C. section 360bbb-3(b)(1), unless the authorization is terminated or revoked sooner.  Performed at [facility], [place] [address], [place], [place] [address]          Radiology Studies: DG Chest 2 View  Result Date: 08/21/2020 CLINICAL DATA:  Chest pain. Multiple falls this week. EXAM: CHEST - 2 VIEW COMPARISON:  CTA chest, abdomen, and pelvis dated Jul 19, 2020. FINDINGS: The heart size and mediastinal contours are within normal limits. Normal pulmonary vascularity. Small right greather than left pleural effusions with associated right greater than left lower lobe  atelectasis. No pneumothorax. No acute osseous abnormality. IMPRESSION: Right greater than left pleural effusions and bibasilar atelectasis. Electronically Signed   By: Titus Dubin M.D.   On: 08/21/2020 19:53   CT Head Wo Contrast  Result Date: 08/21/2020 CLINICAL DATA:  Multiple fall EXAM: CT HEAD WITHOUT CONTRAST TECHNIQUE: Contiguous axial images were obtained from the base of the skull through the vertex without intravenous contrast. COMPARISON:  CT brain 07/19/2020, MRI 12/08/2019 FINDINGS: Brain: Negative for intracranial mass. Moderate atrophy and chronic small vessel ischemic change of the white matter. Hypodensity in the right basal ganglia with faint  internal hyperdensity, contiguous with mild hyperdensity adherent to the lateral wall of the right anterior ventricle, series 2, image 22. Stable ventricle size. Vascular: No hyperdense vessel.  Carotid vascular calcification Skull: Normal. Negative for fracture or focal lesion. Sinuses/Orbits: No acute finding. Other: None IMPRESSION: 1. Increased hypodensity within the right basal ganglia which appears to be associated with faint hyperdensity, suggestive of subacute hemorrhage or infarct. There appears to be a small amount of thrombus adherent to the lateral wall of the right anterior ventricle. No mass effect. No midline shift. 2. Atrophy and chronic small vessel ischemic changes of the white matter Critical Value/emergent results were called by telephone at the time of interpretation on 08/21/2020 at 6:26 pm to provider RACHEL LITTLE , who verbally acknowledged these results. Electronically Signed   By: Donavan Foil M.D.   On: 08/21/2020 18:26   MR BRAIN W WO CONTRAST  Result Date: 08/21/2020 CLINICAL DATA:  Initial evaluation for acute stroke. EXAM: MRI HEAD WITHOUT AND WITH CONTRAST TECHNIQUE: Multiplanar, multiecho pulse sequences of the brain and surrounding structures were obtained without and with intravenous contrast. CONTRAST:  6.17mL GADAVIST GADOBUTROL 1 MMOL/ML IV SOLN COMPARISON:  Prior CT from earlier the same day as well as earlier studies. FINDINGS: Brain: Diffuse prominence of the CSF containing spaces compatible generalized cerebral atrophy. Patchy T2/FLAIR hyperintensity within the periventricular and deep white matter both cerebral hemispheres most consistent with chronic small vessel ischemic disease, moderate in nature. Abnormal T2/FLAIR signal abnormality seen involving the right basal ganglia, with involvement of the right caudate and lentiform nuclei. Associated irregular postcontrast enhancement. Scattered areas of internal susceptibility artifact present as well. Overall appearance  is felt to be most consistent with an evolving subacute hemorrhagic ischemic infarct. 9 mm soft tissue density with associated susceptibility artifact positioned along the wall of the right lateral ventricle likely reflects evolving hemorrhage, also seen on prior CT (series 17, image 88). No significant regional mass effect. Additional subtle hazy diffusion abnormality with mild enhancement noted involving the periventricular white matter adjacent to the frontal horn of the left lateral ventricle (series 5, image 73). Finding also suspicious for evolving subacute small vessel ischemia. No other diffusion abnormality to suggest acute or subacute ischemia. Gray-white matter differentiation otherwise maintained. No other evidence for acute or chronic intracranial hemorrhage. No mass lesion, mass effect, or midline shift. Mild ventricular prominence related to global parenchymal volume loss without hydrocephalus. No extra-axial fluid collection. Pituitary gland suprasellar region within normal limits. Midline structures intact. No other abnormal enhancement. Vascular: Major intracranial vascular flow voids are maintained. Skull and upper cervical spine: Craniocervical junction within normal limits. Bone marrow signal intensity normal. Small lipoma noted at the left frontal scalp. Sinuses/Orbits: Globes and orbital soft tissues demonstrate no acute finding. Patient status post bilateral ocular lens replacement. Scattered mucosal thickening noted within the ethmoidal air cells as well as the left sphenoid and maxillary  sinuses. No mastoid effusion. Inner ear structures grossly normal. Other: None. IMPRESSION: 1. Findings most consistent with an evolving subacute hemorrhagic right basal ganglia infarct as above. 9 mm soft tissue density along the lateral wall of the right lateral ventricle consistent with associated hemorrhage, corresponding with abnormality on prior CT. While these findings are favored to be vascular in  ischemic in nature, a short interval follow-up MRI to ensure these changes resolve is recommended. 2. Additional subtle hazy diffusion abnormality involving the periventricular white matter adjacent to the frontal horn of the left lateral ventricle, also suspicious for evolving subacute small vessel ischemia. 3. Underlying age-related cerebral atrophy with chronic microvascular ischemic disease. Electronically Signed   By: Jeannine Boga M.D.   On: 08/21/2020 22:40   ECHOCARDIOGRAM COMPLETE  Result Date: 08/22/2020    ECHOCARDIOGRAM REPORT   Patient Name:   Kerolos Nehme. Date of Exam: 08/22/2020 Medical Rec #:  [id number]         Height:       66.0 in Accession #:    [id number]        Weight:       140.0 lb Date of Birth:  [birth year]          BSA:          1.719 m Patient Age:    87 years          BP:           120/83 mmHg Patient Gender: M                 HR:           84 bpm. Exam Location:  Inpatient Procedure: 2D Echo, Cardiac Doppler and Color Doppler Indications:    Stroke  History:        Patient has prior history of Echocardiogram examinations, most                 recent 02/16/2018. Aortic Valve Disease; Risk                 Factors:Dyslipidemia and Former Smoker. CKD.  Sonographer:    Clayton Lefort RDCS (AE) Referring Phys: [place]  1. Left ventricular ejection fraction, by estimation, is 55 to 60%. The left ventricle has normal function. The left ventricle has no regional wall motion abnormalities. There is mild asymmetric left ventricular hypertrophy of the basal-septal segment. Left ventricular diastolic parameters are indeterminate.  2. Right ventricular systolic function is normal. The right ventricular size is normal. There is normal pulmonary artery systolic pressure. The estimated right ventricular systolic pressure is 01.7 mmHg.  3. The mitral valve is grossly normal. Mild mitral valve regurgitation. No evidence of mitral stenosis.  4. The aortic valve is  tricuspid. There is moderate calcification of the aortic valve. There is moderate thickening of the aortic valve. Aortic valve regurgitation is mild.  5. Aortic dilatation noted. There is mild dilatation of the aortic root, measuring 42 mm.  6. The inferior vena cava is normal in size with greater than 50% respiratory variability, suggesting right atrial pressure of 3 mmHg. Conclusion(s)/Recommendation(s): No intracardiac source of embolism detected on this transthoracic study. A transesophageal echocardiogram is recommended to exclude cardiac source of embolism if clinically indicated. FINDINGS  Left Ventricle: Left ventricular ejection fraction, by estimation, is 55 to 60%. The left ventricle has normal function. The left ventricle has no regional wall motion abnormalities. The left ventricular internal cavity size was  normal in size. There is  mild asymmetric left ventricular hypertrophy of the basal-septal segment. Left ventricular diastolic parameters are indeterminate. Right Ventricle: The right ventricular size is normal. No increase in right ventricular wall thickness. Right ventricular systolic function is normal. There is normal pulmonary artery systolic pressure. The tricuspid regurgitant velocity is 2.23 m/s, and  with an assumed right atrial pressure of 3 mmHg, the estimated right ventricular systolic pressure is 93.8 mmHg. Left Atrium: Left atrial size was normal in size. Right Atrium: Right atrial size was normal in size. Pericardium: Trivial pericardial effusion is present. Mitral Valve: The mitral valve is grossly normal. Mild mitral valve regurgitation. No evidence of mitral valve stenosis. Tricuspid Valve: The tricuspid valve is grossly normal. Tricuspid valve regurgitation is not demonstrated. No evidence of tricuspid stenosis. Aortic Valve: The aortic valve is tricuspid. There is moderate calcification of the aortic valve. There is moderate thickening of the aortic valve. Aortic valve  regurgitation is mild. Aortic regurgitation PHT measures 528 msec. Aortic valve mean gradient  measures 5.5 mmHg. Aortic valve peak gradient measures 11.1 mmHg. Aortic valve area, by VTI measures 2.25 cm. Pulmonic Valve: The pulmonic valve was grossly normal. Pulmonic valve regurgitation is not visualized. No evidence of pulmonic stenosis. Aorta: Aortic dilatation noted. There is mild dilatation of the aortic root, measuring 42 mm. Venous: The inferior vena cava is normal in size with greater than 50% respiratory variability, suggesting right atrial pressure of 3 mmHg. IAS/Shunts: The atrial septum is grossly normal.  LEFT VENTRICLE PLAX 2D LVIDd:         4.00 cm LVIDs:         2.70 cm LV PW:         1.20 cm LV IVS:        1.10 cm LVOT diam:     1.80 cm LV SV:         72 LV SV Index:   42 LVOT Area:     2.54 cm  RIGHT VENTRICLE             IVC RV Basal diam:  3.20 cm     IVC diam: 1.20 cm RV S prime:     16.30 cm/s TAPSE (M-mode): 2.0 cm LEFT ATRIUM           Index       RIGHT ATRIUM           Index LA diam:      2.30 cm 1.34 cm/m  RA Area:     14.70 cm LA Vol (A2C): 30.4 ml 17.69 ml/m RA Volume:   33.70 ml  19.61 ml/m LA Vol (A4C): 30.8 ml 17.92 ml/m  AORTIC VALVE AV Area (Vmax):    2.48 cm AV Area (Vmean):   2.45 cm AV Area (VTI):     2.25 cm AV Vmax:           166.25 cm/s AV Vmean:          109.000 cm/s AV VTI:            0.322 m AV Peak Grad:      11.1 mmHg AV Mean Grad:      5.5 mmHg LVOT Vmax:         162.00 cm/s LVOT Vmean:        105.000 cm/s LVOT VTI:          0.284 m LVOT/AV VTI ratio: 0.88 AI PHT:  528 msec  AORTA Ao Root diam: 4.20 cm TRICUSPID VALVE TR Peak grad:   19.9 mmHg TR Vmax:        223.00 cm/s  SHUNTS Systemic VTI:  0.28 m Systemic Diam: 1.80 cm Eleonore Chiquito MD Electronically signed by Eleonore Chiquito MD Signature Date/Time: 08/22/2020/1:23:54 PM    Final         Scheduled Meds:   stroke: mapping our early stages of recovery book   Does not apply Once   carbamazepine   100 mg Oral BID   ezetimibe  10 mg Oral Daily   feeding supplement  237 mL Oral BID BM   hydrochlorothiazide  6.25 mg Oral Daily   levETIRAcetam  1,500 mg Oral BID   multivitamin with minerals  1 tablet Oral Daily   pantoprazole (PROTONIX) IV  40 mg Intravenous QHS   senna-docusate  1 tablet Oral BID   Continuous Infusions:   LOS: 1 day    Time spent: 33mins    Kathie Dike, MD [facility]   If 7PM-7AM, please contact night-coverage [email or web address]  08/22/2020, 5:43 PM

## 2020-08-22 NOTE — Evaluation (Signed)
Occupational Therapy Evaluation Patient Details Name: Charles Randall. MRN: [id number] DOB: May 18, 1927 Today's Date: 08/22/2020    History of Present Illness 87 y.o. 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   Pt presents to ED with frequent falls onset over the past 1.5 weeks.  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   Clinical Impression   Patient admitted for the diagnosis above.  PTA he lives with his SO of 7 years, and has one daughter that lives in town, both can provide 24 hour assist, but cannot lift, or provide physical assist.  Daughter's describe declining mobility and weakness, with increasing falls recently leading to this hospitalization.  Barriers are listed below.  Currently, he is needing up to Max A for basic mobility, and ADL support.  The patient needs to be able to walk under his own power with RW to return home.  Patient would benefit from aggressive rehab to maximize his mobility and ADL status for an eventual return home.  Acute OT will follow to assist with this process of increasing independence at RW level.      Follow Up Recommendations  CIR    Equipment Recommendations  None recommended by OT    Recommendations for Other Services Rehab consult     Precautions / Restrictions Precautions Precautions: Fall Restrictions Weight Bearing Restrictions: No Other Position/Activity Restrictions: complain of R quadrant discomfort - having hiccups.      Mobility Bed Mobility Overal bed mobility: Needs Assistance Bed Mobility: Supine to Sit;Sit to Supine     Supine to sit: Max assist;HOB elevated Sit to supine: Max assist     Patient Response: Cooperative  Transfers Overall transfer level: Needs assistance Equipment used: Rolling walker (2 wheeled) Transfers: Sit to/from Stand Sit to Stand: Max  assist         General transfer comment: patient unable to weight shift or take steps.  Could only stand for 5 seconds before needing to sit.    Balance Overall balance assessment: Needs assistance Sitting-balance support: Feet supported Sitting balance-Leahy Scale: Poor   Postural control: Posterior lean;Right lateral lean Standing balance support: Bilateral upper extremity supported Standing balance-Leahy Scale: Poor Standing balance comment: unable to come to a full stand, or push through RW to assist                           ADL either performed or assessed with clinical judgement   ADL Overall ADL's : Needs assistance/impaired                 Upper Body Dressing : Moderate assistance;Sitting   Lower Body Dressing: Maximal assistance;Sitting/lateral leans               Functional mobility during ADLs: Maximal assistance;+2 for physical assistance       Vision Baseline Vision/History: Wears glasses Wears Glasses: At all times Patient Visual Report: No change from baseline       Perception     Praxis      Pertinent Vitals/Pain Pain Assessment: Faces Faces Pain Scale: Hurts little more Pain Location: abdomen Pain Descriptors / Indicators: Nagging Pain Intervention(s): Monitored during session     Hand Dominance Right   Extremity/Trunk Assessment Upper Extremity Assessment Upper Extremity Assessment: Generalized weakness   Lower Extremity Assessment Lower Extremity Assessment: Generalized  weakness   Cervical / Trunk Assessment Cervical / Trunk Assessment: Kyphotic   Communication Communication Communication: No difficulties   Cognition Arousal/Alertness: Awake/alert Behavior During Therapy: WFL for tasks assessed/performed;Flat affect Overall Cognitive Status: Within Functional Limits for tasks assessed                                                      Home Living Family/patient expects to be  discharged to:: Private residence Living Arrangements: Spouse/significant other Available Help at Discharge: Family;Available 24 hours/day Type of Home: House Home Access: Stairs to enter [employer] of Steps: 4 Entrance Stairs-Rails: Right;Left;Can reach both Home Layout: One level     Bathroom Shower/Tub: Walk-in shower;Door   [employer] Toilet: Standard Bathroom Accessibility: Yes How Accessible: Accessible via walker Home Equipment: Walker - 2 wheels;Bedside commode;Adaptive equipment;Hand held shower head;Grab bars - tub/shower;Grab bars - toilet;Shower seat;Wheelchair - Diplomatic Services operational officer Equipment: Reacher;Long-handled shoe horn        Prior Functioning/Environment Level of Independence: Needs assistance  Gait / Transfers Assistance Needed: Daughters discuss declining mobility over the last few months.  Increasing falls with use of 2WRW ADL's / Homemaking Assistance Needed: SO performs all home mangement, meals, assists with LB ADL and assists with community mobility.            OT Problem List: Decreased strength;Decreased activity tolerance;Impaired balance (sitting and/or standing);Decreased safety awareness      OT Treatment/Interventions: Self-care/ADL training;Therapeutic exercise;DME and/or AE instruction;Balance training;Therapeutic activities    OT Goals(Current goals can be found in the care plan section) Acute Rehab OT Goals Patient Stated Goal: Would like to go home, but needs to be able to walk without help OT Goal Formulation: With patient/family Time For Goal Achievement: 09/05/20 Potential to Achieve Goals: Good ADL Goals Pt Will Perform Grooming: with set-up;sitting Pt Will Perform Upper Body Bathing: with set-up;sitting Pt Will Perform Upper Body Dressing: with set-up;sitting Pt Will Transfer to Toilet: with min assist;stand pivot transfer;bedside commode Pt Will Perform Toileting - Clothing Manipulation and hygiene: with min assist;sit  to/from stand Pt/caregiver will Perform Home Exercise Program: Increased strength;Both right and left upper extremity;With theraband;With Supervision;With written HEP provided  OT Frequency: Min 2X/week   Barriers to D/C:    none noted       Co-evaluation              AM-PAC OT "6 Clicks" Daily Activity     Outcome Measure Help from another person eating meals?: None Help from another person taking care of personal grooming?: None Help from another person toileting, which includes using toliet, bedpan, or urinal?: A Lot Help from another person bathing (including washing, rinsing, drying)?: A Lot Help from another person to put on and taking off regular upper body clothing?: A Lot Help from another person to put on and taking off regular lower body clothing?: A Lot 6 Click Score: 16   End of Session Equipment Utilized During Treatment: Gait belt;Rolling walker Nurse Communication: Mobility status  Activity Tolerance: Patient limited by fatigue Patient left: in bed;with call bell/phone within reach;with bed alarm set;with family/visitor present  OT Visit Diagnosis: Unsteadiness on feet (R26.81);Repeated falls (R29.6);Muscle weakness (generalized) (M62.81)                Time: 7371-0626 OT Time Calculation (min): 18 min Charges:  OT General Charges $  OT Visit: 1 Visit OT Evaluation $OT Eval Moderate Complexity: 1 Mod  08/22/2020  Rich, OTR/L  Acute Rehabilitation Services  Office:  [phone]   Metta Clines 08/22/2020, 3:59 PM

## 2020-08-22 NOTE — ED Notes (Signed)
[facility] requested.

## 2020-08-22 NOTE — ED Notes (Signed)
ED TO INPATIENT HANDOFF REPORT  Name/Age/Gender Charles Randall. 87 y.o. male  Code Status    Code Status Orders  (From admission, onward)         Start     Ordered   08/21/20 2013  Do not attempt resuscitation (DNR)  Continuous       Question Answer Comment  In the event of cardiac or respiratory ARREST Do not call a "code blue"   In the event of cardiac or respiratory ARREST Do not perform Intubation, CPR, defibrillation or ACLS   In the event of cardiac or respiratory ARREST Use medication by any route, position, wound care, and other measures to relive pain and suffering. May use oxygen, suction and manual treatment of airway obstruction as needed for comfort.      08/21/20 2017        Code Status History    Date Active Date Inactive Code Status Order ID Comments User Context   02/12/2018 1710 02/19/2018 2217 DNR [id number]  Louellen Molder, MD ED   02/12/2018 1710 02/12/2018 1710 DNR [id number]  Louellen Molder, MD ED    Advance Directive Documentation   [place] Most Recent Value  Type of Advance Directive Healthcare Power of Attorney  Pre-existing out of facility DNR order (yellow form or pink MOST form) --  "MOST" Form in Place? --      Home/SNF/Other Home  Chief Complaint Hemorrhagic stroke ([place]) [I61.9]  Level of Care/Admitting Diagnosis ED Disposition    ED Disposition  Admit   Condition  --   [place]: [place] [[id number]]  Level of Care: Progressive [102]  Admit to Progressive based on following criteria: NEUROLOGICAL AND NEUROSURGICAL complex patients with significant risk of instability, who do not meet ICU criteria, yet require close observation or frequent assessment (< / = every 2 - 4 hours) with medical / nursing intervention.  May admit patient to [facility] or [facility] if equivalent level of care is available:: No  Covid Evaluation: Asymptomatic Screening Protocol (No Symptoms)  Diagnosis:  Hemorrhagic stroke [facility]) [[id number]]  Admitting Physician: Doreatha Massed  Attending Physician: Etta Quill [phone]  Estimated length of stay: past midnight tomorrow  Certification:: I certify this patient will need inpatient services for at least 2 midnights  Bed request comments: Needs to go to [facility] per Neuro (has brain bleed).         Medical History Past Medical History:  Diagnosis Date  . Anemia    STARTED PILLS 1 MONTH AGO NEW DX  . Aortic insufficiency    mild, moderate  . Arthritis    FINGERS SWELL PT THINKS ARTHITIS, TOP OF WRIST ALSO SWELLS ONCE IN A WHILE  . Bleeds easily ([place])    BRUISE EASY ALSO  . Chronic kidney disease   . Cough    LAST 3 TO 4 WEEKS PHLEGM YELLOW INTERMITTENT COUGH NO FEVER  . Dyslipidemia   . H/O dizziness    OCC   . H/O Doppler ultrasound 2010  . H/O echocardiogram 2013   aortic valve disorder, EF =>55%  . H/O echocardiogram 2012   AVD, EF =>55%  . Heart murmur   . History of hiatal hernia   . HTN (hypertension)   . Inguinal hernia   . Mitral regurgitation    mild  . Pneumonia FEW YEARS AGO   X 1  . Post-polio syndrome age 48  . Seizures ([place])    last sz  2016 NO CAUSE FOUND, 3 seizures on 03/25/18   . Swelling of both lower extremities    DOES NOT ALWAYS DO DOWN   . Tricuspid regurgitation    mild    Allergies No Known Allergies  IV Location/Drains/Wounds Patient Lines/Drains/Airways Status    Active Line/Drains/Airways    Name Placement date Placement time Site Days   Peripheral IV 08/21/20 20 G Left Antecubital 08/21/20  1910  Antecubital  1          Labs/Imaging Results for orders placed or performed during the hospital encounter of 08/21/20 (from the past 48 hour(s))  Basic metabolic panel     Status: Abnormal   Collection Time: 08/21/20  4:30 PM  Result Value Ref Range   Sodium 133 (L) 135 - 145 mmol/L   Potassium 3.9 3.5 - 5.1 mmol/L   Chloride 97 (L) 98 - 111 mmol/L   CO2 28 22 - 32 mmol/L    Glucose, Bld 158 (H) 70 - 99 mg/dL    Comment: Glucose reference range applies only to samples taken after fasting for at least 8 hours.   BUN 36 (H) 8 - 23 mg/dL   Creatinine, Ser 1.41 (H) 0.61 - 1.24 mg/dL   Calcium 9.1 8.9 - 10.3 mg/dL   GFR, Estimated 47 (L) >60 mL/min    Comment: (NOTE) Calculated using the CKD-EPI Creatinine Equation (2021)    Anion gap 8 5 - 15    Comment: Performed at [facility], [place] [address] [place], [place] [address]  CBC     Status: Abnormal   Collection Time: 08/21/20  4:30 PM  Result Value Ref Range   WBC 9.2 4.0 - 10.5 K/uL   RBC 3.60 (L) 4.22 - 5.81 MIL/uL   Hemoglobin 11.0 (L) 13.0 - 17.0 g/dL   HCT 33.2 (L) 39.0 - 52.0 %   MCV 92.2 80.0 - 100.0 fL   MCH 30.6 26.0 - 34.0 pg   MCHC 33.1 30.0 - 36.0 g/dL   RDW 13.5 11.5 - 15.5 %   Platelets 350 150 - 400 K/uL   nRBC 0.0 0.0 - 0.2 %    Comment: Performed at [facility], [place] [address], [place], [place] [address]  Urinalysis, Routine w reflex microscopic     Status: Abnormal   Collection Time: 08/21/20  4:30 PM  Result Value Ref Range   Color, Urine YELLOW YELLOW   APPearance HAZY (A) CLEAR   Specific Gravity, Urine 1.018 1.005 - 1.030   pH 5.0 5.0 - 8.0   Glucose, UA NEGATIVE NEGATIVE mg/dL   Hgb urine dipstick NEGATIVE NEGATIVE   Bilirubin Urine NEGATIVE NEGATIVE   Ketones, ur NEGATIVE NEGATIVE mg/dL   Protein, ur 30 (A) NEGATIVE mg/dL   Nitrite NEGATIVE NEGATIVE   Leukocytes,Ua NEGATIVE NEGATIVE   RBC / HPF 0-5 0 - 5 RBC/hpf   WBC, UA 0-5 0 - 5 WBC/hpf   Bacteria, UA NONE SEEN NONE SEEN   Squamous Epithelial / LPF 0-5 0 - 5    Comment: Performed at [facility], [place] [address], [place], [place] [address]  CBG monitoring, ED     Status: Abnormal   Collection Time: 08/21/20  4:37 PM  Result Value Ref Range   Glucose-Capillary 163 (H) 70 - 99 mg/dL    Comment: Glucose reference range applies only to samples taken after fasting  for at least 8 hours.  Hepatic function panel     Status: Abnormal  Collection Time: 08/21/20  4:45 PM  Result Value Ref Range   Total Protein 6.5 6.5 - 8.1 g/dL   Albumin 2.9 (L) 3.5 - 5.0 g/dL   AST 22 15 - 41 U/L   ALT 18 0 - 44 U/L   Alkaline Phosphatase 75 38 - 126 U/L   Total Bilirubin 0.4 0.3 - 1.2 mg/dL   Bilirubin, Direct 0.1 0.0 - 0.2 mg/dL   Indirect Bilirubin 0.3 0.3 - 0.9 mg/dL    Comment: Performed at [facility], [place] [address] [place], [place] [address]   DG Chest 2 View  Result Date: 08/21/2020 CLINICAL DATA:  Chest pain. Multiple falls this week. EXAM: CHEST - 2 VIEW COMPARISON:  CTA chest, abdomen, and pelvis dated Jul 19, 2020. FINDINGS: The heart size and mediastinal contours are within normal limits. Normal pulmonary vascularity. Small right greather than left pleural effusions with associated right greater than left lower lobe atelectasis. No pneumothorax. No acute osseous abnormality. IMPRESSION: Right greater than left pleural effusions and bibasilar atelectasis. Electronically Signed   By: Titus Dubin M.D.   On: 08/21/2020 19:53   CT Head Wo Contrast  Result Date: 08/21/2020 CLINICAL DATA:  Multiple fall EXAM: CT HEAD WITHOUT CONTRAST TECHNIQUE: Contiguous axial images were obtained from the base of the skull through the vertex without intravenous contrast. COMPARISON:  CT brain 07/19/2020, MRI 12/08/2019 FINDINGS: Brain: Negative for intracranial mass. Moderate atrophy and chronic small vessel ischemic change of the white matter. Hypodensity in the right basal ganglia with faint internal hyperdensity, contiguous with mild hyperdensity adherent to the lateral wall of the right anterior ventricle, series 2, image 22. Stable ventricle size. Vascular: No hyperdense vessel.  Carotid vascular calcification Skull: Normal. Negative for fracture or focal lesion. Sinuses/Orbits: No acute finding. Other: None IMPRESSION: 1. Increased hypodensity within  the right basal ganglia which appears to be associated with faint hyperdensity, suggestive of subacute hemorrhage or infarct. There appears to be a small amount of thrombus adherent to the lateral wall of the right anterior ventricle. No mass effect. No midline shift. 2. Atrophy and chronic small vessel ischemic changes of the white matter Critical Value/emergent results were called by telephone at the time of interpretation on 08/21/2020 at 6:26 pm to provider RACHEL LITTLE , who verbally acknowledged these results. Electronically Signed   By: Donavan Foil M.D.   On: 08/21/2020 18:26   MR BRAIN W WO CONTRAST  Result Date: 08/21/2020 CLINICAL DATA:  Initial evaluation for acute stroke. EXAM: MRI HEAD WITHOUT AND WITH CONTRAST TECHNIQUE: Multiplanar, multiecho pulse sequences of the brain and surrounding structures were obtained without and with intravenous contrast. CONTRAST:  6.74mL GADAVIST GADOBUTROL 1 MMOL/ML IV SOLN COMPARISON:  Prior CT from earlier the same day as well as earlier studies. FINDINGS: Brain: Diffuse prominence of the CSF containing spaces compatible generalized cerebral atrophy. Patchy T2/FLAIR hyperintensity within the periventricular and deep white matter both cerebral hemispheres most consistent with chronic small vessel ischemic disease, moderate in nature. Abnormal T2/FLAIR signal abnormality seen involving the right basal ganglia, with involvement of the right caudate and lentiform nuclei. Associated irregular postcontrast enhancement. Scattered areas of internal susceptibility artifact present as well. Overall appearance is felt to be most consistent with an evolving subacute hemorrhagic ischemic infarct. 9 mm soft tissue density with associated susceptibility artifact positioned along the wall of the right lateral ventricle likely reflects evolving hemorrhage, also seen on prior CT (series 17, image 88). No significant regional mass effect. Additional subtle  hazy diffusion abnormality  with mild enhancement noted involving the periventricular white matter adjacent to the frontal horn of the left lateral ventricle (series 5, image 73). Finding also suspicious for evolving subacute small vessel ischemia. No other diffusion abnormality to suggest acute or subacute ischemia. Gray-white matter differentiation otherwise maintained. No other evidence for acute or chronic intracranial hemorrhage. No mass lesion, mass effect, or midline shift. Mild ventricular prominence related to global parenchymal volume loss without hydrocephalus. No extra-axial fluid collection. Pituitary gland suprasellar region within normal limits. Midline structures intact. No other abnormal enhancement. Vascular: Major intracranial vascular flow voids are maintained. Skull and upper cervical spine: Craniocervical junction within normal limits. Bone marrow signal intensity normal. Small lipoma noted at the left frontal scalp. Sinuses/Orbits: Globes and orbital soft tissues demonstrate no acute finding. Patient status post bilateral ocular lens replacement. Scattered mucosal thickening noted within the ethmoidal air cells as well as the left sphenoid and maxillary sinuses. No mastoid effusion. Inner ear structures grossly normal. Other: None. IMPRESSION: 1. Findings most consistent with an evolving subacute hemorrhagic right basal ganglia infarct as above. 9 mm soft tissue density along the lateral wall of the right lateral ventricle consistent with associated hemorrhage, corresponding with abnormality on prior CT. While these findings are favored to be vascular in ischemic in nature, a short interval follow-up MRI to ensure these changes resolve is recommended. 2. Additional subtle hazy diffusion abnormality involving the periventricular white matter adjacent to the frontal horn of the left lateral ventricle, also suspicious for evolving subacute small vessel ischemia. 3. Underlying age-related cerebral atrophy with chronic  microvascular ischemic disease. Electronically Signed   By: Jeannine Boga M.D.   On: 08/21/2020 22:40    Pending Labs Unresulted Labs (From admission, onward)    Start     Ordered   08/21/20 1831  SARS CORONAVIRUS 2 (TAT 6-24 HRS) Nasopharyngeal Nasopharyngeal Swab  (Tier 3 - Symptomatic/asymptomatic)  Once,   STAT       Question Answer Comment  Is this test for diagnosis or screening Screening   Symptomatic for COVID-19 as defined by CDC No   Hospitalized for COVID-19 No   Admitted to ICU for COVID-19 No   Previously tested for COVID-19 Yes   Resident in a congregate (group) care setting Unknown   Employed in healthcare setting Unknown   Has patient completed COVID vaccination(s) (2 doses of Pfizer/Moderna 1 dose of [employer]) Unknown      08/21/20 1830          Vitals/Pain Today's Vitals   08/21/20 2000 08/22/20 0000 08/22/20 0200 08/22/20 0300  BP: 124/68 125/62 107/71 108/82  Pulse: 85 73 73 76  Resp: 14 14 (!) 23 16  Temp:      TempSrc:      SpO2: 94% 94% 94% 94%  PainSc:        Isolation Precautions No active isolations  Medications Medications   stroke: mapping our early stages of recovery book (has no administration in time range)  acetaminophen (TYLENOL) tablet 650 mg (has no administration in time range)    Or  acetaminophen (TYLENOL) 160 MG/5ML solution 650 mg (has no administration in time range)    Or  acetaminophen (TYLENOL) suppository 650 mg (has no administration in time range)  senna-docusate (Senokot-S) tablet 1 tablet (1 tablet Oral Given 08/21/20 2235)  pantoprazole (PROTONIX) injection 40 mg (40 mg Intravenous Given 08/21/20 2235)  tacrolimus (PROTOPIC) 0.1 % ointment (has no administration in time range)  lisinopril-hydrochlorothiazide (ZESTORETIC) 20-25 MG per tablet 1 tablet (has no administration in time range)  hydrochlorothiazide (HYDRODIURIL) tablet 6.25 mg (has no administration in time range)  carbamazepine (TEGRETOL XR)  12 hr tablet 100 mg (100 mg Oral Given 08/21/20 2316)  ezetimibe (ZETIA) tablet 10 mg (has no administration in time range)  levETIRAcetam (KEPPRA) tablet 1,500 mg (1,500 mg Oral Given 08/21/20 2315)  gadobutrol (GADAVIST) 1 MMOL/ML injection 6.5 mL (6.5 mLs Intravenous Contrast Given 08/21/20 2121)    Mobility walks with device

## 2020-08-22 NOTE — ED Notes (Signed)
Patient is resting comfortably. 

## 2020-08-22 NOTE — Progress Notes (Signed)
Initial Nutrition Assessment  DOCUMENTATION CODES:   Not applicable  INTERVENTION:  Please obtain updated weight  -Ensure Enlive po BID, each supplement provides 350 kcal and 20 grams of protein -MVI with minerals daily -Recommend liberalizing diet to regular given pt's advanced age  NUTRITION DIAGNOSIS:   Predicted suboptimal nutrient intake related to decreased appetite as evidenced by other (comment) (per MST report).    GOAL:   Patient will meet greater than or equal to 90% of their needs    MONITOR:   PO intake, Supplement acceptance, Weight trends, Labs, I & O's  REASON FOR ASSESSMENT:   Malnutrition Screening Tool    ASSESSMENT:   Pt with a PMH significant for CKD stage 3, seizure disorder, HTN, and chronic L sided weakness 2/2 post-polio syndrome admitted with hemorrhagic stroke.  Pt unavailable at time of RD visit.   Weight not updated for this admission. No weight taken since 07/19/20 (pt weighed 63.5 kg). Will use this to estimate pt's needs. Recommend obtaining new measured weight so weight history can be assessed.   No PO intake documented.   No UOP documented.   Medications: protonix, senokot-s Labs: Na 133 (L), Cr 1.41 (H)  Diet Order:   Diet Order             Diet Heart Room service appropriate? Yes; Fluid consistency: Thin  Diet effective now                   EDUCATION NEEDS:   No education needs have been identified at this time  Skin:  Skin Assessment: Reviewed RN Assessment  Last BM:  PTA  Height:   Ht Readings from Last 1 Encounters:  07/19/20 5\' 6"  (1.676 m)    Weight:   Wt Readings from Last 1 Encounters:  07/19/20 63.5 kg    BMI:  There is no height or weight on file to calculate BMI.  Estimated Nutritional Needs:   Kcal:  1600-1800  Protein:  80-90g  Fluid:  >1.6L/d    Larkin Ina, MS, RD, LDN (she/her/hers) RD pager number and weekend/on-call pager number located in [place].

## 2020-08-22 NOTE — ED Notes (Signed)
Patient is resting comfortably. VSS °

## 2020-08-22 NOTE — ED Notes (Signed)
Inpatient RN unable to receive report at this time due to completing pt care. Will return call to this RN at earliest availability.

## 2020-08-22 NOTE — Progress Notes (Signed)
PT Cancellation Note  Patient Details Name: Charles Randall. MRN: [id number] DOB: [birth year]   Cancelled Treatment:    Reason Eval/Treat Not Completed: Other (comment)Patient to transfer to [facility].   Claretha Cooper 08/22/2020, 6:53 AM  [place] Pager [phone] Office [phone]

## 2020-08-22 NOTE — Plan of Care (Signed)
  Problem: Education: Goal: Knowledge of General Education information will improve Description Including pain rating scale, medication(s)/side effects and non-pharmacologic comfort measures Outcome: Progressing   

## 2020-08-22 NOTE — Progress Notes (Signed)
  Echocardiogram 2D Echocardiogram has been performed.  Charles Randall 08/22/2020, 11:15 AM

## 2020-08-23 DIAGNOSIS — I6389 Other cerebral infarction: Principal | ICD-10-CM

## 2020-08-23 LAB — LIPID PANEL
Cholesterol: 131 mg/dL (ref 0–200)
HDL: 38 mg/dL — ABNORMAL LOW (ref >40)
LDL Cholesterol: 77 mg/dL (ref 0–99)
Total CHOL/HDL Ratio: 3.4 RATIO
Triglycerides: 80 mg/dL (ref <150)
VLDL: 16 mg/dL (ref 0–40)

## 2020-08-23 LAB — BASIC METABOLIC PANEL
Anion gap: 10 (ref 5–15)
BUN: 31 mg/dL — ABNORMAL HIGH (ref 8–23)
CO2: 26 mmol/L (ref 22–32)
Calcium: 8.9 mg/dL (ref 8.9–10.3)
Chloride: 97 mmol/L — ABNORMAL LOW (ref 98–111)
Creatinine, Ser: 1.47 mg/dL — ABNORMAL HIGH (ref 0.61–1.24)
GFR, Estimated: 45 mL/min — ABNORMAL LOW (ref >60)
Glucose, Bld: 103 mg/dL — ABNORMAL HIGH (ref 70–99)
Potassium: 3.4 mmol/L — ABNORMAL LOW (ref 3.5–5.1)
Sodium: 133 mmol/L — ABNORMAL LOW (ref 135–145)

## 2020-08-23 LAB — CBC
HCT: 31.3 % — ABNORMAL LOW (ref 39.0–52.0)
Hemoglobin: 10.4 g/dL — ABNORMAL LOW (ref 13.0–17.0)
MCH: 30.1 pg (ref 26.0–34.0)
MCHC: 33.2 g/dL (ref 30.0–36.0)
MCV: 90.5 fL (ref 80.0–100.0)
Platelets: 347 10*3/uL (ref 150–400)
RBC: 3.46 MIL/uL — ABNORMAL LOW (ref 4.22–5.81)
RDW: 13.4 % (ref 11.5–15.5)
WBC: 8.2 10*3/uL (ref 4.0–10.5)
nRBC: 0 % (ref 0.0–0.2)

## 2020-08-23 MED ORDER — PANTOPRAZOLE SODIUM 40 MG PO TBEC
40.0000 mg | DELAYED_RELEASE_TABLET | Freq: Every day | ORAL | Status: DC
  Administered 2020-08-23 – 2020-08-26 (×4): 40 mg via ORAL
  Filled 2020-08-23 (×3): qty 1

## 2020-08-23 MED ORDER — POTASSIUM CHLORIDE CRYS ER 20 MEQ PO TBCR
40.0000 meq | EXTENDED_RELEASE_TABLET | Freq: Once | ORAL | Status: AC
  Administered 2020-08-23: 40 meq via ORAL
  Filled 2020-08-23: qty 2

## 2020-08-23 MED ORDER — HEPARIN SODIUM (PORCINE) 5000 UNIT/ML IJ SOLN
5000.0000 [IU] | Freq: Three times a day (TID) | INTRAMUSCULAR | Status: DC
  Administered 2020-08-23 – 2020-08-26 (×7): 5000 [IU] via SUBCUTANEOUS
  Filled 2020-08-23 (×7): qty 1

## 2020-08-23 MED ORDER — ASPIRIN EC 81 MG PO TBEC
81.0000 mg | DELAYED_RELEASE_TABLET | Freq: Every day | ORAL | Status: DC
  Administered 2020-08-23 – 2020-08-26 (×4): 81 mg via ORAL
  Filled 2020-08-23 (×4): qty 1

## 2020-08-23 NOTE — Progress Notes (Signed)
PROGRESS NOTE    Charles Randall.  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  CT scan of the head revealed subacute right basal ganglia hemorrhagic infarct.  Patient was admitted to the hospital for further work-up.  Seen by neurology, started on aspirin since stroke was subacute.  Other work-up was unrevealing.  Seen by PT/OT with recommendations for CIR.  Patient is otherwise stable for discharge once discharge disposition can be determined.   Assessment & Plan:   Principal Problem:   Hemorrhagic stroke [facility]) Active Problems:   Benign essential hypertension   Seizure disorder (HCC)   CKD (chronic kidney disease), stage III (HCC)   Subacute hemorrhagic infarct of right basal ganglia Ischemic subacute infarct of the frontal horn of left lateral ventricle -Seen by neurology -Started on aspirin -LDL 77, per neuro, no statin due to advanced age and LDL near goal.  Continue on Zetia -A1c 5.5 -2D echocardiogram with normal EF, without any signs of embolus -Blood pressure goal less than 160 -Seen by PT/OT with recommendations for CIR -Await further input from rehab MD  Seizure disorder -Continue on Keppra and carbamazepine  Chronic kidney disease stage IIIa -Creatinine is currently at baseline -Continue to monitor  Hypertension -Chronically on lisinopril/hydrochlorothiazide -Blood pressure stable -Can resume blood pressure medications if blood pressure starts to trend up  Left kidney mass -Noted on CT scan from 06/2020 -Can consider nonemergent MRI abdomen if family desires further work-up  Thoracic aortic aneurysm -Noted on CT scan from 06/2020, measured at 4.7 cm -Can consider further surveillance as an  outpatient, although I would not anticipate that he be a candidate for any surgical intervention due to his advanced age  Intractable hiccups -Patient reports history of intractable hiccups for the past 2 to 3 weeks -Continue on Protonix -Continue on Thorazine as needed  DVT prophylaxis: heparin injection 5,000 Units Start: 08/23/20 2200 SCD's Start: 08/21/20 2012  Code Status: DNR Family Communication: Updated patient's daughter Tammy Elders, who is patient's POA Disposition Plan: Status is: Inpatient  Remains inpatient appropriate because:Ongoing diagnostic testing needed not appropriate for outpatient work up and Unsafe d/c plan  Dispo: The patient is from: Home              Anticipated d/c is to: CIR versus SNF              Patient currently is medically stable to d/c.   Difficult to place patient No         Consultants:  Neurology  Procedures:  Echocardiogram  Antimicrobials:      Subjective: Reports that hiccups were bothering him today.  He did receive some Thorazine.  Sleeping at the time my arrival.  No other complaints.  Objective: Vitals:   08/22/20 2100 08/23/20 0012 08/23/20 0407 08/23/20 1339  BP:  105/65  106/65  Pulse:    (!) 108  Resp:  20 16 20   Temp: 97.8 F (36.6 C) 98.2 F (36.8 C) 97.9 F (36.6 C)   TempSrc: Axillary Oral Oral   SpO2:    92%   No intake or output data in the 24 hours ending 08/23/20 1908 There were no vitals filed for this visit.  Examination: General exam: Alert, awake, oriented x 3 Respiratory system: Clear  to auscultation. Respiratory effort normal. Cardiovascular system:RRR. No murmurs, rubs, gallops. Gastrointestinal system: Abdomen is nondistended, soft and nontender. No organomegaly or masses felt. Normal bowel sounds heard. Central nervous system: Alert and oriented.  Right upper and lower extremity 4 out of 5, left lower extremity 2 out of 5 related to foot drop Extremities: No C/C/E, +pedal pulses Skin:  No rashes, lesions or ulcers Psychiatry: Judgement and insight appear normal. Mood & affect appropriate.      Data Reviewed: I have personally reviewed following labs and imaging studies  CBC: Recent Labs  Lab 08/21/20 1630 08/23/20 0207  WBC 9.2 8.2  HGB 11.0* 10.4*  HCT 33.2* 31.3*  MCV 92.2 90.5  PLT 350 940   Basic Metabolic Panel: Recent Labs  Lab 08/21/20 1630 08/23/20 0207  NA 133* 133*  K 3.9 3.4*  CL 97* 97*  CO2 28 26  GLUCOSE 158* 103*  BUN 36* 31*  CREATININE 1.41* 1.47*  CALCIUM 9.1 8.9   GFR: CrCl cannot be calculated (Unknown ideal weight.). Liver Function Tests: Recent Labs  Lab 08/21/20 1645  AST 22  ALT 18  ALKPHOS 75  BILITOT 0.4  PROT 6.5  ALBUMIN 2.9*   No results for input(s): LIPASE, AMYLASE in the last 168 hours. No results for input(s): AMMONIA in the last 168 hours. Coagulation Profile: No results for input(s): INR, PROTIME in the last 168 hours. Cardiac Enzymes: No results for input(s): CKTOTAL, CKMB, CKMBINDEX, TROPONINI in the last 168 hours. BNP (last 3 results) No results for input(s): PROBNP in the last 8760 hours. HbA1C: Recent Labs    08/22/20 1458  HGBA1C 5.5   CBG: Recent Labs  Lab 08/21/20 1637  GLUCAP 163*   Lipid Profile: Recent Labs    08/23/20 0207  CHOL 131  HDL 38*  LDLCALC 77  TRIG 80  CHOLHDL 3.4   Thyroid Function Tests: Recent Labs    08/22/20 1458  TSH 4.153   Anemia Panel: Recent Labs    08/22/20 1458  VITAMINB12 1,518*   Sepsis Labs: No results for input(s): PROCALCITON, LATICACIDVEN in the last 168 hours.  Recent Results (from the past 240 hour(s))  SARS CORONAVIRUS 2 (TAT 6-24 HRS) Nasopharyngeal Nasopharyngeal Swab     Status: None   Collection Time: 08/21/20  6:40 PM   Specimen: Nasopharyngeal Swab  Result Value Ref Range Status   SARS Coronavirus 2 NEGATIVE NEGATIVE Final    Comment: (NOTE) SARS-CoV-2 target nucleic acids are NOT DETECTED.  The SARS-CoV-2 RNA is  generally detectable in upper and lower respiratory specimens during the acute phase of infection. Negative results do not preclude SARS-CoV-2 infection, do not rule out co-infections with other pathogens, and should not be used as the sole basis for treatment or other patient management decisions. Negative results must be combined with clinical observations, patient history, and epidemiological information. The expected result is Negative.  Fact Sheet for Patients: SugarRoll.be  Fact Sheet for Healthcare Providers: https://www.woods-mathews.com/  This test is not yet approved or cleared by the [place] FDA and  has been authorized for detection and/or diagnosis of SARS-CoV-2 by FDA under an Emergency Use Authorization (EUA). This EUA will remain  in effect (meaning this test can be used) for the duration of the COVID-19 declaration under Se ction 564(b)(1) of the Act, 21 U.S.C. section 360bbb-3(b)(1), unless the authorization is terminated or revoked sooner.  Performed at [facility], [place] [address], [place], [place] [address]  Radiology Studies: CT ANGIO HEAD W OR WO CONTRAST  Result Date: 08/22/2020 CLINICAL DATA:  Stroke/TIA. EXAM: CT ANGIOGRAPHY HEAD AND NECK TECHNIQUE: Multidetector CT imaging of the head and neck was performed using the standard protocol during bolus administration of intravenous contrast. Multiplanar CT image reconstructions and MIPs were obtained to evaluate the vascular anatomy. Carotid stenosis measurements (when applicable) are obtained utilizing NASCET criteria, using the distal internal carotid diameter as the denominator. CONTRAST:  141mL OMNIPAQUE IOHEXOL 350 MG/ML SOLN COMPARISON:  MRI and CT head August 21, 2020.  CTA December 08, 2019. FINDINGS: CT HEAD FINDINGS Brain: Evolving central hypodensity with peripheral faint hyperdensity in the right basal ganglia. Suspected infarct along the  frontal horn left lateral ventricle was better characterized on prior MRI. Similar adjacent rounded area of hyperdensity along the margin of the right lateral ventricle, likely hemorrhage. No progressive mass effect. Additional moderate patchy white matter hypoattenuation, most likely related to chronic microvascular ischemic disease. No evidence of interval acute large vascular territory infarct. Moderate atrophy with ex vacuo ventricular dilation. No hydrocephalus. No extra-axial fluid collection. Vascular: See below. Skull: No acute fracture. Sinuses: Mucosal thickening of the posteromedial left sphenoid sinus and atretic left maxillary sinus. No acute orbital findings. Other: No mastoid effusions. Review of the MIP images confirms the above findings CTA NECK FINDINGS Aortic arch: Right carotid system: No evidence of dissection, stenosis (50% or greater) or occlusion. Predominantly calcific atherosclerosis at the carotid bifurcation without greater than 50% stenosis. Left carotid system: The left common carotid origin is not imaged. No visible significant (greater than 50%) stenosis of the common carotid artery or internal carotid artery with calcific atherosclerosis at the carotid bifurcation. Mild narrowing of the ICA at the skull base, similar to prior. Vertebral arteries: Severe stenosis of bilateral vertebral arteries at their origins. Otherwise, vertebral arteries are patent. Mildly left dominant. Skeleton: Moderate degenerative disease at C5-C6 and C6-C7. REmote compression fracture in the incidentally imaged thoracic spine, similar to prior. Other neck: No acute abnormality. Similar 13 mm hypodense nodule within the thyroid gland which does not require further imaging follow-up per current guidelines. Upper chest: Biapical pleuroparenchymal scarring. Otherwise, visualized lung apices are clear. Review of the MIP images confirms the above findings CTA HEAD FINDINGS Anterior circulation: No large vessel  occlusion or proximal hemodynamically significant stenosis. Predominately calcific bilateral ICA atherosclerosis without evidence of greater than 50% narrowing. No aneurysm identified. No evidence of a vascular malformation in the region of the right basal ganglia hemorrhage. Posterior circulation: Bilateral intradural vertebral arteries, basilar artery and PCAs are patent without evidence of hemodynamically significant stenosis. Right PCA is supplied by a small right P1 PCA and right posterior communicating artery, similar to prior. No aneurysm identified. Venous sinuses: As permitted by contrast timing, patent. Review of the MIP images confirms the above findings IMPRESSION: CT head: 1. Similar findings of suspected evolving hemorrhagic infarct in the right basal ganglia, better characterized on recent MRI. No progressive mass effect or evidence of new hemorrhage. 2. Moderate chronic microvascular ischemic disease and atrophy. CTA Head: 1. No large vessel occlusion or proximal hemodynamically significant stenosis intracranially. 2. No evidence of a vascular malformation in the region of the right basal ganglia hemorrhage. CTA Neck: 1. Similar severe bilateral vertebral artery origin stenosis. 2. Bilateral carotid bifurcation atherosclerosis without greater than 50% narrowing. Electronically Signed   By: Margaretha Sheffield MD   On: 08/22/2020 17:57   DG Chest 2 View  Result Date: 08/21/2020 CLINICAL DATA:  Chest  pain. Multiple falls this week. EXAM: CHEST - 2 VIEW COMPARISON:  CTA chest, abdomen, and pelvis dated Jul 19, 2020. FINDINGS: The heart size and mediastinal contours are within normal limits. Normal pulmonary vascularity. Small right greather than left pleural effusions with associated right greater than left lower lobe atelectasis. No pneumothorax. No acute osseous abnormality. IMPRESSION: Right greater than left pleural effusions and bibasilar atelectasis. Electronically Signed   By: Titus Dubin  M.D.   On: 08/21/2020 19:53   CT ANGIO NECK W OR WO CONTRAST  Result Date: 08/22/2020 CLINICAL DATA:  Stroke/TIA. EXAM: CT ANGIOGRAPHY HEAD AND NECK TECHNIQUE: Multidetector CT imaging of the head and neck was performed using the standard protocol during bolus administration of intravenous contrast. Multiplanar CT image reconstructions and MIPs were obtained to evaluate the vascular anatomy. Carotid stenosis measurements (when applicable) are obtained utilizing NASCET criteria, using the distal internal carotid diameter as the denominator. CONTRAST:  138mL OMNIPAQUE IOHEXOL 350 MG/ML SOLN COMPARISON:  MRI and CT head August 21, 2020.  CTA December 08, 2019. FINDINGS: CT HEAD FINDINGS Brain: Evolving central hypodensity with peripheral faint hyperdensity in the right basal ganglia. Suspected infarct along the frontal horn left lateral ventricle was better characterized on prior MRI. Similar adjacent rounded area of hyperdensity along the margin of the right lateral ventricle, likely hemorrhage. No progressive mass effect. Additional moderate patchy white matter hypoattenuation, most likely related to chronic microvascular ischemic disease. No evidence of interval acute large vascular territory infarct. Moderate atrophy with ex vacuo ventricular dilation. No hydrocephalus. No extra-axial fluid collection. Vascular: See below. Skull: No acute fracture. Sinuses: Mucosal thickening of the posteromedial left sphenoid sinus and atretic left maxillary sinus. No acute orbital findings. Other: No mastoid effusions. Review of the MIP images confirms the above findings CTA NECK FINDINGS Aortic arch: Right carotid system: No evidence of dissection, stenosis (50% or greater) or occlusion. Predominantly calcific atherosclerosis at the carotid bifurcation without greater than 50% stenosis. Left carotid system: The left common carotid origin is not imaged. No visible significant (greater than 50%) stenosis of the common carotid  artery or internal carotid artery with calcific atherosclerosis at the carotid bifurcation. Mild narrowing of the ICA at the skull base, similar to prior. Vertebral arteries: Severe stenosis of bilateral vertebral arteries at their origins. Otherwise, vertebral arteries are patent. Mildly left dominant. Skeleton: Moderate degenerative disease at C5-C6 and C6-C7. REmote compression fracture in the incidentally imaged thoracic spine, similar to prior. Other neck: No acute abnormality. Similar 13 mm hypodense nodule within the thyroid gland which does not require further imaging follow-up per current guidelines. Upper chest: Biapical pleuroparenchymal scarring. Otherwise, visualized lung apices are clear. Review of the MIP images confirms the above findings CTA HEAD FINDINGS Anterior circulation: No large vessel occlusion or proximal hemodynamically significant stenosis. Predominately calcific bilateral ICA atherosclerosis without evidence of greater than 50% narrowing. No aneurysm identified. No evidence of a vascular malformation in the region of the right basal ganglia hemorrhage. Posterior circulation: Bilateral intradural vertebral arteries, basilar artery and PCAs are patent without evidence of hemodynamically significant stenosis. Right PCA is supplied by a small right P1 PCA and right posterior communicating artery, similar to prior. No aneurysm identified. Venous sinuses: As permitted by contrast timing, patent. Review of the MIP images confirms the above findings IMPRESSION: CT head: 1. Similar findings of suspected evolving hemorrhagic infarct in the right basal ganglia, better characterized on recent MRI. No progressive mass effect or evidence of new hemorrhage. 2. Moderate chronic microvascular  ischemic disease and atrophy. CTA Head: 1. No large vessel occlusion or proximal hemodynamically significant stenosis intracranially. 2. No evidence of a vascular malformation in the region of the right basal  ganglia hemorrhage. CTA Neck: 1. Similar severe bilateral vertebral artery origin stenosis. 2. Bilateral carotid bifurcation atherosclerosis without greater than 50% narrowing. Electronically Signed   By: Margaretha Sheffield MD   On: 08/22/2020 17:57   MR BRAIN W WO CONTRAST  Result Date: 08/21/2020 CLINICAL DATA:  Initial evaluation for acute stroke. EXAM: MRI HEAD WITHOUT AND WITH CONTRAST TECHNIQUE: Multiplanar, multiecho pulse sequences of the brain and surrounding structures were obtained without and with intravenous contrast. CONTRAST:  6.53mL GADAVIST GADOBUTROL 1 MMOL/ML IV SOLN COMPARISON:  Prior CT from earlier the same day as well as earlier studies. FINDINGS: Brain: Diffuse prominence of the CSF containing spaces compatible generalized cerebral atrophy. Patchy T2/FLAIR hyperintensity within the periventricular and deep white matter both cerebral hemispheres most consistent with chronic small vessel ischemic disease, moderate in nature. Abnormal T2/FLAIR signal abnormality seen involving the right basal ganglia, with involvement of the right caudate and lentiform nuclei. Associated irregular postcontrast enhancement. Scattered areas of internal susceptibility artifact present as well. Overall appearance is felt to be most consistent with an evolving subacute hemorrhagic ischemic infarct. 9 mm soft tissue density with associated susceptibility artifact positioned along the wall of the right lateral ventricle likely reflects evolving hemorrhage, also seen on prior CT (series 17, image 88). No significant regional mass effect. Additional subtle hazy diffusion abnormality with mild enhancement noted involving the periventricular white matter adjacent to the frontal horn of the left lateral ventricle (series 5, image 73). Finding also suspicious for evolving subacute small vessel ischemia. No other diffusion abnormality to suggest acute or subacute ischemia. Gray-white matter differentiation otherwise  maintained. No other evidence for acute or chronic intracranial hemorrhage. No mass lesion, mass effect, or midline shift. Mild ventricular prominence related to global parenchymal volume loss without hydrocephalus. No extra-axial fluid collection. Pituitary gland suprasellar region within normal limits. Midline structures intact. No other abnormal enhancement. Vascular: Major intracranial vascular flow voids are maintained. Skull and upper cervical spine: Craniocervical junction within normal limits. Bone marrow signal intensity normal. Small lipoma noted at the left frontal scalp. Sinuses/Orbits: Globes and orbital soft tissues demonstrate no acute finding. Patient status post bilateral ocular lens replacement. Scattered mucosal thickening noted within the ethmoidal air cells as well as the left sphenoid and maxillary sinuses. No mastoid effusion. Inner ear structures grossly normal. Other: None. IMPRESSION: 1. Findings most consistent with an evolving subacute hemorrhagic right basal ganglia infarct as above. 9 mm soft tissue density along the lateral wall of the right lateral ventricle consistent with associated hemorrhage, corresponding with abnormality on prior CT. While these findings are favored to be vascular in ischemic in nature, a short interval follow-up MRI to ensure these changes resolve is recommended. 2. Additional subtle hazy diffusion abnormality involving the periventricular white matter adjacent to the frontal horn of the left lateral ventricle, also suspicious for evolving subacute small vessel ischemia. 3. Underlying age-related cerebral atrophy with chronic microvascular ischemic disease. Electronically Signed   By: Jeannine Boga M.D.   On: 08/21/2020 22:40   ECHOCARDIOGRAM COMPLETE  Result Date: 08/22/2020    ECHOCARDIOGRAM REPORT   Patient Name:   Charles Randall. Date of Exam: 08/22/2020 Medical Rec #:  [id number]         Height:       66.0 in Accession #:  [id number]         Weight:       140.0 lb Date of Birth:  1927-09-04          BSA:          1.719 m Patient Age:    60 years          BP:           120/83 mmHg Patient Gender: M                 HR:           84 bpm. Exam Location:  Inpatient Procedure: 2D Echo, Cardiac Doppler and Color Doppler Indications:    Stroke  History:        Patient has prior history of Echocardiogram examinations, most                 recent 02/16/2018. Aortic Valve Disease; Risk                 Factors:Dyslipidemia and Former Smoker. CKD.  Sonographer:    Clayton Lefort RDCS (AE) Referring Phys: [place]  1. Left ventricular ejection fraction, by estimation, is 55 to 60%. The left ventricle has normal function. The left ventricle has no regional wall motion abnormalities. There is mild asymmetric left ventricular hypertrophy of the basal-septal segment. Left ventricular diastolic parameters are indeterminate.  2. Right ventricular systolic function is normal. The right ventricular size is normal. There is normal pulmonary artery systolic pressure. The estimated right ventricular systolic pressure is 37.1 mmHg.  3. The mitral valve is grossly normal. Mild mitral valve regurgitation. No evidence of mitral stenosis.  4. The aortic valve is tricuspid. There is moderate calcification of the aortic valve. There is moderate thickening of the aortic valve. Aortic valve regurgitation is mild.  5. Aortic dilatation noted. There is mild dilatation of the aortic root, measuring 42 mm.  6. The inferior vena cava is normal in size with greater than 50% respiratory variability, suggesting right atrial pressure of 3 mmHg. Conclusion(s)/Recommendation(s): No intracardiac source of embolism detected on this transthoracic study. A transesophageal echocardiogram is recommended to exclude cardiac source of embolism if clinically indicated. FINDINGS  Left Ventricle: Left ventricular ejection fraction, by estimation, is 55 to 60%. The left ventricle has normal  function. The left ventricle has no regional wall motion abnormalities. The left ventricular internal cavity size was normal in size. There is  mild asymmetric left ventricular hypertrophy of the basal-septal segment. Left ventricular diastolic parameters are indeterminate. Right Ventricle: The right ventricular size is normal. No increase in right ventricular wall thickness. Right ventricular systolic function is normal. There is normal pulmonary artery systolic pressure. The tricuspid regurgitant velocity is 2.23 m/s, and  with an assumed right atrial pressure of 3 mmHg, the estimated right ventricular systolic pressure is 69.6 mmHg. Left Atrium: Left atrial size was normal in size. Right Atrium: Right atrial size was normal in size. Pericardium: Trivial pericardial effusion is present. Mitral Valve: The mitral valve is grossly normal. Mild mitral valve regurgitation. No evidence of mitral valve stenosis. Tricuspid Valve: The tricuspid valve is grossly normal. Tricuspid valve regurgitation is not demonstrated. No evidence of tricuspid stenosis. Aortic Valve: The aortic valve is tricuspid. There is moderate calcification of the aortic valve. There is moderate thickening of the aortic valve. Aortic valve regurgitation is mild. Aortic regurgitation PHT measures 528 msec. Aortic valve mean gradient  measures 5.5 mmHg. Aortic valve peak  gradient measures 11.1 mmHg. Aortic valve area, by VTI measures 2.25 cm. Pulmonic Valve: The pulmonic valve was grossly normal. Pulmonic valve regurgitation is not visualized. No evidence of pulmonic stenosis. Aorta: Aortic dilatation noted. There is mild dilatation of the aortic root, measuring 42 mm. Venous: The inferior vena cava is normal in size with greater than 50% respiratory variability, suggesting right atrial pressure of 3 mmHg. IAS/Shunts: The atrial septum is grossly normal.  LEFT VENTRICLE PLAX 2D LVIDd:         4.00 cm LVIDs:         2.70 cm LV PW:         1.20 cm LV  IVS:        1.10 cm LVOT diam:     1.80 cm LV SV:         72 LV SV Index:   42 LVOT Area:     2.54 cm  RIGHT VENTRICLE             IVC RV Basal diam:  3.20 cm     IVC diam: 1.20 cm RV S prime:     16.30 cm/s TAPSE (M-mode): 2.0 cm LEFT ATRIUM           Index       RIGHT ATRIUM           Index LA diam:      2.30 cm 1.34 cm/m  RA Area:     14.70 cm LA Vol (A2C): 30.4 ml 17.69 ml/m RA Volume:   33.70 ml  19.61 ml/m LA Vol (A4C): 30.8 ml 17.92 ml/m  AORTIC VALVE AV Area (Vmax):    2.48 cm AV Area (Vmean):   2.45 cm AV Area (VTI):     2.25 cm AV Vmax:           166.25 cm/s AV Vmean:          109.000 cm/s AV VTI:            0.322 m AV Peak Grad:      11.1 mmHg AV Mean Grad:      5.5 mmHg LVOT Vmax:         162.00 cm/s LVOT Vmean:        105.000 cm/s LVOT VTI:          0.284 m LVOT/AV VTI ratio: 0.88 AI PHT:            528 msec  AORTA Ao Root diam: 4.20 cm TRICUSPID VALVE TR Peak grad:   19.9 mmHg TR Vmax:        223.00 cm/s  SHUNTS Systemic VTI:  0.28 m Systemic Diam: 1.80 cm Eleonore Chiquito MD Electronically signed by Eleonore Chiquito MD Signature Date/Time: 08/22/2020/1:23:54 PM    Final         Scheduled Meds:   stroke: mapping our early stages of recovery book   Does not apply Once   aspirin EC  81 mg Oral Daily   carbamazepine  100 mg Oral BID   ezetimibe  10 mg Oral Daily   feeding supplement  237 mL Oral BID BM   heparin injection (subcutaneous)  5,000 Units Subcutaneous Q8H   levETIRAcetam  1,500 mg Oral BID   multivitamin with minerals  1 tablet Oral Daily   pantoprazole  40 mg Oral Daily   senna-docusate  1 tablet Oral BID   Continuous Infusions:   LOS: 2 days    Time spent: 66mins    [employer]  Roderic Palau, MD [facility]   If 7PM-7AM, please contact night-coverage [email or web address]  08/23/2020, 7:08 PM

## 2020-08-23 NOTE — Progress Notes (Addendum)
STROKE TEAM PROGRESS NOTE   INTERVAL HISTORY His daughter is at the bedside.  He still has hiccups intermittantly, but reports they have improved with medication.  Vitals:   08/22/20 1300 08/22/20 2100 08/23/20 0012 08/23/20 0407  BP: 129/69  105/65   Pulse: 91     Resp: (!) 21  20 16   Temp:  97.8 F (36.6 C) 98.2 F (36.8 C) 97.9 F (36.6 C)  TempSrc:  Axillary Oral Oral  SpO2: 96%      CBC:  Recent Labs  Lab 08/21/20 1630 08/23/20 0207  WBC 9.2 8.2  HGB 11.0* 10.4*  HCT 33.2* 31.3*  MCV 92.2 90.5  PLT 350 732   Basic Metabolic Panel:  Recent Labs  Lab 08/21/20 1630 08/23/20 0207  NA 133* 133*  K 3.9 3.4*  CL 97* 97*  CO2 28 26  GLUCOSE 158* 103*  BUN 36* 31*  CREATININE 1.41* 1.47*  CALCIUM 9.1 8.9    Lipid Panel:  Recent Labs  Lab 08/23/20 0207  CHOL 131  TRIG 80  HDL 38*  CHOLHDL 3.4  VLDL 16  LDLCALC 77    HgbA1c:  Recent Labs  Lab 08/22/20 1458  HGBA1C 5.5   Urine Drug Screen: No results for input(s): LABOPIA, COCAINSCRNUR, LABBENZ, AMPHETMU, THCU, LABBARB in the last 168 hours.  Alcohol Level No results for input(s): ETH in the last 168 hours.  IMAGING past 24 hours CT ANGIO HEAD W OR WO CONTRAST  Result Date: 08/22/2020 CLINICAL DATA:  Stroke/TIA. EXAM: CT ANGIOGRAPHY HEAD AND NECK TECHNIQUE: Multidetector CT imaging of the head and neck was performed using the standard protocol during bolus administration of intravenous contrast. Multiplanar CT image reconstructions and MIPs were obtained to evaluate the vascular anatomy. Carotid stenosis measurements (when applicable) are obtained utilizing NASCET criteria, using the distal internal carotid diameter as the denominator. CONTRAST:  16mL OMNIPAQUE IOHEXOL 350 MG/ML SOLN COMPARISON:  MRI and CT head August 21, 2020.  CTA December 08, 2019. FINDINGS: CT HEAD FINDINGS Brain: Evolving central hypodensity with peripheral faint hyperdensity in the right basal ganglia. Suspected infarct along the  frontal horn left lateral ventricle was better characterized on prior MRI. Similar adjacent rounded area of hyperdensity along the margin of the right lateral ventricle, likely hemorrhage. No progressive mass effect. Additional moderate patchy white matter hypoattenuation, most likely related to chronic microvascular ischemic disease. No evidence of interval acute large vascular territory infarct. Moderate atrophy with ex vacuo ventricular dilation. No hydrocephalus. No extra-axial fluid collection. Vascular: See below. Skull: No acute fracture. Sinuses: Mucosal thickening of the posteromedial left sphenoid sinus and atretic left maxillary sinus. No acute orbital findings. Other: No mastoid effusions. Review of the MIP images confirms the above findings CTA NECK FINDINGS Aortic arch: Right carotid system: No evidence of dissection, stenosis (50% or greater) or occlusion. Predominantly calcific atherosclerosis at the carotid bifurcation without greater than 50% stenosis. Left carotid system: The left common carotid origin is not imaged. No visible significant (greater than 50%) stenosis of the common carotid artery or internal carotid artery with calcific atherosclerosis at the carotid bifurcation. Mild narrowing of the ICA at the skull base, similar to prior. Vertebral arteries: Severe stenosis of bilateral vertebral arteries at their origins. Otherwise, vertebral arteries are patent. Mildly left dominant. Skeleton: Moderate degenerative disease at C5-C6 and C6-C7. REmote compression fracture in the incidentally imaged thoracic spine, similar to prior. Other neck: No acute abnormality. Similar 13 mm hypodense nodule within the thyroid gland which does not  require further imaging follow-up per current guidelines. Upper chest: Biapical pleuroparenchymal scarring. Otherwise, visualized lung apices are clear. Review of the MIP images confirms the above findings CTA HEAD FINDINGS Anterior circulation: No large vessel  occlusion or proximal hemodynamically significant stenosis. Predominately calcific bilateral ICA atherosclerosis without evidence of greater than 50% narrowing. No aneurysm identified. No evidence of a vascular malformation in the region of the right basal ganglia hemorrhage. Posterior circulation: Bilateral intradural vertebral arteries, basilar artery and PCAs are patent without evidence of hemodynamically significant stenosis. Right PCA is supplied by a small right P1 PCA and right posterior communicating artery, similar to prior. No aneurysm identified. Venous sinuses: As permitted by contrast timing, patent. Review of the MIP images confirms the above findings IMPRESSION: CT head: 1. Similar findings of suspected evolving hemorrhagic infarct in the right basal ganglia, better characterized on recent MRI. No progressive mass effect or evidence of new hemorrhage. 2. Moderate chronic microvascular ischemic disease and atrophy. CTA Head: 1. No large vessel occlusion or proximal hemodynamically significant stenosis intracranially. 2. No evidence of a vascular malformation in the region of the right basal ganglia hemorrhage. CTA Neck: 1. Similar severe bilateral vertebral artery origin stenosis. 2. Bilateral carotid bifurcation atherosclerosis without greater than 50% narrowing. Electronically Signed   By: Margaretha Sheffield MD   On: 08/22/2020 17:57   CT ANGIO NECK W OR WO CONTRAST  Result Date: 08/22/2020 CLINICAL DATA:  Stroke/TIA. EXAM: CT ANGIOGRAPHY HEAD AND NECK TECHNIQUE: Multidetector CT imaging of the head and neck was performed using the standard protocol during bolus administration of intravenous contrast. Multiplanar CT image reconstructions and MIPs were obtained to evaluate the vascular anatomy. Carotid stenosis measurements (when applicable) are obtained utilizing NASCET criteria, using the distal internal carotid diameter as the denominator. CONTRAST:  117mL OMNIPAQUE IOHEXOL 350 MG/ML SOLN  COMPARISON:  MRI and CT head August 21, 2020.  CTA December 08, 2019. FINDINGS: CT HEAD FINDINGS Brain: Evolving central hypodensity with peripheral faint hyperdensity in the right basal ganglia. Suspected infarct along the frontal horn left lateral ventricle was better characterized on prior MRI. Similar adjacent rounded area of hyperdensity along the margin of the right lateral ventricle, likely hemorrhage. No progressive mass effect. Additional moderate patchy white matter hypoattenuation, most likely related to chronic microvascular ischemic disease. No evidence of interval acute large vascular territory infarct. Moderate atrophy with ex vacuo ventricular dilation. No hydrocephalus. No extra-axial fluid collection. Vascular: See below. Skull: No acute fracture. Sinuses: Mucosal thickening of the posteromedial left sphenoid sinus and atretic left maxillary sinus. No acute orbital findings. Other: No mastoid effusions. Review of the MIP images confirms the above findings CTA NECK FINDINGS Aortic arch: Right carotid system: No evidence of dissection, stenosis (50% or greater) or occlusion. Predominantly calcific atherosclerosis at the carotid bifurcation without greater than 50% stenosis. Left carotid system: The left common carotid origin is not imaged. No visible significant (greater than 50%) stenosis of the common carotid artery or internal carotid artery with calcific atherosclerosis at the carotid bifurcation. Mild narrowing of the ICA at the skull base, similar to prior. Vertebral arteries: Severe stenosis of bilateral vertebral arteries at their origins. Otherwise, vertebral arteries are patent. Mildly left dominant. Skeleton: Moderate degenerative disease at C5-C6 and C6-C7. REmote compression fracture in the incidentally imaged thoracic spine, similar to prior. Other neck: No acute abnormality. Similar 13 mm hypodense nodule within the thyroid gland which does not require further imaging follow-up per  current guidelines. Upper chest: Biapical pleuroparenchymal scarring. Otherwise,  visualized lung apices are clear. Review of the MIP images confirms the above findings CTA HEAD FINDINGS Anterior circulation: No large vessel occlusion or proximal hemodynamically significant stenosis. Predominately calcific bilateral ICA atherosclerosis without evidence of greater than 50% narrowing. No aneurysm identified. No evidence of a vascular malformation in the region of the right basal ganglia hemorrhage. Posterior circulation: Bilateral intradural vertebral arteries, basilar artery and PCAs are patent without evidence of hemodynamically significant stenosis. Right PCA is supplied by a small right P1 PCA and right posterior communicating artery, similar to prior. No aneurysm identified. Venous sinuses: As permitted by contrast timing, patent. Review of the MIP images confirms the above findings IMPRESSION: CT head: 1. Similar findings of suspected evolving hemorrhagic infarct in the right basal ganglia, better characterized on recent MRI. No progressive mass effect or evidence of new hemorrhage. 2. Moderate chronic microvascular ischemic disease and atrophy. CTA Head: 1. No large vessel occlusion or proximal hemodynamically significant stenosis intracranially. 2. No evidence of a vascular malformation in the region of the right basal ganglia hemorrhage. CTA Neck: 1. Similar severe bilateral vertebral artery origin stenosis. 2. Bilateral carotid bifurcation atherosclerosis without greater than 50% narrowing. Electronically Signed   By: Margaretha Sheffield MD   On: 08/22/2020 17:57    PHYSICAL EXAM General: Appears well-developed frail, elderly; mild distress with hiccups. Psych: Affect appropriate to situation Eyes: No scleral injection HENT: No OP obstrucion Head: Normocephalic.  Cardiovascular: Normal rate and regular rhythm.  Respiratory: Effort normal and breath sounds normal to anterior ascultation GI: Soft.  No  distension. There is no tenderness.  Skin: WDI    Neurological Examination Mental Status: Alert, oriented, thought content appropriate.  Speech fluent without evidence of aphasia. Able to follow 3 step commands without difficulty. Cranial Nerves: OS is non reactive, OD ptosis, gaze slightly dysconjugate. Mild left facial droop and weakness. Tongue midline. Shrugs equal. Sensation on face is equal Motor: Tone and bulk:normal tone for age.Right arm and leg 4/5, drift. Left weaker more distally in leg 2/5. Grips are equal Sensory: light touch intact throughout, bilaterally, noted some extinction to DSS Deep Tendon Reflexes: 1+ and symmetric throughout Cerebellar: normal finger-to-nose, normal rapid alternating movements and normal heel-to-shin test Gait: did not test  ASSESSMENT/PLAN [name]. is a 87 y.o. male with history of polio in childhood who presents with worsening left sided weakness and left facial droop along with R foot drop and more freq falls and gen decline at home x 1 month. CT Head with R BG which likely explains his left face, arm and leg weakness with L foot drop. Daughters concerned about safety at home, he does have a history of falls with a hip fracture.  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 Small vessel disease. Atrophy.  CTA head & neck neg MRI  subacute ischemic infarct with some hemorrhagic component noted; there is extensive chronic small vessel disease which likely accounts for the "hazy abnormality" in the left periventricular finding mentioned in report.  2D Echo pending LDL 77 HgbA1c 5.5 VTE prophylaxis - Heparin subcu. aspirin 81 mg daily prior to admission, now on aspirin 81 mg daily.  No DAPT given hemorrhagic conversion. Therapy recommendations:  CIR Disposition:  pending  Hypertension Home meds:  Hydrodiuril,  Prinzide, Zestoretic Stable No role in Permissive hypertension, gradually normalize BP in 2 to 3 days. Long-term BP goal normotensive  Hyperlipidemia  Home meds:  Zetia, resumed in hospital LDL 77, goal < 70 No need to change home med to high intensity statin given advanced age and near goal.   Other Stroke Risk Factors Advanced Age >/= 2  Hx stroke/TIA  Other Active Problems Epilepsy- no breakthrough seizures reported in over 3yrs. No EEG needed at this time. Continue on home Carbatrol 100mg  BID and 1500mg  Keppra BID Baseline debility from childhood polio [facility] day # 2  Desiree Metzger-Cihelka, ARNP-C, ANVP-BC Pager: [phone]   ATTENDING NOTE: I reviewed above note and agree with the assessment and plan. Pt was seen and examined.   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  MRI showed right BG infarct with hemorrhagic conversion.  CTA head and neck showed bilateral vertebral artery origin stenosis.  LDL 77, A1c 5.5.  Creatinine 1.47, sodium 133.  Etiology for patient's stroke likely small vessel disease source given location.  The stroke and hemorrhagic transformation looks subacute, okay to start aspirin 81 for stroke prevention and heparin subcu for DVT prophylaxis.  No DAPT given hemorrhagic transformation.  Continue Zetia, no statin given advanced age and LDL near goal.  Continue Tegretol and Keppra for seizure prevention.  PT/OT recommend CIR.  For detailed assessment and plan, please refer to above as I have made changes wherever appropriate.   Neurology will sign off. Please call with questions. Pt will  follow up with Dr. Leta Baptist at [facility] in about 4 weeks. Thanks for the consult.   Rosalin Hawking, MD PhD Stroke Neurology 08/23/2020 5:28 PM    To contact Stroke Continuity provider, please refer to [email or web address] After hours, contact General Neurology

## 2020-08-23 NOTE — Plan of Care (Signed)

## 2020-08-23 NOTE — Progress Notes (Signed)
  Speech Language Pathology  Patient Details Name: Charles Randall. MRN: [id number] DOB: 08-05-1927 Today's Date: 08/23/2020 Time:  -      Pt passed the [facility] swallow screen. RN states pt is doing well except for larger multivitamin pills which she is now giving in applesauce and pt swallowing without difficulty.  No need for swallow assessment. Will initiate speech-language-cognitive eval as soon as able.    Houston Siren 08/23/2020, 9:24 AM   Orbie Pyo Colvin Caroli.Ed Risk analyst [phone] Office [phone]

## 2020-08-23 NOTE — Progress Notes (Signed)
Inpatient Rehab Admissions Coordinator Note:   Per therapy recommendations, pt was screened for CIR candidacy by Clemens Catholic, [place] CCC-SLP. At this time, Pt. Appears to have functional decline and is a potential candidate for CIR. Will place order for rehab consult per protocol and have request rehab MD review for medical necessity.  Please contact me with questions.   Clemens Catholic, [place], [place] Admissions Coordinator  [phone] ([place]) [phone] (office)

## 2020-08-23 NOTE — Evaluation (Signed)
Physical Therapy Evaluation Patient Details Name: Charles Randall. MRN: [id number] DOB: 05/08/1927 Today's Date: 08/23/2020   History of Present Illness  87 y.o. 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   Pt presents to ED with frequent falls onset over the past 1.5 weeks.  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  Clinical Impression   Pt admitted secondary to problem above with deficits below. PTA patient was ambulating with RW with recent decline in strength and had multiple falls at home.  Pt currently requires +2 assist for all standing and transfers. Patient has had a significant decline in functional mobility and is eager to participate with therapies. Anticipate patient will benefit from PT to address problems listed below.Will continue to follow acutely to maximize functional mobility independence and safety.       Follow Up Recommendations CIR    Equipment Recommendations  Other (comment) (TBA at next venue)    Recommendations for Other Services Rehab consult     Precautions / Restrictions Precautions Precautions: Fall Precaution Comments: reports recent falls he would "just fall over" in any direction      Mobility  Bed Mobility Overal bed mobility: Needs Assistance Bed Mobility: Rolling;Sidelying to Sit Rolling: Mod assist Sidelying to sit: Max assist;+2 for physical assistance;HOB elevated            Transfers Overall transfer level: Needs assistance Equipment used: Rolling walker (2 wheeled);2 person hand held assist Transfers: Sit to/from [facility] Sit to Stand: Max assist;Mod assist;+2 physical assistance Stand pivot transfers: Max assist;+2 physical assistance;From elevated surface       General transfer comment: Attempted stand with RW and +2 assist with pt unable to fully  stand. RW removed and 2 person face-to-face transfer with patient required max assist to weight shift and take pivotal steps.  Ambulation/Gait             General Gait Details: unable at this time; pivotal steps only with +2 max assist  Stairs            Wheelchair Mobility    Modified Rankin (Stroke Patients Only)       Balance Overall balance assessment: Needs assistance Sitting-balance support: Feet supported Sitting balance-Leahy Scale: Poor   Postural control: Posterior lean;Right lateral lean Standing balance support: Bilateral upper extremity supported Standing balance-Leahy Scale: Poor Standing balance comment: bil UE support via therapists's arms                             Pertinent Vitals/Pain Pain Assessment: Faces Faces Pain Scale: Hurts little more Pain Location: abdomen Pain Descriptors / Indicators: Sharp (with hiccups) Pain Intervention(s): Monitored during session;Other (comment) (educated on splinting side with a pillow)    [place] expects to be discharged to:: Private residence Living Arrangements: Spouse/significant other Available Help at Discharge: Family;Available 24 hours/day (cannot provide lifting assist) Type of Home: House Home Access: Stairs to enter Entrance Stairs-Rails: Right;Left;Can reach both Entrance Stairs-Number of Steps: 4 Home Layout: One level Home Equipment: Walker - 2 wheels;Bedside commode;Adaptive equipment;Hand held shower head;Grab bars - tub/shower;Grab bars - toilet;Shower seat;Wheelchair - manual      Prior Function Level of Independence: Needs assistance   Gait / Transfers Assistance Needed: Per OT note 6/25, daughters discuss declining mobility over the last few  months.  Increasing falls with use of 2WRW  ADL's / Homemaking Assistance Needed: SO performs all home mangement, meals, assists with LB ADL and assists with community mobility.        Hand Dominance   Dominant  Hand: Right    Extremity/Trunk Assessment   Upper Extremity Assessment Upper Extremity Assessment: Generalized weakness    Lower Extremity Assessment Lower Extremity Assessment: RLE deficits/detail;LLE deficits/detail RLE Deficits / Details: hip extension 3+, knee extension 3+, ankle DF 3+ LLE Deficits / Details: PROM left ankle DF -10 degrees from neutral; hip extension 2+, knee extension 2+, ankle DF 2+    Cervical / Trunk Assessment Cervical / Trunk Assessment: Kyphotic  Communication   Communication: No difficulties  Cognition Arousal/Alertness: Awake/alert Behavior During Therapy: WFL for tasks assessed/performed;Flat affect Overall Cognitive Status: Within Functional Limits for tasks assessed                                        General Comments General comments (skin integrity, edema, etc.): daughter present throughout session; all questions addressed    Exercises General Exercises - Lower Extremity Ankle Circles/Pumps: AROM;Both;10 reps;Seated Long Arc Quad: AROM;Both;5 reps   Assessment/Plan    PT Assessment Patient needs continued PT services  PT Problem List Decreased strength;Decreased activity tolerance;Decreased balance;Decreased mobility;Decreased knowledge of use of DME       PT Treatment Interventions DME instruction;Gait training;Functional mobility training;Therapeutic activities;Therapeutic exercise;Balance training;Patient/family education    PT Goals (Current goals can be found in the Care Plan section)  Acute Rehab PT Goals Patient Stated Goal: Would like to go home, but needs to be able to walk without help PT Goal Formulation: With patient/family Time For Goal Achievement: 09/07/20 Potential to Achieve Goals: Good    Frequency Min 3X/week   Barriers to discharge        Co-evaluation               AM-PAC PT "6 Clicks" Mobility  Outcome Measure Help needed turning from your back to your side while in a flat bed  without using bedrails?: A Lot Help needed moving from lying on your back to sitting on the side of a flat bed without using bedrails?: Total Help needed moving to and from a bed to a chair (including a wheelchair)?: Total Help needed standing up from a chair using your arms (e.g., wheelchair or bedside chair)?: Total Help needed to walk in hospital room?: Total Help needed climbing 3-5 steps with a railing? : Total 6 Click Score: 7    End of Session Equipment Utilized During Treatment: Gait belt Activity Tolerance: Patient tolerated treatment well Patient left: in chair;with call bell/phone within reach;with chair alarm set;with family/visitor present Nurse Communication: Mobility status;Need for lift equipment (recommend stedy) PT Visit Diagnosis: Repeated falls (R29.6);Muscle weakness (generalized) (M62.81);Difficulty in walking, not elsewhere classified (R26.2)    Time: 9735-3299 PT Time Calculation (min) (ACUTE ONLY): 24 min   Charges:   PT Evaluation $PT Eval Moderate Complexity: 1 Mod           Arby Barrette, PT Pager [phone]   Rexanne Mano 08/23/2020, 12:47 PM

## 2020-08-24 LAB — BASIC METABOLIC PANEL
Anion gap: 12 (ref 5–15)
BUN: 31 mg/dL — ABNORMAL HIGH (ref 8–23)
CO2: 22 mmol/L (ref 22–32)
Calcium: 9 mg/dL (ref 8.9–10.3)
Chloride: 98 mmol/L (ref 98–111)
Creatinine, Ser: 1.5 mg/dL — ABNORMAL HIGH (ref 0.61–1.24)
GFR, Estimated: 44 mL/min — ABNORMAL LOW (ref >60)
Glucose, Bld: 102 mg/dL — ABNORMAL HIGH (ref 70–99)
Potassium: 4 mmol/L (ref 3.5–5.1)
Sodium: 132 mmol/L — ABNORMAL LOW (ref 135–145)

## 2020-08-24 LAB — MAGNESIUM: Magnesium: 2.1 mg/dL (ref 1.7–2.4)

## 2020-08-24 LAB — SARS CORONAVIRUS 2 (TAT 6-24 HRS): SARS Coronavirus 2: NEGATIVE

## 2020-08-24 NOTE — Plan of Care (Signed)

## 2020-08-24 NOTE — Progress Notes (Signed)
[facility]) Hospital Liaison note:  This is a pending outpatient-based Palliative Care patient. Will continue to follow for disposition.  Please call with any outpatient palliative questions or concerns.  Thank you, Lorelee Market, LPN [facility] Liaison [phone]

## 2020-08-24 NOTE — NC FL2 (Signed)
[place] LEVEL OF CARE SCREENING TOOL     IDENTIFICATION  Patient Name: Charles Randall. Birthdate: 02/08/1928 Sex: male Admission Date (Current Location): 08/21/2020  [facility] and [place] Number:  Herbalist and Address:  [facility], [place] [address], [place], [place] [address]      Provider Number: [id number]  Attending Physician Name and Address:  Kathie Dike, MD  Relative Name and Phone Number:  Tammy Elders, [phone]    Current Level of Care: Hospital Recommended Level of Care: [place] Prior Approval Number:    Date Approved/Denied:   PASRR Number: [id number]  Discharge Plan: SNF    Current Diagnoses: Patient Active Problem List   Diagnosis Date Noted   Hemorrhagic stroke ([place]) 61/95/0932   Acute diastolic CHF (congestive heart failure) ([place])    History of hemiarthroplasty of left hip 02/13/2018   Closed fracture of neck of left femur ([place]) 02/12/2018   Moderate aortic regurgitation 02/12/2018   CKD (chronic kidney disease), stage III ([place]) 02/12/2018   Displaced fracture of left femoral neck ([place]) 02/12/2018   Left displaced femoral neck fracture ([place]) 02/12/2018   Arterial insufficiency of lower extremity ([place]) 11/22/2017   Bilateral inguinal hernia (BIH) s/p lap hernia repair w mesh 08/17/2017 08/17/2017   Femoral hernia - right - s/p lap hernia repair w mesh 08/17/2017 08/17/2017   Dysphagia 04/06/2016   Partial symptomatic epilepsy with complex partial seizures, not intractable, without status epilepticus ([place]) 11/16/2015   Normocytic anemia 10/13/2015   Seizure disorder ([place]) 05/12/2015   Edema of lower extremity 04/09/2015   Benign essential hypertension 04/09/2015   Pleural effusion, right 04/09/2015   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   Memory loss 04/07/2015   Mixed hyperlipidemia 04/07/2015   Pain of left thumb 04/07/2015   Simple partial seizure disorder ([place]) 04/07/2015   Skin  lesion of right arm 04/07/2015   Transient alteration of awareness 04/07/2015    Orientation RESPIRATION BLADDER Height & Weight     Self, Time, Situation, Place  Normal Incontinent, External catheter Weight:   Height:     BEHAVIORAL SYMPTOMS/MOOD NEUROLOGICAL BOWEL NUTRITION STATUS      Continent Diet (See DC summary)  AMBULATORY STATUS COMMUNICATION OF NEEDS Skin   Extensive Assist Verbally Skin abrasions (Bilateral arm and head abrasions with foam dressing)                       Personal Care Assistance Level of Assistance  Bathing, Feeding, Dressing Bathing Assistance: Maximum assistance Feeding assistance: Independent Dressing Assistance: Maximum assistance     Functional Limitations Info  Sight, Hearing, Speech Sight Info: Impaired Hearing Info: Adequate Speech Info: Adequate    SPECIAL CARE FACTORS FREQUENCY  PT (By licensed PT), OT (By licensed OT)     PT Frequency: 5x week OT Frequency: 5x week            Contractures Contractures Info: Not present    Additional Factors Info  Code Status, Allergies Code Status Info: DNR Allergies Info: NKA           Current Medications (08/24/2020):  This is the current hospital active medication list Current Facility-Administered Medications  Medication Dose Route Frequency Provider Last Rate Last Admin    stroke: mapping our early stages of recovery book   Does not apply Once Alcario Drought, Jared M, DO       acetaminophen (TYLENOL) tablet 650 mg  650 mg Oral  Q4H PRN Etta Quill, DO       Or   acetaminophen (TYLENOL) 160 MG/5ML solution 650 mg  650 mg Per Tube Q4H PRN Etta Quill, DO       Or   acetaminophen (TYLENOL) suppository 650 mg  650 mg Rectal Q4H PRN Etta Quill, DO       aspirin EC tablet 81 mg  81 mg Oral Daily Rosalin Hawking, MD   81 mg at 08/24/20 1109   carbamazepine (TEGRETOL XR) 12 hr tablet 100 mg  100 mg Oral BID Etta Quill, DO   100 mg at 08/24/20 1109   chlorproMAZINE  (THORAZINE) tablet 25 mg  25 mg Oral TID PRN Kathie Dike, MD       ezetimibe (ZETIA) tablet 10 mg  10 mg Oral Daily Alcario Drought, Jared M, DO   10 mg at 08/24/20 1109   feeding supplement (ENSURE ENLIVE / ENSURE PLUS) liquid 237 mL  237 mL Oral BID BM Kathie Dike, MD   237 mL at 08/24/20 1109   heparin injection 5,000 Units  5,000 Units Subcutaneous Lina Sar, MD   5,000 Units at 08/24/20 0617   levETIRAcetam (KEPPRA) tablet 1,500 mg  1,500 mg Oral BID Etta Quill, DO   1,500 mg at 08/24/20 1108   multivitamin with minerals tablet 1 tablet  1 tablet Oral Daily Kathie Dike, MD   1 tablet at 08/24/20 1108   pantoprazole (PROTONIX) EC tablet 40 mg  40 mg Oral Daily Rosalin Hawking, MD   40 mg at 08/24/20 1110   senna-docusate (Senokot-S) tablet 1 tablet  1 tablet Oral BID Etta Quill, DO   1 tablet at 08/24/20 1109     Discharge Medications: Please see discharge summary for a list of discharge medications.  Relevant Imaging Results:  Relevant Lab Results:   Additional Information SS# [id number]  Coralee Pesa, [place]

## 2020-08-24 NOTE — Plan of Care (Signed)
  Problem: Activity: Goal: Risk for activity intolerance will decrease Outcome: Progressing   Problem: Nutrition: Goal: Adequate nutrition will be maintained Outcome: Progressing   Problem: Coping: Goal: Level of anxiety will decrease Outcome: Progressing   Problem: Elimination: Goal: Will not experience complications related to bowel motility Outcome: Progressing   Problem: Pain Managment: Goal: General experience of comfort will improve Outcome: Progressing   

## 2020-08-24 NOTE — TOC Initial Note (Addendum)
Transition of Care (TOC) - Initial/Assessment Note    Patient Details  Name: Charles Randall. MRN: [id number] Date of Birth: [birth year]  Transition of Care [facility]) CM/SW Contact:    Coralee Pesa, [place] Phone Number: 08/24/2020, 1:02 PM  Clinical Narrative:                 CSW spoke with pt at bedside to discuss recommendation for SNF placement. Pt is agreeable to SNF and states he has been before, but cannot remember the name. He noted that he has had 2 covid vaccines, but no boosters, he is unsure if he wants a booster. He requested that CSW speak with his daughter Lynelle Smoke to discuss further.  CSW spoke with Tammy who advised CSW that pt had been to [facility], and that would be there first choice, they are agreeable to additional fax out as well. She was informed of Medicare. Gov and stated she would look into it. She also requested her fathers girlfriend not be involved in the SNF process, and that it should be discussed directly with her or her sister. CSW will workup and faxout pt for SNF offers. TOC will continue to follow.   2:30: Tammy given bed offers, and they are all in agreement to return to [facility]. [facility] can take pt tomorrow. CSW updated MD and requested a covid.  Expected Discharge Plan: Skilled Nursing Facility Barriers to Discharge: SNF Pending bed offer, Continued Medical Work up   Patient Goals and CMS Choice Patient states their goals for this hospitalization and ongoing recovery are:: Pt states he is agreeable to SNF placement before returning home. CMS Medicare.gov Compare Post Acute Care list provided to:: Patient Choice offered to / list presented to : Patient  Expected Discharge Plan and Services Expected Discharge Plan: [place] Choice: [place] arrangements for the past 2 months: Single Family Home                                      Prior Living Arrangements/Services Living arrangements for  the past 2 months: Single Family Home Lives with:: Self Patient language and need for interpreter reviewed:: Yes Do you feel safe going back to the place where you live?: Yes      Need for Family Participation in Patient Care: Yes (Comment) Care giver support system in place?: Yes (comment)   Criminal Activity/Legal Involvement Pertinent to Current Situation/Hospitalization: No - Comment as needed  Activities of Daily Living Home Assistive Devices/Equipment: Eyeglasses, Gilford Rile (specify type) ADL Screening (condition at time of admission) Patient's cognitive ability adequate to safely complete daily activities?: No Is the patient deaf or have difficulty hearing?: No Does the patient have difficulty seeing, even when wearing glasses/contacts?: No Does the patient have difficulty concentrating, remembering, or making decisions?: Yes Patient able to express need for assistance with ADLs?: Yes Does the patient have difficulty dressing or bathing?: Yes Independently performs ADLs?: No Communication: Independent Dressing (OT): Needs assistance Is this a change from baseline?: Pre-admission baseline Grooming: Independent Feeding: Independent Bathing: Needs assistance Is this a change from baseline?: Pre-admission baseline Toileting: Needs assistance Is this a change from baseline?: Pre-admission baseline In/Out Bed: Needs assistance Is this a change from baseline?: Pre-admission baseline Walks in Home: Needs assistance Is this a change from baseline?: Pre-admission baseline Does the patient have difficulty walking or climbing stairs?:  Yes Weakness of Legs: Both Weakness of Arms/Hands: Both  Permission Sought/Granted Permission sought to share information with : Family Supports Permission granted to share information with : Yes, Verbal Permission Granted  Share Information with NAME: [place] granted to share info w Relationship: Daughter  Permission granted to  share info w Contact Information: [phone]  Emotional Assessment Appearance:: Appears stated age Attitude/Demeanor/Rapport: Engaged Affect (typically observed): Appropriate Orientation: : Oriented to Self, Oriented to Place, Oriented to  Time, Oriented to Situation Alcohol / Substance Use: Not Applicable Psych Involvement: No (comment)  Admission diagnosis:  Hemorrhagic stroke ([place]) [I61.9] Acute hemorrhagic infarction of brain ([place]) [I63.89] Patient Active Problem List   Diagnosis Date Noted   Hemorrhagic stroke ([place]) 31/54/0086   Acute diastolic CHF (congestive heart failure) ([place])    History of hemiarthroplasty of left hip 02/13/2018   Closed fracture of neck of left femur ([place]) 02/12/2018   Moderate aortic regurgitation 02/12/2018   CKD (chronic kidney disease), stage III ([place]) 02/12/2018   Displaced fracture of left femoral neck ([place]) 02/12/2018   Left displaced femoral neck fracture ([place]) 02/12/2018   Arterial insufficiency of lower extremity ([place]) 11/22/2017   Bilateral inguinal hernia (BIH) s/p lap hernia repair w mesh 08/17/2017 08/17/2017   Femoral hernia - right - s/p lap hernia repair w mesh 08/17/2017 08/17/2017   Dysphagia 04/06/2016   Partial symptomatic epilepsy with complex partial seizures, not intractable, without status epilepticus ([place]) 11/16/2015   Normocytic anemia 10/13/2015   Seizure disorder ([place]) 05/12/2015   Edema of lower extremity 04/09/2015   Benign essential hypertension 04/09/2015   Pleural effusion, right 04/09/2015   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) 04/07/2015   Skin lesion of right arm 04/07/2015   Transient alteration of awareness 04/07/2015   PCP:  Nickola Major, MD Pharmacy:   [facility] [address] [place] - [address] [place] [place] [address] Phone:  [phone] Fax: [phone]     Social Determinants of Health (SDOH) Interventions    Readmission Risk Interventions No flowsheet data found.

## 2020-08-24 NOTE — Evaluation (Signed)
Speech Language Pathology Evaluation Patient Details Name: Charles Randall. MRN: [id number] DOB: 19-May-1927 Today's Date: 08/24/2020 Time: 1530-1550 SLP Time Calculation (min) (ACUTE ONLY): 20 min  Problem List:  Patient Active Problem List   Diagnosis Date Noted   Hemorrhagic stroke ([place]) 50/10/3816   Acute diastolic CHF (congestive heart failure) ([place])    History of hemiarthroplasty of left hip 02/13/2018   Closed fracture of neck of left femur ([place]) 02/12/2018   Moderate aortic regurgitation 02/12/2018   CKD (chronic kidney disease), stage III ([place]) 02/12/2018   Displaced fracture of left femoral neck ([place]) 02/12/2018   Left displaced femoral neck fracture ([place]) 02/12/2018   Arterial insufficiency of lower extremity ([place]) 11/22/2017   Bilateral inguinal hernia (BIH) s/p lap hernia repair w mesh 08/17/2017 08/17/2017   Femoral hernia - right - s/p lap hernia repair w mesh 08/17/2017 08/17/2017   Dysphagia 04/06/2016   Partial symptomatic epilepsy with complex partial seizures, not intractable, without status epilepticus ([place]) 11/16/2015   Normocytic anemia 10/13/2015   Seizure disorder ([place]) 05/12/2015   Edema of lower extremity 04/09/2015   Benign essential hypertension 04/09/2015   Pleural effusion, right 04/09/2015   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   Memory loss 04/07/2015   Mixed hyperlipidemia 04/07/2015   Pain of left thumb 04/07/2015   Simple partial seizure disorder ([place]) 04/07/2015   Skin lesion of right arm 04/07/2015   Transient alteration of awareness 04/07/2015   Past Medical History:  Past Medical History:  Diagnosis Date   Anemia    STARTED PILLS 1 MONTH AGO NEW DX   Aortic insufficiency    mild, moderate   Arthritis    FINGERS SWELL PT THINKS ARTHITIS, TOP OF WRIST ALSO SWELLS ONCE IN A WHILE   Bleeds easily (HCC)    [place] EASY ALSO   Chronic kidney disease    Cough    LAST 3 TO 4 WEEKS PHLEGM YELLOW INTERMITTENT COUGH NO FEVER   Dyslipidemia    H/O  dizziness    OCC    H/O Doppler ultrasound 2010   H/O echocardiogram 2013   aortic valve disorder, EF =>55%   H/O echocardiogram 2012   AVD, EF =>55%   Heart murmur    History of hiatal hernia    HTN (hypertension)    Inguinal hernia    Mitral regurgitation    mild   Pneumonia FEW YEARS AGO   X 1   Post-polio syndrome age 55   Seizures ([place])    last sz 2016 NO CAUSE FOUND, 3 seizures on 03/25/18    Swelling of both lower extremities    DOES NOT ALWAYS DO DOWN    Tricuspid regurgitation    mild   Past Surgical History:  Past Surgical History:  Procedure Laterality Date   COLONSCOPY  5 YRS AGO   DENTAL SURGERY  3-4 WEEKS AGO   FEMORAL HERNIA REPAIR Right 08/17/2017   Procedure: HERNIA REPAIR FEMORAL LAPAROSCOPIC;  Surgeon: Michael Boston, MD;  Location: [facility];  Service: General;  Laterality: Right;   INGUINAL HERNIA REPAIR Bilateral 08/17/2017   Procedure: LAPAROSCOPIC BILATERAL INGUINAL HERNIA REPAIR;  Surgeon: Michael Boston, MD;  Location: [facility];  Service: General;  Laterality: Bilateral;   INSERTION OF MESH Bilateral 08/17/2017   Procedure: INSERTION OF MESH;  Surgeon: Michael Boston, MD;  Location: [facility];  Service: General;  Laterality: Bilateral;   TOTAL HIP ARTHROPLASTY Left 02/13/2018   Procedure: HEMI  ARTHROPLASTY;  Surgeon: Paralee Cancel, MD;  Location: [facility];  Service: Orthopedics;  Laterality: Left;   HPI:  87 y.o. 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   Pt presents to ED with frequent falls onset over the past 1.5 weeks.  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   Assessment / Plan / Recommendation Clinical Impression  Patient presents with a moderate cognitive impairment as per this assessment. He was oriented x4, however during verbal and functional tasks he was slow to respond and required extra  processing time. SLP administered the SLUMS ([place] Mental Status exam) and he received a score of 17 which is in range for 'Dementia' (scores between 1-20). He exhibited deficits in delayed recall, attention, awareness and self monitoring. Affect was fairly flat. As patient is to be discharging soon to SNF, SLP is recommending defer treatment until next venue of care.    SLP Assessment  SLP Recommendation/Assessment: All further Speech Lanaguage Pathology  needs can be addressed in the next venue of care SLP Visit Diagnosis: Cognitive communication deficit (R41.841)    Follow Up Recommendations  24 hour supervision/assistance;Skilled Nursing facility    Frequency and Duration           SLP Evaluation Cognition  Overall Cognitive Status: Impaired/Different from baseline Arousal/Alertness: Awake/alert Orientation Level: Oriented X4 Attention: Sustained Sustained Attention: Impaired Sustained Attention Impairment: Verbal basic;Verbal complex Memory: Impaired Memory Impairment: Storage deficit;Retrieval deficit;Other (comment) (recalled 3/5 words after 3 minute delay) Awareness: Impaired Awareness Impairment: Emergent impairment Problem Solving: Impaired Problem Solving Impairment: Verbal complex Executive Function: Self Monitoring Self Monitoring: Impaired Self Monitoring Impairment: Verbal complex;Functional basic Safety/Judgment: Other (comment) (will need further assessment regarding safety when in functional tasks)       Comprehension  Auditory Comprehension Overall Auditory Comprehension: Appears within functional limits for tasks assessed    Expression Expression Primary Mode of Expression: Verbal Verbal Expression Overall Verbal Expression: Impaired Initiation: Impaired Level of Generative/Spontaneous Verbalization: Sentence;Conversation Repetition: No impairment Naming: No impairment Pragmatics: Impairment Impairments: Abnormal  affect;Monotone Interfering Components: Attention Effective Techniques: Open ended questions Non-Verbal Means of Communication: Not applicable Written Expression Dominant Hand: Right   Oral / Motor  Oral Motor/Sensory Function Overall Oral Motor/Sensory Function: Mild impairment Facial ROM: Within Functional Limits Facial Symmetry: Abnormal symmetry left Facial Strength: Reduced left Lingual ROM: Within Functional Limits Lingual Symmetry: Within Functional Limits Lingual Strength: Within Functional Limits Motor Speech Overall Motor Speech: Appears within functional limits for tasks assessed Respiration: Within functional limits   GO                    Sonia Baller, MA, CCC-SLP Speech Therapy

## 2020-08-24 NOTE — Progress Notes (Signed)
PROGRESS NOTE    Charles Randall.  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  CT scan of the head revealed subacute right basal ganglia hemorrhagic infarct.  Patient was admitted to the hospital for further work-up.  Seen by neurology, started on aspirin since stroke was subacute.  Other work-up was unrevealing.  Seen by PT/OT with recommendations for CIR.  Patient is otherwise stable for discharge once discharge disposition can be determined.   Assessment & Plan:   Principal Problem:   Hemorrhagic stroke [facility]) Active Problems:   Benign essential hypertension   Seizure disorder (HCC)   CKD (chronic kidney disease), stage III (HCC)   Subacute hemorrhagic infarct of right basal ganglia Ischemic subacute infarct of the frontal horn of left lateral ventricle -Seen by neurology -Started on aspirin -LDL 77, per neuro, no statin due to advanced age and LDL near goal.  Continue on Zetia -A1c 5.5 -2D echocardiogram with normal EF, without any signs of embolus -Blood pressure goal less than 160 -Seen by PT/OT with recommendations for CIR vs. SNF -Family has elected SNF placement, TOC is working on placement  Seizure disorder -Continue on Keppra and carbamazepine  Chronic kidney disease stage IIIa -Creatinine is currently at baseline -Continue to monitor  Hypertension -Chronically on lisinopril/hydrochlorothiazide -Blood pressure stable -Can resume blood pressure medications if blood pressure starts to trend up  Left kidney mass -Noted on CT scan from 06/2020 -Can consider nonemergent MRI abdomen if family desires further work-up  Thoracic aortic aneurysm -Noted on CT scan from 06/2020, measured at 4.7 cm -Can  consider further surveillance as an outpatient, although I would not anticipate that he be a candidate for any surgical intervention due to his advanced age  Intractable hiccups -Patient reports history of intractable hiccups for the past 2 to 3 weeks -Continue on Protonix -Continue on Thorazine as needed  DVT prophylaxis: heparin injection 5,000 Units Start: 08/23/20 2200 SCD's Start: 08/21/20 2012  Code Status: DNR Family Communication: Updated patient's daughter Charles Randall, who is patient's POA Disposition Plan: Status is: Inpatient  Remains inpatient appropriate because:Ongoing diagnostic testing needed not appropriate for outpatient work up and Unsafe d/c plan  Dispo: The patient is from: Home              Anticipated d/c is to: SNF               Patient currently is medically stable to d/c.   Difficult to place patient No         Consultants:  Neurology  Procedures:  Echocardiogram  Antimicrobials:      Subjective: Reports hiccups are improved.  Right-sided weakness also improving.  Objective: Vitals:   08/23/20 1339 08/23/20 2132 08/23/20 2334 08/24/20 0411  BP: 106/65 139/69 114/62 (!) 171/84  Pulse: (!) 108 95 71 86  Resp: 20 18 19 20   Temp:  98.1 F (36.7 C) 98 F (36.7 C) 98.3 F (36.8 C)  TempSrc:  Oral Oral Oral  SpO2: 92% 97% 97% 94%    Intake/Output Summary (Last 24 hours) at 08/24/2020 1208 Last data filed at 08/24/2020 0420 Gross per 24 hour  Intake --  Output 325 ml  Net -325 ml   There were no vitals filed for this visit.  Examination: General exam: Alert, awake, oriented x 3 Respiratory system: Clear to auscultation. Respiratory effort normal. Cardiovascular system:RRR. No murmurs, rubs, gallops. Gastrointestinal system: Abdomen is nondistended, soft and nontender. No organomegaly or masses felt. Normal bowel sounds heard. Central nervous system: Alert and oriented.  Right upper and lower extremity weakness  improving. Extremities: No C/C/E, +pedal pulses Skin: No rashes, lesions or ulcers Psychiatry: Judgement and insight appear normal. Mood & affect appropriate.      Data Reviewed: I have personally reviewed following labs and imaging studies  CBC: Recent Labs  Lab 08/21/20 1630 08/23/20 0207  WBC 9.2 8.2  HGB 11.0* 10.4*  HCT 33.2* 31.3*  MCV 92.2 90.5  PLT 350 725   Basic Metabolic Panel: Recent Labs  Lab 08/21/20 1630 08/23/20 0207 08/24/20 0228  NA 133* 133* 132*  K 3.9 3.4* 4.0  CL 97* 97* 98  CO2 28 26 22   GLUCOSE 158* 103* 102*  BUN 36* 31* 31*  CREATININE 1.41* 1.47* 1.50*  CALCIUM 9.1 8.9 9.0  MG  --   --  2.1   GFR: CrCl cannot be calculated (Unknown ideal weight.). Liver Function Tests: Recent Labs  Lab 08/21/20 1645  AST 22  ALT 18  ALKPHOS 75  BILITOT 0.4  PROT 6.5  ALBUMIN 2.9*   No results for input(s): LIPASE, AMYLASE in the last 168 hours. No results for input(s): AMMONIA in the last 168 hours. Coagulation Profile: No results for input(s): INR, PROTIME in the last 168 hours. Cardiac Enzymes: No results for input(s): CKTOTAL, CKMB, CKMBINDEX, TROPONINI in the last 168 hours. BNP (last 3 results) No results for input(s): PROBNP in the last 8760 hours. HbA1C: Recent Labs    08/22/20 1458  HGBA1C 5.5   CBG: Recent Labs  Lab 08/21/20 1637  GLUCAP 163*   Lipid Profile: Recent Labs    08/23/20 0207  CHOL 131  HDL 38*  LDLCALC 77  TRIG 80  CHOLHDL 3.4   Thyroid Function Tests: Recent Labs    08/22/20 1458  TSH 4.153   Anemia Panel: Recent Labs    08/22/20 1458  VITAMINB12 1,518*   Sepsis Labs: No results for input(s): PROCALCITON, LATICACIDVEN in the last 168 hours.  Recent Results (from the past 240 hour(s))  SARS CORONAVIRUS 2 (TAT 6-24 HRS) Nasopharyngeal Nasopharyngeal Swab     Status: None   Collection Time: 08/21/20  6:40 PM   Specimen: Nasopharyngeal Swab  Result Value Ref Range Status   SARS Coronavirus  2 NEGATIVE NEGATIVE Final    Comment: (NOTE) SARS-CoV-2 target nucleic acids are NOT DETECTED.  The SARS-CoV-2 RNA is generally detectable in upper and lower respiratory specimens during the acute phase of infection. Negative results do not preclude SARS-CoV-2 infection, do not rule out co-infections with other pathogens, and should not be used as the sole basis for treatment or other patient management decisions. Negative results must be combined with clinical observations, patient history, and epidemiological information. The expected result is Negative.  Fact Sheet for Patients: SugarRoll.be  Fact Sheet for Healthcare Providers: https://www.woods-mathews.com/  This test is not yet approved or cleared by the [place] FDA and  has been authorized for detection and/or diagnosis of SARS-CoV-2 by FDA under an Emergency Use Authorization (EUA). This EUA will remain  in effect (meaning this test can be used) for the duration of the COVID-19 declaration under Se ction 564(b)(1) of the Act, 21 U.S.C. section 360bbb-3(b)(1), unless the authorization is terminated or revoked sooner.  Performed at  [facility], [place] [address], [place], [place] [address]          Radiology Studies: CT ANGIO HEAD W OR WO CONTRAST  Result Date: 08/22/2020 CLINICAL DATA:  Stroke/TIA. EXAM: CT ANGIOGRAPHY HEAD AND NECK TECHNIQUE: Multidetector CT imaging of the head and neck was performed using the standard protocol during bolus administration of intravenous contrast. Multiplanar CT image reconstructions and MIPs were obtained to evaluate the vascular anatomy. Carotid stenosis measurements (when applicable) are obtained utilizing NASCET criteria, using the distal internal carotid diameter as the denominator. CONTRAST:  134mL OMNIPAQUE IOHEXOL 350 MG/ML SOLN COMPARISON:  MRI and CT head August 21, 2020.  CTA December 08, 2019. FINDINGS: CT HEAD FINDINGS Brain:  Evolving central hypodensity with peripheral faint hyperdensity in the right basal ganglia. Suspected infarct along the frontal horn left lateral ventricle was better characterized on prior MRI. Similar adjacent rounded area of hyperdensity along the margin of the right lateral ventricle, likely hemorrhage. No progressive mass effect. Additional moderate patchy white matter hypoattenuation, most likely related to chronic microvascular ischemic disease. No evidence of interval acute large vascular territory infarct. Moderate atrophy with ex vacuo ventricular dilation. No hydrocephalus. No extra-axial fluid collection. Vascular: See below. Skull: No acute fracture. Sinuses: Mucosal thickening of the posteromedial left sphenoid sinus and atretic left maxillary sinus. No acute orbital findings. Other: No mastoid effusions. Review of the MIP images confirms the above findings CTA NECK FINDINGS Aortic arch: Right carotid system: No evidence of dissection, stenosis (50% or greater) or occlusion. Predominantly calcific atherosclerosis at the carotid bifurcation without greater than 50% stenosis. Left carotid system: The left common carotid origin is not imaged. No visible significant (greater than 50%) stenosis of the common carotid artery or internal carotid artery with calcific atherosclerosis at the carotid bifurcation. Mild narrowing of the ICA at the skull base, similar to prior. Vertebral arteries: Severe stenosis of bilateral vertebral arteries at their origins. Otherwise, vertebral arteries are patent. Mildly left dominant. Skeleton: Moderate degenerative disease at C5-C6 and C6-C7. REmote compression fracture in the incidentally imaged thoracic spine, similar to prior. Other neck: No acute abnormality. Similar 13 mm hypodense nodule within the thyroid gland which does not require further imaging follow-up per current guidelines. Upper chest: Biapical pleuroparenchymal scarring. Otherwise, visualized lung apices are  clear. Review of the MIP images confirms the above findings CTA HEAD FINDINGS Anterior circulation: No large vessel occlusion or proximal hemodynamically significant stenosis. Predominately calcific bilateral ICA atherosclerosis without evidence of greater than 50% narrowing. No aneurysm identified. No evidence of a vascular malformation in the region of the right basal ganglia hemorrhage. Posterior circulation: Bilateral intradural vertebral arteries, basilar artery and PCAs are patent without evidence of hemodynamically significant stenosis. Right PCA is supplied by a small right P1 PCA and right posterior communicating artery, similar to prior. No aneurysm identified. Venous sinuses: As permitted by contrast timing, patent. Review of the MIP images confirms the above findings IMPRESSION: CT head: 1. Similar findings of suspected evolving hemorrhagic infarct in the right basal ganglia, better characterized on recent MRI. No progressive mass effect or evidence of new hemorrhage. 2. Moderate chronic microvascular ischemic disease and atrophy. CTA Head: 1. No large vessel occlusion or proximal hemodynamically significant stenosis intracranially. 2. No evidence of a vascular malformation in the region of the right basal ganglia hemorrhage. CTA Neck: 1. Similar severe bilateral vertebral artery origin stenosis. 2. Bilateral carotid bifurcation atherosclerosis without greater than 50% narrowing. Electronically Signed   By: Margaretha Sheffield  MD   On: 08/22/2020 17:57   CT ANGIO NECK W OR WO CONTRAST  Result Date: 08/22/2020 CLINICAL DATA:  Stroke/TIA. EXAM: CT ANGIOGRAPHY HEAD AND NECK TECHNIQUE: Multidetector CT imaging of the head and neck was performed using the standard protocol during bolus administration of intravenous contrast. Multiplanar CT image reconstructions and MIPs were obtained to evaluate the vascular anatomy. Carotid stenosis measurements (when applicable) are obtained utilizing NASCET criteria,  using the distal internal carotid diameter as the denominator. CONTRAST:  131mL OMNIPAQUE IOHEXOL 350 MG/ML SOLN COMPARISON:  MRI and CT head August 21, 2020.  CTA December 08, 2019. FINDINGS: CT HEAD FINDINGS Brain: Evolving central hypodensity with peripheral faint hyperdensity in the right basal ganglia. Suspected infarct along the frontal horn left lateral ventricle was better characterized on prior MRI. Similar adjacent rounded area of hyperdensity along the margin of the right lateral ventricle, likely hemorrhage. No progressive mass effect. Additional moderate patchy white matter hypoattenuation, most likely related to chronic microvascular ischemic disease. No evidence of interval acute large vascular territory infarct. Moderate atrophy with ex vacuo ventricular dilation. No hydrocephalus. No extra-axial fluid collection. Vascular: See below. Skull: No acute fracture. Sinuses: Mucosal thickening of the posteromedial left sphenoid sinus and atretic left maxillary sinus. No acute orbital findings. Other: No mastoid effusions. Review of the MIP images confirms the above findings CTA NECK FINDINGS Aortic arch: Right carotid system: No evidence of dissection, stenosis (50% or greater) or occlusion. Predominantly calcific atherosclerosis at the carotid bifurcation without greater than 50% stenosis. Left carotid system: The left common carotid origin is not imaged. No visible significant (greater than 50%) stenosis of the common carotid artery or internal carotid artery with calcific atherosclerosis at the carotid bifurcation. Mild narrowing of the ICA at the skull base, similar to prior. Vertebral arteries: Severe stenosis of bilateral vertebral arteries at their origins. Otherwise, vertebral arteries are patent. Mildly left dominant. Skeleton: Moderate degenerative disease at C5-C6 and C6-C7. REmote compression fracture in the incidentally imaged thoracic spine, similar to prior. Other neck: No acute abnormality.  Similar 13 mm hypodense nodule within the thyroid gland which does not require further imaging follow-up per current guidelines. Upper chest: Biapical pleuroparenchymal scarring. Otherwise, visualized lung apices are clear. Review of the MIP images confirms the above findings CTA HEAD FINDINGS Anterior circulation: No large vessel occlusion or proximal hemodynamically significant stenosis. Predominately calcific bilateral ICA atherosclerosis without evidence of greater than 50% narrowing. No aneurysm identified. No evidence of a vascular malformation in the region of the right basal ganglia hemorrhage. Posterior circulation: Bilateral intradural vertebral arteries, basilar artery and PCAs are patent without evidence of hemodynamically significant stenosis. Right PCA is supplied by a small right P1 PCA and right posterior communicating artery, similar to prior. No aneurysm identified. Venous sinuses: As permitted by contrast timing, patent. Review of the MIP images confirms the above findings IMPRESSION: CT head: 1. Similar findings of suspected evolving hemorrhagic infarct in the right basal ganglia, better characterized on recent MRI. No progressive mass effect or evidence of new hemorrhage. 2. Moderate chronic microvascular ischemic disease and atrophy. CTA Head: 1. No large vessel occlusion or proximal hemodynamically significant stenosis intracranially. 2. No evidence of a vascular malformation in the region of the right basal ganglia hemorrhage. CTA Neck: 1. Similar severe bilateral vertebral artery origin stenosis. 2. Bilateral carotid bifurcation atherosclerosis without greater than 50% narrowing. Electronically Signed   By: Margaretha Sheffield MD   On: 08/22/2020 17:57        Scheduled  Meds:   stroke: mapping our early stages of recovery book   Does not apply Once   aspirin EC  81 mg Oral Daily   carbamazepine  100 mg Oral BID   ezetimibe  10 mg Oral Daily   feeding supplement  237 mL Oral BID BM    heparin injection (subcutaneous)  5,000 Units Subcutaneous Q8H   levETIRAcetam  1,500 mg Oral BID   multivitamin with minerals  1 tablet Oral Daily   pantoprazole  40 mg Oral Daily   senna-docusate  1 tablet Oral BID   Continuous Infusions:   LOS: 3 days    Time spent: 45mins    Kathie Dike, MD [facility]   If 7PM-7AM, please contact night-coverage [email or web address]  08/24/2020, 12:08 PM

## 2020-08-24 NOTE — Progress Notes (Signed)
Inpatient Rehab Admissions Coordinator:   I met with pt. To discuss potential CIR admit. Daughter present and other daughter was on speaker phone. They state that the would prefer SNF. Pt has been to [facility] previously, but are open to other facilities. CIR will sign off.   Clemens Catholic, [place], [place] Admissions Coordinator  [phone] ([place]) [phone] (office)

## 2020-08-25 MED ORDER — PANTOPRAZOLE SODIUM 40 MG PO TBEC: 40.0000 mg | DELAYED_RELEASE_TABLET | Freq: Every day | ORAL | Status: AC

## 2020-08-25 NOTE — Plan of Care (Signed)
  Problem: Elimination: Goal: Will not experience complications related to bowel motility Outcome: Progressing   Problem: Clinical Measurements: Goal: Respiratory complications will improve Outcome: Progressing Goal: Cardiovascular complication will be avoided Outcome: Progressing

## 2020-08-25 NOTE — Plan of Care (Signed)

## 2020-08-25 NOTE — Discharge Summary (Signed)
Physician Discharge Summary  Charles Randall. 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  CT scan of the head revealed subacute right basal ganglia hemorrhagic infarct.  Patient was admitted to the hospital for further work-up.  Seen by neurology, started on aspirin since stroke was subacute.  Other work-up was unrevealing.  Seen by PT/OT. 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  Continue on Zetia -A1c 5.5 -2D echocardiogram with normal EF, without any signs of embolus -Blood pressure goal less than 160 -Seen by PT/OT with recommendations for CIR vs. SNF -Family has elected SNF placement, He will discharge to [facility] -follow up with neurology in 4 weeks   Seizure disorder -Continue on Keppra and carbamazepine   Chronic kidney disease stage IIIa -Creatinine is currently at baseline -Continue to monitor   Hypertension -Chronically on hydrochlorothiazide -Blood  pressure stable   Left kidney mass -Noted on CT scan from 06/2020 -Can consider nonemergent MRI abdomen if family desires further work-up   Thoracic aortic aneurysm -Noted on CT scan from 06/2020, measured at 4.7 cm -Can consider further surveillance as an outpatient, although I would not anticipate that he be a candidate for any surgical intervention due to his advanced age   Intractable hiccups -Patient reports history of intractable hiccups for the past 2 to 3 weeks -Continue on Protonix -improved with prn thorazine  Discharge Instructions  Discharge Instructions     Ambulatory referral to Neurology   Complete by: As directed    Follow up with Dr. Leta Baptist at [facility] in 4-6 weeks.  Patient is Dr. Gladstone Lighter patient.  Thanks.   Diet - low sodium heart healthy   Complete by: As directed    Increase activity slowly   Complete by: As directed       Allergies as of 08/25/2020   No Known Allergies      Medication List     STOP taking these medications    imiquimod 5 % cream Commonly known as: ALDARA   lisinopril-hydrochlorothiazide 20-25 MG tablet Commonly known as: ZESTORETIC   Magnesium 250 MG Tabs   oxyCODONE 5 MG immediate release tablet Commonly known as: Oxy IR/ROXICODONE   tacrolimus 0.1 % ointment Commonly known as: PROTOPIC       TAKE these medications    aspirin EC 81 MG tablet Take 1 tablet (81 mg total) by mouth every other day.   carbamazepine 100 MG 12 hr capsule Commonly known as: CARBATROL Take 1 capsule (100 mg total) by mouth 2 (two) times daily.   Dupixent 300 MG/2ML Sopn Generic drug:  Dupilumab Inject into the skin. Every 2 weeks,injection   ezetimibe 10 MG tablet Commonly known as: ZETIA TAKE 1 TABLET BY MOUTH ONCE DAILY. THIS IS TO REPLACE THE ATORVASTATIN What changed: See the new instructions.   hydrochlorothiazide 12.5 MG tablet Commonly known as: HYDRODIURIL Take 6.25 mg by mouth daily.   levETIRAcetam 750 MG  tablet Commonly known as: KEPPRA Take 2 tablets (1,500 mg total) by mouth 2 (two) times daily.   magnesium oxide 400 MG tablet Commonly known as: MAG-OX Take 400 mg by mouth daily.   pantoprazole 40 MG tablet Commonly known as: PROTONIX Take 1 tablet (40 mg total) by mouth daily. Start taking on: August 26, 2020   triamcinolone ointment 0.1 % Commonly known as: KENALOG Apply 1 application topically 2 (two) times daily as needed.   vitamin B-12 1000 MCG tablet Commonly known as: CYANOCOBALAMIN Take 1,000 mcg by mouth daily.   vitamin C 1000 MG tablet Take 1,000 mg by mouth daily.   Vitamin D-3 25 MCG (1000 UT) Caps Take 1,000 Units by mouth daily.   vitamin E 180 MG (400 UNITS) capsule Take 400 Units by mouth daily.        Contact information for follow-up providers     Penumalli, Earlean Polka, MD. Schedule an appointment as soon as possible for a visit in 1 month(s).   Specialties: Neurology, Radiology Contact information: [address] [place] [address] [phone]              Contact information for after-discharge care     Destination     [facility] .   Service: Skilled Nursing Contact information: [place] [phone]                    No Known Allergies  Consultations: neurology   Procedures/Studies: CT ANGIO HEAD W OR WO CONTRAST  Result Date: 08/22/2020 CLINICAL DATA:  Stroke/TIA. EXAM: CT ANGIOGRAPHY HEAD AND NECK TECHNIQUE: Multidetector CT imaging of the head and neck was performed using the standard protocol during bolus administration of intravenous contrast. Multiplanar CT image reconstructions and MIPs were obtained to evaluate the vascular anatomy. Carotid stenosis measurements (when applicable) are obtained utilizing NASCET criteria, using the distal internal carotid diameter as the denominator. CONTRAST:  166mL OMNIPAQUE IOHEXOL 350 MG/ML SOLN  COMPARISON:  MRI and CT head August 21, 2020.  CTA December 08, 2019. FINDINGS: CT HEAD FINDINGS Brain: Evolving central hypodensity with peripheral faint hyperdensity in the right basal ganglia. Suspected infarct along the frontal horn left lateral ventricle was better characterized on prior MRI. Similar adjacent rounded area of hyperdensity along the margin of the right lateral ventricle, likely hemorrhage. No progressive mass effect. Additional moderate patchy white matter hypoattenuation, most likely related to chronic microvascular ischemic disease. No evidence of interval acute large vascular territory infarct. Moderate atrophy with ex vacuo ventricular dilation. No hydrocephalus. No extra-axial fluid collection. Vascular: See below. Skull: No acute fracture. Sinuses: Mucosal thickening of the posteromedial left sphenoid sinus and atretic left maxillary sinus. No acute orbital findings. Other: No mastoid effusions. Review of the MIP images confirms the above findings CTA NECK FINDINGS Aortic arch: Right carotid system: No evidence of dissection, stenosis (50% or greater) or occlusion. Predominantly calcific atherosclerosis at the carotid bifurcation without greater than 50% stenosis. Left carotid system: The left common carotid origin is not imaged. No visible significant (greater than 50%) stenosis of the common carotid artery or internal  carotid artery with calcific atherosclerosis at the carotid bifurcation. Mild narrowing of the ICA at the skull base, similar to prior. Vertebral arteries: Severe stenosis of bilateral vertebral arteries at their origins. Otherwise, vertebral arteries are patent. Mildly left dominant. Skeleton: Moderate degenerative disease at C5-C6 and C6-C7. REmote compression fracture in the incidentally imaged thoracic spine, similar to prior. Other neck: No acute abnormality. Similar 13 mm hypodense nodule within the thyroid gland which does not require further imaging follow-up per  current guidelines. Upper chest: Biapical pleuroparenchymal scarring. Otherwise, visualized lung apices are clear. Review of the MIP images confirms the above findings CTA HEAD FINDINGS Anterior circulation: No large vessel occlusion or proximal hemodynamically significant stenosis. Predominately calcific bilateral ICA atherosclerosis without evidence of greater than 50% narrowing. No aneurysm identified. No evidence of a vascular malformation in the region of the right basal ganglia hemorrhage. Posterior circulation: Bilateral intradural vertebral arteries, basilar artery and PCAs are patent without evidence of hemodynamically significant stenosis. Right PCA is supplied by a small right P1 PCA and right posterior communicating artery, similar to prior. No aneurysm identified. Venous sinuses: As permitted by contrast timing, patent. Review of the MIP images confirms the above findings IMPRESSION: CT head: 1. Similar findings of suspected evolving hemorrhagic infarct in the right basal ganglia, better characterized on recent MRI. No progressive mass effect or evidence of new hemorrhage. 2. Moderate chronic microvascular ischemic disease and atrophy. CTA Head: 1. No large vessel occlusion or proximal hemodynamically significant stenosis intracranially. 2. No evidence of a vascular malformation in the region of the right basal ganglia hemorrhage. CTA Neck: 1. Similar severe bilateral vertebral artery origin stenosis. 2. Bilateral carotid bifurcation atherosclerosis without greater than 50% narrowing. Electronically Signed   By: Margaretha Sheffield MD   On: 08/22/2020 17:57   DG Chest 2 View  Result Date: 08/21/2020 CLINICAL DATA:  Chest pain. Multiple falls this week. EXAM: CHEST - 2 VIEW COMPARISON:  CTA chest, abdomen, and pelvis dated Jul 19, 2020. FINDINGS: The heart size and mediastinal contours are within normal limits. Normal pulmonary vascularity. Small right greather than left pleural effusions with  associated right greater than left lower lobe atelectasis. No pneumothorax. No acute osseous abnormality. IMPRESSION: Right greater than left pleural effusions and bibasilar atelectasis. Electronically Signed   By: Titus Dubin M.D.   On: 08/21/2020 19:53   CT Head Wo Contrast  Result Date: 08/21/2020 CLINICAL DATA:  Multiple fall EXAM: CT HEAD WITHOUT CONTRAST TECHNIQUE: Contiguous axial images were obtained from the base of the skull through the vertex without intravenous contrast. COMPARISON:  CT brain 07/19/2020, MRI 12/08/2019 FINDINGS: Brain: Negative for intracranial mass. Moderate atrophy and chronic small vessel ischemic change of the white matter. Hypodensity in the right basal ganglia with faint internal hyperdensity, contiguous with mild hyperdensity adherent to the lateral wall of the right anterior ventricle, series 2, image 22. Stable ventricle size. Vascular: No hyperdense vessel.  Carotid vascular calcification Skull: Normal. Negative for fracture or focal lesion. Sinuses/Orbits: No acute finding. Other: None IMPRESSION: 1. Increased hypodensity within the right basal ganglia which appears to be associated with faint hyperdensity, suggestive of subacute hemorrhage or infarct. There appears to be a small amount of thrombus adherent to the lateral wall of the right anterior ventricle. No mass effect. No midline shift. 2. Atrophy and chronic small vessel ischemic changes of the white matter Critical Value/emergent results were called by telephone at the time of interpretation on 08/21/2020 at 6:26 pm to provider Culver ,  who verbally acknowledged these results. Electronically Signed   By: Donavan Foil M.D.   On: 08/21/2020 18:26   CT ANGIO NECK W OR WO CONTRAST  Result Date: 08/22/2020 CLINICAL DATA:  Stroke/TIA. EXAM: CT ANGIOGRAPHY HEAD AND NECK TECHNIQUE: Multidetector CT imaging of the head and neck was performed using the standard protocol during bolus administration of  intravenous contrast. Multiplanar CT image reconstructions and MIPs were obtained to evaluate the vascular anatomy. Carotid stenosis measurements (when applicable) are obtained utilizing NASCET criteria, using the distal internal carotid diameter as the denominator. CONTRAST:  170mL OMNIPAQUE IOHEXOL 350 MG/ML SOLN COMPARISON:  MRI and CT head August 21, 2020.  CTA December 08, 2019. FINDINGS: CT HEAD FINDINGS Brain: Evolving central hypodensity with peripheral faint hyperdensity in the right basal ganglia. Suspected infarct along the frontal horn left lateral ventricle was better characterized on prior MRI. Similar adjacent rounded area of hyperdensity along the margin of the right lateral ventricle, likely hemorrhage. No progressive mass effect. Additional moderate patchy white matter hypoattenuation, most likely related to chronic microvascular ischemic disease. No evidence of interval acute large vascular territory infarct. Moderate atrophy with ex vacuo ventricular dilation. No hydrocephalus. No extra-axial fluid collection. Vascular: See below. Skull: No acute fracture. Sinuses: Mucosal thickening of the posteromedial left sphenoid sinus and atretic left maxillary sinus. No acute orbital findings. Other: No mastoid effusions. Review of the MIP images confirms the above findings CTA NECK FINDINGS Aortic arch: Right carotid system: No evidence of dissection, stenosis (50% or greater) or occlusion. Predominantly calcific atherosclerosis at the carotid bifurcation without greater than 50% stenosis. Left carotid system: The left common carotid origin is not imaged. No visible significant (greater than 50%) stenosis of the common carotid artery or internal carotid artery with calcific atherosclerosis at the carotid bifurcation. Mild narrowing of the ICA at the skull base, similar to prior. Vertebral arteries: Severe stenosis of bilateral vertebral arteries at their origins. Otherwise, vertebral arteries are patent.  Mildly left dominant. Skeleton: Moderate degenerative disease at C5-C6 and C6-C7. REmote compression fracture in the incidentally imaged thoracic spine, similar to prior. Other neck: No acute abnormality. Similar 13 mm hypodense nodule within the thyroid gland which does not require further imaging follow-up per current guidelines. Upper chest: Biapical pleuroparenchymal scarring. Otherwise, visualized lung apices are clear. Review of the MIP images confirms the above findings CTA HEAD FINDINGS Anterior circulation: No large vessel occlusion or proximal hemodynamically significant stenosis. Predominately calcific bilateral ICA atherosclerosis without evidence of greater than 50% narrowing. No aneurysm identified. No evidence of a vascular malformation in the region of the right basal ganglia hemorrhage. Posterior circulation: Bilateral intradural vertebral arteries, basilar artery and PCAs are patent without evidence of hemodynamically significant stenosis. Right PCA is supplied by a small right P1 PCA and right posterior communicating artery, similar to prior. No aneurysm identified. Venous sinuses: As permitted by contrast timing, patent. Review of the MIP images confirms the above findings IMPRESSION: CT head: 1. Similar findings of suspected evolving hemorrhagic infarct in the right basal ganglia, better characterized on recent MRI. No progressive mass effect or evidence of new hemorrhage. 2. Moderate chronic microvascular ischemic disease and atrophy. CTA Head: 1. No large vessel occlusion or proximal hemodynamically significant stenosis intracranially. 2. No evidence of a vascular malformation in the region of the right basal ganglia hemorrhage. CTA Neck: 1. Similar severe bilateral vertebral artery origin stenosis. 2. Bilateral carotid bifurcation atherosclerosis without greater than 50% narrowing. Electronically Signed   By: Margaretha Sheffield MD  On: 08/22/2020 17:57   MR BRAIN W WO CONTRAST  Result  Date: 08/21/2020 CLINICAL DATA:  Initial evaluation for acute stroke. EXAM: MRI HEAD WITHOUT AND WITH CONTRAST TECHNIQUE: Multiplanar, multiecho pulse sequences of the brain and surrounding structures were obtained without and with intravenous contrast. CONTRAST:  6.41mL GADAVIST GADOBUTROL 1 MMOL/ML IV SOLN COMPARISON:  Prior CT from earlier the same day as well as earlier studies. FINDINGS: Brain: Diffuse prominence of the CSF containing spaces compatible generalized cerebral atrophy. Patchy T2/FLAIR hyperintensity within the periventricular and deep white matter both cerebral hemispheres most consistent with chronic small vessel ischemic disease, moderate in nature. Abnormal T2/FLAIR signal abnormality seen involving the right basal ganglia, with involvement of the right caudate and lentiform nuclei. Associated irregular postcontrast enhancement. Scattered areas of internal susceptibility artifact present as well. Overall appearance is felt to be most consistent with an evolving subacute hemorrhagic ischemic infarct. 9 mm soft tissue density with associated susceptibility artifact positioned along the wall of the right lateral ventricle likely reflects evolving hemorrhage, also seen on prior CT (series 17, image 88). No significant regional mass effect. Additional subtle hazy diffusion abnormality with mild enhancement noted involving the periventricular white matter adjacent to the frontal horn of the left lateral ventricle (series 5, image 73). Finding also suspicious for evolving subacute small vessel ischemia. No other diffusion abnormality to suggest acute or subacute ischemia. Gray-white matter differentiation otherwise maintained. No other evidence for acute or chronic intracranial hemorrhage. No mass lesion, mass effect, or midline shift. Mild ventricular prominence related to global parenchymal volume loss without hydrocephalus. No extra-axial fluid collection. Pituitary gland suprasellar region within  normal limits. Midline structures intact. No other abnormal enhancement. Vascular: Major intracranial vascular flow voids are maintained. Skull and upper cervical spine: Craniocervical junction within normal limits. Bone marrow signal intensity normal. Small lipoma noted at the left frontal scalp. Sinuses/Orbits: Globes and orbital soft tissues demonstrate no acute finding. Patient status post bilateral ocular lens replacement. Scattered mucosal thickening noted within the ethmoidal air cells as well as the left sphenoid and maxillary sinuses. No mastoid effusion. Inner ear structures grossly normal. Other: None. IMPRESSION: 1. Findings most consistent with an evolving subacute hemorrhagic right basal ganglia infarct as above. 9 mm soft tissue density along the lateral wall of the right lateral ventricle consistent with associated hemorrhage, corresponding with abnormality on prior CT. While these findings are favored to be vascular in ischemic in nature, a short interval follow-up MRI to ensure these changes resolve is recommended. 2. Additional subtle hazy diffusion abnormality involving the periventricular white matter adjacent to the frontal horn of the left lateral ventricle, also suspicious for evolving subacute small vessel ischemia. 3. Underlying age-related cerebral atrophy with chronic microvascular ischemic disease. Electronically Signed   By: Jeannine Boga M.D.   On: 08/21/2020 22:40   ECHOCARDIOGRAM COMPLETE  Result Date: 08/22/2020    ECHOCARDIOGRAM REPORT   Patient Name:   Charles Randall. Date of Exam: 08/22/2020 Medical Rec #:  [id number]         Height:       66.0 in Accession #:    [id number]        Weight:       140.0 lb Date of Birth:  06-15-1927          BSA:          1.719 m Patient Age:    87 years          BP:  120/83 mmHg Patient Gender: M                 HR:           84 bpm. Exam Location:  Inpatient Procedure: 2D Echo, Cardiac Doppler and Color Doppler Indications:     Stroke  History:        Patient has prior history of Echocardiogram examinations, most                 recent 02/16/2018. Aortic Valve Disease; Risk                 Factors:Dyslipidemia and Former Smoker. CKD.  Sonographer:    Clayton Lefort RDCS (AE) Referring Phys: [place]  1. Left ventricular ejection fraction, by estimation, is 55 to 60%. The left ventricle has normal function. The left ventricle has no regional wall motion abnormalities. There is mild asymmetric left ventricular hypertrophy of the basal-septal segment. Left ventricular diastolic parameters are indeterminate.  2. Right ventricular systolic function is normal. The right ventricular size is normal. There is normal pulmonary artery systolic pressure. The estimated right ventricular systolic pressure is 54.0 mmHg.  3. The mitral valve is grossly normal. Mild mitral valve regurgitation. No evidence of mitral stenosis.  4. The aortic valve is tricuspid. There is moderate calcification of the aortic valve. There is moderate thickening of the aortic valve. Aortic valve regurgitation is mild.  5. Aortic dilatation noted. There is mild dilatation of the aortic root, measuring 42 mm.  6. The inferior vena cava is normal in size with greater than 50% respiratory variability, suggesting right atrial pressure of 3 mmHg. Conclusion(s)/Recommendation(s): No intracardiac source of embolism detected on this transthoracic study. A transesophageal echocardiogram is recommended to exclude cardiac source of embolism if clinically indicated. FINDINGS  Left Ventricle: Left ventricular ejection fraction, by estimation, is 55 to 60%. The left ventricle has normal function. The left ventricle has no regional wall motion abnormalities. The left ventricular internal cavity size was normal in size. There is  mild asymmetric left ventricular hypertrophy of the basal-septal segment. Left ventricular diastolic parameters are indeterminate. Right Ventricle:  The right ventricular size is normal. No increase in right ventricular wall thickness. Right ventricular systolic function is normal. There is normal pulmonary artery systolic pressure. The tricuspid regurgitant velocity is 2.23 m/s, and  with an assumed right atrial pressure of 3 mmHg, the estimated right ventricular systolic pressure is 08.6 mmHg. Left Atrium: Left atrial size was normal in size. Right Atrium: Right atrial size was normal in size. Pericardium: Trivial pericardial effusion is present. Mitral Valve: The mitral valve is grossly normal. Mild mitral valve regurgitation. No evidence of mitral valve stenosis. Tricuspid Valve: The tricuspid valve is grossly normal. Tricuspid valve regurgitation is not demonstrated. No evidence of tricuspid stenosis. Aortic Valve: The aortic valve is tricuspid. There is moderate calcification of the aortic valve. There is moderate thickening of the aortic valve. Aortic valve regurgitation is mild. Aortic regurgitation PHT measures 528 msec. Aortic valve mean gradient  measures 5.5 mmHg. Aortic valve peak gradient measures 11.1 mmHg. Aortic valve area, by VTI measures 2.25 cm. Pulmonic Valve: The pulmonic valve was grossly normal. Pulmonic valve regurgitation is not visualized. No evidence of pulmonic stenosis. Aorta: Aortic dilatation noted. There is mild dilatation of the aortic root, measuring 42 mm. Venous: The inferior vena cava is normal in size with greater than 50% respiratory variability, suggesting right atrial pressure of 3 mmHg. IAS/Shunts: The  atrial septum is grossly normal.  LEFT VENTRICLE PLAX 2D LVIDd:         4.00 cm LVIDs:         2.70 cm LV PW:         1.20 cm LV IVS:        1.10 cm LVOT diam:     1.80 cm LV SV:         72 LV SV Index:   42 LVOT Area:     2.54 cm  RIGHT VENTRICLE             IVC RV Basal diam:  3.20 cm     IVC diam: 1.20 cm RV S prime:     16.30 cm/s TAPSE (M-mode): 2.0 cm LEFT ATRIUM           Index       RIGHT ATRIUM           Index  LA diam:      2.30 cm 1.34 cm/m  RA Area:     14.70 cm LA Vol (A2C): 30.4 ml 17.69 ml/m RA Volume:   33.70 ml  19.61 ml/m LA Vol (A4C): 30.8 ml 17.92 ml/m  AORTIC VALVE AV Area (Vmax):    2.48 cm AV Area (Vmean):   2.45 cm AV Area (VTI):     2.25 cm AV Vmax:           166.25 cm/s AV Vmean:          109.000 cm/s AV VTI:            0.322 m AV Peak Grad:      11.1 mmHg AV Mean Grad:      5.5 mmHg LVOT Vmax:         162.00 cm/s LVOT Vmean:        105.000 cm/s LVOT VTI:          0.284 m LVOT/AV VTI ratio: 0.88 AI PHT:            528 msec  AORTA Ao Root diam: 4.20 cm TRICUSPID VALVE TR Peak grad:   19.9 mmHg TR Vmax:        223.00 cm/s  SHUNTS Systemic VTI:  0.28 m Systemic Diam: 1.80 cm Eleonore Chiquito MD Electronically signed by Eleonore Chiquito MD Signature Date/Time: 08/22/2020/1:23:54 PM    Final       Subjective: No new complaints today  Discharge Exam: Vitals:   08/24/20 0411 08/24/20 1528 08/24/20 2000 08/25/20 0400  BP: (!) 171/84 127/69    Pulse: 86 77    Resp: 20 20    Temp: 98.3 F (36.8 C) 97.6 F (36.4 C) 99.4 F (37.4 C) 98.1 F (36.7 C)  TempSrc: Oral Oral Oral Oral  SpO2: 94% 94% 100%     General: Pt is alert, awake, not in acute distress Cardiovascular: RRR, S1/S2 +, no rubs, no gallops Respiratory: CTA bilaterally, no wheezing, no rhonchi Abdominal: Soft, NT, ND, bowel sounds + Extremities: no edema, no cyanosis    The results of significant diagnostics from this hospitalization (including imaging, microbiology, ancillary and laboratory) are listed below for reference.     Microbiology: Recent Results (from the past 240 hour(s))  SARS CORONAVIRUS 2 (TAT 6-24 HRS) Nasopharyngeal Nasopharyngeal Swab     Status: None   Collection Time: 08/21/20  6:40 PM   Specimen: Nasopharyngeal Swab  Result Value Ref Range Status   SARS Coronavirus 2 NEGATIVE NEGATIVE Final    Comment: (  NOTE) SARS-CoV-2 target nucleic acids are NOT DETECTED.  The SARS-CoV-2 RNA is generally  detectable in upper and lower respiratory specimens during the acute phase of infection. Negative results do not preclude SARS-CoV-2 infection, do not rule out co-infections with other pathogens, and should not be used as the sole basis for treatment or other patient management decisions. Negative results must be combined with clinical observations, patient history, and epidemiological information. The expected result is Negative.  Fact Sheet for Patients: SugarRoll.be  Fact Sheet for Healthcare Providers: https://www.woods-mathews.com/  This test is not yet approved or cleared by the [place] FDA and  has been authorized for detection and/or diagnosis of SARS-CoV-2 by FDA under an Emergency Use Authorization (EUA). This EUA will remain  in effect (meaning this test can be used) for the duration of the COVID-19 declaration under Se ction 564(b)(1) of the Act, 21 U.S.C. section 360bbb-3(b)(1), unless the authorization is terminated or revoked sooner.  Performed at [facility], [place] [address], [place], [place] [address]   SARS CORONAVIRUS 2 (TAT 6-24 HRS) Nasopharyngeal Nasopharyngeal Swab     Status: None   Collection Time: 08/24/20  6:08 PM   Specimen: Nasopharyngeal Swab  Result Value Ref Range Status   SARS Coronavirus 2 NEGATIVE NEGATIVE Final    Comment: (NOTE) SARS-CoV-2 target nucleic acids are NOT DETECTED.  The SARS-CoV-2 RNA is generally detectable in upper and lower respiratory specimens during the acute phase of infection. Negative results do not preclude SARS-CoV-2 infection, do not rule out co-infections with other pathogens, and should not be used as the sole basis for treatment or other patient management decisions. Negative results must be combined with clinical observations, patient history, and epidemiological information. The expected result is Negative.  Fact Sheet for  Patients: SugarRoll.be  Fact Sheet for Healthcare Providers: https://www.woods-mathews.com/  This test is not yet approved or cleared by the [place] FDA and  has been authorized for detection and/or diagnosis of SARS-CoV-2 by FDA under an Emergency Use Authorization (EUA). This EUA will remain  in effect (meaning this test can be used) for the duration of the COVID-19 declaration under Se ction 564(b)(1) of the Act, 21 U.S.C. section 360bbb-3(b)(1), unless the authorization is terminated or revoked sooner.  Performed at [facility], [place] [address], [place], [place] [address]      Labs: BNP (last 3 results) No results for input(s): BNP in the last 8760 hours. Basic Metabolic Panel: Recent Labs  Lab 08/21/20 1630 08/23/20 0207 08/24/20 0228  NA 133* 133* 132*  K 3.9 3.4* 4.0  CL 97* 97* 98  CO2 28 26 22   GLUCOSE 158* 103* 102*  BUN 36* 31* 31*  CREATININE 1.41* 1.47* 1.50*  CALCIUM 9.1 8.9 9.0  MG  --   --  2.1   Liver Function Tests: Recent Labs  Lab 08/21/20 1645  AST 22  ALT 18  ALKPHOS 75  BILITOT 0.4  PROT 6.5  ALBUMIN 2.9*   No results for input(s): LIPASE, AMYLASE in the last 168 hours. No results for input(s): AMMONIA in the last 168 hours. CBC: Recent Labs  Lab 08/21/20 1630 08/23/20 0207  WBC 9.2 8.2  HGB 11.0* 10.4*  HCT 33.2* 31.3*  MCV 92.2 90.5  PLT 350 347   Cardiac Enzymes: No results for input(s): CKTOTAL, CKMB, CKMBINDEX, TROPONINI in the last 168 hours. BNP: Invalid input(s): POCBNP CBG: Recent Labs  Lab 08/21/20 1637  GLUCAP 163*   D-Dimer No results for  input(s): DDIMER in the last 72 hours. Hgb A1c Recent Labs    08/22/20 1458  HGBA1C 5.5   Lipid Profile Recent Labs    08/23/20 0207  CHOL 131  HDL 38*  LDLCALC 77  TRIG 80  CHOLHDL 3.4   Thyroid function studies Recent Labs    08/22/20 1458  TSH 4.153   Anemia work up Recent Labs    08/22/20 1458   VITAMINB12 1,518*   Urinalysis    Component Value Date/Time   COLORURINE YELLOW 08/21/2020 1630   APPEARANCEUR HAZY (A) 08/21/2020 1630   LABSPEC 1.018 08/21/2020 1630   PHURINE 5.0 08/21/2020 1630   GLUCOSEU NEGATIVE 08/21/2020 1630   HGBUR NEGATIVE 08/21/2020 1630   BILIRUBINUR NEGATIVE 08/21/2020 1630   KETONESUR NEGATIVE 08/21/2020 1630   PROTEINUR 30 (A) 08/21/2020 1630   UROBILINOGEN 0.2 10/13/2010 1103   NITRITE NEGATIVE 08/21/2020 1630   LEUKOCYTESUR NEGATIVE 08/21/2020 1630   Sepsis Labs Invalid input(s): PROCALCITONIN,  WBC,  LACTICIDVEN Microbiology Recent Results (from the past 240 hour(s))  SARS CORONAVIRUS 2 (TAT 6-24 HRS) Nasopharyngeal Nasopharyngeal Swab     Status: None   Collection Time: 08/21/20  6:40 PM   Specimen: Nasopharyngeal Swab  Result Value Ref Range Status   SARS Coronavirus 2 NEGATIVE NEGATIVE Final    Comment: (NOTE) SARS-CoV-2 target nucleic acids are NOT DETECTED.  The SARS-CoV-2 RNA is generally detectable in upper and lower respiratory specimens during the acute phase of infection. Negative results do not preclude SARS-CoV-2 infection, do not rule out co-infections with other pathogens, and should not be used as the sole basis for treatment or other patient management decisions. Negative results must be combined with clinical observations, patient history, and epidemiological information. The expected result is Negative.  Fact Sheet for Patients: SugarRoll.be  Fact Sheet for Healthcare Providers: https://www.woods-mathews.com/  This test is not yet approved or cleared by the [place] FDA and  has been authorized for detection and/or diagnosis of SARS-CoV-2 by FDA under an Emergency Use Authorization (EUA). This EUA will remain  in effect (meaning this test can be used) for the duration of the COVID-19 declaration under Se ction 564(b)(1) of the Act, 21 U.S.C. section 360bbb-3(b)(1),  unless the authorization is terminated or revoked sooner.  Performed at [facility], [place] [address], [place], [place] [address]   SARS CORONAVIRUS 2 (TAT 6-24 HRS) Nasopharyngeal Nasopharyngeal Swab     Status: None   Collection Time: 08/24/20  6:08 PM   Specimen: Nasopharyngeal Swab  Result Value Ref Range Status   SARS Coronavirus 2 NEGATIVE NEGATIVE Final    Comment: (NOTE) SARS-CoV-2 target nucleic acids are NOT DETECTED.  The SARS-CoV-2 RNA is generally detectable in upper and lower respiratory specimens during the acute phase of infection. Negative results do not preclude SARS-CoV-2 infection, do not rule out co-infections with other pathogens, and should not be used as the sole basis for treatment or other patient management decisions. Negative results must be combined with clinical observations, patient history, and epidemiological information. The expected result is Negative.  Fact Sheet for Patients: SugarRoll.be  Fact Sheet for Healthcare Providers: https://www.woods-mathews.com/  This test is not yet approved or cleared by the [place] FDA and  has been authorized for detection and/or diagnosis of SARS-CoV-2 by FDA under an Emergency Use Authorization (EUA). This EUA will remain  in effect (meaning this test can be used) for the duration of the COVID-19 declaration under Se ction 564(b)(1) of the Act, 21 U.S.C.  section 360bbb-3(b)(1), unless the authorization is terminated or revoked sooner.  Performed at [facility], [place] [address], [place], [place] [address]      Time coordinating discharge: 37mins  SIGNED:   Kathie Dike, MD  [facility] 08/25/2020, 11:30 AM   If 7PM-7AM, please contact night-coverage [email or web address]

## 2020-08-25 NOTE — Progress Notes (Signed)
Physical Therapy Treatment Patient Details Name: Charles Randall. MRN: [id number] DOB: 05-Apr-1927 Today's Date: 08/25/2020    History of Present Illness 87 y.o. 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   Pt presents to ED with frequent falls onset over the past 1.5 weeks.  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    PT Comments    Patient alert and cooperative. After initiating LE exercises in supine, pt reporting he needed to sit on BSC. Assisted OOB with +2 max assist (pivoting without RW). Patient assisted to chair with same amount of assist. Stood from chair with RW with +2 mod assist and able to pivot to [facility] (pt stated needed again) with mod assist. Overall, pt doing better with use of RW and his shoes, but still requires 2 person mod assist.     Follow Up Recommendations  SNF (denied CIR)     Equipment Recommendations  Other (comment) (TBA at next venue)    Recommendations for Other Services       Precautions / Restrictions Precautions Precautions: Fall Precaution Comments: reports recent falls he would "just fall over" in any direction Restrictions Weight Bearing Restrictions: No Other Position/Activity Restrictions: complain of R quadrant discomfort - having hiccups.    Mobility  Bed Mobility Overal bed mobility: Needs Assistance Bed Mobility: Rolling;Sidelying to Sit Rolling: Mod assist Sidelying to sit: Max assist;HOB elevated       General bed mobility comments: mod assist to scoot hips out to EOB in sitting    Transfers Overall transfer level: Needs assistance Equipment used: Rolling walker (2 wheeled);2 person hand held assist Transfers: Sit to/from [facility] Sit to Stand: +2 physical assistance;Mod assist Stand pivot transfers: Max assist;+2 physical assistance        General transfer comment: pivoted on/off BSC initially without RW and 2 person assist; 2nd transfer on/off BSC with RW and pt able to perform with mod assist +2  Ambulation/Gait             General Gait Details: unable at this time; pivotal steps only with +2 max assist   Stairs             Wheelchair Mobility    Modified Rankin (Stroke Patients Only)       Balance Overall balance assessment: Needs assistance Sitting-balance support: Feet supported Sitting balance-Leahy Scale: Poor   Postural control: Posterior lean;Right lateral lean Standing balance support: Bilateral upper extremity supported Standing balance-Leahy Scale: Poor Standing balance comment: bil UE support via therapists's arms                            Cognition Arousal/Alertness: Awake/alert Behavior During Therapy: Flat affect Overall Cognitive Status: No family/caregiver present to determine baseline cognitive functioning                                 General Comments: following commands; oriented      Exercises General Exercises - Lower Extremity Ankle Circles/Pumps: AROM;Both;10 reps;Seated Heel Slides: AROM;Left;5 reps (resisted extension)    General Comments General comments (skin integrity, edema, etc.): pt reporting need to use BSC and again after assisted off BSC      Pertinent Vitals/Pain Pain Assessment: No/denies pain    Home Living  Prior Function            PT Goals (current goals can now be found in the care plan section) Acute Rehab PT Goals Patient Stated Goal: Would like to go home, but needs to be able to walk without help Time For Goal Achievement: 09/07/20 Potential to Achieve Goals: Good Progress towards PT goals: Progressing toward goals    Frequency    Min 3X/week      PT Plan Discharge plan needs to be updated    Co-evaluation              AM-PAC PT "6 Clicks" Mobility    Outcome Measure  Help needed turning from your back to your side while in a flat bed without using bedrails?: A Lot Help needed moving from lying on your back to sitting on the side of a flat bed without using bedrails?: A Lot Help needed moving to and from a bed to a chair (including a wheelchair)?: Total Help needed standing up from a chair using your arms (e.g., wheelchair or bedside chair)?: Total Help needed to walk in hospital room?: Total Help needed climbing 3-5 steps with a railing? : Total 6 Click Score: 8    End of Session Equipment Utilized During Treatment: Gait belt Activity Tolerance: Patient tolerated treatment well Patient left: in chair;with call bell/phone within reach;with chair alarm set Nurse Communication: Mobility status PT Visit Diagnosis: Repeated falls (R29.6);Muscle weakness (generalized) (M62.81);Difficulty in walking, not elsewhere classified (R26.2)     Time: 9163-8466 PT Time Calculation (min) (ACUTE ONLY): 41 min  Charges:  $Therapeutic Activity: 38-52 mins                      Arby Barrette, PT Pager [phone]    Charles Randall 08/25/2020, 11:44 AM

## 2020-08-26 NOTE — TOC Transition Note (Signed)
Transition of Care [facility]) - CM/SW Discharge Note   Patient Details  Name: Izyk Marty. MRN: [id number] Date of Birth: October 01, 1927  [facility] CM/SW Contact:  Geralynn Ochs, LCSW Phone Number: 08/26/2020, 10:31 AM   Clinical Narrative:   Nurse to call report to [phone].  Please call daughter when Corey Harold picks up the patient.     Final next level of care: Skilled Nursing Facility Barriers to Discharge: Barriers Resolved   Patient Goals and CMS Choice Patient states their goals for this hospitalization and ongoing recovery are:: Pt states he is agreeable to SNF placement before returning home. CMS Medicare.gov Compare Post Acute Care list provided to:: Patient Choice offered to / list presented to : Patient  Discharge Placement              Patient chooses bed at: [facility] Patient to be transferred to facility by: [facility] Name of family member notified: Daughter Patient and family notified of of transfer: 08/26/20  Discharge Plan and Services     Post Acute Care Choice: [facility]                               Social Determinants of Health (SDOH) Interventions     Readmission Risk Interventions No flowsheet data found.

## 2020-08-26 NOTE — Discharge Summary (Signed)
Physician Discharge Summary  Charles Randall. 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  CT scan of the head revealed subacute right basal ganglia hemorrhagic infarct.  Patient was admitted to the hospital for further work-up.  Seen by neurology, started on aspirin since stroke was subacute.  Other work-up was unrevealing.  Seen by PT/OT. Plans are to discharge to SNF.  Unable to leave to facility 28th due to logistical/staffing issues at facility. Otherwise stable for discharge.  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  Continue on Zetia -A1c 5.5 -2D echocardiogram with normal EF, without any signs of embolus -Blood pressure goal less than 160 -Seen by PT/OT with recommendations for CIR vs. SNF -Family has elected SNF placement, He will discharge to [facility] place -follow up with neurology in 4 weeks   Seizure disorder -Continue on Keppra and carbamazepine   Chronic kidney disease stage  IIIa -Creatinine is currently at baseline -Continue to monitor   Hypertension -Chronically on hydrochlorothiazide -Blood pressure stable   Left kidney mass -Noted on CT scan from 06/2020 -Can consider nonemergent MRI abdomen if family desires further work-up   Thoracic aortic aneurysm -Noted on CT scan from 06/2020, measured at 4.7 cm -Can consider further surveillance as an outpatient, although I would not anticipate that he be a candidate for any surgical intervention due to his advanced age   Intractable hiccups -Patient reports history of intractable hiccups for the past 2 to 3 weeks -Continue on Protonix -improved with prn thorazine  Discharge Instructions  Discharge Instructions     Ambulatory referral to Neurology   Complete by: As directed    Follow up with Dr. Leta Randall at [facility] in 4-6 weeks.  Patient is Charles Randall patient.  Thanks.   Diet - low sodium heart healthy   Complete by: As directed    Increase activity slowly   Complete by: As directed       Allergies as of 08/26/2020   No Known Allergies      Medication List     STOP taking these medications    imiquimod 5 % cream Commonly known as: ALDARA   lisinopril-hydrochlorothiazide 20-25 MG tablet Commonly known as: ZESTORETIC   Magnesium 250 MG Tabs   oxyCODONE 5 MG immediate release tablet Commonly known as: Oxy IR/ROXICODONE   tacrolimus 0.1 % ointment Commonly known as: PROTOPIC       TAKE these medications    aspirin EC 81 MG tablet Take 1 tablet (81 mg total) by mouth every other day.   carbamazepine 100 MG 12 hr capsule Commonly known as: CARBATROL Take 1 capsule (  100 mg total) by mouth 2 (two) times daily.   Dupixent 300 MG/2ML Sopn Generic drug: Dupilumab Inject into the skin. Every 2 weeks,injection   ezetimibe 10 MG tablet Commonly known as: ZETIA TAKE 1 TABLET BY MOUTH ONCE DAILY. THIS IS TO REPLACE THE ATORVASTATIN What changed: See the new instructions.    hydrochlorothiazide 12.5 MG tablet Commonly known as: HYDRODIURIL Take 6.25 mg by mouth daily.   levETIRAcetam 750 MG tablet Commonly known as: KEPPRA Take 2 tablets (1,500 mg total) by mouth 2 (two) times daily.   magnesium oxide 400 MG tablet Commonly known as: MAG-OX Take 400 mg by mouth daily.   pantoprazole 40 MG tablet Commonly known as: PROTONIX Take 1 tablet (40 mg total) by mouth daily.   triamcinolone ointment 0.1 % Commonly known as: KENALOG Apply 1 application topically 2 (two) times daily as needed.   vitamin B-12 1000 MCG tablet Commonly known as: CYANOCOBALAMIN Take 1,000 mcg by mouth daily.   vitamin C 1000 MG tablet Take 1,000 mg by mouth daily.   Vitamin D-3 25 MCG (1000 UT) Caps Take 1,000 Units by mouth daily.   vitamin E 180 MG (400 UNITS) capsule Take 400 Units by mouth daily.        Contact information for follow-up providers     Penumalli, Earlean Polka, MD. Schedule an appointment as soon as possible for a visit in 1 month(s).   Specialties: Neurology, Radiology Contact information: [address] [place] [address] [phone]              Contact information for after-discharge care     Destination     [facility]erred [facility] .   Service: Skilled Nursing Contact information: [place] [phone]                    No Known Allergies  Consultations: neurology   Procedures/Studies: CT ANGIO HEAD W OR WO CONTRAST  Result Date: 08/22/2020 CLINICAL DATA:  Stroke/TIA. EXAM: CT ANGIOGRAPHY HEAD AND NECK TECHNIQUE: Multidetector CT imaging of the head and neck was performed using the standard protocol during bolus administration of intravenous contrast. Multiplanar CT image reconstructions and MIPs were obtained to evaluate the vascular anatomy. Carotid stenosis measurements (when applicable) are obtained utilizing NASCET criteria, using the distal internal  carotid diameter as the denominator. CONTRAST:  122mL OMNIPAQUE IOHEXOL 350 MG/ML SOLN COMPARISON:  MRI and CT head August 21, 2020.  CTA December 08, 2019. FINDINGS: CT HEAD FINDINGS Brain: Evolving central hypodensity with peripheral faint hyperdensity in the right basal ganglia. Suspected infarct along the frontal horn left lateral ventricle was better characterized on prior MRI. Similar adjacent rounded area of hyperdensity along the margin of the right lateral ventricle, likely hemorrhage. No progressive mass effect. Additional moderate patchy white matter hypoattenuation, most likely related to chronic microvascular ischemic disease. No evidence of interval acute large vascular territory infarct. Moderate atrophy with ex vacuo ventricular dilation. No hydrocephalus. No extra-axial fluid collection. Vascular: See below. Skull: No acute fracture. Sinuses: Mucosal thickening of the posteromedial left sphenoid sinus and atretic left maxillary sinus. No acute orbital findings. Other: No mastoid effusions. Review of the MIP images confirms the above findings CTA NECK FINDINGS Aortic arch: Right carotid system: No evidence of dissection, stenosis (50% or greater) or occlusion. Predominantly calcific atherosclerosis at the carotid bifurcation without greater than 50% stenosis. Left carotid system: The left common carotid origin is not imaged. No visible significant (  greater than 50%) stenosis of the common carotid artery or internal carotid artery with calcific atherosclerosis at the carotid bifurcation. Mild narrowing of the ICA at the skull base, similar to prior. Vertebral arteries: Severe stenosis of bilateral vertebral arteries at their origins. Otherwise, vertebral arteries are patent. Mildly left dominant. Skeleton: Moderate degenerative disease at C5-C6 and C6-C7. REmote compression fracture in the incidentally imaged thoracic spine, similar to prior. Other neck: No acute abnormality. Similar 13 mm hypodense  nodule within the thyroid gland which does not require further imaging follow-up per current guidelines. Upper chest: Biapical pleuroparenchymal scarring. Otherwise, visualized lung apices are clear. Review of the MIP images confirms the above findings CTA HEAD FINDINGS Anterior circulation: No large vessel occlusion or proximal hemodynamically significant stenosis. Predominately calcific bilateral ICA atherosclerosis without evidence of greater than 50% narrowing. No aneurysm identified. No evidence of a vascular malformation in the region of the right basal ganglia hemorrhage. Posterior circulation: Bilateral intradural vertebral arteries, basilar artery and PCAs are patent without evidence of hemodynamically significant stenosis. Right PCA is supplied by a small right P1 PCA and right posterior communicating artery, similar to prior. No aneurysm identified. Venous sinuses: As permitted by contrast timing, patent. Review of the MIP images confirms the above findings IMPRESSION: CT head: 1. Similar findings of suspected evolving hemorrhagic infarct in the right basal ganglia, better characterized on recent MRI. No progressive mass effect or evidence of new hemorrhage. 2. Moderate chronic microvascular ischemic disease and atrophy. CTA Head: 1. No large vessel occlusion or proximal hemodynamically significant stenosis intracranially. 2. No evidence of a vascular malformation in the region of the right basal ganglia hemorrhage. CTA Neck: 1. Similar severe bilateral vertebral artery origin stenosis. 2. Bilateral carotid bifurcation atherosclerosis without greater than 50% narrowing. Electronically Signed   By: Margaretha Sheffield MD   On: 08/22/2020 17:57   DG Chest 2 View  Result Date: 08/21/2020 CLINICAL DATA:  Chest pain. Multiple falls this week. EXAM: CHEST - 2 VIEW COMPARISON:  CTA chest, abdomen, and pelvis dated Jul 19, 2020. FINDINGS: The heart size and mediastinal contours are within normal limits. Normal  pulmonary vascularity. Small right greather than left pleural effusions with associated right greater than left lower lobe atelectasis. No pneumothorax. No acute osseous abnormality. IMPRESSION: Right greater than left pleural effusions and bibasilar atelectasis. Electronically Signed   By: Titus Dubin M.D.   On: 08/21/2020 19:53   CT Head Wo Contrast  Result Date: 08/21/2020 CLINICAL DATA:  Multiple fall EXAM: CT HEAD WITHOUT CONTRAST TECHNIQUE: Contiguous axial images were obtained from the base of the skull through the vertex without intravenous contrast. COMPARISON:  CT brain 07/19/2020, MRI 12/08/2019 FINDINGS: Brain: Negative for intracranial mass. Moderate atrophy and chronic small vessel ischemic change of the white matter. Hypodensity in the right basal ganglia with faint internal hyperdensity, contiguous with mild hyperdensity adherent to the lateral wall of the right anterior ventricle, series 2, image 22. Stable ventricle size. Vascular: No hyperdense vessel.  Carotid vascular calcification Skull: Normal. Negative for fracture or focal lesion. Sinuses/Orbits: No acute finding. Other: None IMPRESSION: 1. Increased hypodensity within the right basal ganglia which appears to be associated with faint hyperdensity, suggestive of subacute hemorrhage or infarct. There appears to be a small amount of thrombus adherent to the lateral wall of the right anterior ventricle. No mass effect. No midline shift. 2. Atrophy and chronic small vessel ischemic changes of the white matter Critical Value/emergent results were called by telephone at the time of  interpretation on 08/21/2020 at 6:26 pm to provider RACHEL LITTLE , who verbally acknowledged these results. Electronically Signed   By: Donavan Foil M.D.   On: 08/21/2020 18:26   CT ANGIO NECK W OR WO CONTRAST  Result Date: 08/22/2020 CLINICAL DATA:  Stroke/TIA. EXAM: CT ANGIOGRAPHY HEAD AND NECK TECHNIQUE: Multidetector CT imaging of the head and neck was  performed using the standard protocol during bolus administration of intravenous contrast. Multiplanar CT image reconstructions and MIPs were obtained to evaluate the vascular anatomy. Carotid stenosis measurements (when applicable) are obtained utilizing NASCET criteria, using the distal internal carotid diameter as the denominator. CONTRAST:  154mL OMNIPAQUE IOHEXOL 350 MG/ML SOLN COMPARISON:  MRI and CT head August 21, 2020.  CTA December 08, 2019. FINDINGS: CT HEAD FINDINGS Brain: Evolving central hypodensity with peripheral faint hyperdensity in the right basal ganglia. Suspected infarct along the frontal horn left lateral ventricle was better characterized on prior MRI. Similar adjacent rounded area of hyperdensity along the margin of the right lateral ventricle, likely hemorrhage. No progressive mass effect. Additional moderate patchy white matter hypoattenuation, most likely related to chronic microvascular ischemic disease. No evidence of interval acute large vascular territory infarct. Moderate atrophy with ex vacuo ventricular dilation. No hydrocephalus. No extra-axial fluid collection. Vascular: See below. Skull: No acute fracture. Sinuses: Mucosal thickening of the posteromedial left sphenoid sinus and atretic left maxillary sinus. No acute orbital findings. Other: No mastoid effusions. Review of the MIP images confirms the above findings CTA NECK FINDINGS Aortic arch: Right carotid system: No evidence of dissection, stenosis (50% or greater) or occlusion. Predominantly calcific atherosclerosis at the carotid bifurcation without greater than 50% stenosis. Left carotid system: The left common carotid origin is not imaged. No visible significant (greater than 50%) stenosis of the common carotid artery or internal carotid artery with calcific atherosclerosis at the carotid bifurcation. Mild narrowing of the ICA at the skull base, similar to prior. Vertebral arteries: Severe stenosis of bilateral vertebral  arteries at their origins. Otherwise, vertebral arteries are patent. Mildly left dominant. Skeleton: Moderate degenerative disease at C5-C6 and C6-C7. REmote compression fracture in the incidentally imaged thoracic spine, similar to prior. Other neck: No acute abnormality. Similar 13 mm hypodense nodule within the thyroid gland which does not require further imaging follow-up per current guidelines. Upper chest: Biapical pleuroparenchymal scarring. Otherwise, visualized lung apices are clear. Review of the MIP images confirms the above findings CTA HEAD FINDINGS Anterior circulation: No large vessel occlusion or proximal hemodynamically significant stenosis. Predominately calcific bilateral ICA atherosclerosis without evidence of greater than 50% narrowing. No aneurysm identified. No evidence of a vascular malformation in the region of the right basal ganglia hemorrhage. Posterior circulation: Bilateral intradural vertebral arteries, basilar artery and PCAs are patent without evidence of hemodynamically significant stenosis. Right PCA is supplied by a small right P1 PCA and right posterior communicating artery, similar to prior. No aneurysm identified. Venous sinuses: As permitted by contrast timing, patent. Review of the MIP images confirms the above findings IMPRESSION: CT head: 1. Similar findings of suspected evolving hemorrhagic infarct in the right basal ganglia, better characterized on recent MRI. No progressive mass effect or evidence of new hemorrhage. 2. Moderate chronic microvascular ischemic disease and atrophy. CTA Head: 1. No large vessel occlusion or proximal hemodynamically significant stenosis intracranially. 2. No evidence of a vascular malformation in the region of the right basal ganglia hemorrhage. CTA Neck: 1. Similar severe bilateral vertebral artery origin stenosis. 2. Bilateral carotid bifurcation atherosclerosis without greater than  50% narrowing. Electronically Signed   By: Margaretha Sheffield MD   On: 08/22/2020 17:57   MR BRAIN W WO CONTRAST  Result Date: 08/21/2020 CLINICAL DATA:  Initial evaluation for acute stroke. EXAM: MRI HEAD WITHOUT AND WITH CONTRAST TECHNIQUE: Multiplanar, multiecho pulse sequences of the brain and surrounding structures were obtained without and with intravenous contrast. CONTRAST:  6.9mL GADAVIST GADOBUTROL 1 MMOL/ML IV SOLN COMPARISON:  Prior CT from earlier the same day as well as earlier studies. FINDINGS: Brain: Diffuse prominence of the CSF containing spaces compatible generalized cerebral atrophy. Patchy T2/FLAIR hyperintensity within the periventricular and deep white matter both cerebral hemispheres most consistent with chronic small vessel ischemic disease, moderate in nature. Abnormal T2/FLAIR signal abnormality seen involving the right basal ganglia, with involvement of the right caudate and lentiform nuclei. Associated irregular postcontrast enhancement. Scattered areas of internal susceptibility artifact present as well. Overall appearance is felt to be most consistent with an evolving subacute hemorrhagic ischemic infarct. 9 mm soft tissue density with associated susceptibility artifact positioned along the wall of the right lateral ventricle likely reflects evolving hemorrhage, also seen on prior CT (series 17, image 88). No significant regional mass effect. Additional subtle hazy diffusion abnormality with mild enhancement noted involving the periventricular white matter adjacent to the frontal horn of the left lateral ventricle (series 5, image 73). Finding also suspicious for evolving subacute small vessel ischemia. No other diffusion abnormality to suggest acute or subacute ischemia. Gray-white matter differentiation otherwise maintained. No other evidence for acute or chronic intracranial hemorrhage. No mass lesion, mass effect, or midline shift. Mild ventricular prominence related to global parenchymal volume loss without hydrocephalus. No  extra-axial fluid collection. Pituitary gland suprasellar region within normal limits. Midline structures intact. No other abnormal enhancement. Vascular: Randall intracranial vascular flow voids are maintained. Skull and upper cervical spine: Craniocervical junction within normal limits. Bone marrow signal intensity normal. Small lipoma noted at the left frontal scalp. Sinuses/Orbits: Globes and orbital soft tissues demonstrate no acute finding. Patient status post bilateral ocular lens replacement. Scattered mucosal thickening noted within the ethmoidal air cells as well as the left sphenoid and maxillary sinuses. No mastoid effusion. Inner ear structures grossly normal. Other: None. IMPRESSION: 1. Findings most consistent with an evolving subacute hemorrhagic right basal ganglia infarct as above. 9 mm soft tissue density along the lateral wall of the right lateral ventricle consistent with associated hemorrhage, corresponding with abnormality on prior CT. While these findings are favored to be vascular in ischemic in nature, a short interval follow-up MRI to ensure these changes resolve is recommended. 2. Additional subtle hazy diffusion abnormality involving the periventricular white matter adjacent to the frontal horn of the left lateral ventricle, also suspicious for evolving subacute small vessel ischemia. 3. Underlying age-related cerebral atrophy with chronic microvascular ischemic disease. Electronically Signed   By: Jeannine Boga M.D.   On: 08/21/2020 22:40   ECHOCARDIOGRAM COMPLETE  Result Date: 08/22/2020    ECHOCARDIOGRAM REPORT   Patient Name:   Charles Randall. Date of Exam: 08/22/2020 Medical Rec #:  [id number]         Height:       66.0 in Accession #:    [id number]        Weight:       140.0 lb Date of Birth:  [birth year]          BSA:          1.719 m Patient Age:    5 years  BP:           120/83 mmHg Patient Gender: M                 HR:           84 bpm. Exam Location:  Inpatient  Procedure: 2D Echo, Cardiac Doppler and Color Doppler Indications:    Stroke  History:        Patient has prior history of Echocardiogram examinations, most                 recent 02/16/2018. Aortic Valve Disease; Risk                 Factors:Dyslipidemia and Former Smoker. CKD.  Sonographer:    Clayton Lefort RDCS (AE) Referring Phys: [place]  1. Left ventricular ejection fraction, by estimation, is 55 to 60%. The left ventricle has normal function. The left ventricle has no regional wall motion abnormalities. There is mild asymmetric left ventricular hypertrophy of the basal-septal segment. Left ventricular diastolic parameters are indeterminate.  2. Right ventricular systolic function is normal. The right ventricular size is normal. There is normal pulmonary artery systolic pressure. The estimated right ventricular systolic pressure is 64.4 mmHg.  3. The mitral valve is grossly normal. Mild mitral valve regurgitation. No evidence of mitral stenosis.  4. The aortic valve is tricuspid. There is moderate calcification of the aortic valve. There is moderate thickening of the aortic valve. Aortic valve regurgitation is mild.  5. Aortic dilatation noted. There is mild dilatation of the aortic root, measuring 42 mm.  6. The inferior vena cava is normal in size with greater than 50% respiratory variability, suggesting right atrial pressure of 3 mmHg. Conclusion(s)/Recommendation(s): No intracardiac source of embolism detected on this transthoracic study. A transesophageal echocardiogram is recommended to exclude cardiac source of embolism if clinically indicated. FINDINGS  Left Ventricle: Left ventricular ejection fraction, by estimation, is 55 to 60%. The left ventricle has normal function. The left ventricle has no regional wall motion abnormalities. The left ventricular internal cavity size was normal in size. There is  mild asymmetric left ventricular hypertrophy of the basal-septal segment. Left  ventricular diastolic parameters are indeterminate. Right Ventricle: The right ventricular size is normal. No increase in right ventricular wall thickness. Right ventricular systolic function is normal. There is normal pulmonary artery systolic pressure. The tricuspid regurgitant velocity is 2.23 m/s, and  with an assumed right atrial pressure of 3 mmHg, the estimated right ventricular systolic pressure is 03.4 mmHg. Left Atrium: Left atrial size was normal in size. Right Atrium: Right atrial size was normal in size. Pericardium: Trivial pericardial effusion is present. Mitral Valve: The mitral valve is grossly normal. Mild mitral valve regurgitation. No evidence of mitral valve stenosis. Tricuspid Valve: The tricuspid valve is grossly normal. Tricuspid valve regurgitation is not demonstrated. No evidence of tricuspid stenosis. Aortic Valve: The aortic valve is tricuspid. There is moderate calcification of the aortic valve. There is moderate thickening of the aortic valve. Aortic valve regurgitation is mild. Aortic regurgitation PHT measures 528 msec. Aortic valve mean gradient  measures 5.5 mmHg. Aortic valve peak gradient measures 11.1 mmHg. Aortic valve area, by VTI measures 2.25 cm. Pulmonic Valve: The pulmonic valve was grossly normal. Pulmonic valve regurgitation is not visualized. No evidence of pulmonic stenosis. Aorta: Aortic dilatation noted. There is mild dilatation of the aortic root, measuring 42 mm. Venous: The inferior vena cava is normal in size with greater than 50%  respiratory variability, suggesting right atrial pressure of 3 mmHg. IAS/Shunts: The atrial septum is grossly normal.  LEFT VENTRICLE PLAX 2D LVIDd:         4.00 cm LVIDs:         2.70 cm LV PW:         1.20 cm LV IVS:        1.10 cm LVOT diam:     1.80 cm LV SV:         72 LV SV Index:   42 LVOT Area:     2.54 cm  RIGHT VENTRICLE             IVC RV Basal diam:  3.20 cm     IVC diam: 1.20 cm RV S prime:     16.30 cm/s TAPSE (M-mode):  2.0 cm LEFT ATRIUM           Index       RIGHT ATRIUM           Index LA diam:      2.30 cm 1.34 cm/m  RA Area:     14.70 cm LA Vol (A2C): 30.4 ml 17.69 ml/m RA Volume:   33.70 ml  19.61 ml/m LA Vol (A4C): 30.8 ml 17.92 ml/m  AORTIC VALVE AV Area (Vmax):    2.48 cm AV Area (Vmean):   2.45 cm AV Area (VTI):     2.25 cm AV Vmax:           166.25 cm/s AV Vmean:          109.000 cm/s AV VTI:            0.322 m AV Peak Grad:      11.1 mmHg AV Mean Grad:      5.5 mmHg LVOT Vmax:         162.00 cm/s LVOT Vmean:        105.000 cm/s LVOT VTI:          0.284 m LVOT/AV VTI ratio: 0.88 AI PHT:            528 msec  AORTA Ao Root diam: 4.20 cm TRICUSPID VALVE TR Peak grad:   19.9 mmHg TR Vmax:        223.00 cm/s  SHUNTS Systemic VTI:  0.28 m Systemic Diam: 1.80 cm Eleonore Chiquito MD Electronically signed by Eleonore Chiquito MD Signature Date/Time: 08/22/2020/1:23:54 PM    Final       Subjective: No new complaints today  Discharge Exam: Vitals:   08/25/20 0400 08/25/20 1254 08/25/20 1936 08/26/20 0504  BP:  115/71 (!) 130/43 (!) 132/59  Pulse:   75   Resp:   18   Temp: 98.1 F (36.7 C)  98.2 F (36.8 C) 98 F (36.7 C)  TempSrc: Oral  Oral Oral  SpO2:   96% 95%    General: Pt is alert, awake, not in acute distress Cardiovascular: RRR, S1/S2 +, no rubs, no gallops Respiratory: CTA bilaterally, no wheezing, no rhonchi Abdominal: Soft, NT, ND, bowel sounds + Extremities: no edema, no cyanosis    The results of significant diagnostics from this hospitalization (including imaging, microbiology, ancillary and laboratory) are listed below for reference.     Microbiology: Recent Results (from the past 240 hour(s))  SARS CORONAVIRUS 2 (TAT 6-24 HRS) Nasopharyngeal Nasopharyngeal Swab     Status: None   Collection Time: 08/21/20  6:40 PM   Specimen: Nasopharyngeal Swab  Result Value Ref Range Status   SARS  Coronavirus 2 NEGATIVE NEGATIVE Final    Comment: (NOTE) SARS-CoV-2 target nucleic acids are  NOT DETECTED.  The SARS-CoV-2 RNA is generally detectable in upper and lower respiratory specimens during the acute phase of infection. Negative results do not preclude SARS-CoV-2 infection, do not rule out co-infections with other pathogens, and should not be used as the sole basis for treatment or other patient management decisions. Negative results must be combined with clinical observations, patient history, and epidemiological information. The expected result is Negative.  Fact Sheet for Patients: SugarRoll.be  Fact Sheet for Healthcare Providers: https://www.woods-mathews.com/  This test is not yet approved or cleared by the [place] FDA and  has been authorized for detection and/or diagnosis of SARS-CoV-2 by FDA under an Emergency Use Authorization (EUA). This EUA will remain  in effect (meaning this test can be used) for the duration of the COVID-19 declaration under Se ction 564(b)(1) of the Act, 21 U.S.C. section 360bbb-3(b)(1), unless the authorization is terminated or revoked sooner.  Performed at [facility], [place] [address] [place], [place] [address]   SARS CORONAVIRUS 2 (TAT 6-24 HRS) Nasopharyngeal Nasopharyngeal Swab     Status: None   Collection Time: 08/24/20  6:08 PM   Specimen: Nasopharyngeal Swab  Result Value Ref Range Status   SARS Coronavirus 2 NEGATIVE NEGATIVE Final    Comment: (NOTE) SARS-CoV-2 target nucleic acids are NOT DETECTED.  The SARS-CoV-2 RNA is generally detectable in upper and lower respiratory specimens during the acute phase of infection. Negative results do not preclude SARS-CoV-2 infection, do not rule out co-infections with other pathogens, and should not be used as the sole basis for treatment or other patient management decisions. Negative results must be combined with clinical observations, patient history, and epidemiological information. The expected result is  Negative.  Fact Sheet for Patients: SugarRoll.be  Fact Sheet for Healthcare Providers: https://www.woods-mathews.com/  This test is not yet approved or cleared by the [place] FDA and  has been authorized for detection and/or diagnosis of SARS-CoV-2 by FDA under an Emergency Use Authorization (EUA). This EUA will remain  in effect (meaning this test can be used) for the duration of the COVID-19 declaration under Se ction 564(b)(1) of the Act, 21 U.S.C. section 360bbb-3(b)(1), unless the authorization is terminated or revoked sooner.  Performed at [facility], [place] [address] [place],  [address]      Labs: BNP (last 3 results) No results for input(s): BNP in the last 8760 hours. Basic Metabolic Panel: Recent Labs  Lab 08/21/20 1630 08/23/20 0207 08/24/20 0228  NA 133* 133* 132*  K 3.9 3.4* 4.0  CL 97* 97* 98  CO2 28 26 22   GLUCOSE 158* 103* 102*  BUN 36* 31* 31*  CREATININE 1.41* 1.47* 1.50*  CALCIUM 9.1 8.9 9.0  MG  --   --  2.1    Liver Function Tests: Recent Labs  Lab 08/21/20 1645  AST 22  ALT 18  ALKPHOS 75  BILITOT 0.4  PROT 6.5  ALBUMIN 2.9*    No results for input(s): LIPASE, AMYLASE in the last 168 hours. No results for input(s): AMMONIA in the last 168 hours. CBC: Recent Labs  Lab 08/21/20 1630 08/23/20 0207  WBC 9.2 8.2  HGB 11.0* 10.4*  HCT 33.2* 31.3*  MCV 92.2 90.5  PLT 350 347    Cardiac Enzymes: No results for input(s): CKTOTAL, CKMB, CKMBINDEX, TROPONINI in the last 168 hours. BNP: Invalid input(s): POCBNP CBG: Recent Labs  Lab 08/21/20 1637  GLUCAP 163*    D-Dimer No results for input(s): DDIMER in the last 72 hours. Hgb A1c No results for input(s): HGBA1C in the last 72 hours.  Lipid Profile No results for input(s): CHOL, HDL, LDLCALC, TRIG, CHOLHDL, LDLDIRECT in the last 72 hours.  Thyroid function studies No results for input(s): TSH, T4TOTAL, T3FREE,  THYROIDAB in the last 72 hours.  Invalid input(s): FREET3  Anemia work up No results for input(s): VITAMINB12, FOLATE, FERRITIN, TIBC, IRON, RETICCTPCT in the last 72 hours.  Urinalysis    Component Value Date/Time   COLORURINE YELLOW 08/21/2020 1630   APPEARANCEUR HAZY (A) 08/21/2020 1630   LABSPEC 1.018 08/21/2020 1630   PHURINE 5.0 08/21/2020 1630   GLUCOSEU NEGATIVE 08/21/2020 1630   HGBUR NEGATIVE 08/21/2020 1630   BILIRUBINUR NEGATIVE 08/21/2020 1630   KETONESUR NEGATIVE 08/21/2020 1630   PROTEINUR 30 (A) 08/21/2020 1630   UROBILINOGEN 0.2 10/13/2010 1103   NITRITE NEGATIVE 08/21/2020 1630   LEUKOCYTESUR NEGATIVE 08/21/2020 1630   Sepsis Labs Invalid input(s): PROCALCITONIN,  WBC,  LACTICIDVEN Microbiology Recent Results (from the past 240 hour(s))  SARS CORONAVIRUS 2 (TAT 6-24 HRS) Nasopharyngeal Nasopharyngeal Swab     Status: None   Collection Time: 08/21/20  6:40 PM   Specimen: Nasopharyngeal Swab  Result Value Ref Range Status   SARS Coronavirus 2 NEGATIVE NEGATIVE Final    Comment: (NOTE) SARS-CoV-2 target nucleic acids are NOT DETECTED.  The SARS-CoV-2 RNA is generally detectable in upper and lower respiratory specimens during the acute phase of infection. Negative results do not preclude SARS-CoV-2 infection, do not rule out co-infections with other pathogens, and should not be used as the sole basis for treatment or other patient management decisions. Negative results must be combined with clinical observations, patient history, and epidemiological information. The expected result is Negative.  Fact Sheet for Patients: SugarRoll.be  Fact Sheet for Healthcare Providers: https://www.woods-mathews.com/  This test is not yet approved or cleared by the [place] FDA and  has been authorized for detection and/or diagnosis of SARS-CoV-2 by FDA under an Emergency Use Authorization (EUA). This EUA will remain  in  effect (meaning this test can be used) for the duration of the COVID-19 declaration under Se ction 564(b)(1) of the Act, 21 U.S.C. section 360bbb-3(b)(1), unless the authorization is terminated or revoked sooner.  Performed at [facility], [place] [address] [place], [place] [address]   SARS CORONAVIRUS 2 (TAT 6-24 HRS) Nasopharyngeal Nasopharyngeal Swab     Status: None   Collection Time: 08/24/20  6:08 PM   Specimen: Nasopharyngeal Swab  Result Value Ref Range Status   SARS Coronavirus 2 NEGATIVE NEGATIVE Final    Comment: (NOTE) SARS-CoV-2 target nucleic acids are NOT DETECTED.  The SARS-CoV-2 RNA is generally detectable in upper and lower respiratory specimens during the acute phase of infection. Negative results do not preclude SARS-CoV-2 infection, do not rule out co-infections with other pathogens, and should not be used as the sole basis for treatment or other patient management decisions. Negative results must be combined with clinical observations, patient history, and epidemiological information. The expected result is Negative.  Fact Sheet for Patients: SugarRoll.be  Fact Sheet for Healthcare Providers: https://www.woods-mathews.com/  This test is not yet approved or cleared by the [place] FDA and  has been authorized for detection and/or diagnosis of SARS-CoV-2 by FDA under an Emergency Use Authorization (EUA). This EUA will remain  in effect (meaning this test can be used) for the  duration of the COVID-19 declaration under Se ction 564(b)(1) of the Act, 21 U.S.C. section 360bbb-3(b)(1), unless the authorization is terminated or revoked sooner.  Performed at [facility], [place] [address], [place], [place] [address]      Time coordinating discharge: 70mins  SIGNED:   Little Ishikawa, MD  [facility] 08/26/2020, 8:11 AM   If 7PM-7AM, please contact night-coverage [email or web address]

## 2020-08-26 NOTE — Care Management Important Message (Signed)
Important Message  Patient Details  Name: Charles Randall. MRN: [id number] Date of Birth: 01-04-1928   Medicare Important Message Given:  Yes     Rhian Asebedo 08/26/2020, 2:37 PM

## 2020-08-26 NOTE — TOC Progression Note (Addendum)
Transition of Care (TOC) - Progression Note    Patient Details  Name: Charles Randall. MRN: [id number] Date of Birth: 1927/12/09  Transition of Care [facility]) CM/SW [facility], LCSW Phone Number: 08/26/2020, 9:10 AM  Clinical Narrative:    CSW spoke with [facility] and they are able to accept patient today. CSW updated patient's daughter, Lynelle Smoke and made her aware it is a semi-private room. She is requesting [facility] for transport and a call from RN once [facility] arrives and to make sure his shoes go with him.     Expected Discharge Plan: [place] Barriers to Discharge: SNF Pending bed offer, Continued Medical Work up  Expected Discharge Plan and Services Expected Discharge Plan: [place] Choice: [place] arrangements for the past 2 months: Single Family Home Expected Discharge Date: 08/25/20                                     Social Determinants of Health (SDOH) Interventions    Readmission Risk Interventions No flowsheet data found.

## 2020-08-26 NOTE — Progress Notes (Signed)
Occupational Therapy Treatment Patient Details Name: Charles Randall. MRN: [id number] DOB: 06-20-1927 Today's Date: 08/26/2020    History of present illness 87 y.o. 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   Pt presents to ED with frequent falls onset over the past 1.5 weeks.  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   OT comments  Pt making steady progress towards OT goals this session. Pt continues to present with impaired balance, decreased activity tolerance and L sided weakness impacting pts ability to complete BADLs independently. Pt currently requires MOD A +2 for x2 stand pivot transfer from EOB>BSC>recliner with RW. Pt currently requires MOD A for UB ADLS and MAX A for LB ADLS. Noted pts family prefers SNF for ST rehab, therefore updated DC recs to reflects families wishes. Do feel pt would greatly benefit from post acute OT to maximize functional independence prior to DC home. Will follow acutely per POC.    Follow Up Recommendations  SNF;Supervision/Assistance - 24 hour    Equipment Recommendations  None recommended by OT    Recommendations for Other Services      Precautions / Restrictions Precautions Precautions: Fall Precaution Comments: reports recent falls he would "just fall over" in any direction Restrictions Weight Bearing Restrictions: No       Mobility Bed Mobility Overal bed mobility: Needs Assistance Bed Mobility: Supine to Sit     Supine to sit: Mod assist;+2 for physical assistance;HOB elevated     General bed mobility comments: pt required step by step cues to sequence bed mobility, MOD +2 needed to fully maneuver BLES to EOB and elevate trunk into sitting    Transfers Overall transfer level: Needs assistance Equipment used: Rolling walker (2 wheeled) Transfers: Sit to/from [place] Sit to Stand: +2 physical assistance;Mod assist;Min assist Stand pivot transfers: Mod assist;+2 physical assistance       General transfer comment: pt intially MOD A +2 to rise from EOB, increased assist needed to elevate trunk into standing and shift hips anteriorly, but progressed to MIN A +2 from [facility] with use of elevated arm rests to power into standing, MOD A +2 throughout session to transfer from EOB >BSC>recliner as pt needed assist to manage RW and for external support as pt present with impaired posture noted to prefer to lean on RW, pt also required cues for safety throughout transfers as pt attempting to sit prematurely throughout session    Balance Overall balance assessment: Needs assistance Sitting-balance support: Feet supported;Bilateral upper extremity supported Sitting balance-Leahy Scale: Poor Sitting balance - Comments: BUE support needed on bed and external assist, one LOB posteriorly with no righting reaction Postural control: Posterior lean Standing balance support: Bilateral upper extremity supported Standing balance-Leahy Scale: Poor Standing balance comment: bil UE support and external assist needed                           ADL either performed or assessed with clinical judgement   ADL Overall ADL's : Needs assistance/impaired                 Upper Body Dressing : Moderate assistance;Sitting Upper Body Dressing Details (indicate cue type and reason): to don posterior gown Lower Body Dressing: Maximal assistance;Sit to/from stand Lower Body Dressing Details (indicate cue type and reason): to don footwear Toilet  Transfer: Moderate assistance;Minimal assistance;+2 for physical assistance;+2 for safety/equipment;BSC;Stand-pivot;RW Toilet Transfer Details (indicate cue type and reason): pt required MOD A +2 to rise from EOB, MOD A +1 to pivot to [facility] needing assist to manage RW and sequential cues for pivotal steps. pt progressed to  MIN A +2 to stand from [facility] ut MOD A +2 to transfer to [place] and Hygiene: Maximal assistance;+2 for safety/equipment;Sit to/from stand Toileting - Clothing Manipulation Details (indicate cue type and reason): psoterior pericare in standing     Functional mobility during ADLs: Moderate assistance;+2 for physical assistance;Rolling walker General ADL Comments: pt continues to present with impaired balance, impaired posture and L sided weakness     Vision       Perception     Praxis      Cognition Arousal/Alertness: Awake/alert Behavior During Therapy: Flat affect Overall Cognitive Status: No family/caregiver present to determine baseline cognitive functioning                                 General Comments: overall WFL, following commands and alert, mild safety impairments noted as pt attempting to sit prematurely x2 during session        Exercises     Shoulder Instructions       General Comments pt able to void bowels during session, BP 119/54 SpO2 96% on RA at start of session      Pertinent Vitals/ Pain       Pain Assessment: No/denies pain (pt denies pain but noted to moan during session, when asked pt still denies pain but states its difficult to move his LLE)  Home Living                                          Prior Functioning/Environment              Frequency  Min 2X/week        Progress Toward Goals  OT Goals(current goals can now be found in the care plan section)  Progress towards OT goals: Progressing toward goals  Acute Rehab OT Goals Patient Stated Goal: to go to rehab OT Goal Formulation: With patient Time For Goal Achievement: 09/05/20 Potential to Achieve Goals: Good  Plan Discharge plan needs to be updated;Frequency needs to be updated    Co-evaluation                 AM-PAC OT "6 Clicks" Daily Activity     Outcome Measure   Help from another person  eating meals?: None Help from another person taking care of personal grooming?: A Little Help from another person toileting, which includes using toliet, bedpan, or urinal?: A Lot Help from another person bathing (including washing, rinsing, drying)?: A Lot Help from another person to put on and taking off regular upper body clothing?: A Little Help from another person to put on and taking off regular lower body clothing?: A Lot 6 Click Score: 16    End of Session Equipment Utilized During Treatment: Gait belt;Rolling walker  OT Visit Diagnosis: Unsteadiness on feet (R26.81);Repeated falls (R29.6);Muscle weakness (generalized) (M62.81)   Activity Tolerance Patient tolerated treatment well   Patient Left in chair;with call bell/phone within reach;with chair alarm set   Nurse Communication Mobility status;Other (comment) (pt up in recliner, +2 for back  to bed, can try stedy if increased assist needed)        Time: 0946-1010 OT Time Calculation (min): 24 min  Charges: OT General Charges $OT Visit: 1 Visit OT Treatments $Self Care/Home Management : 23-37 mins Harley [place]., COTA/L Acute Rehabilitation Services [phone]    Precious Haws 08/26/2020, 10:40 AM

## 2020-08-26 NOTE — Care Management Important Message (Signed)
Important Message  Patient Details  Name: Charles Randall. MRN: [id number] Date of Birth: 14-Feb-1928   Medicare Important Message Given:  Yes  Patient left prior to IM delivery will mail to home address.    [name] 08/26/2020, 2:37 PM

## 2020-08-28 ENCOUNTER — Other Ambulatory Visit: Payer: Medicare Other | Admitting: Student

## 2020-08-28 ENCOUNTER — Other Ambulatory Visit: Payer: Self-pay

## 2020-08-28 DIAGNOSIS — L89620 Pressure ulcer of left heel, unstageable: Secondary | ICD-10-CM

## 2020-08-28 DIAGNOSIS — Z515 Encounter for palliative care: Secondary | ICD-10-CM

## 2020-08-28 DIAGNOSIS — G40909 Epilepsy, unspecified, not intractable, without status epilepticus: Secondary | ICD-10-CM

## 2020-08-28 DIAGNOSIS — R531 Weakness: Secondary | ICD-10-CM

## 2020-08-28 DIAGNOSIS — I619 Nontraumatic intracerebral hemorrhage, unspecified: Secondary | ICD-10-CM

## 2020-08-28 NOTE — Progress Notes (Signed)
[facility] Palliative Care Consult Note Telephone: [phone]  Fax: [phone]   Date of encounter: 08/28/20 PATIENT NAME: Charles Randall [address] [place] [address]   [phone] (home)  DOB: [birth year] MRN: [id number] PRIMARY CARE PROVIDER:    Nickola Major, MD,  [address] [place] [address] [phone]  REFERRING PROVIDER:   Nickola Major, MD [address] [place],  [place] [address] [phone]  RESPONSIBLE PARTY:    Contact Information     Name Relation Home Work [place], [place] Daughter   [phone]   Harris,Tamela Significant other [phone]  [phone]   Jacqualyn Posey Daughter [phone]         I met face to face with patient in the facility. Palliative Care was asked to follow this patient by consultation request of  Dr. Keenan Bachelor to address advance care planning and complex medical decision making. This is the initial visit.                                     ASSESSMENT AND PLAN / RECOMMENDATIONS:   Advance Care Planning/Goals of Care: Goals include to maximize quality of life and symptom management. Our advance care planning conversation included a discussion about:    The value and importance of advance care planning  Experiences with loved ones who have been seriously ill or have died  Exploration of personal, cultural or spiritual beliefs that might influence medical decisions  Exploration of goals of care in the event of a sudden injury or illness  Identification and preparation of a healthcare agent  MOST form reviewed.  CODE STATUS: DNR  Education provided on Palliative Medicine vs. Hospice services. Palliative Medicine will continue to provide support. Plan to complete therapy with hopes of returning home.     Symptom Management/Plan:  Generalized weakness-secondary to hemorrhagic stroke, post polio syndrome. Continue PT/OT as  directed. Walker for ambulation. Monitor for falls/safety.   Hemorrhagic stroke-continue aspirin daily, Zetia daily. Follow up with Neurology as scheduled.   Seizure disorder-continue carbamazepine and Keppra as directed.   Unstageable wound to left heel-continue wound care per facility directions. Free float heels.   Follow up Palliative Care Visit: Palliative care will continue to follow for complex medical decision making, advance care planning, and clarification of goals. Return in 4 weeks or prn.  I spent 45 minutes providing this consultation. More than 50% of the time in this consultation was spent in counseling and care coordination.  PPS: 40%  HOSPICE ELIGIBILITY/DIAGNOSIS: TBD  Chief Complaint: Palliative Medicine initial consult.   HISTORY OF PRESENT ILLNESS:  Keaston Pile. is a 87 y.o. year old male  with CKD 3, hypertension, seizure disorder, post polio syndrome, diastolic heart failure. Hospitalized 6/24-6/29/2022 due to hemorrhagic stroke. Patient noted to have left kidney mass and thoracic aortic aneurysm on CT scan 06/2020.   Patient currently at [facility]. He is receiving therapy services. He denies pain, shortness of breath at rest; occasional shortness of breath with exertion. Endorses generalized weakness. Endorses a good appetite. No seizure activity reported in past several years. Has unstageable wound to left heel; dressing to right heel for protection. Scab to left scalp.   History obtained from review of EMR, discussion with primary team, and interview with family, facility staff/caregiver and/or Charles Randall.  I reviewed available labs, medications,  imaging, studies and related documents from the EMR.  Records reviewed and summarized above.   ROS  General: NAD EYES: denies vision changes ENMT: denies dysphagia Cardiovascular: denies chest pain Pulmonary: denies cough Abdomen: endorses good appetite, denies constipation GU: denies dysuria MSK:  weakness,  no falls reported Skin: wound to left heel Neurological: denies pain, denies insomnia Psych: Endorses stable mood Heme/lymph/immuno: denies bruises, abnormal bleeding  Physical Exam Pulse 72, resp 16, sats 97% on room air Constitutional: NAD General: frail appearing, thin EYES: anicteric sclera, lids intact, no discharge  ENMT: intact hearing, oral mucous membranes moist, dentition intact CV: S1S2, RRR, trace LE edema Pulmonary: LCTA, no increased work of breathing, no cough, room air Abdomen:  normo-active BS + 4 quadrants, soft and non tender GU: deferred MSK: moves all extremities, ambulatory Skin: warm and dry, pale, dressings to bilateral heels CDI, scab/crusting to left anterior scalp Neuro: generalized weakness, A & O x 3 Psych: non-anxious affect, pleasant Hem/lymph/immuno: no widespread bruising CURRENT PROBLEM LIST:  Patient Active Problem List   Diagnosis Date Noted   Hemorrhagic stroke ([place]) 85/01/4579   Acute diastolic CHF (congestive heart failure) (HCC)    History of hemiarthroplasty of left hip 02/13/2018   Closed fracture of neck of left femur ([place]) 02/12/2018   Moderate aortic regurgitation 02/12/2018   CKD (chronic kidney disease), stage III ([place]) 02/12/2018   Displaced fracture of left femoral neck ([place]) 02/12/2018   Left displaced femoral neck fracture ([place]) 02/12/2018   Arterial insufficiency of lower extremity ([place]) 11/22/2017   Bilateral inguinal hernia (BIH) s/p lap hernia repair w mesh 08/17/2017 08/17/2017   Femoral hernia - right - s/p lap hernia repair w mesh 08/17/2017 08/17/2017   Dysphagia 04/06/2016   Partial symptomatic epilepsy with complex partial seizures, not intractable, without status epilepticus ([place]) 11/16/2015   Normocytic anemia 10/13/2015   Seizure disorder ([place]) 05/12/2015   Edema of lower extremity 04/09/2015   Benign essential hypertension 04/09/2015   Pleural effusion, right 04/09/2015   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    Memory loss 04/07/2015   Mixed hyperlipidemia 04/07/2015   Pain of left thumb 04/07/2015   Simple partial seizure disorder ([place]) 04/07/2015   Skin lesion of right arm 04/07/2015   Transient alteration of awareness 04/07/2015   PAST MEDICAL HISTORY:  Active Ambulatory Problems    Diagnosis Date Noted   Edema of lower extremity 04/09/2015   Benign essential hypertension 04/09/2015   Pleural effusion, right 04/09/2015   Seizure disorder ([place]) 05/12/2015   Partial symptomatic epilepsy with complex partial seizures, not intractable, without status epilepticus ([place]) 11/16/2015   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   Bilateral inguinal hernia (BIH) s/p lap hernia repair w mesh 08/17/2017 08/17/2017   Femoral hernia - right - s/p lap hernia repair w mesh 08/17/2017 08/17/2017   Closed fracture of neck of left femur ([place]) 02/12/2018   Moderate aortic regurgitation 02/12/2018   CKD (chronic kidney disease), stage III ([place]) 02/12/2018   Displaced fracture of left femoral neck ([place]) 02/12/2018   Left displaced femoral neck fracture ([place]) 99/83/3825   Acute diastolic CHF (congestive heart failure) (HCC)    Arterial insufficiency of lower extremity ([place]) 11/22/2017   Dysphagia 04/06/2016   History of hemiarthroplasty of left hip 02/13/2018   Memory loss 04/07/2015   Mixed hyperlipidemia 04/07/2015   Normocytic anemia 10/13/2015   Pain of left thumb 04/07/2015   Simple partial seizure disorder ([place]) 04/07/2015   Skin lesion of right arm 04/07/2015   Transient alteration of awareness  04/07/2015   Hemorrhagic stroke ([place]) 08/21/2020   Resolved Ambulatory Problems    Diagnosis Date Noted   No Resolved Ambulatory Problems   Past Medical History:  Diagnosis Date   Anemia    Aortic insufficiency    Arthritis    Bleeds easily ([place])    Chronic kidney disease    Cough    Dyslipidemia    H/O dizziness    H/O Doppler ultrasound 2010   H/O echocardiogram 2013   H/O echocardiogram 2012   Heart  murmur    History of hiatal hernia    HTN (hypertension)    Inguinal hernia    Mitral regurgitation    Pneumonia FEW YEARS AGO   Seizures (HCC)    Swelling of both lower extremities    Tricuspid regurgitation    SOCIAL HX:  Social History   Tobacco Use   Smoking status: Former    Packs/day: 0.50    Years: 15.00    Pack years: 7.50    Types: Cigarettes    Quit date: 03/01/1963    Years since quitting: 57.5   Smokeless tobacco: Never  Substance Use Topics   Alcohol use: No   FAMILY HX:  Family History  Problem Relation Age of Onset   Cancer Mother    Heart disease Father    Cancer Maternal Grandmother    Cancer Maternal Grandfather    Kidney failure Sister       ALLERGIES: No Known Allergies   PERTINENT MEDICATIONS:  Outpatient Encounter Medications as of 08/28/2020  Medication Sig   Ascorbic Acid (VITAMIN C) 1000 MG tablet Take 1,000 mg by mouth daily.   aspirin EC 81 MG tablet Take 1 tablet (81 mg total) by mouth every other day.   carbamazepine (CARBATROL) 100 MG 12 hr capsule Take 1 capsule (100 mg total) by mouth 2 (two) times daily.   Cholecalciferol (VITAMIN D-3) 1000 UNITS CAPS Take 1,000 Units by mouth daily.    Dupilumab (DUPIXENT) 300 MG/2ML SOPN Inject into the skin. Every 2 weeks,injection   ezetimibe (ZETIA) 10 MG tablet TAKE 1 TABLET BY MOUTH ONCE DAILY. THIS IS TO REPLACE THE ATORVASTATIN   hydrochlorothiazide (HYDRODIURIL) 12.5 MG tablet Take 6.25 mg by mouth daily.   levETIRAcetam (KEPPRA) 750 MG tablet Take 2 tablets (1,500 mg total) by mouth 2 (two) times daily.   magnesium oxide (MAG-OX) 400 MG tablet Take 400 mg by mouth daily.   pantoprazole (PROTONIX) 40 MG tablet Take 1 tablet (40 mg total) by mouth daily.   triamcinolone ointment (KENALOG) 0.1 % Apply 1 application topically 2 (two) times daily as needed.   vitamin B-12 (CYANOCOBALAMIN) 1000 MCG tablet Take 1,000 mcg by mouth daily.   vitamin E 400 UNIT capsule Take 400 Units by mouth daily.     No facility-administered encounter medications on file as of 08/28/2020.   Thank you for the opportunity to participate in the care of Charles Randall.  The palliative care team will continue to follow. Please call our office at [phone] if we can be of additional assistance.   Ezekiel Slocumb, NP ,   COVID-19 PATIENT SCREENING TOOL Asked and negative response unless otherwise noted:  Have you had symptoms of covid, tested positive or been in contact with someone with symptoms/positive test in the past 5-10 days? No

## 2020-09-03 ENCOUNTER — Telehealth: Payer: Self-pay

## 2020-09-03 NOTE — Telephone Encounter (Signed)
Telephone Call/Scheduled Initial Visit SW completed call with patient/PCG to schedule an initial palliative care visit. SW left a message for PCG-Tamela, requesting a call back to discuss palliative care and schedule a visit.

## 2020-09-03 NOTE — Telephone Encounter (Signed)
Telephone Call/Scheduled Initial Visit SW completed call with patient/PCG to schedule an initial palliative care visit. Visit scheduled for 09/08/20 @ 12 pm. Patient is currently in [facility] for rehab.

## 2020-09-08 ENCOUNTER — Other Ambulatory Visit: Payer: Self-pay

## 2020-09-08 ENCOUNTER — Non-Acute Institutional Stay: Payer: Medicare Other

## 2020-09-08 ENCOUNTER — Non-Acute Institutional Stay: Payer: Medicare Other | Admitting: *Deleted

## 2020-09-08 DIAGNOSIS — Z515 Encounter for palliative care: Secondary | ICD-10-CM

## 2020-09-08 NOTE — Progress Notes (Signed)
[facility] PALLIATIVE CARE RN NOTE  PATIENT NAME: Charles Randall. DOB: 1927/05/05 MRN: [id number]  PRIMARY CARE PROVIDER: Nickola Major, MD  RESPONSIBLE PARTY: Jule Ser (significant other) Acct ID - Guarantor Home Phone Work Phone Relationship Acct Type  [id number] [facility]* [phone]  Self P/F     [place], [place], [place] [address]   Covid-19 Pre-screening Negative  PLAN OF CARE and INTERVENTION:  ADVANCE CARE PLANNING/GOALS OF CARE: Goal is for patient to get stronger and be able to return back to his home. He has a DNR and a MOST form: Limited Additional Intervention, Antibiotics if indicated, IV fluids if indicated, No feeding tube. PATIENT/CAREGIVER EDUCATION: Symptom management, safe mobility/transfers DISEASE STATUS: Face-to-face visit completed in patient's room. He currently resides at [facility] for rehab. Upon arrival, patient is lying in bed awake and alert. He is able to answer simple questions appropriately. He is slow to respond at times. He reports feeling "fair" today. He denies any pain or discomfort. No shortness of breath noted while he is at rest. He does have dyspnea on exertion. He continues with PT/OT for strengthening. He says that he has made some progress. He does have some generalized weakness, but says he is weaker on his left side than his right. He requires 1 person assistance with standing, ambulating, bathing and dressing. He is ambulatory using his walker with assistance. He denies any pain or discomfort at this time. He says he tires easily and seems to be sleeping more overall. Spoke with staff who feels that patient is slowly improving. They don't report any concerns. His intake is good. He denies any difficulties swallowing. He is taking all of his medications without difficulty. I notice both forearms are wrapped in Kerlix. He states that he has skin tears in those areas, but denies any recent falls. Will continue to  monitor.   HISTORY OF PRESENT ILLNESS: This is a 87 yo male with a past medical history of hemorrhagic stroke, CHF, HTN, seizure disorder, post-polio syndrome and CKD stage 3. Palliative care team continues to follow patient for complex medical decision making and additional support.   CODE STATUS: DNR ADVANCED DIRECTIVES: Y MOST FORM: yes PPS: 40%   PHYSICAL EXAM:   LUNGS: clear to auscultation  CARDIAC: Cor RRR EXTREMITIES: Trace edema to BLE SKIN:  Unstageable wound to left heel, right heel dressing for protection intact, skin tears to bilateral forearms wrapped with Kerlix   NEURO:  Alert and oriented x 3, generalized weakness, ambulatory w/walker and 1 person assistance   (Duration of visit and documentation 45 minutes)   Daryl Eastern, RN BSN

## 2020-10-12 ENCOUNTER — Ambulatory Visit: Payer: Medicare Other | Admitting: Family Medicine

## 2020-10-13 ENCOUNTER — Telehealth: Payer: Self-pay | Admitting: Diagnostic Neuroimaging

## 2020-10-13 ENCOUNTER — Encounter: Payer: Self-pay | Admitting: Diagnostic Neuroimaging

## 2020-10-13 ENCOUNTER — Ambulatory Visit (INDEPENDENT_AMBULATORY_CARE_PROVIDER_SITE_OTHER): Payer: Medicare Other | Admitting: Diagnostic Neuroimaging

## 2020-10-13 VITALS — BP 100/64 | HR 95

## 2020-10-13 DIAGNOSIS — G40209 Localization-related (focal) (partial) symptomatic epilepsy and epileptic syndromes with complex partial seizures, not intractable, without status epilepticus: Secondary | ICD-10-CM | POA: Diagnosis not present

## 2020-10-13 DIAGNOSIS — R6889 Other general symptoms and signs: Secondary | ICD-10-CM

## 2020-10-13 DIAGNOSIS — I6389 Other cerebral infarction: Secondary | ICD-10-CM | POA: Diagnosis not present

## 2020-10-13 NOTE — Progress Notes (Signed)
[facility]  PATIENT: Charles Randall. DOB: [birth year]  REFERRING CLINICIAN:  HISTORY FROM: patient and SO REASON FOR VISIT: follow up   HISTORICAL  CHIEF COMPLAINT:  Chief Complaint  Patient presents with   Brain infarction    Rm 6 Hospital stroke FU, resides at [facility], dgtr- [place], friend- Tamila    HISTORY OF PRESENT ILLNESS:   UPDATE (10/13/20, VRP): Since last visit, doing well until June 2022, had  more falls, and diagnosed with right BG stroke. Now at [facility] place.  UPDATE (01/27/20, VRP): Since last visit, doing well until 12/08/19; woke up witrh left eyelid ptosis and headaches. 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  Since then symptoms are stable.  No significant improvement.  Regarding seizures patient is stable and no recurrence.  Tolerating medications.  UPDATE (04/04/18, VRP): Since last visit, was doing well until 03/23/18 and 03/25/18 (multiple breakthrough seizures). Was 1 month post-op from surgery (hip fracture). Now stable. No other triggering factors. Tolerating levetiracetam '1500mg'$  twice a day.   PCP discontinued aspirin '81mg'$  daily in March 2019 (? Age or bleeding risk).   UPDATE (09/04/2017, CM): Charles Randall, 87 year old male returns for follow-up with history of seizure disorder /TIA.He is currently on aspirin for secondary stroke prevention without further TIA symptoms .He remains on Keppra 750 mg 2 tablets twice daily he denies any side effects to his medication.  He ambulates with a rolling walker.  No recent falls.  He returns for reevaluation.  UPDATE 08/30/16 VRP: Since last visit, doing well. No more TIA or seizures. Tolerating medications.   UPDATE 05/23/16: Since last visit, was doing well until 2 weeks ago, had 10 min episode of right face drooping, slurred speech, right hand tingling (~05/02/16). Then resolved. Did not seek medical attention. Saw Dr. Hollie Salk, and she referred  patient to see [place]. No other alleviating or aggravating factors.  UPDATE 11/16/15: Since last visit, doing well. No definite seizures. Tolerating LEV '1500mg'$  BID. Patient not driving, due to concern from his friend about poss of un-witnessed seizures. No indication of this, but rather the theoretical possibility.   UPDATE 05/12/15: Since last visit, still having 1-2 seizures per year (staring spells; mouth automatisms) --> 2014 x 1; 2015 x 2; 2016 x 2. Still with interrupted sleep. Tolerating LEV '1000mg'$  BID.  UPDATE 05/29/12: Doing well. No seizures. Tolerating medications.  UPDATE 11/29/11:  Has not had any seizure episodes.  Tolerating LEV '1000mg'$  BID, tolerating well without mood changes or increased dizziness.  No complaints today.  He has been driving when he absolutely has no other way.    UPDATE 08/29/11:  He has had 2 seizures on 08/11/11.  Were similar to his previous seizures.  Denies bowel/bladder incontinence. Denies sleep changes, missed doses, fever or poor food/fluid  intake.  He was increased to '1000mg'$  BID, tolerating well without dizziness or mood changes.    UPDATE 10/18/10: Had another spell on 10/13/10.  Woke up normally, read paper.  Then around 9am, was found by girlfried, slumped against wall in bedroom, with blood from mouth.  Taken to ER by EMS.  He was somewhat combative and confused for 2-3 hours.   CT head and neck unremarkable.  Started on LEV '500mg'$  BID.  No further events.  Family reports poor sleep patterns, snoring and daytime sleepiness.  Sleeps 5 hrs per night, tossing and turning.  Able to fall asleep very easily.  UPDATE 06/30/10:  No further events.  Last spell 03/15/10.  Not driving.  Feels good.  Testing reviewed.  PRIOR HPI: 87 yo right-handed Caucasian male with a past medical history of HTN, hyperlipidemia, post-polio syndrome, vitamin D deficiency, osteoporosis with history of compression fracture, left inguinal hernia, and eczema who presents for evaluation for possible  atypical seizures. He is accompanied today by his long-term girlfriend who helps provide details regarding these episodes. Starting about 2-3 years ago and occurring about every 6 months, the patient has had spells where he will "blank out" for about 20-30 minutes. He reports complete amnesia during the spells, but denies any other symptoms occurring during or after the episodes. Other than a possible "warning surge" in his head immediately prior to the onset of the episodes, he denies any symptoms preceding the episodes. He has a hard time describing what the "warning surge" is. His girlfriend has noticed that during the episodes he has a "blank stare," "his face gets really white," and he will ask many questions over and over such as: "Where are we?" and "What are we doing here?" She denies any other signs, including loss of postural control, loss of bowel or bladder continence, orofacial twitching, tongue biting, etc. Immediately following the episodes, the patient is back to his normal self. 08/2008 to 09/2008: MRI brain w/ and w/out, MRA w/out, carotid dopplers, EEG, and neurocognitive battery all unrevealing. 10-hour ambulatory EEG on 12/2009 which was negative. Most recent episode: March 15, 2010 around 12:30 PM, and was different from previous four episodes. The girlfriend found him "slumped down" in a chair with his legs and arms "as stiff as a board" (arms flexed at elbow, legs flexed at knees, eyes closed). She was able to rouse him after about 3-4 minutes, and he reports being "as weak as water" afterward, as well as very tired, which led him to sleep for the rest of the day.   REVIEW OF SYSTEMS: Full 14 system review of systems performed and negative with exception of: joint pain dizziness eye itching.   ALLERGIES: No Known Allergies  HOME MEDICATIONS: Outpatient Medications Prior to Visit  Medication Sig Dispense Refill   Ascorbic Acid (VITAMIN C) 1000 MG tablet Take 1,000 mg by mouth  daily.     aspirin EC 81 MG tablet Take 1 tablet (81 mg total) by mouth every other day. 90 tablet 3   carbamazepine (CARBATROL) 100 MG 12 hr capsule Take 1 capsule (100 mg total) by mouth 2 (two) times daily. 180 capsule 4   Cholecalciferol (VITAMIN D-3) 1000 UNITS CAPS Take 1,000 Units by mouth daily.      ezetimibe (ZETIA) 10 MG tablet TAKE 1 TABLET BY MOUTH ONCE DAILY. THIS IS TO REPLACE THE ATORVASTATIN 90 tablet 0   hydrochlorothiazide (HYDRODIURIL) 12.5 MG tablet Take 6.25 mg by mouth daily.     levETIRAcetam (KEPPRA) 750 MG tablet Take 2 tablets (1,500 mg total) by mouth 2 (two) times daily. 360 tablet 4   magnesium oxide (MAG-OX) 400 MG tablet Take 400 mg by mouth daily.     pantoprazole (PROTONIX) 40 MG tablet Take 1 tablet (40 mg total) by mouth daily.     triamcinolone ointment (KENALOG) 0.1 % Apply 1 application topically 2 (two) times daily as needed.     vitamin B-12 (CYANOCOBALAMIN) 1000 MCG tablet Take 1,000 mcg by mouth daily.     vitamin E 400 UNIT capsule Take 400 Units by mouth daily.      Dupilumab (DUPIXENT) 300 MG/2ML SOPN  Inject into the skin. Every 2 weeks,injection (Patient not taking: Reported on 10/13/2020)     No facility-administered medications prior to visit.    PAST MEDICAL HISTORY: Past Medical History:  Diagnosis Date   Anemia    STARTED PILLS 1 MONTH AGO NEW DX   Aortic insufficiency    mild, moderate   Arthritis    FINGERS SWELL PT THINKS ARTHITIS, TOP OF WRIST ALSO SWELLS ONCE IN A WHILE   Bleeds easily (HCC)    BRUISE EASY ALSO   Chronic kidney disease    Cough    LAST 3 TO 4 WEEKS PHLEGM YELLOW INTERMITTENT COUGH NO FEVER   Dyslipidemia    H/O dizziness    OCC    H/O Doppler ultrasound 2010   H/O echocardiogram 2013   aortic valve disorder, EF =>55%   H/O echocardiogram 2012   AVD, EF =>55%   Heart murmur    Hemorrhagic stroke (HCC)    History of hiatal hernia    HTN (hypertension)    Inguinal hernia    Mitral regurgitation    mild    Pneumonia FEW YEARS AGO   X 1   Post-polio syndrome age 65   Seizures ([place])    last sz 2016 NO CAUSE FOUND, 3 seizures on 03/25/18    Swelling of both lower extremities    DOES NOT ALWAYS DO DOWN    Tricuspid regurgitation    mild    PAST SURGICAL HISTORY: Past Surgical History:  Procedure Laterality Date   COLONSCOPY  5 YRS AGO   DENTAL SURGERY  3-4 WEEKS AGO   FEMORAL HERNIA REPAIR Right 08/17/2017   Procedure: HERNIA REPAIR FEMORAL LAPAROSCOPIC;  Surgeon: Michael Boston, MD;  Location: [facility];  Service: General;  Laterality: Right;   INGUINAL HERNIA REPAIR Bilateral 08/17/2017   Procedure: LAPAROSCOPIC BILATERAL INGUINAL HERNIA REPAIR;  Surgeon: Michael Boston, MD;  Location: [facility];  Service: General;  Laterality: Bilateral;   INSERTION OF MESH Bilateral 08/17/2017   Procedure: INSERTION OF MESH;  Surgeon: Michael Boston, MD;  Location: [facility];  Service: General;  Laterality: Bilateral;   TOTAL HIP ARTHROPLASTY Left 02/13/2018   Procedure: HEMI  ARTHROPLASTY;  Surgeon: Paralee Cancel, MD;  Location: [facility];  Service: Orthopedics;  Laterality: Left;    FAMILY HISTORY: Family History  Problem Relation Age of Onset   Cancer Mother    Heart disease Father    Cancer Maternal Grandmother    Cancer Maternal Grandfather    Kidney failure Sister     SOCIAL HISTORY:  Social History   Socioeconomic History   Marital status: Unknown    Spouse name: Not on file   Number of children: 2   Years of education: BA [employer]   Highest education level: Not on file  Occupational History   Occupation: Retired  Tobacco Use   Smoking status: Former    Packs/day: 0.50    Years: 15.00    Pack years: 7.50    Types: Cigarettes    Quit date: 03/01/1963    Years since quitting: 27.6   Smokeless tobacco: Never  Vaping Use   Vaping Use: Never used  Substance and Sexual Activity   Alcohol use: No   Drug use: No   Sexual activity: Not on file  Other Topics Concern   Not on file  Social History  Narrative   10/13/20 currently at [facility]      87 year old right-handed male who lives at home alone.  He is single (has long-term girlfriend) and has two children. He drinks one cup caffeinated coffee daily.   Social Determinants of Health   Financial Resource Strain: Not on file  Food Insecurity: Not on file  Transportation Needs: Not on file  Physical Activity: Not on file  Stress: Not on file  Social Connections: Not on file  Intimate Partner Violence: Not on file     PHYSICAL EXAM  GENERAL EXAM/CONSTITUTIONAL: Vitals:  Vitals:   10/13/20 1120  BP: 100/64  Pulse: 95   There is no height or weight on file to calculate BMI. No results found. Patient is in no distress; well developed, nourished and groomed; neck is supple  CARDIOVASCULAR: Examination of carotid arteries is normal; no carotid bruits Regular rate and rhythm, no murmurs Examination of peripheral vascular system by observation and palpation is NOTABLE FOR DUSKY FEET AND VENOUS STASIS BILATERALLY  EYES: Ophthalmoscopic exam of optic discs and posterior segments is normal; no papilledema or hemorrhages  MUSCULOSKELETAL: Gait, strength, tone, movements noted in Neurologic exam below  NEUROLOGIC: MENTAL STATUS:  No flowsheet data found. awake, alert, oriented to person, place and time recent and remote memory intact normal attention and concentration language fluent, comprehension intact, naming intact,  fund of knowledge appropriate  CRANIAL NERVE:  2nd - no papilledema on fundoscopic exam 2nd, 3rd, 4th, 6th - RIGHT PUPIL REACTIVE; LEFT PUPIL 5MM NO REACTION; LEFT EYE DEVIATED LATERALLY AND SLIGHTLY INFERIORLY; MINIMAL MOVEMENT MEDIALLY AND SUPERIORLY; LEFT PTOSIS (COMPLETE) 5th - facial sensation symmetric 7th - facial strength --> DECR LEFT NL FOLD 8th - hearing intact 9th - palate elevates symmetrically, uvula midline 11th - shoulder shrug symmetric 12th - tongue protrusion  midline  MOTOR:  normal bulk and tone, full strength in the RUE, RLE; EXCEPT FOR BILATERAL FOOT DROP (RIGHT 3, LEFT 2-3) LEFT UPPER EXT SLIGHTLY SLOWER THAN RIGHT LEFT HIP FLEXION 2-3  SENSORY:  normal and symmetric to light touch, temperature, vibration  COORDINATION:  finger-nose-finger, fine finger movements normal  REFLEXES:  deep tendon reflexes --> BUE 1; BLE 0  GAIT/STATION:  IN WHEEL CHAIR    DIAGNOSTIC DATA (LABS, IMAGING, TESTING) - I reviewed patient records, labs, notes, testing and imaging myself where available.  Lab Results  Component Value Date   WBC 8.2 08/23/2020   HGB 10.4 (L) 08/23/2020   HCT 31.3 (L) 08/23/2020   MCV 90.5 08/23/2020   PLT 347 08/23/2020      Component Value Date/Time   NA 132 (L) 08/24/2020 0228   K 4.0 08/24/2020 0228   CL 98 08/24/2020 0228   CO2 22 08/24/2020 0228   GLUCOSE 102 (H) 08/24/2020 0228   BUN 31 (H) 08/24/2020 0228   CREATININE 1.50 (H) 08/24/2020 0228   CREATININE 2.11 (H) 02/16/2016 1144   CALCIUM 9.0 08/24/2020 0228   PROT 6.5 08/21/2020 1645   ALBUMIN 2.9 (L) 08/21/2020 1645   AST 22 08/21/2020 1645   ALT 18 08/21/2020 1645   ALKPHOS 75 08/21/2020 1645   BILITOT 0.4 08/21/2020 1645   GFRNONAA 44 (L) 08/24/2020 0228   GFRAA 53 (L) 02/19/2018 1508   Lab Results  Component Value Date   CHOL 131 08/23/2020   HDL 38 (L) 08/23/2020   LDLCALC 77 08/23/2020   TRIG 80 08/23/2020   CHOLHDL 3.4 08/23/2020   Lab Results  Component Value Date   HGBA1C 5.5 08/22/2020   Lab Results  Component Value Date   VITAMINB12 1,518 (H) 08/22/2020   Lab Results  Component Value Date   TSH 4.153 08/22/2020    10/17/14 lipid panel: CHOL 200, TRIG 91, HDL 65, LDL 117  05/04/10 EEG - normal  05/11/10 MRI BRAIN  - mild chronic small vessel ischemic disease.  03/04/16 TTE  - Left ventricle: The cavity size was normal. Wall thickness was   normal. Systolic function was normal. The estimated ejection   fraction was in  the range of 60% to 65%. Wall motion was normal;   there were no regional wall motion abnormalities. Features are   consistent with a pseudonormal left ventricular filling pattern,   with concomitant abnormal relaxation and increased filling   pressure (grade 2 diastolic dysfunction). - Aortic valve: Moderately calcified annulus. Moderately thickened,   moderately calcified leaflets. There was moderate regurgitation. - Mitral valve: There was mild regurgitation. - Pulmonary arteries: Systolic pressure was moderately increased.   PA peak pressure: 44 mm Hg (S).   04/08/16 EKG [I reviewed images myself and agree with interpretation. -VRP]  - normal sinus rhythm    05/20/16 carotid u/s - Minimal to moderate amount of bilateral atherosclerotic plaque, left greater than right, unchanged to minimally progressed compared to the 08/2008 examination though again not resulting in elevated peak systolic velocities within either internal carotid artery.   05/23/16 MRI brain (without) [I reviewed images myself and agree with interpretation. -VRP] 1. Mild perisylvian and temporal atrophy.  2. Mild periventricular and subcortical chronic small vessel ischemic disease.  3. No acute findings. 4. Compared to MRI on 09/18/08, there has been progression of atrophy and chronic small vessel ischemic disease.     12/08/19 MRI brain / orbits 1. No acute intracranial abnormality. 2. Age-related cerebral atrophy with mild-to-moderate chronic small vessel ischemic disease, with a few scattered remote bilateral cerebellar infarcts. 3. Normal MRI of the orbits. No findings to explain patient's symptoms identified. 4. Acute left sphenoid sinusitis.    12/08/19 CT head, CTA head / neck, CTV head  1. No emergent large vessel occlusion. 2. Delayed flow into the left sigmoid sinus. Contrast is present in the sigmoid sinus and jugular vein on the left on the CT venogram. No definitive etiology for delayed or  slow flow is present. The CT venogram demonstrates no thrombus. 3. Atherosclerotic changes at the carotid bifurcations bilaterally and cavernous internal carotid arteries bilaterally without significant stenosis relative to the more distal vessels. 4. Bilateral high-grade stenoses at the origins of the vertebral arteries. The left vertebral artery is dominant. No tandem stenosis is present. 5. No significant proximal stenosis, aneurysm, or branch vessel occlusion within the Circle of Willis. 6. 13 mm hypodense nodule in the posteroinferior right lobe of the thyroid. Not clinically significant; no follow-up imaging recommended (ref: J Am Coll Radiol. 2015 Feb;12(2): 143-50).  08/21/20 MRI brain [I reviewed images myself and agree with interpretation. -VRP]  1. Findings most consistent with an evolving subacute hemorrhagic right basal ganglia infarct as above. 9 mm soft tissue density along the lateral wall of the right lateral ventricle consistent with associated hemorrhage, corresponding with abnormality on prior CT. While these findings are favored to be vascular in ischemic in nature, a short interval follow-up MRI to ensure these changes resolve is recommended. 2. Additional subtle hazy diffusion abnormality involving the periventricular white matter adjacent to the frontal horn of the left lateral ventricle, also suspicious for evolving subacute small vessel ischemia. 3. Underlying age-related cerebral atrophy with chronic microvascular ischemic disease.    ASSESSMENT AND PLAN  87 y.o. 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 Last seizure was in 12/09/14.   Also with possible TIA on week of 05/02/16, right face drooping, right hand tingling, and slurred speech.  Last seizure 03/25/18.  Now left CN3 palsy (Nov 2021).   Dx: TIA vs seizure  1. Cerebrovascular accident (CVA) due to other mechanism  ([place])   2. Other general symptoms and signs   3. Partial symptomatic epilepsy with complex partial seizures, not intractable, without status epilepticus ([place])       PLAN:  STROKE (right basal ganglia infarct with hemorrhagic conversion; small vessel etiology likely) - continue aspirin, BP, DM, lipid control - repeat CT head (follow up study)  SEIZURE DISORDER (established problem, stable; last sz 03/25/18) - continue levetiracetam '1500mg'$  twice a day - continue carbamazepine '100mg'$  twice a day   MICROVASCULAR INFARCTION (left CN3 palsy) --> TIA / stroke prevention - continue aspirin '81mg'$  daily - follow up with PCP re: BP, lipid and diabetes screening and mgmt  Orders Placed This Encounter  Procedures   CT HEAD WO CONTRAST (5MM)     Return for return to PCP, pending if symptoms worsen or fail to improve.     Penni Bombard, MD [id number], [id number] PM Certified in Neurology, Neurophysiology and [facility] [address] [place], [place] [address] [phone]

## 2020-10-13 NOTE — Telephone Encounter (Signed)
Medicare/BCBS supp order sent to GI. NPR they will reach out to the patient to schedule.

## 2020-10-29 ENCOUNTER — Other Ambulatory Visit: Payer: Self-pay

## 2020-10-29 ENCOUNTER — Ambulatory Visit
Admission: RE | Admit: 2020-10-29 | Discharge: 2020-10-29 | Disposition: A | Discharge status: Home / Self Care | Payer: Medicare Other | Source: Ambulatory Visit | Attending: Diagnostic Neuroimaging | Admitting: Diagnostic Neuroimaging

## 2020-10-29 DIAGNOSIS — I6389 Other cerebral infarction: Secondary | ICD-10-CM

## 2020-10-29 DIAGNOSIS — I639 Cerebral infarction, unspecified: Secondary | ICD-10-CM | POA: Diagnosis not present

## 2020-11-04 ENCOUNTER — Telehealth: Payer: Self-pay | Admitting: Diagnostic Neuroimaging

## 2020-11-04 NOTE — Telephone Encounter (Signed)
Pt's daughter Tammy Elders, [place] called wanting to know if the RN can call her with the results of the pt's CT Scan. Please call daughter back at [phone] due to the pt living in a home now.

## 2020-11-04 NOTE — Telephone Encounter (Signed)
Spoke with Tammy, on DPR and informed her that CT scan of head showed unremarkable imaging results. No major new findings. Continue current plan. She verbalized understanding, appreciation.

## 2020-11-16 ENCOUNTER — Emergency Department (HOSPITAL_COMMUNITY): Payer: Medicare Other

## 2020-11-16 ENCOUNTER — Encounter (HOSPITAL_COMMUNITY): Payer: Self-pay | Admitting: Emergency Medicine

## 2020-11-16 ENCOUNTER — Other Ambulatory Visit: Payer: Self-pay

## 2020-11-16 ENCOUNTER — Inpatient Hospital Stay (HOSPITAL_COMMUNITY)
Admission: EM | Admit: 2020-11-16 | Discharge: 2020-11-19 | DRG: 840 | Disposition: A | Discharge status: Hospice | Payer: Medicare Other | Source: Skilled Nursing Facility | Attending: Internal Medicine | Admitting: Internal Medicine

## 2020-11-16 DIAGNOSIS — G9340 Encephalopathy, unspecified: Secondary | ICD-10-CM | POA: Diagnosis present

## 2020-11-16 DIAGNOSIS — Z79899 Other long term (current) drug therapy: Secondary | ICD-10-CM

## 2020-11-16 DIAGNOSIS — I129 Hypertensive chronic kidney disease with stage 1 through stage 4 chronic kidney disease, or unspecified chronic kidney disease: Secondary | ICD-10-CM | POA: Diagnosis present

## 2020-11-16 DIAGNOSIS — R5383 Other fatigue: Secondary | ICD-10-CM

## 2020-11-16 DIAGNOSIS — D638 Anemia in other chronic diseases classified elsewhere: Secondary | ICD-10-CM | POA: Diagnosis present

## 2020-11-16 DIAGNOSIS — E785 Hyperlipidemia, unspecified: Secondary | ICD-10-CM | POA: Diagnosis present

## 2020-11-16 DIAGNOSIS — D649 Anemia, unspecified: Secondary | ICD-10-CM

## 2020-11-16 DIAGNOSIS — Z96642 Presence of left artificial hip joint: Secondary | ICD-10-CM | POA: Diagnosis present

## 2020-11-16 DIAGNOSIS — Z9181 History of falling: Secondary | ICD-10-CM

## 2020-11-16 DIAGNOSIS — Z841 Family history of disorders of kidney and ureter: Secondary | ICD-10-CM

## 2020-11-16 DIAGNOSIS — Z8249 Family history of ischemic heart disease and other diseases of the circulatory system: Secondary | ICD-10-CM

## 2020-11-16 DIAGNOSIS — R569 Unspecified convulsions: Secondary | ICD-10-CM

## 2020-11-16 DIAGNOSIS — R627 Adult failure to thrive: Secondary | ICD-10-CM | POA: Diagnosis present

## 2020-11-16 DIAGNOSIS — I495 Sick sinus syndrome: Secondary | ICD-10-CM

## 2020-11-16 DIAGNOSIS — Z7982 Long term (current) use of aspirin: Secondary | ICD-10-CM

## 2020-11-16 DIAGNOSIS — I083 Combined rheumatic disorders of mitral, aortic and tricuspid valves: Secondary | ICD-10-CM | POA: Diagnosis present

## 2020-11-16 DIAGNOSIS — R64 Cachexia: Secondary | ICD-10-CM | POA: Diagnosis present

## 2020-11-16 DIAGNOSIS — Z809 Family history of malignant neoplasm, unspecified: Secondary | ICD-10-CM

## 2020-11-16 DIAGNOSIS — L8990 Pressure ulcer of unspecified site, unspecified stage: Secondary | ICD-10-CM | POA: Insufficient documentation

## 2020-11-16 DIAGNOSIS — C8599 Non-Hodgkin lymphoma, unspecified, extranodal and solid organ sites: Principal | ICD-10-CM | POA: Diagnosis present

## 2020-11-16 DIAGNOSIS — I440 Atrioventricular block, first degree: Secondary | ICD-10-CM | POA: Diagnosis present

## 2020-11-16 DIAGNOSIS — I69392 Facial weakness following cerebral infarction: Secondary | ICD-10-CM

## 2020-11-16 DIAGNOSIS — Z20822 Contact with and (suspected) exposure to covid-19: Secondary | ICD-10-CM | POA: Diagnosis present

## 2020-11-16 DIAGNOSIS — N179 Acute kidney failure, unspecified: Secondary | ICD-10-CM | POA: Diagnosis present

## 2020-11-16 DIAGNOSIS — G40909 Epilepsy, unspecified, not intractable, without status epilepticus: Secondary | ICD-10-CM | POA: Diagnosis present

## 2020-11-16 DIAGNOSIS — G9341 Metabolic encephalopathy: Secondary | ICD-10-CM | POA: Diagnosis present

## 2020-11-16 DIAGNOSIS — H4902 Third [oculomotor] nerve palsy, left eye: Secondary | ICD-10-CM | POA: Diagnosis present

## 2020-11-16 DIAGNOSIS — R1310 Dysphagia, unspecified: Secondary | ICD-10-CM | POA: Diagnosis present

## 2020-11-16 DIAGNOSIS — I10 Essential (primary) hypertension: Secondary | ICD-10-CM | POA: Diagnosis present

## 2020-11-16 DIAGNOSIS — R296 Repeated falls: Secondary | ICD-10-CM | POA: Diagnosis present

## 2020-11-16 DIAGNOSIS — I619 Nontraumatic intracerebral hemorrhage, unspecified: Secondary | ICD-10-CM | POA: Diagnosis present

## 2020-11-16 DIAGNOSIS — N289 Disorder of kidney and ureter, unspecified: Secondary | ICD-10-CM | POA: Diagnosis present

## 2020-11-16 DIAGNOSIS — Z87891 Personal history of nicotine dependence: Secondary | ICD-10-CM

## 2020-11-16 DIAGNOSIS — Z515 Encounter for palliative care: Secondary | ICD-10-CM

## 2020-11-16 DIAGNOSIS — N183 Chronic kidney disease, stage 3 unspecified: Secondary | ICD-10-CM | POA: Diagnosis present

## 2020-11-16 DIAGNOSIS — R633 Feeding difficulties, unspecified: Secondary | ICD-10-CM | POA: Diagnosis present

## 2020-11-16 DIAGNOSIS — I69354 Hemiplegia and hemiparesis following cerebral infarction affecting left non-dominant side: Secondary | ICD-10-CM

## 2020-11-16 DIAGNOSIS — E43 Unspecified severe protein-calorie malnutrition: Secondary | ICD-10-CM | POA: Diagnosis present

## 2020-11-16 DIAGNOSIS — G14 Postpolio syndrome: Secondary | ICD-10-CM | POA: Diagnosis present

## 2020-11-16 DIAGNOSIS — R011 Cardiac murmur, unspecified: Secondary | ICD-10-CM | POA: Diagnosis present

## 2020-11-16 DIAGNOSIS — N1831 Chronic kidney disease, stage 3a: Secondary | ICD-10-CM | POA: Diagnosis present

## 2020-11-16 DIAGNOSIS — Z6822 Body mass index (BMI) 22.0-22.9, adult: Secondary | ICD-10-CM

## 2020-11-16 DIAGNOSIS — Z66 Do not resuscitate: Secondary | ICD-10-CM | POA: Diagnosis present

## 2020-11-16 DIAGNOSIS — L89152 Pressure ulcer of sacral region, stage 2: Secondary | ICD-10-CM | POA: Diagnosis present

## 2020-11-16 LAB — COMPREHENSIVE METABOLIC PANEL
ALT: 9 U/L (ref 0–44)
AST: 17 U/L (ref 15–41)
Albumin: 2.6 g/dL — ABNORMAL LOW (ref 3.5–5.0)
Alkaline Phosphatase: 75 U/L (ref 38–126)
Anion gap: 11 (ref 5–15)
BUN: 37 mg/dL — ABNORMAL HIGH (ref 8–23)
CO2: 25 mmol/L (ref 22–32)
Calcium: 10.1 mg/dL (ref 8.9–10.3)
Chloride: 100 mmol/L (ref 98–111)
Creatinine, Ser: 1.26 mg/dL — ABNORMAL HIGH (ref 0.61–1.24)
GFR, Estimated: 54 mL/min — ABNORMAL LOW (ref >60)
Glucose, Bld: 133 mg/dL — ABNORMAL HIGH (ref 70–99)
Potassium: 3.6 mmol/L (ref 3.5–5.1)
Sodium: 136 mmol/L (ref 135–145)
Total Bilirubin: 0.9 mg/dL (ref 0.3–1.2)
Total Protein: 5.1 g/dL — ABNORMAL LOW (ref 6.5–8.1)

## 2020-11-16 LAB — URINALYSIS, ROUTINE W REFLEX MICROSCOPIC
Bilirubin Urine: NEGATIVE
Glucose, UA: NEGATIVE mg/dL
Hgb urine dipstick: NEGATIVE
Ketones, ur: NEGATIVE mg/dL
Leukocytes,Ua: NEGATIVE
Nitrite: NEGATIVE
Protein, ur: 30 mg/dL — AB
Specific Gravity, Urine: 1.025 (ref 1.005–1.030)
pH: 5.5 (ref 5.0–8.0)

## 2020-11-16 LAB — CBC WITH DIFFERENTIAL/PLATELET
Abs Immature Granulocytes: 0.04 10*3/uL (ref 0.00–0.07)
Basophils Absolute: 0 10*3/uL (ref 0.0–0.1)
Basophils Relative: 0 %
Eosinophils Absolute: 0 10*3/uL (ref 0.0–0.5)
Eosinophils Relative: 1 %
HCT: 27 % — ABNORMAL LOW (ref 39.0–52.0)
Hemoglobin: 8.6 g/dL — ABNORMAL LOW (ref 13.0–17.0)
Immature Granulocytes: 1 %
Lymphocytes Relative: 8 %
Lymphs Abs: 0.5 10*3/uL — ABNORMAL LOW (ref 0.7–4.0)
MCH: 31.5 pg (ref 26.0–34.0)
MCHC: 31.9 g/dL (ref 30.0–36.0)
MCV: 98.9 fL (ref 80.0–100.0)
Monocytes Absolute: 1.4 10*3/uL — ABNORMAL HIGH (ref 0.1–1.0)
Monocytes Relative: 21 %
Neutro Abs: 4.6 10*3/uL (ref 1.7–7.7)
Neutrophils Relative %: 69 %
Platelets: 168 10*3/uL (ref 150–400)
RBC: 2.73 MIL/uL — ABNORMAL LOW (ref 4.22–5.81)
RDW: 15.9 % — ABNORMAL HIGH (ref 11.5–15.5)
WBC: 6.6 10*3/uL (ref 4.0–10.5)
nRBC: 0 % (ref 0.0–0.2)

## 2020-11-16 LAB — RESP PANEL BY RT-PCR (FLU A&B, COVID) ARPGX2
Influenza A by PCR: NEGATIVE
Influenza B by PCR: NEGATIVE
SARS Coronavirus 2 by RT PCR: NEGATIVE

## 2020-11-16 LAB — LIPASE, BLOOD: Lipase: 21 U/L (ref 11–51)

## 2020-11-16 LAB — URINALYSIS, MICROSCOPIC (REFLEX): Bacteria, UA: NONE SEEN

## 2020-11-16 LAB — POC OCCULT BLOOD, ED: Fecal Occult Bld: NEGATIVE

## 2020-11-16 LAB — TSH: TSH: 4.636 u[IU]/mL — ABNORMAL HIGH (ref 0.350–4.500)

## 2020-11-16 LAB — TROPONIN I (HIGH SENSITIVITY)
Troponin I (High Sensitivity): 16 ng/L (ref <18)
Troponin I (High Sensitivity): 17 ng/L (ref <18)

## 2020-11-16 LAB — T4, FREE: Free T4: 1.13 ng/dL — ABNORMAL HIGH (ref 0.61–1.12)

## 2020-11-16 MED ORDER — PANTOPRAZOLE SODIUM 40 MG IV SOLR
40.0000 mg | Freq: Once | INTRAVENOUS | Status: AC
  Administered 2020-11-16: 40 mg via INTRAVENOUS
  Filled 2020-11-16: qty 40

## 2020-11-16 MED ORDER — SODIUM CHLORIDE 0.9 % IV BOLUS
500.0000 mL | Freq: Once | INTRAVENOUS | Status: AC
  Administered 2020-11-16: 500 mL via INTRAVENOUS

## 2020-11-16 NOTE — ED Triage Notes (Signed)
Pt here from [facility] via [place]. Per facility pt has had increased sleepiness, decreased intake, and "not acting like himself". Pt is alert to self and location, disoriented to time. Denies any pain, pt has no complaints just "tried"

## 2020-11-16 NOTE — ED Provider Notes (Signed)
Emergency Medicine Provider Triage Evaluation Note  Charles Randall. , a 87 y.o. male  was evaluated in triage.  Pt complains of increased sleepiness for facility.  He has had decreased p.o. intake and is not acting like himself per facility.  He is awake and oriented to self and location, not to year.  He denies any pain.  He does report that he has hiccups.  He states that he feels well..  Review of Systems  Positive: Hiccups. Negative: Chest pain, abdominal pain  Physical Exam  BP 104/64 (BP Location: Right Arm)   Pulse 97   Temp 97.8 F (36.6 C) (Oral)   Resp 18   SpO2 95%  Gen:   Sleepy, awakens to voice Resp:  Normal effort  MSK:   Moves extremities without difficulty  Other:  Abdomen is soft nontender.  Medical Decision Making  Medically screening exam initiated at 4:21 PM.  Appropriate orders placed.  Charles Randall. was informed that the remainder of the evaluation will be completed by another provider, this initial triage assessment does not replace that evaluation, and the importance of remaining in the ED until their evaluation is complete.  Note: Portions of this report may have been transcribed using voice recognition software. Every effort was made to ensure accuracy; however, inadvertent computerized transcription errors may be present    Lorin Glass, PA-C 11/16/20 [place], [place], DO 11/16/20 2335

## 2020-11-16 NOTE — ED Provider Notes (Signed)
[facility] EMERGENCY DEPARTMENT Provider Note   CSN: [id number] Arrival date & time: 11/16/20  1547     History Chief Complaint  Patient presents with   Altered Mental Status    Charles Burrowes. is a 87 y.o. male.  This is a 87 y.o. 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  History provided by daughter at bedside.  She reports patient has been progressively becoming more weak over the past week.  Generalized malaise, poor appetite.  Intermittent cough which is nonproductive.  Not eating much.  No real change in bowel or bladder function although he does wear depends.  Denies black stool or blood per rectum.  Denies recent medication changes.  No recent falls.  Does have history of prior CVA with left-sided weakness, speech difficulty.  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  Resides at nursing facility.  No recent sick contacts that family or patient is aware of.  No Chest pain, dyspnea, abdominal pain, nausea or vomiting.  No headaches, fevers or chills.   The history is provided by the patient and a relative. The history is limited by the condition of the patient. No language interpreter was used.  Altered Mental Status Presenting symptoms: lethargy   Presenting symptoms: no behavior changes, no combativeness, no confusion, no disorientation, no memory loss, no partial responsiveness and no unresponsiveness   Severity:  Mild Episode history:  Continuous Timing:  Constant Progression:  Worsening Chronicity:  Chronic Context: nursing home resident   Context: not alcohol use and not head injury   Associated symptoms: weakness   Associated symptoms: no abdominal pain, no agitation, no difficulty breathing, no fever, no hallucinations, no headaches, no nausea, no palpitations, no rash and no vomiting   Weakness Severity:  Moderate Associated symptoms:  cough and lethargy   Associated symptoms: no abdominal pain, no chest pain, no fever, no headaches, no nausea, no shortness of breath and no vomiting       Past Medical History:  Diagnosis Date   Anemia    STARTED PILLS 1 MONTH AGO NEW DX   Aortic insufficiency    mild, moderate   Arthritis    FINGERS SWELL PT THINKS ARTHITIS, TOP OF WRIST ALSO SWELLS ONCE IN A WHILE   Bleeds easily (HCC)    BRUISE EASY ALSO   Chronic kidney disease    Cough    LAST 3 TO 4 WEEKS PHLEGM YELLOW INTERMITTENT COUGH NO FEVER   Dyslipidemia    H/O dizziness    OCC    H/O Doppler ultrasound 2010   H/O echocardiogram 2013   aortic valve disorder, EF =>55%   H/O echocardiogram 2012   AVD, EF =>55%   Heart murmur    Hemorrhagic stroke (HCC)    History of hiatal hernia    HTN (hypertension)    Inguinal hernia    Mitral regurgitation    mild   Pneumonia FEW YEARS AGO   X 1   Post-polio syndrome age 24   Seizures ([place])    last sz 2016 NO CAUSE FOUND, 3 seizures on 03/25/18    Swelling of both lower extremities    DOES NOT ALWAYS DO DOWN    Tricuspid regurgitation    mild    Patient Active Problem List   Diagnosis Date Noted   Seizures ([place]) 11/17/2020   Acute encephalopathy 11/17/2020   Tachy-brady syndrome (  [place]) 11/17/2020   Kidney lesion, native, left 11/17/2020   Hemorrhagic stroke ([place]) [id number]   Acute diastolic CHF (congestive heart failure) ([place])    History of hemiarthroplasty of left hip 02/13/2018   Closed fracture of neck of left femur ([place]) 02/12/2018   Moderate aortic regurgitation 02/12/2018   CKD (chronic kidney disease), stage III ([place]) 02/12/2018   Displaced fracture of left femoral neck ([place]) 02/12/2018   Left displaced femoral neck fracture ([place]) 02/12/2018   Arterial insufficiency of lower extremity ([place]) 11/22/2017   Bilateral inguinal hernia (BIH) s/p lap hernia repair w mesh 08/17/2017 08/17/2017   Femoral hernia - right - s/p lap hernia repair w mesh 08/17/2017  08/17/2017   Dysphagia 04/06/2016   Partial symptomatic epilepsy with complex partial seizures, not intractable, without status epilepticus ([place]) 11/16/2015   Normocytic anemia 10/13/2015   Seizure disorder ([place]) 05/12/2015   Edema of lower extremity 04/09/2015   Benign essential hypertension 04/09/2015   Pleural effusion, right 04/09/2015   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   Memory loss 04/07/2015   Mixed hyperlipidemia 04/07/2015   Pain of left thumb 04/07/2015   Simple partial seizure disorder ([place]) 04/07/2015   Skin lesion of right arm 04/07/2015   Transient alteration of awareness 04/07/2015    Past Surgical History:  Procedure Laterality Date   COLONSCOPY  5 YRS AGO   DENTAL SURGERY  3-4 WEEKS AGO   FEMORAL HERNIA REPAIR Right 08/17/2017   Procedure: HERNIA REPAIR FEMORAL LAPAROSCOPIC;  Surgeon: Michael Boston, MD;  Location: [facility];  Service: General;  Laterality: Right;   INGUINAL HERNIA REPAIR Bilateral 08/17/2017   Procedure: LAPAROSCOPIC BILATERAL INGUINAL HERNIA REPAIR;  Surgeon: Michael Boston, MD;  Location: [facility];  Service: General;  Laterality: Bilateral;   INSERTION OF MESH Bilateral 08/17/2017   Procedure: INSERTION OF MESH;  Surgeon: Michael Boston, MD;  Location: [facility];  Service: General;  Laterality: Bilateral;   TOTAL HIP ARTHROPLASTY Left 02/13/2018   Procedure: HEMI  ARTHROPLASTY;  Surgeon: Paralee Cancel, MD;  Location: [facility];  Service: Orthopedics;  Laterality: Left;       Family History  Problem Relation Age of Onset   Cancer Mother    Heart disease Father    Cancer Maternal Grandmother    Cancer Maternal Grandfather    Kidney failure Sister     Social History   Tobacco Use   Smoking status: Former    Packs/day: 0.50    Years: 15.00    Pack years: 7.50    Types: Cigarettes    Quit date: 03/01/1963    Years since quitting: 57.7   Smokeless tobacco: Never  Vaping Use   Vaping Use: Never used  Substance Use Topics   Alcohol use: No   Drug use:  No    Home Medications Prior to Admission medications   Medication Sig Start Date End Date Taking? Authorizing Provider  aspirin EC 81 MG tablet Take 1 tablet (81 mg total) by mouth every other day. 11/21/18  Yes Croitoru, Mihai, MD  carbamazepine (CARBATROL) 100 MG 12 hr capsule Take 1 capsule (100 mg total) by mouth 2 (two) times daily. 01/27/20  Yes Penumalli, Earlean Polka, MD  Cholecalciferol (VITAMIN D-3) 1000 UNITS CAPS Take 1,000 Units by mouth daily.    Yes [provider]  ezetimibe (ZETIA) 10 MG tablet TAKE 1 TABLET BY MOUTH ONCE DAILY. THIS IS TO REPLACE THE ATORVASTATIN Patient taking differently: Take 10 mg by mouth daily. 07/20/20  Yes Croitoru, Dani Gobble, MD  levETIRAcetam (  KEPPRA) 750 MG tablet Take 2 tablets (1,500 mg total) by mouth 2 (two) times daily. 01/27/20  Yes Penumalli, Earlean Polka, MD  magnesium oxide (MAG-OX) 400 MG tablet Take 400 mg by mouth daily.   Yes [provider]  NON FORMULARY Take 120 mLs by mouth 2 (two) times daily at 8am and 2pm. House supplement beverage drink   Yes [provider]  pantoprazole (PROTONIX) 40 MG tablet Take 1 tablet (40 mg total) by mouth daily. 08/26/20  Yes Kathie Dike, MD  triamcinolone (KENALOG) 0.147 MG/GM topical spray Apply 1 spray topically 2 (two) times daily as needed (rash).   Yes [provider]  vitamin B-12 (CYANOCOBALAMIN) 1000 MCG tablet Take 1,000 mcg by mouth daily.   Yes [provider]  vitamin C (ASCORBIC ACID) 500 MG tablet Take 1,000 mg by mouth daily.   Yes [provider]  vitamin E 400 UNIT capsule Take 400 Units by mouth daily.    Yes [provider]    Allergies    Patient has no known allergies.  Review of Systems   Review of Systems  Constitutional:  Positive for fatigue. Negative for chills and fever.  HENT:  Negative for facial swelling and trouble swallowing.   Eyes:  Negative for photophobia and visual disturbance.  Respiratory:  Positive for  cough. Negative for shortness of breath.   Cardiovascular:  Negative for chest pain and palpitations.  Gastrointestinal:  Negative for abdominal pain, nausea and vomiting.  Endocrine: Negative for polydipsia and polyuria.  Genitourinary:  Negative for difficulty urinating and hematuria.  Musculoskeletal:  Negative for gait problem and joint swelling.  Skin:  Negative for pallor and rash.  Neurological:  Positive for weakness. Negative for syncope and headaches.  Psychiatric/Behavioral:  Negative for agitation, confusion, hallucinations and memory loss.    Physical Exam Updated Vital Signs BP (!) 103/59   Pulse 94   Temp 97.8 F (36.6 C) (Oral)   Resp (!) 28   SpO2 97%   Physical Exam Vitals and nursing note reviewed. Exam conducted with a chaperone present.  Constitutional:      General: He is not in acute distress.    Appearance: He is well-developed.     Comments: Pale appearing, frail  HENT:     Head: Normocephalic and atraumatic.     Right Ear: External ear normal.     Left Ear: External ear normal.     Mouth/Throat:     Mouth: Mucous membranes are moist.  Eyes:     General: No scleral icterus.    Extraocular Movements: Extraocular movements intact.     Pupils: Pupils are equal, round, and reactive to light.  Cardiovascular:     Rate and Rhythm: Normal rate and regular rhythm.     Pulses: Normal pulses.     Heart sounds: Normal heart sounds.  Pulmonary:     Effort: Pulmonary effort is normal. No respiratory distress.     Breath sounds: Normal breath sounds.  Abdominal:     General: Abdomen is flat.     Palpations: Abdomen is soft.     Tenderness: There is no abdominal tenderness.  Genitourinary:    Comments: No frank bleeding, no internal/external hemorrhoids palpated.  No fissures.  Stool in rectal vault. Musculoskeletal:        General: Normal range of motion.     Cervical back: Normal range of motion.     Right lower leg: No edema.     Left lower  leg: No  edema.  Skin:    General: Skin is warm and dry.     Capillary Refill: Capillary refill takes less than 2 seconds.  Neurological:     Mental Status: He is alert and oriented to person, place, and time.     GCS: GCS eye subscore is 4. GCS verbal subscore is 5. GCS motor subscore is 6.     Cranial Nerves: Facial asymmetry present.     Sensory: Sensation is intact.     Motor: Motor function is intact. No tremor.     Coordination: Coordination is intact.     Comments: left sided facial droop, residual from prior CVA per daughter at bedside.   Psychiatric:        Mood and Affect: Mood normal.        Behavior: Behavior normal.    ED Results / Procedures / Treatments   Labs (all labs ordered are listed, but only abnormal results are displayed) Labs Reviewed  COMPREHENSIVE METABOLIC PANEL - Abnormal; Notable for the following components:      Result Value   Glucose, Bld 133 (*)    BUN 37 (*)    Creatinine, Ser 1.26 (*)    Total Protein 5.1 (*)    Albumin 2.6 (*)    GFR, Estimated 54 (*)    All other components within normal limits  CBC WITH DIFFERENTIAL/PLATELET - Abnormal; Notable for the following components:   RBC 2.73 (*)    Hemoglobin 8.6 (*)    HCT 27.0 (*)    RDW 15.9 (*)    Lymphs Abs 0.5 (*)    Monocytes Absolute 1.4 (*)    All other components within normal limits  URINALYSIS, ROUTINE W REFLEX MICROSCOPIC - Abnormal; Notable for the following components:   Protein, ur 30 (*)    All other components within normal limits  TSH - Abnormal; Notable for the following components:   TSH 4.636 (*)    All other components within normal limits  T4, FREE - Abnormal; Notable for the following components:   Free T4 1.13 (*)    All other components within normal limits  CBC - Abnormal; Notable for the following components:   RBC 2.61 (*)    Hemoglobin 8.3 (*)    HCT 25.8 (*)    RDW 15.9 (*)    All other components within normal limits  BASIC METABOLIC PANEL - Abnormal; Notable  for the following components:   Glucose, Bld 116 (*)    BUN 36 (*)    Calcium 10.4 (*)    All other components within normal limits  CARBAMAZEPINE LEVEL, TOTAL - Abnormal; Notable for the following components:   Carbamazepine Lvl 2.6 (*)    All other components within normal limits  RESP PANEL BY RT-PCR (FLU A&B, COVID) ARPGX2  LIPASE, BLOOD  URINALYSIS, MICROSCOPIC (REFLEX)  MAGNESIUM  CBC  BASIC METABOLIC PANEL  VITAMIN 123456  FOLATE  IRON AND TIBC  FERRITIN  RETICULOCYTES  POC OCCULT BLOOD, ED  TROPONIN I (HIGH SENSITIVITY)  TROPONIN I (HIGH SENSITIVITY)    EKG EKG Interpretation  Date/Time:  Monday November 16 2020 16:01:43 EDT Ventricular Rate:  90 PR Interval:  210 QRS Duration: 66 QT Interval:  514 QTC Calculation: 628 R Axis:   -10 Text Interpretation: Sinus rhythm with marked sinus arrhythmia with 1st degree A-V block with Fusion complexes Anterior infarct , age undetermined Abnormal ECG Interpretation limited secondary to artifact Similar to prior tracings Confirmed by Wynona Dove (  [id number]) on 11/16/2020 8:58:54 PM  Radiology DG Chest 2 View  Result Date: 11/16/2020 CLINICAL DATA:  87 year old male with history of weakness and altered mental status for 1 day. EXAM: CHEST - 2 VIEW COMPARISON:  Chest x-ray 08/21/2020. FINDINGS: Lung volumes are normal. No consolidative airspace disease. No pleural effusions. No pneumothorax. No pulmonary nodule or mass noted. Pulmonary vasculature and the cardiomediastinal silhouette are within normal limits. Atherosclerosis in the thoracic aorta. IMPRESSION: 1.  No radiographic evidence of acute cardiopulmonary disease. 2. Aortic atherosclerosis. Electronically Signed   By: Vinnie Langton M.D.   On: 11/16/2020 19:06   CT HEAD WO CONTRAST (5MM)  Result Date: 11/16/2020 CLINICAL DATA:  87 year old male with history of delirium. EXAM: CT HEAD WITHOUT CONTRAST TECHNIQUE: Contiguous axial images were obtained from the base of the skull  through the vertex without intravenous contrast. COMPARISON:  Head CT 10/29/2020. FINDINGS: Brain: Moderate cerebral atrophy. Patchy and confluent areas of decreased attenuation are noted throughout the deep and periventricular white matter of the cerebral hemispheres bilaterally, compatible with chronic microvascular ischemic disease. More well-defined low-attenuation in the right basal ganglia and left thalamus, compatible with old lacunar infarcts. No evidence of acute infarction, hemorrhage, hydrocephalus, extra-axial collection or mass lesion/mass effect. Vascular: No hyperdense vessel or unexpected calcification. Skull: Normal. Negative for fracture or focal lesion. Sinuses/Orbits: Extensive mucoperiosteal thickening noted in the left sphenoid sinus where there is also some air-fluid levels. Paranasal sinuses are otherwise well pneumatized. Bilateral mastoids are well pneumatized. Other: None. IMPRESSION: 1. No acute intracranial abnormalities. 2. Moderate cerebral atrophy with extensive chronic microvascular ischemic changes and multiple old lacunar infarcts in the basal ganglia bilaterally. 3. Sinusitis in the left sphenoid sinus with both chronic and potentially acute features. Clinical correlation for signs and symptoms of acute sinusitis is recommended. Electronically Signed   By: Vinnie Langton M.D.   On: 11/16/2020 19:42   MR ABDOMEN W WO CONTRAST  Result Date: 11/17/2020 CLINICAL DATA:  87 year old male with suspected renal mass. EXAM: MRI ABDOMEN WITHOUT AND WITH CONTRAST TECHNIQUE: Multiplanar multisequence MR imaging of the abdomen was performed both before and after the administration of intravenous contrast. CONTRAST:  4.74m GADAVIST GADOBUTROL 1 MMOL/ML IV SOLN COMPARISON:  Comparison made with CT angiography of the chest, abdomen and pelvis from Jul 19, 2020. FINDINGS: Lower chest: Small effusions in the lung bases. No dense consolidative process. Limited assessment on MRI of this area.  Hepatobiliary: No focal, suspicious hepatic lesion. No pericholecystic stranding. Cholelithiasis. No biliary duct dilation. Pancreas: Signs of pancreatic atrophy, no gross ductal dilation or inflammation. Mildly limited assessment due to respiratory motion. Spleen:  Normal size and contour without focal lesion. Adrenals/Urinary Tract:  Adrenal glands are normal. Symmetric renal enhancement. Perirenal and retroperitoneal nodules in the LEFT retroperitoneum largest 2.0 x 1.3 cm (image 35/19) RIGHT retroperitoneal nodules and masses inseparable from the RIGHT psoas (image 60/19) weakly enhancing and measuring 6.1 x 3.7 cm in this area. Larger RIGHT retroperitoneal mass displaces the colon measuring 7.8 x 4.8 cm and is incompletely imaged on the current study except in the coronal plane where it tracks along the iliopsoas musculature encompassing approximally 14 cm of the retroperitoneum in craniocaudal extent. No hydronephrosis despite these findings.  Renal vein is patent. Stomach/Bowel: Limited assessment of gastrointestinal structures without acute process. Displacement of the colon in the RIGHT abdomen due to retroperitoneal masses. Vascular/Lymphatic: No pathologically enlarged lymph nodes identified. No abdominal aortic aneurysm demonstrated. Aortic atherosclerosis. Other:  No ascites. Musculoskeletal: No suspicious bone  lesions identified. IMPRESSION: Retroperitoneal masses and nodules greatest on the RIGHT, findings are suspicious for lymphoproliferative disorder primarily though are of uncertain significance and are hypovascular based on current MR assessment. Metastatic process is also considered. No solid organ involvement, retroperitoneal adenopathy, discrete lymph nodes in the retroperitoneum or signs of splenic enlargement. Constellation of findings potentially confounding when considering the differential. Ultimately biopsy may be necessary for further evaluation as warranted. PET could also be  considered on follow-up to assess for additional sites of disease or potential site of primary neoplasm. Cholelithiasis. Small effusions in the lung bases. Electronically Signed   By: Zetta Bills M.D.   On: 11/17/2020 13:16   EEG adult  Result Date: 11/17/2020 Lora Havens, MD     11/17/2020 12:15 PM Patient Name: Charles Randall. MRN: [id number] Epilepsy Attending: Lora Havens Referring Physician/Provider: Dr Lesleigh Noe Date: 11/17/2020 Duration: 26.13 mins Patient history: 87yo m with h/o seizure now with worsening mental status and possible tongue bite. EEG to evaluate for seizure Level of alertness: Awake, asleep AEDs during EEG study: CBZ, LEV Technical aspects: This EEG study was done with scalp electrodes positioned according to the 10-20 International system of electrode placement. Electrical activity was acquired at a sampling rate of '500Hz'$  and reviewed with a high frequency filter of '70Hz'$  and a low frequency filter of '1Hz'$ . EEG data were recorded continuously and digitally stored. Description: The posterior dominant rhythm consists of 8-'9Hz'$  activity of moderate voltage (25-35 uV) seen predominantly in posterior head regions, symmetric and reactive to eye opening and eye closing. Sleep was characterized by vertex waves, sleep spindles (12 to 14 Hz), maximal frontocentral region.  EEG showed intermittent generalized 3 to 6 Hz theta-delta slowing. Hyperventilation and photic stimulation were not performed.   ABNORMALITY - Intermittent slow, generalized IMPRESSION: This study suggestive of mild diffuse encephalopathy, nonspecific etiology. No seizures or epileptiform discharges were seen throughout the recording. Lora Havens    Procedures Procedures   Medications Ordered in ED Medications  acetaminophen (TYLENOL) tablet 650 mg (has no administration in time range)    Or  acetaminophen (TYLENOL) suppository 650 mg (has no administration in time range)  ondansetron (ZOFRAN)  tablet 4 mg (has no administration in time range)    Or  ondansetron (ZOFRAN) injection 4 mg (has no administration in time range)  levETIRAcetam (KEPPRA) IVPB 1500 mg/ 100 mL premix (1,500 mg Intravenous New Bag/Given 11/17/20 1903)  phenytoin (DILANTIN) injection 50 mg (50 mg Intravenous Not Given 11/17/20 1324)  0.9 %  sodium chloride infusion ( Intravenous New Bag/Given 11/17/20 1628)  sodium chloride 0.9 % bolus 500 mL (0 mLs Intravenous Stopped 11/17/20 0002)  pantoprazole (PROTONIX) injection 40 mg (40 mg Intravenous Given 11/16/20 2159)  gadobutrol (GADAVIST) 1 MMOL/ML injection 4.5 mL (4.5 mLs Intravenous Contrast Given 11/17/20 1236)    ED Course  I have reviewed the triage vital signs and the nursing notes.  Pertinent labs & imaging results that were available during my care of the patient were reviewed by me and considered in my medical decision making (see chart for details).    MDM Rules/Calculators/A&P                         This patient complains of weakness, fatigue; this involves an extensive number of treatment Options and is a complaint that carries with it a high risk of complications and Morbidity. Vital signs reviewed and are stable. Serious etiologies considered.  Patient with gradually worsening fatigue, lethargy, weakness over the past couple weeks. Poor oral intake, reduced mobility, generalized malaise and cough.  Labs reviewed, pt with 2g hemoglobin drop since prior eval 3 mos ago. Baseline 10-12. Today 8.6. Hemoccult negative. Pt does have some dried blood to his oropharnyx but family reports that he sometimes bites his tongue. No obvious wound on exam. Hx seizures. No seizure activity in ED. Denies emesis or hematemesis. Normocytic anemia.   Imaging reviewed by myself, CTH with chronic changes a/w old infarcts. No acute changes. CXR stable.  I personally reviewed the patient's cardiac telemetry. Patient with alternating tachycardia/bradycardia on telemetry. HR  increased to 140-150's, does appear regular. Low voltage though. He will periodically dip into the 40-50 range.   Asymptomatic during this time. Concern for possible tachy/brady, sick sinus. Will d/w cardiology. Cardiology recommends admission and cardiology evaluation tomorrow am.   Pt with symptomatic anemia. Negative hemoccult. Normocytic anemia. Given protonix. Given age and co-morbidities it is reasonable to admit patient for hematology evaluation, cardiology evaluation. D/w admission team who accepts patient.    Final Clinical Impression(s) / ED Diagnoses Final diagnoses:  Symptomatic anemia  Sick sinus syndrome ([place])  Other fatigue    Rx / DC Orders ED Discharge Orders     None        Jeanell Sparrow, DO 11/17/20 1958

## 2020-11-17 ENCOUNTER — Observation Stay (HOSPITAL_COMMUNITY): Payer: Medicare Other

## 2020-11-17 DIAGNOSIS — I10 Essential (primary) hypertension: Secondary | ICD-10-CM

## 2020-11-17 DIAGNOSIS — Z6822 Body mass index (BMI) 22.0-22.9, adult: Secondary | ICD-10-CM | POA: Diagnosis not present

## 2020-11-17 DIAGNOSIS — Z66 Do not resuscitate: Secondary | ICD-10-CM | POA: Diagnosis present

## 2020-11-17 DIAGNOSIS — H4902 Third [oculomotor] nerve palsy, left eye: Secondary | ICD-10-CM | POA: Diagnosis present

## 2020-11-17 DIAGNOSIS — Z87891 Personal history of nicotine dependence: Secondary | ICD-10-CM | POA: Diagnosis not present

## 2020-11-17 DIAGNOSIS — E785 Hyperlipidemia, unspecified: Secondary | ICD-10-CM | POA: Diagnosis present

## 2020-11-17 DIAGNOSIS — R569 Unspecified convulsions: Secondary | ICD-10-CM

## 2020-11-17 DIAGNOSIS — E43 Unspecified severe protein-calorie malnutrition: Secondary | ICD-10-CM | POA: Diagnosis present

## 2020-11-17 DIAGNOSIS — R64 Cachexia: Secondary | ICD-10-CM | POA: Diagnosis present

## 2020-11-17 DIAGNOSIS — G9340 Encephalopathy, unspecified: Secondary | ICD-10-CM | POA: Diagnosis not present

## 2020-11-17 DIAGNOSIS — D649 Anemia, unspecified: Secondary | ICD-10-CM | POA: Diagnosis not present

## 2020-11-17 DIAGNOSIS — Z20822 Contact with and (suspected) exposure to covid-19: Secondary | ICD-10-CM | POA: Diagnosis present

## 2020-11-17 DIAGNOSIS — N289 Disorder of kidney and ureter, unspecified: Secondary | ICD-10-CM | POA: Diagnosis not present

## 2020-11-17 DIAGNOSIS — I69354 Hemiplegia and hemiparesis following cerebral infarction affecting left non-dominant side: Secondary | ICD-10-CM | POA: Diagnosis not present

## 2020-11-17 DIAGNOSIS — C8599 Non-Hodgkin lymphoma, unspecified, extranodal and solid organ sites: Secondary | ICD-10-CM | POA: Diagnosis present

## 2020-11-17 DIAGNOSIS — Z515 Encounter for palliative care: Secondary | ICD-10-CM | POA: Diagnosis not present

## 2020-11-17 DIAGNOSIS — Z96642 Presence of left artificial hip joint: Secondary | ICD-10-CM | POA: Diagnosis present

## 2020-11-17 DIAGNOSIS — N179 Acute kidney failure, unspecified: Secondary | ICD-10-CM | POA: Diagnosis present

## 2020-11-17 DIAGNOSIS — N1831 Chronic kidney disease, stage 3a: Secondary | ICD-10-CM | POA: Diagnosis present

## 2020-11-17 DIAGNOSIS — G40909 Epilepsy, unspecified, not intractable, without status epilepticus: Secondary | ICD-10-CM

## 2020-11-17 DIAGNOSIS — I69392 Facial weakness following cerebral infarction: Secondary | ICD-10-CM | POA: Diagnosis not present

## 2020-11-17 DIAGNOSIS — G9341 Metabolic encephalopathy: Secondary | ICD-10-CM | POA: Diagnosis present

## 2020-11-17 DIAGNOSIS — I619 Nontraumatic intracerebral hemorrhage, unspecified: Secondary | ICD-10-CM | POA: Diagnosis not present

## 2020-11-17 DIAGNOSIS — D638 Anemia in other chronic diseases classified elsewhere: Secondary | ICD-10-CM | POA: Diagnosis present

## 2020-11-17 DIAGNOSIS — I495 Sick sinus syndrome: Secondary | ICD-10-CM

## 2020-11-17 DIAGNOSIS — Z8249 Family history of ischemic heart disease and other diseases of the circulatory system: Secondary | ICD-10-CM | POA: Diagnosis not present

## 2020-11-17 DIAGNOSIS — L89152 Pressure ulcer of sacral region, stage 2: Secondary | ICD-10-CM | POA: Diagnosis present

## 2020-11-17 DIAGNOSIS — I083 Combined rheumatic disorders of mitral, aortic and tricuspid valves: Secondary | ICD-10-CM | POA: Diagnosis present

## 2020-11-17 DIAGNOSIS — L89302 Pressure ulcer of unspecified buttock, stage 2: Secondary | ICD-10-CM | POA: Diagnosis not present

## 2020-11-17 DIAGNOSIS — G14 Postpolio syndrome: Secondary | ICD-10-CM | POA: Diagnosis present

## 2020-11-17 DIAGNOSIS — I129 Hypertensive chronic kidney disease with stage 1 through stage 4 chronic kidney disease, or unspecified chronic kidney disease: Secondary | ICD-10-CM | POA: Diagnosis present

## 2020-11-17 LAB — MAGNESIUM: Magnesium: 1.8 mg/dL (ref 1.7–2.4)

## 2020-11-17 LAB — CBC
HCT: 25.8 % — ABNORMAL LOW (ref 39.0–52.0)
Hemoglobin: 8.3 g/dL — ABNORMAL LOW (ref 13.0–17.0)
MCH: 31.8 pg (ref 26.0–34.0)
MCHC: 32.2 g/dL (ref 30.0–36.0)
MCV: 98.9 fL (ref 80.0–100.0)
Platelets: 159 10*3/uL (ref 150–400)
RBC: 2.61 MIL/uL — ABNORMAL LOW (ref 4.22–5.81)
RDW: 15.9 % — ABNORMAL HIGH (ref 11.5–15.5)
WBC: 6.5 10*3/uL (ref 4.0–10.5)
nRBC: 0 % (ref 0.0–0.2)

## 2020-11-17 LAB — BASIC METABOLIC PANEL
Anion gap: 12 (ref 5–15)
BUN: 36 mg/dL — ABNORMAL HIGH (ref 8–23)
CO2: 25 mmol/L (ref 22–32)
Calcium: 10.4 mg/dL — ABNORMAL HIGH (ref 8.9–10.3)
Chloride: 99 mmol/L (ref 98–111)
Creatinine, Ser: 1.09 mg/dL (ref 0.61–1.24)
GFR, Estimated: >60 mL/min (ref >60)
Glucose, Bld: 116 mg/dL — ABNORMAL HIGH (ref 70–99)
Potassium: 3.6 mmol/L (ref 3.5–5.1)
Sodium: 136 mmol/L (ref 135–145)

## 2020-11-17 LAB — CARBAMAZEPINE LEVEL, TOTAL: Carbamazepine Lvl: 2.6 ug/mL — ABNORMAL LOW (ref 4.0–12.0)

## 2020-11-17 MED ORDER — PHENYTOIN SODIUM 50 MG/ML IJ SOLN
50.0000 mg | Freq: Three times a day (TID) | INTRAMUSCULAR | Status: DC
  Administered 2020-11-17 – 2020-11-18 (×3): 50 mg via INTRAVENOUS
  Filled 2020-11-17 (×5): qty 1

## 2020-11-17 MED ORDER — ONDANSETRON HCL 4 MG/2ML IJ SOLN: 4.0000 mg | Freq: Four times a day (QID) | INTRAMUSCULAR | Status: DC | PRN

## 2020-11-17 MED ORDER — ACETAMINOPHEN 325 MG PO TABS: 650.0000 mg | ORAL_TABLET | Freq: Four times a day (QID) | ORAL | Status: DC | PRN

## 2020-11-17 MED ORDER — LEVETIRACETAM 500 MG PO TABS
1500.0000 mg | ORAL_TABLET | Freq: Two times a day (BID) | ORAL | Status: DC
  Filled 2020-11-17: qty 3

## 2020-11-17 MED ORDER — SODIUM CHLORIDE 0.9 % IV SOLN: INTRAVENOUS | Status: DC

## 2020-11-17 MED ORDER — SODIUM CHLORIDE 0.9 % IV SOLN: 500.0000 mg | Freq: Three times a day (TID) | INTRAVENOUS | Status: DC

## 2020-11-17 MED ORDER — PANTOPRAZOLE SODIUM 40 MG PO TBEC: 40.0000 mg | DELAYED_RELEASE_TABLET | Freq: Every day | ORAL | Status: DC

## 2020-11-17 MED ORDER — ASPIRIN EC 81 MG PO TBEC: 81.0000 mg | DELAYED_RELEASE_TABLET | ORAL | Status: DC

## 2020-11-17 MED ORDER — GADOBUTROL 1 MMOL/ML IV SOLN
4.5000 mL | Freq: Once | INTRAVENOUS | Status: AC | PRN
  Administered 2020-11-17: 4.5 mL via INTRAVENOUS

## 2020-11-17 MED ORDER — LEVETIRACETAM IN NACL 1500 MG/100ML IV SOLN
1500.0000 mg | Freq: Two times a day (BID) | INTRAVENOUS | Status: DC
  Administered 2020-11-17 – 2020-11-18 (×3): 1500 mg via INTRAVENOUS
  Filled 2020-11-17 (×4): qty 100

## 2020-11-17 MED ORDER — ONDANSETRON HCL 4 MG PO TABS: 4.0000 mg | ORAL_TABLET | Freq: Four times a day (QID) | ORAL | Status: DC | PRN

## 2020-11-17 MED ORDER — ACETAMINOPHEN 650 MG RE SUPP: 650.0000 mg | Freq: Four times a day (QID) | RECTAL | Status: DC | PRN

## 2020-11-17 MED ORDER — EZETIMIBE 10 MG PO TABS: 10.0000 mg | ORAL_TABLET | Freq: Every day | ORAL | Status: DC

## 2020-11-17 MED ORDER — CARBAMAZEPINE ER 100 MG PO TB12
100.0000 mg | ORAL_TABLET | Freq: Two times a day (BID) | ORAL | Status: DC
  Filled 2020-11-17 (×2): qty 1

## 2020-11-17 MED ORDER — MAGNESIUM OXIDE -MG SUPPLEMENT 400 (240 MG) MG PO TABS: 400.0000 mg | ORAL_TABLET | Freq: Every day | ORAL | Status: DC

## 2020-11-17 NOTE — Progress Notes (Signed)
EEG complete - results pending 

## 2020-11-17 NOTE — ED Notes (Signed)
Patient transported to MRI 

## 2020-11-17 NOTE — Evaluation (Signed)
Clinical/Bedside Swallow Evaluation Patient Details  Name: Charles Randall. MRN: [id number] Date of Birth: 1927-12-06  Today's Date: 11/17/2020 Time: SLP Start Time (ACUTE ONLY): 42 SLP Stop Time (ACUTE ONLY): 1100 SLP Time Calculation (min) (ACUTE ONLY): 30 min  Past Medical History:  Past Medical History:  Diagnosis Date   Anemia    STARTED PILLS 1 MONTH AGO NEW DX   Aortic insufficiency    mild, moderate   Arthritis    FINGERS SWELL PT THINKS ARTHITIS, TOP OF WRIST ALSO SWELLS ONCE IN A WHILE   Bleeds easily (HCC)    BRUISE EASY ALSO   Chronic kidney disease    Cough    LAST 3 TO 4 WEEKS PHLEGM YELLOW INTERMITTENT COUGH NO FEVER   Dyslipidemia    H/O dizziness    OCC    H/O Doppler ultrasound 2010   H/O echocardiogram 2013   aortic valve disorder, EF =>55%   H/O echocardiogram 2012   AVD, EF =>55%   Heart murmur    Hemorrhagic stroke (HCC)    History of hiatal hernia    HTN (hypertension)    Inguinal hernia    Mitral regurgitation    mild   Pneumonia FEW YEARS AGO   X 1   Post-polio syndrome age 34   Seizures ([place])    last sz 2016 NO CAUSE FOUND, 3 seizures on 03/25/18    Swelling of both lower extremities    DOES NOT ALWAYS DO DOWN    Tricuspid regurgitation    mild   Past Surgical History:  Past Surgical History:  Procedure Laterality Date   COLONSCOPY  5 YRS AGO   DENTAL SURGERY  3-4 WEEKS AGO   FEMORAL HERNIA REPAIR Right 08/17/2017   Procedure: HERNIA REPAIR FEMORAL LAPAROSCOPIC;  Surgeon: Michael Boston, MD;  Location: [facility];  Service: General;  Laterality: Right;   INGUINAL HERNIA REPAIR Bilateral 08/17/2017   Procedure: LAPAROSCOPIC BILATERAL INGUINAL HERNIA REPAIR;  Surgeon: Michael Boston, MD;  Location: [facility];  Service: General;  Laterality: Bilateral;   INSERTION OF MESH Bilateral 08/17/2017   Procedure: INSERTION OF MESH;  Surgeon: Michael Boston, MD;  [facility]: [facility];  Service: General;  Laterality: Bilateral;   TOTAL HIP ARTHROPLASTY Left  02/13/2018   Procedure: HEMI  ARTHROPLASTY;  Surgeon: Paralee Cancel, MD;  Location: [facility];  Service: Orthopedics;  Laterality: Left;   HPI:  87 y.o. male presented to ED with acute encephalopathy, ?tachy-brady syndrome, anemia.  Failed [facility] swallow screen. Concerns for blood in oropharynx due to biting tongue. 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    Assessment / Plan / Recommendation  Clinical Impression  Pt presented with quite functional oropharyngeal swallowing.  He assisted in his own oral care, and we removed dried bloody secretions from tongue.  Initial swallow of water elicited a cough and some expectoration of mucus, but once oral cavity was clear, he demonstrated no further coughing.  He was able to masticate a portion of a [place] sandwich, applesauce, and drank water with no overt s/s of aspiration.  He was pleasant and interactive. His daughter was present for assessment.  Recommend resuming a regular consistency diet, thin liquids; give meds whole in puree for ease of swallowing.  No further SLP f/u is needed - our service will sign off. SLP Visit Diagnosis: Dysphagia, unspecified (R13.10)  Aspiration Risk  No limitations    Diet Recommendation   Regular solids, thin liquids  Medication Administration: Whole meds with puree    Other  Recommendations Oral Care Recommendations: Oral care BID    Recommendations for follow up therapy are one component of a multi-disciplinary discharge planning process, led by the attending physician.  Recommendations may be updated based on patient status, additional functional criteria and insurance authorization.  Follow up Recommendations None        Swallow Study   General HPI: 87 y.o. male presented to ED with acute encephalopathy, ?tachy-brady syndrome, anemia.  Failed [facility] swallow  screen. Concerns for blood in oropharynx due to biting tongue. 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 Type of Study: Bedside Swallow Evaluation Previous Swallow Assessment: dec 2019 Diet Prior to this Study: NPO Temperature Spikes Noted: No Respiratory Status: Room air History of Recent Intubation: No Behavior/Cognition: Alert;Cooperative;Pleasant mood Oral Cavity Assessment: Dried secretions Oral Care Completed by SLP: Yes Oral Cavity - Dentition: Adequate natural dentition Vision: Functional for self-feeding Self-Feeding Abilities: Needs assist Patient Positioning: Upright in bed Baseline Vocal Quality: Normal Volitional Cough: Strong Volitional Swallow: Able to elicit    Oral/Motor/Sensory Function Overall Oral Motor/Sensory Function: Within functional limits   Ice Chips Ice chips: Within functional limits   Thin Liquid Thin Liquid: Within functional limits    Nectar Thick Nectar Thick Liquid: Not tested   Honey Thick Honey Thick Liquid: Not tested   Puree Puree: Within functional limits   Solid     Solid: Within functional limits      [name] 11/17/2020,11:23 AM Estill Bamberg L. Tivis Ringer, [place] Office number [phone] Pager [phone]

## 2020-11-17 NOTE — Progress Notes (Addendum)
Patient seen and examined, admitted earlier this morning by Dr. Alcario Drought -Briefly 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 -History of frequent falls in May, subsequently diagnosed with CVA in May/June 22, had a right basal ganglia subacute hemorrhagic infarct then -Was brought to the ED by family yesterday evening on account of progressive weakness, decreased oral oral intake, sleeping most of the time, less interactive, overall failure to thrive they noticed over the last 1 to 2 weeks.  has also been using a wheelchair over the last couple of months -In the emergency room fluctuations in blood pressure noted ranging from 40, 50s up to 130 at times, hemoglobin was 8.6, lower than baseline, CT head noted moderate cerebral atrophy, WBC was normal, electrolytes unremarkable, urinalysis and chest x-ray were unrevealing as well, TSH and free T4 were minimally elevated -Was seen by neurology in consultation recommended to obtain MRI brain and abdomen pelvis, EEG  Failure to thrive -Noted to have a functional decline over the last couple of months -Does not appear encephalopathic to me this morning, sleeping when I arrived but awakens to voice and answers questions appropriately -Etiology is unclear at this time, no medication changes noted, history not suggestive of breakthrough seizures, await EEG for completeness, labs, UA/CXR unremarkable -CT head noted moderate atrophy -Neuro consulting -Has known left kidney lesion concerning for malignancy, MRI brain and abdomen ordered on admission to evaluate for malignancy, metastasis -SLP evaluation -PT OT eval -If above work-up is unremarkable will need palliative care consultation, discussed this with daughter who agrees  ?  Tachybradycardia -Initially upon arrival was  noted to be bradycardic, heart rate in the 90-1 03 range for the last 12 hours -Monitor on telemetry  History of seizure disorder -Per daughter this was remote, continue Keppra, changed to IV, resume Tegretol after swallowing cleared by SLP  Left renal lesion -Concerning for malignancy based on imaging in May, MRI abdomen ordered by admitting MD, will follow up, he will not be a good candidate for surgery  History of CVA with hemorrhagic conversion in June -On baby aspirin at baseline, continued  Normocytic anemia -Hemoglobin lower by 2 g from 3 months ago, Hemoccult negative in the ED -Check anemia panel  DVT prophylaxis: SCDs CODE STATUS: DNR  Discussed with significant other Charles Randall at bedside, daughter Charles Randall on the phone

## 2020-11-17 NOTE — Care Plan (Signed)
Patient was asleep, woke up to verbal stimulation, oriented to person, place, not to time. Follows commands, no e/o aphasia. Antigravity strength in all extremities R>L ( left hemisparesis due to stroke and polio)  Per daughter, before admission in end of June, patient was able to walk with walker, mow his lawn. Has been gradually worsening and now predominantly bed bound.   Plan - Ordering IV dilantin 50mg  Q8h. Will also order swallow eval. If passes, can dc phenytoin and resume home carbamazepine 100mg  BID -MRI pending.  - Updated patient's daughter at bedside and another daughter was on phone. - of note, daughters are POA and would like to be contacted for any healthcare decisions and be notified of any updates. They are ok with patient's girlfriend visiting and getting updates but not to be a part of decision making.  Emrah Ariola Barbra Sarks

## 2020-11-17 NOTE — Discharge Planning (Signed)
Pt currently active with [facility] for Home Health services as confirmed by [facility] with Amy of [facility].  Amy will follow pt for discharge planning.

## 2020-11-17 NOTE — Procedures (Signed)
Patient Name: Charles Randall.  MRN: [id number]  Epilepsy Attending: Lora Havens  Referring Physician/Provider: Dr Lesleigh Noe Date: 11/17/2020 Duration: 26.13 mins  Patient history: 87yo m with h/o seizure now with worsening mental status and possible tongue bite. EEG to evaluate for seizure  Level of alertness: Awake, asleep  AEDs during EEG study: CBZ, LEV  Technical aspects: This EEG study was done with scalp electrodes positioned according to the 10-20 International system of electrode placement. Electrical activity was acquired at a sampling rate of 500Hz  and reviewed with a high frequency filter of 70Hz  and a low frequency filter of 1Hz . EEG data were recorded continuously and digitally stored.   Description: The posterior dominant rhythm consists of 8-9Hz  activity of moderate voltage (25-35 uV) seen predominantly in posterior head regions, symmetric and reactive to eye opening and eye closing. Sleep was characterized by vertex waves, sleep spindles (12 to 14 Hz), maximal frontocentral region.  EEG showed intermittent generalized 3 to 6 Hz theta-delta slowing. Hyperventilation and photic stimulation were not performed.     ABNORMALITY - Intermittent slow, generalized  IMPRESSION: This study suggestive of mild diffuse encephalopathy, nonspecific etiology. No seizures or epileptiform discharges were seen throughout the recording.  [name]

## 2020-11-17 NOTE — ED Notes (Signed)
Pt given a sip of water then immediately produced coughing, Dr. Alcario Drought notified, Pt now NPO

## 2020-11-17 NOTE — ED Notes (Signed)
Tele-tracking in

## 2020-11-17 NOTE — H&P (Addendum)
History and Physical    Charles Randall. ZJI:[id number] DOB: 1927-10-26 DOA: 11/16/2020  PCP: Nickola Major, MD  Patient coming from: SNF  I have personally briefly reviewed patient's old medical records in [place]  Chief Complaint: AMS  HPI: Charles Buer. is a 86 y.o. male with medical history significant of hemorrhagic stroke in June, seizure disorder managed by outpt neurology with AEDs.  Pt has chronic L  weakness due to Post polio syndrome.  L facial droop since stroke.  Pt presents to ED, daughter reports pt progressively more weak over the past week.  Poor PO intake.  Intermittent cough, nonproductive.  No BRBPR nor melena.  No CP, dyspnea, abd pain, N/V.  Pt feels "tired" he states.  Says his "ankles hurt".   ED Course: 2g HGB drop in past 3 months to 8.6 today.  Hemoccult neg.  Does have dried blood in his oropharynx, family reports that he "sometimes bites his tongue".  CT head without acute changes.  EDP notes alternating tachy/brady on telemetry with HRs going as high as 140-150s then will dip into 40-50s range.  Cards recd admission and cards evaluation.   Review of Systems: As per HPI, otherwise all review of systems negative.  Past Medical History:  Diagnosis Date   Anemia    STARTED PILLS 1 MONTH AGO NEW DX   Aortic insufficiency    mild, moderate   Arthritis    FINGERS SWELL PT THINKS ARTHITIS, TOP OF WRIST ALSO SWELLS ONCE IN A WHILE   Bleeds easily (HCC)    BRUISE EASY ALSO   Chronic kidney disease    Cough    LAST 3 TO 4 WEEKS PHLEGM YELLOW INTERMITTENT COUGH NO FEVER   Dyslipidemia    H/O dizziness    OCC    H/O Doppler ultrasound 2010   H/O echocardiogram 2013   aortic valve disorder, EF =>55%   H/O echocardiogram 2012   AVD, EF =>55%   Heart murmur    Hemorrhagic stroke (HCC)    History of hiatal hernia    HTN (hypertension)    Inguinal hernia    Mitral regurgitation    mild   Pneumonia FEW YEARS AGO   X 1    Post-polio syndrome age 67   Seizures ([place])    last sz 2016 NO CAUSE FOUND, 3 seizures on 03/25/18    Swelling of both lower extremities    DOES NOT ALWAYS DO DOWN    Tricuspid regurgitation    mild    Past Surgical History:  Procedure Laterality Date   COLONSCOPY  5 YRS AGO   DENTAL SURGERY  3-4 WEEKS AGO   FEMORAL HERNIA REPAIR Right 08/17/2017   Procedure: HERNIA REPAIR FEMORAL LAPAROSCOPIC;  Surgeon: Michael Boston, MD;  [facility];  Service: General;  Laterality: Right;   INGUINAL HERNIA REPAIR Bilateral 08/17/2017   Procedure: LAPAROSCOPIC BILATERAL INGUINAL HERNIA REPAIR;  Surgeon: Michael Boston, MD;  [facility];  Service: General;  Laterality: Bilateral;   INSERTION OF MESH Bilateral 08/17/2017   Procedure: INSERTION OF MESH;  Surgeon: Michael Boston, MD;  [facility];  Service: General;  Laterality: Bilateral;   TOTAL HIP ARTHROPLASTY Left 02/13/2018   Procedure: HEMI  ARTHROPLASTY;  Surgeon: Paralee Cancel, MD;  Location: [facility];  Service: Orthopedics;  Laterality: Left;     reports that he quit smoking about 57 years ago. His smoking use included cigarettes. He has a 7.50 pack-year smoking history.  He has never used smokeless tobacco. He reports that he does not drink alcohol and does not use drugs.  No Known Allergies  Family History  Problem Relation Age of Onset   Cancer Mother    Heart disease Father    Cancer Maternal Grandmother    Cancer Maternal Grandfather    Kidney failure Sister      Prior to Admission medications   Medication Sig Start Date End Date Taking? Authorizing Provider  aspirin EC 81 MG tablet Take 1 tablet (81 mg total) by mouth every other day. 11/21/18  Yes Croitoru, Mihai, MD  carbamazepine (CARBATROL) 100 MG 12 hr capsule Take 1 capsule (100 mg total) by mouth 2 (two) times daily. 01/27/20  Yes Penumalli, Earlean Polka, MD  Cholecalciferol (VITAMIN D-3) 1000 UNITS CAPS Take 1,000 Units by mouth daily.    Yes [provider]   ezetimibe (ZETIA) 10 MG tablet TAKE 1 TABLET BY MOUTH ONCE DAILY. THIS IS TO REPLACE THE ATORVASTATIN Patient taking differently: Take 10 mg by mouth daily. 07/20/20  Yes Croitoru, Mihai, MD  levETIRAcetam (KEPPRA) 750 MG tablet Take 2 tablets (1,500 mg total) by mouth 2 (two) times daily. 01/27/20  Yes Penumalli, Earlean Polka, MD  magnesium oxide (MAG-OX) 400 MG tablet Take 400 mg by mouth daily.   Yes [provider]  NON FORMULARY Take 120 mLs by mouth 2 (two) times daily at 8am and 2pm. House supplement beverage drink   Yes [provider]  pantoprazole (PROTONIX) 40 MG tablet Take 1 tablet (40 mg total) by mouth daily. 08/26/20  Yes Kathie Dike, MD  triamcinolone (KENALOG) 0.147 MG/GM topical spray Apply 1 spray topically 2 (two) times daily as needed (rash).   Yes [provider]  vitamin B-12 (CYANOCOBALAMIN) 1000 MCG tablet Take 1,000 mcg by mouth daily.   Yes [provider]  vitamin C (ASCORBIC ACID) 500 MG tablet Take 1,000 mg by mouth daily.   Yes [provider]  vitamin E 400 UNIT capsule Take 400 Units by mouth daily.    Yes [provider]    Physical Exam: Vitals:   11/17/20 0100 11/17/20 0130 11/17/20 0230 11/17/20 0300  BP: 114/69 107/70 120/68 129/72  Pulse: 97 (!) 103 100 (!) 101  Resp: (!) 21 (!) 24 (!) 24 (!) 22  Temp:      TempSrc:      SpO2: 97% 96% 96% 97%    Constitutional: NAD, calm, comfortable Eyes: PERRL, lids and conjunctivae normal ENMT: dried blood in oropharynx Neck: normal, supple, no masses, no thyromegaly Respiratory: clear to auscultation bilaterally, no wheezing, no crackles. Normal respiratory effort. No accessory muscle use.  Cardiovascular: Regular rate and rhythm, no murmurs / rubs / gallops. No extremity edema. 2+ pedal pulses. No carotid bruits.  Abdomen: no tenderness, no masses palpated. No hepatosplenomegaly. Bowel sounds positive.  Musculoskeletal: no clubbing / cyanosis. No joint  deformity upper and lower extremities. Good ROM, no contractures. Normal muscle tone.  Skin: ? DTI to heel, dont really see an open wound though Neurologic: B foot drop present, L sided facial droop from prior stroke. Psychiatric: Oriented to self and location, not year.   Labs on Admission: I have personally reviewed following labs and imaging studies  CBC: Recent Labs  Lab 11/16/20 1621  WBC 6.6  NEUTROABS 4.6  HGB 8.6*  HCT 27.0*  MCV 98.9  PLT 371   Basic Metabolic Panel: Recent Labs  Lab 11/16/20 1621  NA 136  K  3.6  CL 100  CO2 25  GLUCOSE 133*  BUN 37*  CREATININE 1.26*  CALCIUM 10.1   GFR: CrCl cannot be calculated (Unknown ideal weight.). Liver Function Tests: Recent Labs  Lab 11/16/20 1621  AST 17  ALT 9  ALKPHOS 75  BILITOT 0.9  PROT 5.1*  ALBUMIN 2.6*   Recent Labs  Lab 11/16/20 1621  LIPASE 21   No results for input(s): AMMONIA in the last 168 hours. Coagulation Profile: No results for input(s): INR, PROTIME in the last 168 hours. Cardiac Enzymes: No results for input(s): CKTOTAL, CKMB, CKMBINDEX, TROPONINI in the last 168 hours. BNP (last 3 results) No results for input(s): PROBNP in the last 8760 hours. HbA1C: No results for input(s): HGBA1C in the last 72 hours. CBG: No results for input(s): GLUCAP in the last 168 hours. Lipid Profile: No results for input(s): CHOL, HDL, LDLCALC, TRIG, CHOLHDL, LDLDIRECT in the last 72 hours. Thyroid Function Tests: Recent Labs    11/16/20 2153  TSH 4.636*  FREET4 1.13*   Anemia Panel: No results for input(s): VITAMINB12, FOLATE, FERRITIN, TIBC, IRON, RETICCTPCT in the last 72 hours. Urine analysis:    Component Value Date/Time   COLORURINE YELLOW 11/16/2020 2153   APPEARANCEUR CLEAR 11/16/2020 2153   LABSPEC 1.025 11/16/2020 2153   PHURINE 5.5 11/16/2020 2153   GLUCOSEU NEGATIVE 11/16/2020 2153   HGBUR NEGATIVE 11/16/2020 2153   BILIRUBINUR NEGATIVE 11/16/2020 2153   KETONESUR NEGATIVE  11/16/2020 2153   PROTEINUR 30 (A) 11/16/2020 2153   UROBILINOGEN 0.2 10/13/2010 1103   NITRITE NEGATIVE 11/16/2020 2153   LEUKOCYTESUR NEGATIVE 11/16/2020 2153    Radiological Exams on Admission: DG Chest 2 View  Result Date: 11/16/2020 CLINICAL DATA:  87 year old male with history of weakness and altered mental status for 1 day. EXAM: CHEST - 2 VIEW COMPARISON:  Chest x-ray 08/21/2020. FINDINGS: Lung volumes are normal. No consolidative airspace disease. No pleural effusions. No pneumothorax. No pulmonary nodule or mass noted. Pulmonary vasculature and the cardiomediastinal silhouette are within normal limits. Atherosclerosis in the thoracic aorta. IMPRESSION: 1.  No radiographic evidence of acute cardiopulmonary disease. 2. Aortic atherosclerosis. Electronically Signed   By: Vinnie Langton M.D.   On: 11/16/2020 19:06   CT HEAD WO CONTRAST (5MM)  Result Date: 11/16/2020 CLINICAL DATA:  87 year old male with history of delirium. EXAM: CT HEAD WITHOUT CONTRAST TECHNIQUE: Contiguous axial images were obtained from the base of the skull through the vertex without intravenous contrast. COMPARISON:  Head CT 10/29/2020. FINDINGS: Brain: Moderate cerebral atrophy. Patchy and confluent areas of decreased attenuation are noted throughout the deep and periventricular white matter of the cerebral hemispheres bilaterally, compatible with chronic microvascular ischemic disease. More well-defined low-attenuation in the right basal ganglia and left thalamus, compatible with old lacunar infarcts. No evidence of acute infarction, hemorrhage, hydrocephalus, extra-axial collection or mass lesion/mass effect. Vascular: No hyperdense vessel or unexpected calcification. Skull: Normal. Negative for fracture or focal lesion. Sinuses/Orbits: Extensive mucoperiosteal thickening noted in the left sphenoid sinus where there is also some air-fluid levels. Paranasal sinuses are otherwise well pneumatized. Bilateral mastoids are  well pneumatized. Other: None. IMPRESSION: 1. No acute intracranial abnormalities. 2. Moderate cerebral atrophy with extensive chronic microvascular ischemic changes and multiple old lacunar infarcts in the basal ganglia bilaterally. 3. Sinusitis in the left sphenoid sinus with both chronic and potentially acute features. Clinical correlation for signs and symptoms of acute sinusitis is recommended. Electronically Signed   By: Vinnie Langton M.D.   On: 11/16/2020 19:42  EKG: Independently reviewed.  Assessment/Plan Principal Problem:   Acute encephalopathy Active Problems:   Benign essential hypertension   Seizure disorder (HCC)   CKD (chronic kidney disease), stage III (HCC)   Hemorrhagic stroke (HCC)   Seizures (HCC)   Tachy-brady syndrome (HCC)   Kidney lesion, native, left    Acute encephalopathy - Work up thus far is significant for EKG findings suspicious for tachy-brady syndrome. Work up also raises suspicion that he may be having breakthrough seizures given seizure history and blood in oropharynx today "bites his tongue". ? Tachy-brady syndrome - Tele monitor Not on any nodal blocking agents Cards called by EDP, recd admission and will evaluate Seizure disorder and ? Breakthrough seizures - Cont current seizure meds for the moment Seizure precautions Neurology will see patient in consult Check magnesium CKD 3a -  Chronic, stable, baseline L renal lesion - Seen on CT in May, not followed up. Will get MRI of renal lesion since neurology ordering MRI brain today anyhow. H/o stroke with hemorrhagic conversion in June - Cont ASA Not on Statin HTN - Not on chronic BP meds at this point Anemia - HGB 8.6 today down from 10 x3 months ago Hemoccult neg Repeat CBC in AM Not sure this is the cause of todays presentation though, especially with findings suspicious for tachy-brady syndrome and possible breakthrough seizures already.  DVT prophylaxis: SCDs - h/o hemorrhagic  stroke recently Code Status: DNR Family Communication: No family in room Disposition Plan: SNF after work ups for ? Tachy-brady syndrome by cards and ? Breakthrough seizures by neurology Consults called: EDP spoke with cardiology, I spoke with Dr. Curly [place] (neuro) Admission status: Place in obs    Charles Randall, [facility] [facility]  How to contact the [facility] Attending or Consulting provider [place] or covering provider during after hours [place], for this patient?  Check the care team in [facility] and look for a) attending/consulting [facility] provider listed and b) the [facility] team listed Log into [email or web address]  Amion Physician Scheduling and messaging for groups and whole hospitals  On call and physician scheduling software for group practices, residents, hospitalists and other medical providers for call, clinic, rotation and shift schedules. OnCall [employer] is a hospital-wide system for scheduling doctors and paging doctors on call. EasyPlot is for scientific plotting and data analysis.  [email or web address]  and use [place]'s universal password to access. If you do not have the password, please contact the hospital operator.  Locate the [facility] provider you are looking for under [facility] and page to a number that you can be directly reached. If you still have difficulty reaching the provider, please page the [facility] (Director on Call) for the Hospitalists listed on amion for assistance.  11/17/2020, 3:25 AM

## 2020-11-17 NOTE — Progress Notes (Signed)
CSW received a call from [facility] with [facility] who is willing to assist with patients discharge planning and provide home health services. Patient was receiving PT, OT,and ST. Amy can be reached at [phone]

## 2020-11-17 NOTE — Consult Note (Signed)
Neurology Consultation Reason for Consult: Concern for breakthrough seizure Requesting Physician: Jennette Kettle  CC: Generalized weakness  History is obtained from: Patient and chart review   HPI: Charles Randall. is a 87 y.o. 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  He presents with 1 week of progressive weakness, poor oral intake, intermittent cough and fatigue.  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  Additionally he failed swallow evaluation, and therefore could not get his carbamazepine. Primary team on evaluation is additionally concern for blood in oropharynx and possibility that this represents intermittent breakthrough seizures leading to his falls, noting that family reported to them he frequently bites his tongue  Neurology was last consulted 08/22/2020 for subacute stroke  on MRI which demonstrated a right basal ganglia subacute hemorrhagic infarct adjacent to the ventricle.  Per notes he has not had a seizure since 2020 with stable AED dosing since then.  At that time he was noted to have been having more frequent falls and general decline at home for at least 1 month.  He was subsequently seen by Dr. Pneumology his outpatient neurologist on 10/13/2020.  At that time examination was notable for left pupil 5 mm and unreactive with the eye deviated laterally and slightly inferiorly as well as the left ptosis, with decreased left nasolabial fold, bilateral hip drop, left hip flexor weakness 2-3/5, slowed left upper extremity compared to the right and diminished reflexes in the bilateral  upper extremities, absent in the lower extremities, wheelchair-bound  Per prior notes, his seizures are described as follows: "He is accompanied today by his long-term girlfriend who helps provide details regarding these episodes. Starting about 2-3 years ago and occurring about every 6 months, the patient has had spells where he will "blank out" for about 20-30 minutes. He reports complete amnesia during the spells, but denies any other symptoms occurring during or after the episodes. Other than a possible "warning surge" in his head immediately prior to the onset of the episodes, he denies any symptoms preceding the episodes. He has a hard time describing what the "warning surge" is. His girlfriend has noticed that during the episodes he has a "blank stare," "his face gets really white," and he will ask many questions over and over such as: "Where are we?" and "What are we doing here?" She denies any other signs, including loss of postural control, loss of bowel or bladder continence, orofacial twitching, tongue biting, etc. Immediately following the episodes, the patient is back to his normal self. 08/2008 to 09/2008: MRI brain w/ and w/out, MRA w/out, carotid dopplers, EEG, and neurocognitive battery all unrevealing. 10-hour ambulatory EEG on 12/2009 which was negative. Most recent episode: March 15, 2010 around 12:30 PM, and was different from previous four episodes. The girlfriend found him "slumped down" in a chair with his legs and arms "as stiff as a board" (arms flexed at elbow, legs flexed at knees, eyes closed). She was able to rouse him after about 3-4 minutes, and he reports being "as weak as water" afterward, as well as very tired, which led him to sleep for the rest of the day"  Antiseizure medications: Carbamazepine 100  mg twice daily Keppra 1500 mg twice daily  ROS: Unable to obtain due to altered mental status.   Past Medical History:  Diagnosis Date   Anemia    STARTED PILLS 1  MONTH AGO NEW DX   Aortic insufficiency    mild, moderate   Arthritis    FINGERS SWELL PT THINKS ARTHITIS, TOP OF WRIST ALSO SWELLS ONCE IN A WHILE   Bleeds easily (HCC)    BRUISE EASY ALSO   Chronic kidney disease    Cough    LAST 3 TO 4 WEEKS PHLEGM YELLOW INTERMITTENT COUGH NO FEVER   Dyslipidemia    H/O dizziness    OCC    H/O Doppler ultrasound 2010   H/O echocardiogram 2013   aortic valve disorder, EF =>55%   H/O echocardiogram 2012   AVD, EF =>55%   Heart murmur    Hemorrhagic stroke (HCC)    History of hiatal hernia    HTN (hypertension)    Inguinal hernia    Mitral regurgitation    mild   Pneumonia FEW YEARS AGO   X 1   Post-polio syndrome age 75   Seizures ([place])    last sz 2016 NO CAUSE FOUND, 3 seizures on 03/25/18    Swelling of both lower extremities    DOES NOT ALWAYS DO DOWN    Tricuspid regurgitation    mild    Family History  Problem Relation Age of Onset   Cancer Mother    Heart disease Father    Cancer Maternal Grandmother    Cancer Maternal Grandfather    Kidney failure Sister     Social History:  reports that he quit smoking about 57 years ago. His smoking use included cigarettes. He has a 7.50 pack-year smoking history. He has never used smokeless tobacco. He reports that he does not drink alcohol and does not use drugs.  Exam: Current vital signs: BP 115/62   Pulse (!) 102   Temp 97.8 F (36.6 C) (Oral)   Resp (!) 23   SpO2 97%  Vital signs in last 24 hours: Temp:  [97.8 F (36.6 C)] 97.8 F (36.6 C) (09/19 1556) Pulse Rate:  [94-103] 102 (09/20 0400) Resp:  [16-24] 23 (09/20 0400) BP: (94-129)/(62-82) 115/62 (09/20 0400) SpO2:  [94 %-97 %] 97 % (09/20 0400)   Physical Exam  Constitutional: Appears well-developed and well-nourished.  Psych: Flat affect, abulic but cooperative Eyes: No scleral injection HENT: No oropharyngeal obstruction.  MSK: no joint deformities.  Cardiovascular: Tachycardic at the time of my evaluation,  noted to be tachy/brady previously Respiratory: Effort normal, non-labored breathing GI: Soft.  No distension. There is no tenderness.  Skin: Warm dry and intact visible skin  Neuro: Mental Status: Patient is awake, alert, oriented to person, year but not month or date and able to give very limited history about the events leading to his hospitalization Able to name and repeat with significant delays, very abulic Cranial Nerves: II: Visual Fields are full.  Right pupil is more briskly reactive than the left, but both do constrict light III,IV, VI: Right eye does not cross medially very well.  Partially recovered left eye oculomotor nerve palsy V: Facial sensation is symmetric to light touch VII: Facial movement is notable for subtle left facial droop.  VIII: hearing is intact to voice X: Uvula elevates symmetrically XI: Shoulder shrug is symmetric. XII: tongue is midline without atrophy or fasciculations.  Motor: Tone is normal. Bulk is normal.  He is  weakly antigravity with bilateral upper extremities (3/5), 2/5 in the lowers Sensory: Sensation is symmetric to light touch and temperature in the arms and legs. Cerebellar: Finger-to-nose intact bilaterally   I have reviewed labs in epic and the results pertinent to this consultation are:  Basic Metabolic Panel: Recent Labs  Lab 11/16/20 1621 11/17/20 0554  NA 136 136  K 3.6 3.6  CL 100 99  CO2 25 25  GLUCOSE 133* 116*  BUN 37* 36*  CREATININE 1.26* 1.09  CALCIUM 10.1 10.4*  MG  --  1.8    CBC: Recent Labs  Lab 11/16/20 1621 11/17/20 0554  WBC 6.6 6.5  NEUTROABS 4.6  --   HGB 8.6* 8.3*  HCT 27.0* 25.8*  MCV 98.9 98.9  PLT 168 159   Hemoglobin drop from 10s in the last admission  I have reviewed the images obtained:  MRI brain 08/21/2020 personally reviewed, agree with radiology: 1. Findings most consistent with an evolving subacute hemorrhagic right basal ganglia infarct as above. 9 mm soft tissue density  along the lateral wall of the right lateral ventricle consistent with associated hemorrhage, corresponding with abnormality on prior CT. While these findings are favored to be vascular in ischemic in nature, a short interval follow-up MRI to ensure these changes resolve is recommended. 2. Additional subtle hazy diffusion abnormality involving the periventricular white matter adjacent to the frontal horn of the left lateral ventricle, also suspicious for evolving subacute small vessel ischemia. 3. Underlying age-related cerebral atrophy with chronic microvascular ischemic disease.  Head CT 10/29/2020 personally reviewed, per radiology read: "Abnormal CT scan of the head without contrast showing changes of subacute resolving hemorrhagic infarct in the right basal ganglia.  There are age-appropriate changes of chronic small vessel disease and generalized cerebral atrophy.  Compared with MRI scan dated 08/21/2020 only expected evolutionary changes are noted."  CT angio chest/abd/pelvis 07/19/2020 No evidence of thoracic or abdominal aortic dissection. 4.7 cm ascending thoracic aortic aneurysm. Ascending thoracic aortic aneurysm. Recommend semi-annual imaging followup by CTA or MRA and referral to cardiothoracic surgery if not already obtained. This recommendation follows 2010 ACCF/AHA/AATS/ACR/ASA/SCA/SCAI/SIR/STS/SVM Guidelines for the Diagnosis and Management of Patients With Thoracic Aortic Disease. Circulation. 2010; 121: T465-K812. Aortic aneurysm NOS (ICD10-I71.9).   Moderate right pleural effusion is noted with adjacent atelectasis of the right lower lobe.   Moderate to severe stenoses are seen involving the proximal portions of the celiac and superior mesenteric arteries.   2.0 x 1.8 cm exophytic soft tissue abnormality is seen arising posteriorly from upper pole of left kidney concerning for possible neoplasm. Further evaluation with MRI with and without gadolinium administration is  recommended.  Impression: Overall this patient's clinical history is concerning for progressive subacute decline over the last few months.  He appears quite cachectic on examination, and though his weight has not yet been charted on this admission, I am concerned that he has lost a significant amount of weight given his described reduced appetite and dysphagia.  Additionally the combination of his prior CT chest abdomen pelvis findings with his prior hemorrhagic brain lesion findings is concerning for potential metastatic disease to the brain.    Regarding the possibility of breakthrough seizure activity, he has been stable for many years on his current antiseizure medications and there is no clear description of seizure activity, but given his altered mental status will evaluate for nonconvulsive status epilepticus routine EEG.  Unfortunately he is not have oral access and feeding tube is not within his goals of  care, so we are unable to continue carbamazepine at this time, which has no IV substitute  Recommendations: -MRI brain with and without contrast to follow-up 08/21/2020 hemorrhage and more sensitively exclude underlying mass lesion, as well as evaluate for central causes of dysphagia -Consider further imaging of the exophytic kidney lesion seen previously, given high potential for hemorrhagic metastasis from renal cell carcinoma, findings of worsening likely malignancy could inform goals of care further -Routine EEG -SLP evaluation, reorder carbamazapine if able based on their medication recs ASAP -Neurology will continue to follow  [place] [phone] Available 7 PM to 7 AM, outside of these hours please call Neurologist on call as listed on Amion.

## 2020-11-17 NOTE — ED Notes (Signed)
MD at bedside. 

## 2020-11-17 NOTE — Consult Note (Signed)
WOC Nurse Consult Note: Patient receiving care in [facility] ED RESUS Reason for Consult: DTI heels Wound type: Bilateral heels are pink but blanchable. Pressure Injury POA: NA Drainage (amount, consistency, odor) None Periwound: Dressing procedure/placement/frequency: Place a heel foam dressings to both heels along with bilateral Prevalon heel lift boots.  Pressure Injury Prevention Bundle May use any that apply to this patient. Support surfaces (air mattress) chair cushion Kellie Simmering # [phone]) Heel offloading boots Kellie Simmering # [phone]) Turning and Positioning  Measures to reduce shear (draw sheet, knees up) Skin protection Products (Foam dressing) Moisture management products (Critic-Aid Barrier Cream (Purple top) Sween moisturizing lotion (Pink top in clean supply) Nutrition Management Protection for Medical Devices Routine Skin Assessment   Monitor the wound area(s) for worsening of condition such as: Signs/symptoms of infection, increase in size, development of or worsening of odor, development of pain, or increased pain at the affected locations.   Notify the medical team if any of these develop.  Thank you for the consult. [place] nurse will not follow at this time.   Please re-consult the [place] team if needed.  Cathlean Marseilles Tamala Julian, MSN, RN, [place], Lysle Pearl, [facility] Wound Treatment Associate Pager [phone]

## 2020-11-17 NOTE — Progress Notes (Signed)
NPO due to failing bedside swallow.  SLP eval  Held PO meds.  Converted Keppra to IV.  Spoke with neurology: not much we can do about the carbamazepine (doesn't have an IV option).

## 2020-11-18 ENCOUNTER — Inpatient Hospital Stay (HOSPITAL_COMMUNITY): Payer: Medicare Other

## 2020-11-18 ENCOUNTER — Other Ambulatory Visit: Payer: Self-pay

## 2020-11-18 DIAGNOSIS — E43 Unspecified severe protein-calorie malnutrition: Secondary | ICD-10-CM | POA: Insufficient documentation

## 2020-11-18 DIAGNOSIS — Z7189 Other specified counseling: Secondary | ICD-10-CM

## 2020-11-18 DIAGNOSIS — I619 Nontraumatic intracerebral hemorrhage, unspecified: Secondary | ICD-10-CM | POA: Diagnosis not present

## 2020-11-18 DIAGNOSIS — N289 Disorder of kidney and ureter, unspecified: Secondary | ICD-10-CM

## 2020-11-18 DIAGNOSIS — Z66 Do not resuscitate: Secondary | ICD-10-CM

## 2020-11-18 DIAGNOSIS — Z515 Encounter for palliative care: Secondary | ICD-10-CM

## 2020-11-18 DIAGNOSIS — N1831 Chronic kidney disease, stage 3a: Secondary | ICD-10-CM | POA: Diagnosis not present

## 2020-11-18 DIAGNOSIS — G9340 Encephalopathy, unspecified: Secondary | ICD-10-CM | POA: Diagnosis not present

## 2020-11-18 LAB — CBC
HCT: 27.6 % — ABNORMAL LOW (ref 39.0–52.0)
Hemoglobin: 8.8 g/dL — ABNORMAL LOW (ref 13.0–17.0)
MCH: 31.7 pg (ref 26.0–34.0)
MCHC: 31.9 g/dL (ref 30.0–36.0)
MCV: 99.3 fL (ref 80.0–100.0)
Platelets: 134 10*3/uL — ABNORMAL LOW (ref 150–400)
RBC: 2.78 MIL/uL — ABNORMAL LOW (ref 4.22–5.81)
RDW: 16.1 % — ABNORMAL HIGH (ref 11.5–15.5)
WBC: 4.8 10*3/uL (ref 4.0–10.5)
nRBC: 0 % (ref 0.0–0.2)

## 2020-11-18 LAB — BASIC METABOLIC PANEL
Anion gap: 10 (ref 5–15)
BUN: 37 mg/dL — ABNORMAL HIGH (ref 8–23)
CO2: 26 mmol/L (ref 22–32)
Calcium: 10.4 mg/dL — ABNORMAL HIGH (ref 8.9–10.3)
Chloride: 103 mmol/L (ref 98–111)
Creatinine, Ser: 1.19 mg/dL (ref 0.61–1.24)
GFR, Estimated: 58 mL/min — ABNORMAL LOW (ref >60)
Glucose, Bld: 110 mg/dL — ABNORMAL HIGH (ref 70–99)
Potassium: 3.6 mmol/L (ref 3.5–5.1)
Sodium: 139 mmol/L (ref 135–145)

## 2020-11-18 LAB — IRON AND TIBC
Iron: 50 ug/dL (ref 45–182)
Saturation Ratios: 29 % (ref 17.9–39.5)
TIBC: 175 ug/dL — ABNORMAL LOW (ref 250–450)
UIBC: 125 ug/dL

## 2020-11-18 LAB — RETICULOCYTES
Immature Retic Fract: 15.9 % (ref 2.3–15.9)
RBC.: 2.75 MIL/uL — ABNORMAL LOW (ref 4.22–5.81)
Retic Count, Absolute: 126.2 10*3/uL (ref 19.0–186.0)
Retic Ct Pct: 4.6 % — ABNORMAL HIGH (ref 0.4–3.1)

## 2020-11-18 LAB — FERRITIN: Ferritin: 1501 ng/mL — ABNORMAL HIGH (ref 24–336)

## 2020-11-18 LAB — MRSA NEXT GEN BY PCR, NASAL: MRSA by PCR Next Gen: NOT DETECTED

## 2020-11-18 LAB — FOLATE: Folate: 12.3 ng/mL (ref >5.9)

## 2020-11-18 LAB — VITAMIN B12: Vitamin B-12: 2184 pg/mL — ABNORMAL HIGH (ref 180–914)

## 2020-11-18 MED ORDER — GUAIFENESIN 100 MG/5ML PO SOLN
5.0000 mL | ORAL | Status: DC | PRN
  Administered 2020-11-18 – 2020-11-19 (×3): 100 mg via ORAL
  Filled 2020-11-18 (×3): qty 5

## 2020-11-18 MED ORDER — CARBAMAZEPINE 100 MG PO CHEW
100.0000 mg | CHEWABLE_TABLET | Freq: Two times a day (BID) | ORAL | Status: DC
  Filled 2020-11-18: qty 1

## 2020-11-18 MED ORDER — LEVETIRACETAM 500 MG PO TABS
1500.0000 mg | ORAL_TABLET | Freq: Two times a day (BID) | ORAL | Status: DC
  Administered 2020-11-18 – 2020-11-19 (×2): 1500 mg via ORAL
  Filled 2020-11-18 (×2): qty 3

## 2020-11-18 MED ORDER — GADOBUTROL 1 MMOL/ML IV SOLN
6.0000 mL | Freq: Once | INTRAVENOUS | Status: AC | PRN
  Administered 2020-11-18: 6 mL via INTRAVENOUS

## 2020-11-18 MED ORDER — CARBAMAZEPINE ER 100 MG PO TB12
100.0000 mg | ORAL_TABLET | Freq: Two times a day (BID) | ORAL | Status: DC
  Administered 2020-11-18 – 2020-11-19 (×3): 100 mg via ORAL
  Filled 2020-11-18 (×4): qty 1

## 2020-11-18 NOTE — Plan of Care (Signed)
  Problem: Coping: Goal: Level of anxiety will decrease Outcome: Progressing   Problem: Pain Managment: Goal: General experience of comfort will improve Outcome: Progressing   Problem: Safety: Goal: Ability to remain free from injury will improve Outcome: Progressing   

## 2020-11-18 NOTE — Progress Notes (Signed)
PROGRESS NOTE    [name]  PJK:[id number] DOB: 1927/11/30 DOA: 11/16/2020 PCP: Nickola Major, MD  Brief Narrative: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 -History of frequent falls in May, subsequently diagnosed with CVA in May/June 22, had a right basal ganglia subacute hemorrhagic infarct then -Was brought to the ED by family yesterday evening on account of progressive weakness, decreased oral oral intake, sleeping most of the time, less interactive, overall failure to thrive they noticed over the last 1 to 2 weeks.  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 Large progressive retroperitoneal mass, likely lymphoma  Failure to thrive -Admitted with noted to have a functional decline over the last couple of months -EEG noted nonspecific encephalopathy -CT head noted moderate atrophy -MRI brain noted multifocal enhancement concerning for primary CNS lymphoma and abdomen with large progressive retroperitoneal mass -Discussed above findings with patient's daughter in room and on the telephone, given advanced age, frailty and very poor functional status they would not want to pursue further work-up, treatment -Palliative consult requested -Appreciate neuro input, Dr. Rory Percy will discuss with neurooncology for prognostication   ?   Tachybradycardia -No further episodes of bradycardia, heart rate in the 90-100 range now    History of seizure disorder -Per daughter this was remote, continue Keppra,, restart tegretol cleared by SLP    History of CVA with hemorrhagic conversion in June -On baby aspirin at baseline, continued   Normocytic anemia -Hemoglobin lower by 2 g from 3 months ago, Hemoccult negative in the ED -Anemia panel consistent with chronic disease, likely secondary to above lymphoma   DVT prophylaxis: SCDs CODE STATUS: DNR   Family Communication: Daughter at bedside Disposition Plan:  Status is: Inpatient  Remains inpatient appropriate because:Inpatient level of care appropriate due to severity of illness  Dispo: The patient is from: ALF              Anticipated d/c is to: SNF              Patient currently is not medically stable to d/c.   Difficult to place patient No        Consultants:  Neurology, palliative medicine  Procedures:   Antimicrobials:    Subjective: -Laying in bed, limited interaction, oral intake is poor  Objective: Vitals:   11/17/20 1830 11/17/20 2230 11/18/20 0347 11/18/20 0636  BP: (!) 103/59 (!) 106/55  140/60  Pulse: 94 91  90  Resp: (!) 28 20    Temp:  98.9 F (37.2 C)  99.7 F (37.6 C)  TempSrc:  Oral  Oral  SpO2: 97% 94%  93%  Height:   5\' 6"  (1.676 m)     Intake/Output Summary (Last 24 hours) at 11/18/2020 1018 Last data filed at 11/17/2020 2043 Gross per 24 hour  Intake 100 ml  Output --  Net  100 ml   There were no vitals filed for this visit.  Examination:  General exam: Chronically ill frail cachectic male laying in bed, somnolent but easily arousable, oriented to self, partly to place, positive dysarthria  CVS: S1-S2, regular rate rhythm Lungs: Decreased breath sounds to bases otherwise clear Abdomen: Soft, nontender, bowel sounds present Extremities: No edema Neuro: Somnolent, but arousable, oriented to self and partly to place  only,, mild left facial droop, significantly decreased strength in both lower extremities Psychiatry: Poor insight and judgment    Data Reviewed:   CBC: Recent Labs  Lab 11/16/20 1621 11/17/20 0554 11/18/20 0423  WBC 6.6 6.5 4.8  NEUTROABS 4.6  --   --   HGB 8.6* 8.3* 8.8*  HCT 27.0* 25.8* 27.6*  MCV 98.9 98.9 99.3  PLT 168 159 440*   Basic Metabolic Panel: Recent Labs  Lab 11/16/20 1621 11/17/20 0554 11/18/20 0423  NA 136 136 139  K 3.6 3.6 3.6  CL 100 99 103  CO2 25 25 26   GLUCOSE 133* 116* 110*  BUN 37* 36* 37*  CREATININE 1.26* 1.09 1.19  CALCIUM 10.1 10.4* 10.4*  MG  --  1.8  --    GFR: CrCl cannot be calculated (Unknown ideal weight.). Liver Function Tests: Recent Labs  Lab 11/16/20 1621  AST 17  ALT 9  ALKPHOS 75  BILITOT 0.9  PROT 5.1*  ALBUMIN 2.6*   Recent Labs  Lab 11/16/20 1621  LIPASE 21   No results for input(s): AMMONIA in the last 168 hours. Coagulation Profile: No results for input(s): INR, PROTIME in the last 168 hours. Cardiac Enzymes: No results for input(s): CKTOTAL, CKMB, CKMBINDEX, TROPONINI in the last 168 hours. BNP (last 3 results) No results for input(s): PROBNP in the last 8760 hours. HbA1C: No results for input(s): HGBA1C in the last 72 hours. CBG: No results for input(s): GLUCAP in the last 168 hours. Lipid Profile: No results for input(s): CHOL, HDL, LDLCALC, TRIG, CHOLHDL, LDLDIRECT in the last 72 hours. Thyroid Function Tests: Recent Labs    11/16/20 2153  TSH 4.636*  FREET4 1.13*   Anemia Panel: Recent Labs    11/18/20 0423  VITAMINB12 2,184*  FOLATE 12.3  FERRITIN 1,501*  TIBC 175*  IRON 50  RETICCTPCT 4.6*   Urine analysis:    Component Value Date/Time   COLORURINE YELLOW 11/16/2020 2153   APPEARANCEUR CLEAR 11/16/2020 2153   LABSPEC 1.025 11/16/2020 2153   PHURINE 5.5 11/16/2020 2153   GLUCOSEU NEGATIVE 11/16/2020 2153   HGBUR NEGATIVE 11/16/2020 2153   [place] NEGATIVE 11/16/2020  2153   KETONESUR NEGATIVE 11/16/2020 2153   PROTEINUR 30 (A) 11/16/2020 2153   UROBILINOGEN 0.2 10/13/2010 1103   NITRITE NEGATIVE 11/16/2020 2153   LEUKOCYTESUR NEGATIVE 11/16/2020 2153   Sepsis Labs: @LABRCNTIP (procalcitonin:4,lacticidven:4)  ) Recent Results (from the past 240 hour(s))  Resp Panel by RT-PCR (Flu A&B, Covid) Nasopharyngeal Swab     Status: None   Collection Time: 11/16/20  6:26 PM   Specimen: Nasopharyngeal Swab; Nasopharyngeal(NP) swabs in vial transport medium  Result Value Ref Range Status   SARS Coronavirus 2 by RT PCR NEGATIVE NEGATIVE Final    Comment: (NOTE) SARS-CoV-2 target nucleic acids are NOT DETECTED.  The SARS-CoV-2 RNA is generally detectable in upper respiratory specimens during the acute phase of infection. The lowest concentration of SARS-CoV-2 viral copies this assay can detect is 138 copies/mL. A negative result does not preclude SARS-Cov-2 infection and should not be used as the sole  basis for treatment or other patient management decisions. A negative result may occur with  improper specimen collection/handling, submission of specimen other than nasopharyngeal swab, presence of viral mutation(s) within the areas targeted by this assay, and inadequate number of viral copies(<138 copies/mL). A negative result must be combined with clinical observations, patient history, and epidemiological information. The expected result is Negative.  Fact Sheet for Patients:  EntrepreneurPulse.com.au  Fact Sheet for Healthcare Providers:  IncredibleEmployment.be  This test is no t yet approved or cleared by the [place] FDA and  has been authorized for detection and/or diagnosis of SARS-CoV-2 by FDA under an Emergency Use Authorization (EUA). This EUA will remain  in effect (meaning this test can be used) for the duration of the COVID-19 declaration under Section 564(b)(1) of the Act, 21 U.S.C.section  360bbb-3(b)(1), unless the authorization is terminated  or revoked sooner.       Influenza A by PCR NEGATIVE NEGATIVE Final   Influenza B by PCR NEGATIVE NEGATIVE Final    Comment: (NOTE) The Xpert Xpress SARS-CoV-2/FLU/RSV plus assay is intended as an aid in the diagnosis of influenza from Nasopharyngeal swab specimens and should not be used as a sole basis for treatment. Nasal washings and aspirates are unacceptable for Xpert Xpress SARS-CoV-2/FLU/RSV testing.  Fact Sheet for Patients: EntrepreneurPulse.com.au  Fact Sheet for Healthcare Providers: IncredibleEmployment.be  This test is not yet approved or cleared by the [place] FDA and has been authorized for detection and/or diagnosis of SARS-CoV-2 by FDA under an Emergency Use Authorization (EUA). This EUA will remain in effect (meaning this test can be used) for the duration of the COVID-19 declaration under Section 564(b)(1) of the Act, 21 U.S.C. section 360bbb-3(b)(1), unless the authorization is terminated or revoked.  Performed at [facility], [place] [address] [place], [place] [address]   MRSA Next Gen by PCR, Nasal     Status: None   Collection Time: 11/17/20 10:21 PM   Specimen: Nasal Mucosa; Nasal Swab  Result Value Ref Range Status   MRSA by PCR Next Gen NOT DETECTED NOT DETECTED Final    Comment: (NOTE) The GeneXpert MRSA Assay (FDA approved for NASAL specimens only), is one component of a comprehensive MRSA colonization surveillance program. It is not intended to diagnose MRSA infection nor to guide or monitor treatment for MRSA infections. Test performance is not FDA approved in patients less than 18 years old. Performed at [facility], [place] [address], [place], [place] [address]          Radiology Studies: DG Chest 2 View  Result Date: 11/16/2020 CLINICAL DATA:  87 year old male with history of weakness and altered mental status for 1 day. EXAM:  CHEST - 2 VIEW COMPARISON:  Chest x-ray 08/21/2020. FINDINGS: Lung volumes are normal. No consolidative airspace disease. No pleural effusions. No pneumothorax. No pulmonary nodule or mass noted. Pulmonary vasculature and the cardiomediastinal silhouette are within normal limits. Atherosclerosis in the thoracic aorta. IMPRESSION: 1.  No radiographic evidence of acute cardiopulmonary disease. 2. Aortic atherosclerosis. Electronically Signed   By: Vinnie Langton M.D.   On: 11/16/2020 19:06   CT HEAD WO CONTRAST (5MM)  Result Date: 11/16/2020 CLINICAL DATA:  87 year old male with history of delirium. EXAM: CT HEAD WITHOUT CONTRAST TECHNIQUE: Contiguous axial images were obtained from the base of the skull through the vertex without intravenous contrast. COMPARISON:  Head CT 10/29/2020. FINDINGS: Brain: Moderate cerebral atrophy. Patchy and confluent areas of decreased attenuation are noted throughout the  deep and periventricular white matter of the cerebral hemispheres bilaterally, compatible with chronic microvascular ischemic disease. More well-defined low-attenuation in the right basal ganglia and left thalamus, compatible with old lacunar infarcts. No evidence of acute infarction, hemorrhage, hydrocephalus, extra-axial collection or mass lesion/mass effect. Vascular: No hyperdense vessel or unexpected calcification. Skull: Normal. Negative for fracture or focal lesion. Sinuses/Orbits: Extensive mucoperiosteal thickening noted in the left sphenoid sinus where there is also some air-fluid levels. Paranasal sinuses are otherwise well pneumatized. Bilateral mastoids are well pneumatized. Other: None. IMPRESSION: 1. No acute intracranial abnormalities. 2. Moderate cerebral atrophy with extensive chronic microvascular ischemic changes and multiple old lacunar infarcts in the basal ganglia bilaterally. 3. Sinusitis in the left sphenoid sinus with both chronic and potentially acute features. Clinical correlation for  signs and symptoms of acute sinusitis is recommended. Electronically Signed   By: Vinnie Langton M.D.   On: 11/16/2020 19:42   MR BRAIN W WO CONTRAST  Result Date: 11/18/2020 CLINICAL DATA:  87 year old male with delirium.  Weakness. EXAM: MRI HEAD WITHOUT AND WITH CONTRAST TECHNIQUE: Multiplanar, multiecho pulse sequences of the brain and surrounding structures were obtained without and with intravenous contrast. CONTRAST:  38mL GADAVIST GADOBUTROL 1 MMOL/ML IV SOLN COMPARISON:  Head CT 11/16/2020.  Brain MRI 08/21/2020. FINDINGS: Brain: Unresolved, and progressive signal abnormality in the bilateral basal ganglia and periventricular white matter since July including heterogeneous diffusion restriction, T2/FLAIR hyperintensity, and patchy postcontrast enhancement. Progression at all sites involvement seen in June, although still relatively mild regional mass effect. New central corpus callosum involvement. And new anterior left inferior frontal gyrus subcortical involvement. There is some associated hemosiderin or mineralization at each site except perhaps the inferior frontal gyrus. And some of the right basal ganglia T1 signal is intrinsic. No thalamic, brainstem, or cerebellar involvement. No midline shift, ventriculomegaly, extra-axial collection or acute intracranial hemorrhage. Cervicomedullary junction and pituitary are within normal limits. Underlying chronic periventricular and scattered white matter T2 and FLAIR hyperintensity more typical of chronic small vessel disease. No cortical encephalomalacia. But there are small chronic infarcts in the bilateral cerebellum. No dural thickening. No other abnormal intracranial enhancement identified. Vascular: Major intracranial vascular flow voids are stable. The major dural venous sinuses are enhancing and appear to be patent. Skull and upper cervical spine: Visible cervical spine degeneration is stable. Background bone marrow signal remains normal. Negative  visible spinal cord. Sinuses/Orbits: Stable orbits.  Chronic left sphenoid sinus disease. Other: Mastoids remain well aerated. Visible internal auditory structures appear normal. Negative visible scalp and face. IMPRESSION: 1. Unresolved since June, progressive, and now multifocal abnormal diffusion and enhancement in the bilateral basal ganglia, regional periventricular white matter, and also white matter of the left inferior frontal gyrus. This constellation is highly suspicious for Primary CNS Lymphoma, particularly large B-cell lymphoma which frequently can mimic ischemia. Recommend Neuro-Oncology consultation. 2. No significant intracranial mass effect. No other acute intracranial abnormality. Electronically Signed   By: Genevie Ann M.D.   On: 11/18/2020 06:57   MR ABDOMEN W WO CONTRAST  Result Date: 11/17/2020 CLINICAL DATA:  87 year old male with suspected renal mass. EXAM: MRI ABDOMEN WITHOUT AND WITH CONTRAST TECHNIQUE: Multiplanar multisequence MR imaging of the abdomen was performed both before and after the administration of intravenous contrast. CONTRAST:  4.4mL GADAVIST GADOBUTROL 1 MMOL/ML IV SOLN COMPARISON:  Comparison made with CT angiography of the chest, abdomen and pelvis from Jul 19, 2020. FINDINGS: Lower chest: Small effusions in the lung bases. No dense consolidative process. Limited assessment on  MRI of this area. Hepatobiliary: No focal, suspicious hepatic lesion. No pericholecystic stranding. Cholelithiasis. No biliary duct dilation. Pancreas: Signs of pancreatic atrophy, no gross ductal dilation or inflammation. Mildly limited assessment due to respiratory motion. Spleen:  Normal size and contour without focal lesion. Adrenals/Urinary Tract:  Adrenal glands are normal. Symmetric renal enhancement. Perirenal and retroperitoneal nodules in the LEFT retroperitoneum largest 2.0 x 1.3 cm (image 35/19) RIGHT retroperitoneal nodules and masses inseparable from the RIGHT psoas (image 60/19)  weakly enhancing and measuring 6.1 x 3.7 cm in this area. Larger RIGHT retroperitoneal mass displaces the colon measuring 7.8 x 4.8 cm and is incompletely imaged on the current study except in the coronal plane where it tracks along the iliopsoas musculature encompassing approximally 14 cm of the retroperitoneum in craniocaudal extent. No hydronephrosis despite these findings.  Renal vein is patent. Stomach/Bowel: Limited assessment of gastrointestinal structures without acute process. Displacement of the colon in the RIGHT abdomen due to retroperitoneal masses. Vascular/Lymphatic: No pathologically enlarged lymph nodes identified. No abdominal aortic aneurysm demonstrated. Aortic atherosclerosis. Other:  No ascites. Musculoskeletal: No suspicious bone lesions identified. IMPRESSION: Retroperitoneal masses and nodules greatest on the RIGHT, findings are suspicious for lymphoproliferative disorder primarily though are of uncertain significance and are hypovascular based on current MR assessment. Metastatic process is also considered. No solid organ involvement, retroperitoneal adenopathy, discrete lymph nodes in the retroperitoneum or signs of splenic enlargement. Constellation of findings potentially confounding when considering the differential. Ultimately biopsy may be necessary for further evaluation as warranted. PET could also be considered on follow-up to assess for additional sites of disease or potential site of primary neoplasm. Cholelithiasis. Small effusions in the lung bases. Electronically Signed   By: Zetta Bills M.D.   On: 11/17/2020 13:16   EEG adult  Result Date: 11/17/2020 Lora Havens, MD     11/17/2020 12:15 PM Patient Name: Whitten Andreoni. MRN: [id number] Epilepsy Attending: Lora Havens Referring Physician/Provider: Dr Lesleigh Noe Date: 11/17/2020 Duration: 26.13 mins Patient history: 87yo m with h/o seizure now with worsening mental status and possible tongue bite. EEG to  evaluate for seizure Level of alertness: Awake, asleep AEDs during EEG study: CBZ, LEV Technical aspects: This EEG study was done with scalp electrodes positioned according to the 10-20 International system of electrode placement. Electrical activity was acquired at a sampling rate of 500Hz  and reviewed with a high frequency filter of 70Hz  and a low frequency filter of 1Hz . EEG data were recorded continuously and digitally stored. Description: The posterior dominant rhythm consists of 8-9Hz  activity of moderate voltage (25-35 uV) seen predominantly in posterior head regions, symmetric and reactive to eye opening and eye closing. Sleep was characterized by vertex waves, sleep spindles (12 to 14 Hz), maximal frontocentral region.  EEG showed intermittent generalized 3 to 6 Hz theta-delta slowing. Hyperventilation and photic stimulation were not performed.   ABNORMALITY - Intermittent slow, generalized IMPRESSION: This study suggestive of mild diffuse encephalopathy, nonspecific etiology. No seizures or epileptiform discharges were seen throughout the recording. Priyanka Barbra Sarks        Scheduled Meds:  phenytoin (DILANTIN) IV  50 mg Intravenous Q8H   Continuous Infusions:  sodium chloride 75 mL/hr at 11/17/20 1628   levETIRAcetam 1,500 mg (11/18/20 0634)     LOS: 1 day    Time spent: 36min    Domenic Polite, MD [facility]   11/18/2020, 10:18 AM

## 2020-11-18 NOTE — Plan of Care (Signed)
Communicated with neuro-oncology. They will discuss this case Monday in the tumor board. Ideally should get histological diagnosis but in the absence of that, can try some treatment modalities-pending neuro-oncology tumor board discussion. Discussed the plan with the family. They are unclear as to where the patient goes from here-he is not going to be able to go back to the facility where he was that as he is requiring more help. Palliative care on board. I would recommend hospice because irrespective of what treatment is offered, given his advanced age and malignancy, he might not have a very long time remaining. Plan discussed with the daughters over the phone.  Also discussed with Dr. Broadus Dezi.  -- Amie Portland, MD Neurologist [facility] Pager: [phone]

## 2020-11-18 NOTE — Consult Note (Signed)
Palliative Care Consult Note                                  Date: 11/18/2020   Patient Name: Charles Randall.  DOB: [birth year]  MRN: [id number]  Age / Sex: 87 y.o., male  PCP: Nickola Major, MD Referring Physician: Domenic Polite, MD  Reason for Consultation: Establishing goals of care  HPI/Patient Profile: Palliative Care consult requested for goals of care discussion in this 87 y.o. male  with past medical history of hemorrhagic stroke (07/2020) with left facial droop, seizure disorder, arthritis, left leg weakness, pretension, CKD stage III, degree AV block, left oculomotor nerve palsy, left renal mass.  He was admitted on 11/16/2020 from [facility] with aggressive weakness and decreased oral intake.  During work-up CT of head showed moderate atrophy.  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  Patient has been followed by neurology.  Past Medical History:  Diagnosis Date   Anemia    STARTED PILLS 1 MONTH AGO NEW DX   Aortic insufficiency    mild, moderate   Arthritis    FINGERS SWELL PT THINKS ARTHITIS, TOP OF WRIST ALSO SWELLS ONCE IN A WHILE   Bleeds easily (HCC)    BRUISE EASY ALSO   Chronic kidney disease    Cough    LAST 3 TO 4 WEEKS PHLEGM YELLOW INTERMITTENT COUGH NO FEVER   Dyslipidemia    H/O dizziness    OCC    H/O Doppler ultrasound 2010   H/O echocardiogram 2013   aortic valve disorder, EF =>55%   H/O echocardiogram 2012   AVD, EF =>55%   Heart murmur    Hemorrhagic stroke (HCC)    History of hiatal hernia    HTN (hypertension)    Inguinal hernia    Mitral regurgitation    mild   Pneumonia FEW YEARS AGO   X 1   Post-polio syndrome age 51   Seizures ([place])    last sz 2016 NO CAUSE FOUND, 3 seizures on 03/25/18    Swelling of both lower extremities    DOES NOT ALWAYS DO DOWN    Tricuspid regurgitation    mild      Subjective:   This NP Osborne Oman reviewed medical records, received report from team, assessed the patient and then met at the patient's bedside with Mr. Sprung and his two daughters, Tammy Elders and Angie Boitnotte to discuss diagnosis, prognosis, GOC, EOL wishes disposition and options.  Patient is sitting up in recliner.  He is awake, alert and oriented x3.  No acute distress noted.  Denies pain or shortness of breath.   Concept of Palliative Care was introduced as specialized medical care for people and their families living with serious illness.  It focuses on providing relief from the symptoms and stress of a serious illness.  The goal is to improve quality of life for both the patient and the family. Values and goals of care important to patient and family were attempted to be elicited.  I created space and opportunity for patient and family to explore state of health prior to admission, thoughts, and feelings. Prior to admission patient had recently become a resident at [facility] since 10/2020 after spending time at [facility] for rehab. Family shares patient was doing fairly well however over the past  several weeks has began to rapidly decline. Daughters share patient was unable to feed himself due to shaking.   Patient's appetite has significantly declined over the past 3-6 months per family. Mr. Ditullio states he has lost at least 30lbs over the this time and does not have much of an appetite. His lunch tray was at the bedside. He had only taken a few bites and sips.   We discussed His current illness and what it means in the larger context of His on-going co-morbidities. Natural disease trajectory and expectations were discussed.  Patient and his daughters verbalized understanding of current illness and co-morbidities. 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  Patient and family have opted for no further work-up given his age and decreased quality of  life.  Family shares patient was mowing his lawn and able to perform ADLs independently back in January/February.  Patient states he does not wish to undergo work-up as he has no intentions of proceeding with any types of treatment (chemotherapy/radiation).  Questions and concerns were addressed. The family was encouraged to call with questions or concerns.  PMT will continue to support holistically as needed.  Life Review: Patient has been a resident at [facility] since 11/03/2020.  6 months prior patient was living in the home with his significant other of more than 25 years.  He has 2 daughters.  He served in the [employer].  He retired after working more than 35 years for [employer].  He enjoys crossword puzzles.   Objective:   Primary Diagnoses: Present on Admission:  Acute encephalopathy  Benign essential hypertension  CKD (chronic kidney disease), stage III (HCC)  Hemorrhagic stroke (HCC)  Tachy-brady syndrome (HCC)   Scheduled Meds:  carbamazepine  100 mg Oral BID   levETIRAcetam  1,500 mg Oral BID    Continuous Infusions:  sodium chloride 50 mL/hr at 11/18/20 1052    PRN Meds: acetaminophen **OR** acetaminophen, guaiFENesin, ondansetron **OR** ondansetron (ZOFRAN) IV  No Known Allergies  Review of Systems  Constitutional:  Positive for appetite change and fatigue.  Neurological:  Positive for weakness.  Unless otherwise noted, a complete review of systems is negative.  Physical Exam General: NAD,  cachectic frail chronically-ill appearing Cardiovascular: regular rate and rhythm Pulmonary: diminished bilaterally  Abdomen: soft, nontender, + bowel sounds Extremities: no edema, no joint deformities Skin: no rashes, warm and dry Neurological: Left facial droop, AAOx3, mood appropriate  Vital Signs:  BP (!) 108/56 (BP Location: Right Arm)   Pulse 86   Temp 98.5 F (36.9 C) (Oral)   Resp 20   Ht $R'5\' 6"'nm$  (1.676 m)   SpO2 92%   BMI 22.60 kg/m  Pain  Scale: 0-10   Pain Score: 0-No pain  SpO2: SpO2: 92 % O2 Device:SpO2: 92 % O2 Flow Rate: .   IO: Intake/output summary:  Intake/Output Summary (Last 24 hours) at 11/18/2020 1346 Last data filed at 11/17/2020 2043 Gross per 24 hour  Intake 100 ml  Output --  Net 100 ml    LBM: Last BM Date: 11/17/20 Baseline Weight:   Most recent weight:        Palliative Assessment/Data: PPS 20%   Advanced Care Planning:   Primary Decision Maker: HCPOA  Code Status/Advance Care Planning: DNR  A discussion was had today regarding advanced directives. Concepts specific to code status, artifical feeding and hydration, continued IV antibiotics and rehospitalization was had.  Patient has an advanced directive and his daughter Lynelle Smoke is his medical decision-maker  with support from her Sister Janace Hoard (who lives in [place]).  Patient and family confirms DNR/DNI.  The difference between a aggressive medical intervention path and a palliative comfort care path was discussed.  Family would like to continue to treat the treatable while hospitalized with a focus on patient's comfort at discharge. The MOST form was reviewed and discussed.  Patient has current form on file however recommendations provided to update given goals of care changes. Patient and family requested to update MOST form. The patient and family outlined their wishes for the following treatment decisions:  Cardiopulmonary Resuscitation: Do Not Attempt Resuscitation (DNR/No CPR)  Medical Interventions: Comfort Measures: Keep clean, warm, and dry. Use medication by any route, positioning, wound care, and other measures to relieve pain and suffering. Use oxygen, suction and manual treatment of airway obstruction as needed for comfort. Do not transfer to the hospital unless comfort needs cannot be met in current location.  Antibiotics: Determine use of limitation of antibiotics when infection occurs  IV Fluids: No IV fluids (provide other measures  to ensure comfort)  Feeding Tube: No feeding tube    Hospice and Palliative Care services outpatient were explained and offered. Patient and family verbalized their understanding and awareness of both palliative and hospice's goals and philosophy of care.  Family is requesting outpatient hospice support.  They are hopeful patient can return to [facility] and receive this assistance.  Daughter does share if they are able to make arrangements they would like to get patient home with their support in the near future from [facility]. Patient expresses he would like to spend his last moments in his home but understands if this does not happens.   Decisions/Changes to ACP: Updated MOST form DNR/DNI Patient and family would like to discharge back to ALF with hospice support, no rehospitalizations.  Assessment & Plan:   SUMMARY OF RECOMMENDATIONS   DNR/DNI/Full Code-as confirmed by patient and his daughters Continue with current plan of care, no escalation of care.  Patient and family declining further work-up for questionable CNS lymphoma. Patient and family clear and expressed goals to continue with current plan of care while hospitalized with a goal of eventually discharging back to [facility] with outpatient hospice support.  Patient request to spend what time he has left amongst family and friends outside of the hospital.  No re-hospitalizations.  We discussed poor overall prognosis. Although no treatment plans or work-up daughters would like to hear from Neurology on potential prognosis in the setting of no interventions.  PMT will continue to support and follow as needed. Please call team line with urgent needs.  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  Discharge Planning:  Family is hopeful patient can discharge back to [facility] without patient hospice support.     Patient and his daughters expressed understanding and was in agreement with this plan.   Time In: 1300 Time Out: 1410 Time Total: 70 min.   Visit consisted of counseling  and education dealing with the complex and emotionally intense issues of symptom management and palliative care in the setting of serious and potentially life-threatening illness.Greater than 50%  of this time was spent counseling and coordinating care related to the above assessment and plan.  Signed by:  Alda Lea, AGPCNP-BC Palliative Medicine Team  Phone: [phone] Pager: [phone] Amion: Bjorn Pippin   Thank you for allowing the Palliative Medicine Team to assist in the care of this patient. Please utilize secure chat with additional questions, if there is no response within 30 minutes please call the above phone number. Palliative Medicine Team providers are available by phone from 7am to 5pm daily and can be reached through the team cell phone.  Should this patient require assistance outside of these hours, please call the patient's attending physician.

## 2020-11-18 NOTE — Progress Notes (Addendum)
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  GOAL:   Patient will meet greater than or equal to 90% of their needs  MONITOR:   PO intake, Supplement acceptance  REASON FOR ASSESSMENT:   Malnutrition Screening Tool    ASSESSMENT:   87 yo male admitted from ALF with FTT. PMH includes seizure disorder, postpolio syndrome, TR, MR, HTN, HLD, CKD-3, CVA, left renal mass, recent CVA.  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   Family and patient wish to focus on comfort rather than invasive tests and aggressive treatment.  Palliative care consult pending.  Family at bedside during [facility] visit. Patient was sitting up in chair eating spaghetti and broccoli for lunch. He states that he was drinking protein shakes PTA because he knew he needed them. He does not like Ensure or Boost supplements, but was drinking Engineer, civil (consulting). He agreed to try magic cups with meals to increase protein and calorie intake.  Weight history reviewed. Most recent weight available is 63.5 kg on 07/19/20. Family states patient has lost a lot of weight over the past few months, unsure of exact amount of weight loss.   Labs reviewed.  Medications reviewed and include Keppra. IVF: NS at 50 ml/h.  NUTRITION - FOCUSED PHYSICAL EXAM:  Flowsheet Row Most Recent Value  Orbital Region Severe depletion  Upper Arm Region Severe depletion  Thoracic and Lumbar Region Severe depletion  Buccal Region Severe depletion  Temple Region Severe depletion  Clavicle Bone Region Severe depletion  Clavicle and Acromion Bone Region Severe depletion  Scapular Bone Region Severe depletion  Dorsal Hand  Severe depletion  Patellar Region Severe depletion  Anterior Thigh Region Severe depletion  Posterior Calf Region Severe depletion  Edema (RD Assessment) None  Hair Reviewed  Eyes Reviewed  Mouth Reviewed  Skin Reviewed  Nails Reviewed       Diet Order:   Diet Order             Diet regular Room service appropriate? Yes with Assist; Fluid consistency: Thin  Diet effective now                   EDUCATION NEEDS:   No education needs have been identified at this time  Skin:  Skin Assessment: Skin Integrity Issues: Skin Integrity Issues:: Stage II Stage II: sacrum  Last BM:  9/20  Height:   Ht Readings from Last 1 Encounters:  11/18/20 5\' 6"  (1.676 m)    Weight:   Wt Readings from Last 1 Encounters:  07/19/20 63.5 kg    BMI:  Body mass index is 22.6 kg/m.  Estimated Nutritional Needs:   Kcal:  1800-2000  Protein:  90-110 gm  Fluid:  1.8-2 L    Lucas Mallow, RD, LDN, CNSC Please refer to Amion for contact information.

## 2020-11-18 NOTE — Evaluation (Signed)
Occupational Therapy Evaluation Patient Details Name: Charles Randall. MRN: [id number] DOB: [birth year] Today's Date: 11/18/2020   History of Present Illness pt is a 87 y/o male admitted 9/19 with progressive weakness over the past week, AMS poor PO intake and cough.  PMHx, Post polio syn, seiqures, Heart valves regurgitation, stroke, arthritis, HTN,   Clinical Impression   Pt admitted with the above diagnoses and presents with below problem list. Pt will benefit from continued acute OT to address the below listed deficits and maximize independence with basic ADLs prior to d/c to venue below. For the months leading up to this admission pt has increasingly needed more assist with basic ADLs as his overall strength and balance was declining. Today pt needed +2 total A to squat-pivot from EOB to recliner. Max total assist for static sitting balance. Pt currently needs max-total assist with basic ADLs. Full session details below.        Recommendations for follow up therapy are one component of a multi-disciplinary discharge planning process, led by the attending physician.  Recommendations may be updated based on patient status, additional functional criteria and insurance authorization.   Follow Up Recommendations  Home health OT;Supervision/Assistance - 24 hour;Other (comment) (back to ALF if staff there able to provide current level of care)    Equipment Recommendations  None recommended by OT    Recommendations for Other Services       Precautions / Restrictions Precautions Precautions: Fall Precaution Comments: sacral decubitus      Mobility Bed Mobility Overal bed mobility: Needs Assistance Bed Mobility: Supine to Sit     Supine to sit: Max assist;Total assist     General bed mobility comments: cues for direction.  Truncal assist to come L and up via L elbow to sitting upright.  Pt need total assist for stability and scooting to get feet on the floor.,     Transfers Overall transfer level: Needs assistance Equipment used: 2 person hand held assist Transfers: Squat Pivot Transfers     Squat pivot transfers: Total assist;+2 physical assistance;+2 safety/equipment     General transfer comment: face to face assist for transfer.  L LE unable to assist and curled back behind the right mid transfer. R LE assisted minimally and was blocked for stabiliey.  Pt positioned on Lift pad in the recliner.    Balance Overall balance assessment: Needs assistance Sitting-balance support: Bilateral upper extremity supported;Single extremity supported;Feet supported Sitting balance-Leahy Scale: Zero Sitting balance - Comments: EOB x 8 min.  pt unable to significantly extend his neck without assist.  max total for balance.  Several trials of spinal/cervical extension with 2 person facilitation.                                   ADL either performed or assessed with clinical judgement   ADL Overall ADL's : Needs assistance/impaired Eating/Feeding: Maximal assistance;Sitting   Grooming: Total assistance;Maximal assistance   Upper Body Bathing: Maximal assistance;Sitting;Total assistance   Lower Body Bathing: Total assistance;Sitting/lateral leans;Bed level   Upper Body Dressing : Total assistance;Maximal assistance;Sitting   Lower Body Dressing: Total assistance;Sitting/lateral leans;Bed level   Toilet Transfer: Total assistance;+2 for safety/equipment;Squat-pivot;+2 for physical assistance   Toileting- Clothing Manipulation and Hygiene: Total assistance         General ADL Comments: Pt presents with significantly impaired activity tolerance and generalized weakness. Needs supported sitting for UB ADLs in seated position.  Vision         Perception     Praxis      Pertinent Vitals/Pain Pain Assessment: Faces Faces Pain Scale: Hurts a little bit Pain Location: general discomfort, back Pain Descriptors / Indicators:  Discomfort Pain Intervention(s): Monitored during session     Hand Dominance Right   Extremity/Trunk Assessment Upper Extremity Assessment Upper Extremity Assessment: Generalized weakness   Lower Extremity Assessment Lower Extremity Assessment: Defer to PT evaluation LLE Deficits / Details: weaker than right LE due to post polio syndrome in addition to weak from inactivity   Cervical / Trunk Assessment Cervical / Trunk Assessment: Kyphotic   Communication Communication Communication: No difficulties;Other (comment) (soft spoken  SLP on the team.)   Cognition Arousal/Alertness: Awake/alert Behavior During Therapy: Flat affect Overall Cognitive Status: Difficult to assess                                     General Comments       Exercises Exercises: Other exercises Other Exercises Other Exercises: hip/knee flex/ext AA/PROM exercise for warm up bilaterally Other Exercises: bicep/tricep/shd flexion/ext with graded resistance/assistance x10 reps bil   Shoulder Instructions      Home Living Family/patient expects to be discharged to:: Assisted living                                 Additional Comments: Lately has been a 2 person assist, lift to the chair and recently has not gotten OOB      Prior Functioning/Environment Level of Independence: Needs assistance  Gait / Transfers Assistance Needed: progressive weakness over the past few months per daughter's report. +2 assist for transfers. Mostly in bed the past couple of weeks. ADL's / Homemaking Assistance Needed: some assist with bathing.dressing. setup with eating until a few weeks ago when pt began having some difficulty maintaining food on utensil as weakness progressed (up to min A eating)            OT Problem List:        OT Treatment/Interventions: Self-care/ADL training;Therapeutic exercise;Energy conservation;DME and/or AE instruction;Therapeutic activities;Patient/family  education;Balance training    OT Goals(Current goals can be found in the care plan section) Acute Rehab OT Goals Patient Stated Goal: pt did not participate,  dtr wanted to get some strength back. OT Goal Formulation: With patient/family Time For Goal Achievement: 12/02/20 Potential to Achieve Goals: Fair ADL Goals Pt Will Perform Eating: with min assist;sitting Pt Will Transfer to Toilet: squat pivot transfer;with +2 assist;with mod assist;bedside commode Additional ADL Goal #1: Pt will complete bed mobility at mod A level to prepare for EOB/OOB ADLs.  OT Frequency: Min 2X/week   Barriers to D/C:            Co-evaluation PT/OT/SLP Co-Evaluation/Treatment: Yes Reason for Co-Treatment: For patient/therapist safety PT goals addressed during session: Strengthening/ROM;Mobility/safety with mobility OT goals addressed during session: ADL's and self-care;Strengthening/ROM      AM-PAC OT "6 Clicks" Daily Activity     Outcome Measure Help from another person eating meals?: A Lot Help from another person taking care of personal grooming?: Total Help from another person toileting, which includes using toliet, bedpan, or urinal?: Total Help from another person bathing (including washing, rinsing, drying)?: Total Help from another person to put on and taking off regular upper body clothing?: Total Help from another  person to put on and taking off regular lower body clothing?: Total 6 Click Score: 7   End of Session    Activity Tolerance: Patient limited by fatigue Patient left: in chair;with call bell/phone within reach;with chair alarm set;with family/visitor present  OT Visit Diagnosis: Unsteadiness on feet (R26.81);Muscle weakness (generalized) (M62.81);Pain                Time: 1040-1108 OT Time Calculation (min): 28 min Charges:  OT General Charges $OT Visit: 1 Visit OT Evaluation $OT Eval Moderate Complexity: [place], OT Acute Rehabilitation Services Pager:  [phone] Office: [phone]   Hortencia Pilar 11/18/2020, 12:52 PM

## 2020-11-18 NOTE — Evaluation (Signed)
Clinical/Bedside Swallow Evaluation Patient Details  Name: Charles Randall. MRN: [id number] Date of Birth: 06/16/1927  Today's Date: 11/18/2020 Time: SLP Start Time (ACUTE ONLY): 1629 SLP Stop Time (ACUTE ONLY): 0174 SLP Time Calculation (min) (ACUTE ONLY): 23 min  Past Medical History:  Past Medical History:  Diagnosis Date   Anemia    STARTED PILLS 1 MONTH AGO NEW DX   Aortic insufficiency    mild, moderate   Arthritis    FINGERS SWELL PT THINKS ARTHITIS, TOP OF WRIST ALSO SWELLS ONCE IN A WHILE   Bleeds easily (HCC)    BRUISE EASY ALSO   Chronic kidney disease    Cough    LAST 3 TO 4 WEEKS PHLEGM YELLOW INTERMITTENT COUGH NO FEVER   Dyslipidemia    H/O dizziness    OCC    H/O Doppler ultrasound 2010   H/O echocardiogram 2013   aortic valve disorder, EF =>55%   H/O echocardiogram 2012   AVD, EF =>55%   Heart murmur    Hemorrhagic stroke (HCC)    History of hiatal hernia    HTN (hypertension)    Inguinal hernia    Mitral regurgitation    mild   Pneumonia FEW YEARS AGO   X 1   Post-polio syndrome age 70   Seizures ([place])    last sz 2016 NO CAUSE FOUND, 3 seizures on 03/25/18    Swelling of both lower extremities    DOES NOT ALWAYS DO DOWN    Tricuspid regurgitation    mild   Past Surgical History:  Past Surgical History:  Procedure Laterality Date   COLONSCOPY  5 YRS AGO   DENTAL SURGERY  3-4 WEEKS AGO   FEMORAL HERNIA REPAIR Right 08/17/2017   Procedure: HERNIA REPAIR FEMORAL LAPAROSCOPIC;  Surgeon: Michael Boston, MD;  Location: [facility];  Service: General;  Laterality: Right;   INGUINAL HERNIA REPAIR Bilateral 08/17/2017   Procedure: LAPAROSCOPIC BILATERAL INGUINAL HERNIA REPAIR;  Surgeon: Michael Boston, MD;  Location: [facility];  Service: General;  Laterality: Bilateral;   INSERTION OF MESH Bilateral 08/17/2017   Procedure: INSERTION OF MESH;  Surgeon: Michael Boston, MD;  Location: [facility];  Service: General;  Laterality: Bilateral;   TOTAL HIP ARTHROPLASTY Left  02/13/2018   Procedure: HEMI  ARTHROPLASTY;  Surgeon: Paralee Cancel, MD;  Location: [facility];  Service: Orthopedics;  Laterality: Left;   HPI:  Pt is a Charles Randall who presented to the ED with progressive weakness and decreased oral intake. CT head showed moderate atrophy. 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 EEG noted nonspecific encephalopathy. Neurology consulted and family was more inclined on 9/21 to focus on comfort rather than invasive/agressive treatment. Pt failed [facility] swallow screen 9/20, BSE on 9/20 [facility] with a single episode of coughing; regular/thin rx without need for follow up. SLP reconsulted on 9/21 due to signs of aspiration with pills on 9/20. PMT 9/21: DNR, DNI, no escalation of care, goal of eventually discharging back to [facility] greens ALF with outpatient hospice support. 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    Assessment / Plan / Recommendation  Clinical Impression  Pt was seen for bedside swallow evaluation with his two daughters present. Pt's GOC were discussed and pt's family was in agreement with the evaluation. Pt and his family reported  coughing with thin liquids and with pills given with thin liquids since pt does not like taking pills with puree. All parties agreed that coughing with liquids and pills is not atypical for the pt, but that he has "gotten strangled" and coughed to the point of vomiting in the recent past. Pt's swallow was functional with solids, but a pharyngeal delay is suspected and signs of aspiration noted with 5/5 boluses of thin liquids via cup and straw. No s/sx of aspiration were noted with nectar thick liquids and pt reported increased ease with this consistency. Regular textures will be continued. Pt and family  have reported that they are amenable to trying nectar thick liquids to reduce frequency of coughing, but considering GOC, thin liquids will also be allowed if pt wishes. It was agreed that instrumental assessment will be deferred at this time, but SLP will follow briefly. SLP Visit Diagnosis: Dysphagia, unspecified (R13.10)    Aspiration Risk  Mild aspiration risk    Diet Recommendation Regular;Nectar-thick liquid   Liquid Administration via: Cup;Straw Medication Administration: Whole meds with puree (or whole with nectar thick liquids) Supervision: Staff to assist with self feeding    Other  Recommendations Oral Care Recommendations: Oral care BID    Recommendations for follow up therapy are one component of a multi-disciplinary discharge planning process, led by the attending physician.  Recommendations may be updated based on patient status, additional functional criteria and insurance authorization.  Follow up Recommendations None      Frequency and Duration min 2x/week  1 week       Prognosis Prognosis for Safe Diet Advancement: Fair Barriers to Reach Goals: Time post onset      Swallow Study   General Date of Onset: 11/17/20 HPI: Pt is a Charles Randall who presented to the ED with progressive weakness and decreased oral intake. CT head showed moderate atrophy. 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 EEG noted nonspecific encephalopathy. Neurology consulted and family was more inclined on 9/21 to focus on comfort rather than invasive/agressive treatment. Pt failed [facility] swallow screen 9/20, BSE on 9/20 [facility] with a single episode of coughing; regular/thin rx without need for follow up. SLP reconsulted on 9/21 due to signs of aspiration with pills on 9/20. PMT 9/21: DNR, DNI, no escalation of care, goal of eventually discharging back to [facility] greens ALF with outpatient hospice support. 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 Type of Study: Bedside Swallow Evaluation Previous Swallow Assessment: 11/17/20 Diet Prior to this Study: Regular;Thin liquids Temperature Spikes Noted: No Respiratory Status: Room air History of Recent Intubation: No Behavior/Cognition: Alert;Cooperative;Pleasant mood Oral Care Completed by SLP: Yes Oral Cavity - Dentition: Adequate natural dentition Vision: Functional for self-feeding Baseline Vocal Quality: Normal Volitional Cough: Strong Volitional Swallow: Able to elicit    Oral/Motor/Sensory Function Overall Oral Motor/Sensory Function: Within functional limits   Ice Chips Ice chips: Not tested   Thin Liquid Thin Liquid: Impaired Presentation: Straw;Cup Pharyngeal  Phase Impairments: Suspected delayed Swallow;Throat Clearing - Immediate;Cough - Immediate;Cough - Delayed    Nectar Thick Nectar Thick Liquid: Impaired Presentation: Straw Pharyngeal Phase Impairments: Suspected delayed Swallow   Honey Thick Honey Thick Liquid: Not tested   Puree Puree: Within functional limits Presentation: Spoon   Solid     Solid: Within functional limits  Royden Bulman I. Hardin Negus, [place], [place] Office number [phone] Pager [phone]  Horton Marshall 11/18/2020,5:31 PM

## 2020-11-18 NOTE — Progress Notes (Signed)
Neurology Progress Note   S:// Patient seen and examined. Daughter at bedside.  Remained quite sleepy overnight. Went for an MRI at 2 AM and had a coughing spell and had to come back. Went back for MRI again early morning. MRI findings concerning for multifocal diffusion and abnormal enhancement and bilateral basal ganglia, regional periventricular white matter and also white matter of the left inferior frontal gyrus highly suspicious for primary CNS lymphoma. Family has already been notified by the primary team of the potential CNS lymphoma and are more inclined to focus towards comfort rather than any invasive tests or aggressive treatment plans-which he has also expressed in his wishes to the family.  O:// Current vital signs: BP 140/60 (BP Location: Right Arm)   Pulse 90   Temp 99.7 F (37.6 C) (Oral)   Resp 20   Ht 5\' 6"  (1.676 m)   SpO2 93%   BMI 22.60 kg/m  Vital signs in last 24 hours: Temp:  [98.9 F (37.2 C)-99.7 F (37.6 C)] 99.7 F (37.6 C) (09/21 0636) Pulse Rate:  [90-104] 90 (09/21 0636) Resp:  [10-28] 20 (09/20 2230) BP: (84-140)/(55-70) 140/60 (09/21 0636) SpO2:  [93 %-99 %] 93 % (09/21 0636) Limited exam Is drowsy, opens eyes to voice.  Is able to tell me his name. Extremely dysarthric. Poor attention concentration Follows simple commands intermittently Pupils equal round reactive to light Difficulty check extraocular movements but does have partial left 3rd nerve palsy. Blinks to threat from both sides Facial asymmetry with possible left lower facial droop. He is at best 4 -/5 in upper extremities and barely 2/5 in lower extremities bilaterally. Sensation intact  General: Cachectic and slightly disheveled appearing HEENT: Normocephalic/atraumatic CVs: Regular rate rhythm Respiratory: Breathing well and saturating normally on room air Abdomen nontender nondistended  Medications  Current Facility-Administered Medications:    0.9 %  sodium chloride  infusion, , Intravenous, Continuous, Domenic Polite, MD, Last Rate: 75 mL/hr at 11/17/20 1628, New Bag at 11/17/20 1628   acetaminophen (TYLENOL) tablet 650 mg, 650 mg, Oral, Q6H PRN **OR** acetaminophen (TYLENOL) suppository 650 mg, 650 mg, Rectal, Q6H PRN, Alcario Drought, Jared M, DO   guaiFENesin (ROBITUSSIN) 100 MG/5ML solution 100 mg, 5 mL, Oral, Q4H PRN, Rise Patience, MD, 100 mg at 11/18/20 0335   levETIRAcetam (KEPPRA) IVPB 1500 mg/ 100 mL premix, 1,500 mg, Intravenous, Q12H, Alcario Drought, Jared M, DO, Last Rate: 400 mL/hr at 11/18/20 0634, 1,500 mg at 11/18/20 0634   ondansetron (ZOFRAN) tablet 4 mg, 4 mg, Oral, Q6H PRN **OR** ondansetron (ZOFRAN) injection 4 mg, 4 mg, Intravenous, Q6H PRN, Alcario Drought, Jared M, DO   phenytoin (DILANTIN) injection 50 mg, 50 mg, Intravenous, Q8H, Yadav, Priyanka O, MD, 50 mg at 11/18/20 0630  Labs CBC    Component Value Date/Time   WBC 4.8 11/18/2020 0423   RBC 2.78 (L) 11/18/2020 0423   RBC 2.75 (L) 11/18/2020 0423   HGB 8.8 (L) 11/18/2020 0423   HCT 27.6 (L) 11/18/2020 0423   PLT 134 (L) 11/18/2020 0423   MCV 99.3 11/18/2020 0423   MCH 31.7 11/18/2020 0423   MCHC 31.9 11/18/2020 0423   RDW 16.1 (H) 11/18/2020 0423   LYMPHSABS 0.5 (L) 11/16/2020 1621   MONOABS 1.4 (H) 11/16/2020 1621   EOSABS 0.0 11/16/2020 1621   BASOSABS 0.0 11/16/2020 1621    CMP     Component Value Date/Time   NA 139 11/18/2020 0423   K 3.6 11/18/2020 0423   CL 103 11/18/2020 0423  CO2 26 11/18/2020 0423   GLUCOSE 110 (H) 11/18/2020 0423   BUN 37 (H) 11/18/2020 0423   CREATININE 1.19 11/18/2020 0423   CREATININE 2.11 (H) 02/16/2016 1144   CALCIUM 10.4 (H) 11/18/2020 0423   PROT 5.1 (L) 11/16/2020 1621   ALBUMIN 2.6 (L) 11/16/2020 1621   AST 17 11/16/2020 1621   ALT 9 11/16/2020 1621   ALKPHOS 75 11/16/2020 1621   BILITOT 0.9 11/16/2020 1621   GFRNONAA 58 (L) 11/18/2020 0423   GFRAA 53 (L) 02/19/2018 1508    Imaging I have reviewed images in epic and the results  pertinent to this consultation are: MRI brain with and without contrast IMPRESSION: 1. Unresolved since June, progressive, and now multifocal abnormal diffusion and enhancement in the bilateral basal ganglia, regional periventricular white matter, and also white matter of the left inferior frontal gyrus. This constellation is highly suspicious for Primary CNS Lymphoma, particularly large B-cell lymphoma which frequently can mimic ischemia. Recommend Neuro-Oncology consultation. 2. No significant intracranial mass effect. No other acute intracranial abnormality.  Abnormal CT head-see detailed radiology report  CT angiography chest abdomen pelvis with left kidney posterior upper pole mass concerning for neoplasm.  Assessment and recommendations: Subacute decline over the last few months with possible concern for breakthrough seizures. Significant weight loss, dysarthria and dysphagia along with imaging findings concerning for possible CNS lymphoma and neoplastic process in the kidney is most likely to blame for his current presentation. His imaging findings suggest that the abnormality seen in June on his brain MRI, which were thought to be stroke are probably the CNS lymphoma as those abnormalities are persistent and progressive on the most recent MRI done this morning. Family is clear that he would want to focus on comfort and would not want any aggressive treatment or invasive procedures. I have reached out to the on-call neuro oncologist for a curbside discussion on prognosis and treatment options. Steroids remain an option with or without further investigations to see if that provides any improvement. I will update once I discussed this with neuro-oncology. Had a detailed discussion with the daughter at bedside and another daughter on the phone. I have relayed my plan to Dr. Broadus Marcio.  -- Amie Portland, MD Neurologist [facility] Pager: [phone]

## 2020-11-18 NOTE — Evaluation (Signed)
Physical Therapy Evaluation Patient Details Name: Charles Randall. MRN: [id number] DOB: 1927-09-11 Today's Date: 11/18/2020  History of Present Illness  pt is a 87 y/o male admitted 9/19 with progressive weakness over the past week, AMS poor PO intake and cough.  PMHx, Post polio syn, seiqures, Heart valves regurgitation, stroke, arthritis, HTN,  Clinical Impression  Pt admitted with/for AMS/FTT.  Pt currently limited functionally due to the problems listed. ( See problems list.)   Pt will benefit from PT to maximize function and safety in order to get ready for next venue listed below.        Recommendations for follow up therapy are one component of a multi-disciplinary discharge planning process, led by the attending physician.  Recommendations may be updated based on patient status, additional functional criteria and insurance authorization.  Follow Up Recommendations No PT follow up;Other (comment);Home health PT (dependent on if pt bounces back from present status)    Equipment Recommendations  None recommended by PT    Recommendations for Other Services       Precautions / Restrictions Precautions Precautions: Fall Precaution Comments: sacral decubitus      Mobility  Bed Mobility Overal bed mobility: Needs Assistance Bed Mobility: Supine to Sit     Supine to sit: Max assist;Total assist     General bed mobility comments: cues for direction.  Truncal assist to come L and up via L elbow to sitting upright.  Pt need total assist for stability and scooting to get feet on the floor.,    Transfers Overall transfer level: Needs assistance Equipment used: 2 person hand held assist Transfers: Squat Pivot Transfers     Squat pivot transfers: Total assist;+2 physical assistance;+2 safety/equipment     General transfer comment: face to face assist for transfer.  L LE unable to assist and curled back behind the right mid transfer. R LE assisted minimally and was blocked for  stabiliey.  Pt positioned on Lift pad in the recliner.  Ambulation/Gait             General Gait Details: not applicable  Stairs            Wheelchair Mobility    Modified Rankin (Stroke Patients Only)       Balance Overall balance assessment: Needs assistance Sitting-balance support: Bilateral upper extremity supported;Single extremity supported;Feet supported Sitting balance-Leahy Scale: Zero Sitting balance - Comments: EOB x 8 min.  pt unable to significantly extend his neck without assist.  max total for balance.  Several trials of spinal/cervical extension with 2 person facilitation.                                     Pertinent Vitals/Pain Pain Assessment: Faces Faces Pain Scale: Hurts a little bit Pain Location: general discomfort, back Pain Descriptors / Indicators: Discomfort Pain Intervention(s): Monitored during session;Limited activity within patient's tolerance    Home Living Family/patient expects to be discharged to:: Assisted living                 Additional Comments: Lately has been a 2 person assist, lift to the chair and recently has not gotten OOB    Prior Function Level of Independence: Needs assistance               Hand Dominance   Dominant Hand: Right    Extremity/Trunk Assessment   Upper Extremity Assessment Upper Extremity Assessment:  Generalized weakness    Lower Extremity Assessment Lower Extremity Assessment: Generalized weakness;LLE deficits/detail LLE Deficits / Details: weaker than right LE due to post polio syndrome in addition to weak from inactivity    Cervical / Trunk Assessment Cervical / Trunk Assessment: Kyphotic  Communication   Communication: No difficulties;Other (comment) (soft spoken  SLP on the team.)  Cognition Arousal/Alertness: Awake/alert Behavior During Therapy: Flat affect Overall Cognitive Status:  (NT formally)                                         General Comments      Exercises Other Exercises Other Exercises: hip/knee flex/ext AA/PROM exercise for warm up bilaterally Other Exercises: bicep/tricep/shd flexion/ext with graded resistance/assistance x10 reps bil   Assessment/Plan    PT Assessment Patient needs continued PT services  PT Problem List Decreased strength;Decreased activity tolerance;Decreased balance;Decreased mobility;Decreased knowledge of precautions       PT Treatment Interventions Functional mobility training;Therapeutic activities;Therapeutic exercise;Balance training;Patient/family education    PT Goals (Current goals can be found in the Care Plan section)  Acute Rehab PT Goals Patient Stated Goal: pt did not participate,  dtr wanted to get some strength back. PT Goal Formulation: With patient/family Time For Goal Achievement: 12/02/20 Potential to Achieve Goals: Good    Frequency Min 2X/week   Barriers to discharge        Co-evaluation PT/OT/SLP Co-Evaluation/Treatment: Yes Reason for Co-Treatment: For patient/therapist safety PT goals addressed during session: Strengthening/ROM;Mobility/safety with mobility OT goals addressed during session: ADL's and self-care;Strengthening/ROM       AM-PAC PT "6 Clicks" Mobility  Outcome Measure Help needed turning from your back to your side while in a flat bed without using bedrails?: Total Help needed moving from lying on your back to sitting on the side of a flat bed without using bedrails?: Total Help needed moving to and from a bed to a chair (including a wheelchair)?: Total Help needed standing up from a chair using your arms (e.g., wheelchair or bedside chair)?: Total Help needed to walk in hospital room?: Total Help needed climbing 3-5 steps with a railing? : Total 6 Click Score: 6    End of Session   Activity Tolerance: Patient tolerated treatment well;Patient limited by fatigue Patient left: in chair;with call bell/phone within reach;with  family/visitor present;Other (comment) (on lift pad) Nurse Communication: Mobility status;Need for lift equipment PT Visit Diagnosis: Muscle weakness (generalized) (M62.81);Adult, failure to thrive (R62.7)    Time: 1032-1108 PT Time Calculation (min) (ACUTE ONLY): 36 min   Charges:   PT Evaluation $PT Eval Moderate Complexity: 1 Mod          11/18/2020  Ginger Carne., PT Acute Rehabilitation Services [phone]  (pager) [phone]  (office)  Tessie Fass Charles Randall 11/18/2020, 11:37 AM

## 2020-11-19 DIAGNOSIS — L89302 Pressure ulcer of unspecified buttock, stage 2: Secondary | ICD-10-CM

## 2020-11-19 DIAGNOSIS — G9340 Encephalopathy, unspecified: Secondary | ICD-10-CM | POA: Diagnosis not present

## 2020-11-19 DIAGNOSIS — R5383 Other fatigue: Secondary | ICD-10-CM

## 2020-11-19 DIAGNOSIS — D649 Anemia, unspecified: Secondary | ICD-10-CM | POA: Diagnosis not present

## 2020-11-19 DIAGNOSIS — L8990 Pressure ulcer of unspecified site, unspecified stage: Secondary | ICD-10-CM | POA: Insufficient documentation

## 2020-11-19 DIAGNOSIS — N289 Disorder of kidney and ureter, unspecified: Secondary | ICD-10-CM | POA: Diagnosis not present

## 2020-11-19 DIAGNOSIS — N1831 Chronic kidney disease, stage 3a: Secondary | ICD-10-CM | POA: Diagnosis not present

## 2020-11-19 DIAGNOSIS — I619 Nontraumatic intracerebral hemorrhage, unspecified: Secondary | ICD-10-CM | POA: Diagnosis not present

## 2020-11-19 MED ORDER — FAMOTIDINE 20 MG PO TABS
20.0000 mg | ORAL_TABLET | Freq: Every day | ORAL | Status: DC
  Administered 2020-11-19: 20 mg via ORAL
  Filled 2020-11-19: qty 1

## 2020-11-19 MED ORDER — GLYCOPYRROLATE 0.2 MG/ML IJ SOLN
0.3000 mg | INTRAMUSCULAR | Status: DC
  Administered 2020-11-19 (×2): 0.3 mg via INTRAVENOUS
  Filled 2020-11-19 (×2): qty 2

## 2020-11-19 MED ORDER — SCOPOLAMINE 1 MG/3DAYS TD PT72
1.0000 | MEDICATED_PATCH | TRANSDERMAL | Status: DC
  Administered 2020-11-19: 1.5 mg via TRANSDERMAL
  Filled 2020-11-19: qty 1

## 2020-11-19 MED ORDER — SCOPOLAMINE 1 MG/3DAYS TD PT72: 1.0000 | MEDICATED_PATCH | TRANSDERMAL | 0 refills | Status: AC

## 2020-11-19 NOTE — TOC Initial Note (Signed)
Transition of Care (TOC) - Initial/Assessment Note    Patient Details  Name: Teruo Stilley. MRN: [id number] Date of Birth: 1927-10-17  Transition of Care [facility]) CM/SW Contact:    Joanne Chars, LCSW Phone Number: 11/19/2020, 12:48 PM  Clinical Narrative:                 Pt unable to participate in assessment, CSW spoke with daughters Tammy and Angie in room.  Pt has been at [facility] since 9/7, was at [facility] for rehab prior to that.  They met with palliative and are in agreement with plan for comfort care at this time.  Preference would be residential hospice.  Choice document provided, they would like [facility].  Pt has LTC insurance and if pt is not meeting criteria for residential hospice, they do want to pursue facility placement for LTC with hospice outpt.  Pt has had covid vaccine but no booster.  PCP in place.    CSW spoke with Chrislyn at [facility] who will contact family and initiate assessment.    Expected Discharge Plan: Hospice Medical Facility Barriers to Discharge: Other (must enter comment) (residential hospice eval pending)   Patient Goals and CMS Choice   CMS Medicare.gov Compare Post Acute Care list provided to:: Patient Represenative (must comment) Choice offered to / list presented to : Adult Children  Expected Discharge Plan and Services Expected Discharge Plan: [place] In-house Referral: Clinical Social Work   Post Acute Care Choice: Hospice Living arrangements for the past 2 months: [place]                                      Prior Living Arrangements/Services Living arrangements for the past 2 months: [place] Lives with:: Facility Resident Patient language and need for interpreter reviewed:: No        Need for Family Participation in Patient Care: Yes (Comment) Care giver support system in place?: Yes (comment) Current home services: Other (comment) (na) Criminal  Activity/Legal Involvement Pertinent to Current Situation/Hospitalization: No - Comment as needed  Activities of Daily Living Home Assistive Devices/Equipment: None ADL Screening (condition at time of admission) Patient's cognitive ability adequate to safely complete daily activities?: No Is the patient deaf or have difficulty hearing?: No Does the patient have difficulty seeing, even when wearing glasses/contacts?: Yes Does the patient have difficulty concentrating, remembering, or making decisions?: No Patient able to express need for assistance with ADLs?: Yes Does the patient have difficulty dressing or bathing?: Yes Independently performs ADLs?: No Does the patient have difficulty walking or climbing stairs?: Yes Weakness of Legs: Both Weakness of Arms/Hands: Both  Permission Sought/Granted                  Emotional Assessment Appearance:: Appears stated age Attitude/Demeanor/Rapport: Unable to Assess Affect (typically observed): Unable to Assess Orientation: : Oriented to Self, Oriented to Place, Oriented to Situation Alcohol / Substance Use: Not Applicable Psych Involvement: No (comment)  Admission diagnosis:  Sick sinus syndrome (HCC) [I49.5] Seizures ([place]) [R56.9] Symptomatic anemia [D64.9] Other fatigue [R53.83] Patient Active Problem List   Diagnosis Date Noted   Pressure injury of skin 11/19/2020   Protein-calorie malnutrition, severe 11/18/2020   Seizures ([place]) 11/17/2020   Acute encephalopathy 11/17/2020   Tachy-brady syndrome ([place]) 11/17/2020   Kidney lesion, native, left 11/17/2020   Hemorrhagic stroke ([place]) 08/21/2020   Acute  diastolic CHF (congestive heart failure) (HCC)    History of hemiarthroplasty of left hip 02/13/2018   Closed fracture of neck of left femur ([place]) 02/12/2018   Moderate aortic regurgitation 02/12/2018   CKD (chronic kidney disease), stage III ([place]) 02/12/2018   Displaced fracture of left femoral neck ([place]) 02/12/2018   Left  displaced femoral neck fracture ([place]) 02/12/2018   Arterial insufficiency of lower extremity ([place]) 11/22/2017   Bilateral inguinal hernia (BIH) s/p lap hernia repair w mesh 08/17/2017 08/17/2017   Femoral hernia - right - s/p lap hernia repair w mesh 08/17/2017 08/17/2017   Dysphagia 04/06/2016   Partial symptomatic epilepsy with complex partial seizures, not intractable, without status epilepticus ([place]) 11/16/2015   Normocytic anemia 10/13/2015   Seizure disorder ([place]) 05/12/2015   Edema of lower extremity 04/09/2015   Benign essential hypertension 04/09/2015   Pleural effusion, right 04/09/2015   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

## 2020-11-19 NOTE — H&P (Addendum)
Engineer, maintenance [facility]) Hospital Liaison note.   Received request from [facility] manager for family interest in [facility] with request for transfer once medically stable. Chart reviewed and eligibility under review.  Spoke to daughter Lynelle Smoke who is POA to confirm interest and explain services. Family agreeable to transfer once bed available. CSW aware.    Registration paperwork will need to be completed once disposition in place. Dr. Orpah Melter to assume care per family request. TOC will be notified once consents completed by Tammy.   Please call report to [phone] once ready for discharge. Please call PTAR once consents completed.   Thank you,   Clementeen Hoof, RN, BSN [place] (listed on [place] under Hospice and [place] [facility] [phone]   [phone]

## 2020-11-19 NOTE — Progress Notes (Signed)
Pt Discharged to [facility] via [facility]. Report given to [facility]

## 2020-11-19 NOTE — Progress Notes (Signed)
Daily Progress Note   Patient Name: Charles Randall.       Date: 11/19/2020 DOB: 08/30/1927  Age: 87 y.o. MRN#: [id number] Attending Physician: Tawni Millers Primary Care Physician: Nickola Major, MD Admit Date: 11/16/2020  Reason for Consultation/Follow-up: Establishing goals of care  Subjective: Chart Reviewed. Updates Received. Patient Assessed at the bedside. Both of his daughters are present and tearful.   Patient has declined since my assessment on yesterday. He is lethargic with efforts to gain a response, audible congestion/cough, with continued poor nutrition. He has consumed only sips and bites over the past week but most noticeable over the past 48 hrs. He has not shown interest or been awake to tolerate oral nutrition on today.   Lengthy discussions with daughters and expectations on end-of-life. Daughters are tearful sharing their observation of his decline. Tammy shares yesterday evening patient expressed that his body was tired and spoke to them in regards to his funeral arrangements/wills etc. Emotional support provided.   We discussed given significant decline and no further work-up/interventions focusing on patient's comfort and hospice would be recommended. Family mutually agreed. Education provided on symptom management and comfort focused care.   Daughters are requesting [facility] as their preferred choice for hospice facility. If no bed shares they would be interested in the [place] location as well. Education provided on referral process for hospice home. Family verbalized understanding and appreciation.   All questions answered and support provided.   Length of Stay: 2 days  Vital Signs: BP 102/63 (BP Location: Right Arm)   Pulse 96   Temp 98.2 F (36.8 C) (Oral)   Resp 19   Ht 5\' 6"  (1.676 m)   SpO2 98%   BMI 22.60 kg/m  SpO2: SpO2: 98 % O2 Device: O2 Device: Room Air O2 Flow Rate:    Physical Exam: Lethargic, ill  appearing Tachycardic Coarse bilateral, audible congestion/cough Unable to assess mentation, will not follow commands               Palliative Care Assessment & Plan  HPI: Palliative Care consult requested for goals of care discussion in this 87 y.o. male  with past medical history of hemorrhagic stroke (07/2020) with left facial droop, seizure disorder, arthritis, left leg weakness, pretension, CKD stage III, degree AV block, left oculomotor nerve palsy, left renal mass.  He was admitted on 11/16/2020 from [place] with aggressive weakness and decreased oral intake.  During work-up CT of head showed moderate atrophy.  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  Patient has been followed by neurology.  Code Status: DNR  Goals of Care/Recommendations: All care to focus on comfort Education provided on EOL and hospice. Daughters have requested [place] consideration. Aware of referral process. Goals are clear no further work-up or interventions. Wish for patient to be comfortable for what time he has left. (TOC aware and assisting with hospice referral. I personally spoke with Chrislyn, RN and Melissa, RN with [facility])  PMT will continue to support and follow.  Please reach out if any questions arise for now Palliative will follow on as needed basis please call if any questions or concerns arise.    Symptom Management: Scopolamine patch for secretions Robinul for secretions Morphine prn for pain/air hunger  Prognosis: < 2 weeks in the setting of CNS lymphoma (declined further work-up or interventions) large progressive retroperitoneal mass, hemorrhagic stroke (07/2020), left facial droop,  CKD III, CAD, left renal mass, severely deconditioned, poor oral nutrition (sips and bites over the past week), weight loss of 30Lb over the past 3-6 mos, protein calorie malnutrition, sacral wound, lethargy.    Discharge Planning: Hospice facility  Thank you for allowing the Palliative Medicine Team to assist in the care of this patient.  Time Total: 50 min.   Visit consisted of counseling and education dealing with the complex and emotionally intense issues of symptom management and palliative care in the setting of serious and potentially life-threatening illness.Greater than 50%  of this time was spent counseling and coordinating care related to the above assessment and plan.  Alda Lea, AGPCNP-BC  Palliative Medicine Team [phone]

## 2020-11-19 NOTE — Consult Note (Signed)
[place] Nurse Consult Note: Patient receiving care in [facility] 2W20 Reason for Consult: Sacral wound Wound type: Stage 2 PI on the coccyx Pressure Injury POA: Yes Wound bed: 95% yellow 5% pink surrounded by pink periwound area Dressing procedure/placement/frequency: Cleanse the sacral area with no rinse cleanser, pat dry. Place a small piece a Aquacel Advantage Kellie Simmering # [phone]) over the open wound and secure with a sacral foam dressing placed upside down with the tip up to prevent soiling. Turn patient every 2 hours and replace mattress with a standard size air mattress.   Monitor the wound area(s) for worsening of condition such as: Signs/symptoms of infection, increase in size, development of or worsening of odor, development of pain, or increased pain at the affected locations.   Notify the medical team if any of these develop.  Pressure Injury Prevention Bundle May use any that apply to this patient. Support surfaces (air mattress) chair cushion Kellie Simmering # [phone]) Heel offloading boots Kellie Simmering # [id number]) Turning and Positioning  Measures to reduce shear (draw sheet, knees up) Skin protection Products (Foam dressing) Moisture management products (Critic-Aid Barrier Cream (Purple top) Sween moisturizing lotion (Pink top in clean supply) Nutrition Management Protection for Medical Devices Routine Skin Assessment   Thank you for the consult. [place] nurse will not follow at this time.   Please re-consult the [place] team if needed.  Cathlean Marseilles Tamala Julian, MSN, RN, [place], Lysle Pearl, [facility] Wound Treatment Associate Pager [phone]

## 2020-11-19 NOTE — Progress Notes (Addendum)
PROGRESS NOTE    [name]  UXN:[id number] DOB: 11-10-1927 DOA: 11/16/2020 PCP: Nickola Major, MD    Brief Narrative:  Mr. 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  Very debilitated and poor prognosis, no further malignancy work up.  87 yo male with the past medical history of postpolio syndrome,, hemorrhagic stroke, and seizures who presented with altered mental status.  He was noted to have progressive weakness over the last 7 days, associated with poor oral intake, and nonproductive cough.   Patient sleeping most of the time, less interactive. Using wheelchair for last few months.  On his initial physical examination blood pressure 114/69, heart rate 103, respirate 24, oxygen saturation 96%, patient was oriented to self and place, left-sided weakness, lungs are clear to auscultation bilaterally, heart S1-S2, present, rhythmic, soft abdomen, no lower extremity edema.  Sodium 136 potassium 3.6, chloride 100, bicarb 25, glucose 133, BUN 37, creatinine 1.26.  White count 6.6, hemoglobin 8.6, hematocrit 27.0, platelets 168. SARS COVID-19 negative.  Urinalysis specific gravity 1.025, 0-5 red cells, 0-5 white cells.  Head CT with no acute changes, moderate cerebral atrophy with extensive chronic microvascular ischemic changes and multiple old lacunar infarcts in the basal ganglia bilaterally.  Chest radiograph no infiltrates.  EKG 90 bpm, normal axis, normal intervals, manually corrected QTC 363, sinus rhythm with PVC, no significant ST segment or T wave changes.  Patient was admitted to the telemetry ward for further workup.  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 Abdomen MRI with large progressive retroperitoneal mass.   Considering patient's condition and poor prognosis his family decided to continue care under palliative measures, no further diagnostics.     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 Patient with very poor prognosis, he is very debilitated. Very poor oral intake, non ambulatory.  He has difficulty managing oral and upper airway secretions.   Plan. Continue supportive medical care. Considering his condition he is in high risk for aspiration, worsening pressure wound ulcer, and rapid decline.  Life expectancy likely is lees than 2 weeks.    Patient will benefit from residential hospice. Continue neuro checks per unit protocol and aspiration precautions.  Increase respiratory rate due to deconditioning.   2. CVA, hemorrhagic in June. Continue with aspirin. He has chronic left sided weakness.  3. Seizures. No signs of active seizures, continue with Keppra and carbamazepine.   4. Anemia of chronic disease. Hgb has been stable at 8.8, check cell count as needed.  Iron panel with serum iron at 50, tibc 175, transferrin saturation 29 and ferritin 1,501.   5. Tachy-brady heart rate has been in the 80 sinus rhythm.   6. Stage 2 pressure ulcer sacrum. Severe calorie protein malnutrition Present on admission. Follow up with wound care recommendations.  Continue with nutritional supplements as tolerated.   7. AKI on CKD stage 2. Renal function with serum cr at 1,19 with K at 3,6 and serum bicarbonate at 26. Patient with poor oral intake.   Status is: Inpatient  Remains inpatient appropriate because:Inpatient level of care appropriate due  to severity of illness  Dispo: The patient is from: ALF              Anticipated d/c is to:  to be determined               Patient currently is medically stable to d/c.    Difficult to place patient No   DVT prophylaxis: Enoxaparin   Code Status:    DNR   Family Communication:  I spoke with patient's dauhters at the bedside, we talked in detail about patient's condition, plan of care and prognosis and all questions were addressed.      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 (Active)  11/17/20 2200  Location: Sacrum  Location Orientation: Mid  Staging: Stage 2 -  Partial thickness loss of dermis presenting as a shallow open injury with a red, pink wound bed without slough.  Wound Description (Comments):   Present on Admission: Yes     Consultants:  Neurology  Palliative Care    Subjective: Patient very weak and deconditioned, very poor oral intake and somnolent. Denies any chest pain or dyspnea. Noted to have pain with passive movement.   Objective: Vitals:   11/19/20 0534 11/19/20 0746 11/19/20 0800 11/19/20 0847  BP: (!) 93/58 (!) 108/57 126/69   Pulse: 82 82 88   Resp: 19 (!) 30 (!) 22 (!) 25  Temp: 98.4 F (36.9 C)     TempSrc: Axillary     SpO2: 92%  92%   Height:        Intake/Output Summary (Last 24 hours) at 11/19/2020 0850 Last data filed at 11/19/2020 0700 Gross per 24 hour  Intake 1528.77 ml  Output 700 ml  Net 828.77 ml   There were no vitals filed for this visit.  Examination:   General: deconditioned and ill looking appearing  Neurology: somnolent but easy to arouse  E ENT: positive pallor, no icterus, oral mucosa dry.  Cardiovascular: No JVD. S1-S2 present, rhythmic, no gallops, rubs, or murmurs. No lower extremity edema. Pulmonary: positive breath sounds bilaterally with no  wheezing, upper airway rhonchi. no rales. Tachypnea but no use of accessory muscles.  Gastrointestinal. Abdomen soft and non tender Skin. Sacrum pressure ulcer quarter coin size, stage 2.  Musculoskeletal: no joint deformities      Data Reviewed: I have personally reviewed following labs and imaging studies  CBC: Recent Labs  Lab 11/16/20 1621 11/17/20 0554 11/18/20 0423  WBC 6.6 6.5 4.8  NEUTROABS 4.6  --   --   HGB 8.6* 8.3* 8.8*  HCT 27.0* 25.8* 27.6*  MCV 98.9 98.9 99.3  PLT 168 159 409*   Basic Metabolic Panel: Recent Labs  Lab 11/16/20 1621 11/17/20 0554 11/18/20 0423  NA 136 136 139  K 3.6 3.6 3.6  CL 100 99 103  CO2 25 25 26   GLUCOSE 133* 116* 110*  BUN 37* 36* 37*  CREATININE 1.26* 1.09 1.19  CALCIUM 10.1 10.4* 10.4*  MG  --  1.8  --    GFR: CrCl cannot be calculated (Unknown ideal weight.). Liver Function Tests: Recent Labs  Lab 11/16/20 1621  AST 17  ALT 9  ALKPHOS 75  BILITOT 0.9  PROT  5.1*  ALBUMIN 2.6*   Recent Labs  Lab 11/16/20 1621  LIPASE 21   No results for input(s): AMMONIA in the last 168 hours. Coagulation Profile: No results for input(s): INR, PROTIME in the last 168 hours. Cardiac Enzymes: No results for input(s): CKTOTAL, CKMB, CKMBINDEX, TROPONINI in the last 168 hours. BNP (last 3 results) No results for input(s): PROBNP in the last 8760 hours. HbA1C: No results for input(s): HGBA1C in the last 72 hours. CBG: No results for input(s): GLUCAP in the last 168 hours. Lipid Profile: No results for input(s): CHOL, HDL, LDLCALC, TRIG, CHOLHDL, LDLDIRECT in the last 72 hours. Thyroid Function Tests: Recent Labs    11/16/20 2153  TSH 4.636*  FREET4 1.13*   Anemia Panel: Recent Labs    11/18/20 0423  VITAMINB12 2,184*  FOLATE 12.3  FERRITIN 1,501*  TIBC 175*  IRON 50  RETICCTPCT 4.6*      Radiology Studies: I have reviewed all of the imaging during this hospital visit personally     Scheduled Meds:   carbamazepine  100 mg Oral BID   levETIRAcetam  1,500 mg Oral BID   Continuous Infusions:  sodium chloride Stopped (11/18/20 1624)     LOS: 2 days        Bay Jarquin Gerome Apley, MD

## 2020-11-19 NOTE — Discharge Summary (Addendum)
Physician Discharge Summary  Charles Randall. PZW:[id number] DOB: 05/24/1927 DOA: 11/16/2020  PCP: Charles Major, MD  Admit date: 11/16/2020 Discharge date: 11/19/2020  Admitted From: Home  Disposition:  SNF  Recommendations for Outpatient Follow-up and new medication changes:  Follow up with Dr, Daron Offer in 7 to 10 days.  Follow up with Hospice team   Home Health: na   Equipment/Devices: na    Discharge Condition: stable  CODE STATUS: DNR   Diet recommendation:  regular as tolerated, aspiration precautions.   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  Very debilitated and poor prognosis, no further malignancy work up.   87 yo male with the past medical history of postpolio syndrome,, hemorrhagic stroke, and seizures who presented with altered mental status.  He was noted to have progressive weakness over the last 7 days, associated with poor oral intake, and nonproductive cough.   Patient sleeping most of the time, less interactive. Using wheelchair for last few months.   On his initial physical examination blood pressure 114/69, heart rate 103, respiratory rate 24, oxygen saturation 96%, patient was oriented to self and place, left-sided weakness, lungs were clear to auscultation bilaterally, heart S1-S2, present, rhythmic, soft abdomen, no lower extremity edema.   Sodium 136 potassium 3.6, chloride 100, bicarb 25, glucose 133, BUN 37, creatinine 1.26.  White count 6.6, hemoglobin 8.6, hematocrit 27.0, platelets 168. SARS COVID-19 negative.   Urinalysis specific gravity 1.025, 0-5 red cells, 0-5 white cells.   Head CT with no acute changes, moderate cerebral atrophy with extensive chronic microvascular ischemic changes and multiple old lacunar infarcts in the basal ganglia bilaterally.   Chest radiograph no infiltrates.   EKG 90 bpm, normal axis, normal intervals, manually corrected QTC 363, sinus rhythm  with PVC, no significant ST segment or T wave changes.   Patient was admitted to the telemetry ward for further workup.   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 Abdomen MRI with large progressive retroperitoneal mass.    Considering patient's condition and poor prognosis his family decided to continue care under palliative measures, no further diagnostics.        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 Patient with very poor prognosis, he is very debilitated. Very poor oral intake, non ambulatory.  He has difficulty managing oral and upper airway secretions.    Plan. Continue supportive medical care. Considering his condition he is in high risk for aspiration, worsening pressure wound ulcer, and rapid decline.  Life expectancy likely is lees than 2 weeks.     Patient being transferred to residential hospice today.  Tumor board will review his case, but considering his very poor physical functional status likely not currently candidate for cancer specific therapy.    2. CVA, hemorrhagic in June. He has chronic left sided weakness. Poor prognosis will hold on aspirin for now.    3. Seizures. No signs of active seizures, continue with Keppra and carbamazepine.    4. Anemia of chronic disease. Hgb has been stable at 8.8, check cell count as needed.  Iron panel with serum iron at 50, tibc 175, transferrin saturation 29 and ferritin 1,501.    5. Tachy-brady heart rate has been in the 80 sinus rhythm.    6. Stage 2 pressure ulcer sacrum. Severe calorie protein malnutrition Present on admission. Follow up with wound care recommendations.  Continue with nutritional supplements as tolerated.    7.  AKI on CKD stage 2. Renal function with serum cr at 1,19 with K at 3,6 and serum bicarbonate at 26. Patient with poor oral intake.    Discharge Diagnoses:  Principal Problem:   Acute encephalopathy Active Problems:   Benign essential  hypertension   Seizure disorder (HCC)   CKD (chronic kidney disease), stage III (HCC)   Hemorrhagic stroke (HCC)   Seizures (HCC)   Tachy-brady syndrome (HCC)   Kidney lesion, native, left   Protein-calorie malnutrition, severe   Pressure injury of skin    Discharge Instructions  Discharge Instructions     Diet - low sodium heart healthy   Complete by: As directed    Discharge wound care:   Complete by: As directed    Cleanse the sacral area with no rinse cleanser, pat dry, Place small piece Aquacel Advantage( Lawson# [id number]) over the open wound and secure with a sacral foam dressing placed upside down with the tip up to prevent soiling. Change Aquacel daily. Turn patient every 2 hours and replace mattress with a standard size air mattress.  Place a heel foam dressing to both heels along with bilateral Prevalon heel lift boots.   Increase activity slowly   Complete by: As directed       Allergies as of 11/19/2020   No Known Allergies      Medication List     STOP taking these medications    aspirin EC 81 MG tablet   ezetimibe 10 MG tablet Commonly known as: ZETIA   magnesium oxide 400 MG tablet Commonly known as: MAG-OX       TAKE these medications    carbamazepine 100 MG 12 hr capsule Commonly known as: CARBATROL Take 1 capsule (100 mg total) by mouth 2 (two) times daily.   levETIRAcetam 750 MG tablet Commonly known as: KEPPRA Take 2 tablets (1,500 mg total) by mouth 2 (two) times daily.   NON FORMULARY Take 120 mLs by mouth 2 (two) times daily at 8am and 2pm. House supplement beverage drink   pantoprazole 40 MG tablet Commonly known as: PROTONIX Take 1 tablet (40 mg total) by mouth daily.   scopolamine 1 MG/3DAYS Commonly known as: TRANSDERM-SCOP Place 1 patch (1.5 mg total) onto the skin every 3 (three) days for 15 days.   triamcinolone 0.147 MG/GM topical spray Commonly known as: KENALOG Apply 1 spray topically 2 (two) times daily as needed  (rash).   vitamin B-12 1000 MCG tablet Commonly known as: CYANOCOBALAMIN Take 1,000 mcg by mouth daily.   vitamin C 500 MG tablet Commonly known as: ASCORBIC ACID Take 1,000 mg by mouth daily.   Vitamin D-3 25 MCG (1000 UT) Caps Take 1,000 Units by mouth daily.   vitamin E 180 MG (400 UNITS) capsule Take 400 Units by mouth daily.               Discharge Care Instructions  (From admission, onward)           Start     Ordered   11/19/20 0000  Discharge wound care:       Comments: Cleanse the sacral area with no rinse cleanser, pat dry, Place small piece Aquacel Advantage( Lawson# [id number]) over the open wound and secure with a sacral foam dressing placed upside down with the tip up to prevent soiling. Change Aquacel daily. Turn patient every 2 hours and replace mattress with a standard size air mattress.  Place a heel foam dressing to both heels along with bilateral Prevalon  heel lift boots.   11/19/20 1357            No Known Allergies  Consultations: Neurology  Palliative Care   Procedures/Studies: DG Chest 2 View  Result Date: 11/16/2020 CLINICAL DATA:  87 year old male with history of weakness and altered mental status for 1 day. EXAM: CHEST - 2 VIEW COMPARISON:  Chest x-ray 08/21/2020. FINDINGS: Lung volumes are normal. No consolidative airspace disease. No pleural effusions. No pneumothorax. No pulmonary nodule or mass noted. Pulmonary vasculature and the cardiomediastinal silhouette are within normal limits. Atherosclerosis in the thoracic aorta. IMPRESSION: 1.  No radiographic evidence of acute cardiopulmonary disease. 2. Aortic atherosclerosis. Electronically Signed   By: Vinnie Langton M.D.   On: 11/16/2020 19:06   CT HEAD WO CONTRAST (5MM)  Result Date: 11/16/2020 CLINICAL DATA:  87 year old male with history of delirium. EXAM: CT HEAD WITHOUT CONTRAST TECHNIQUE: Contiguous axial images were obtained from the base of the skull through the vertex  without intravenous contrast. COMPARISON:  Head CT 10/29/2020. FINDINGS: Brain: Moderate cerebral atrophy. Patchy and confluent areas of decreased attenuation are noted throughout the deep and periventricular white matter of the cerebral hemispheres bilaterally, compatible with chronic microvascular ischemic disease. More well-defined low-attenuation in the right basal ganglia and left thalamus, compatible with old lacunar infarcts. No evidence of acute infarction, hemorrhage, hydrocephalus, extra-axial collection or mass lesion/mass effect. Vascular: No hyperdense vessel or unexpected calcification. Skull: Normal. Negative for fracture or focal lesion. Sinuses/Orbits: Extensive mucoperiosteal thickening noted in the left sphenoid sinus where there is also some air-fluid levels. Paranasal sinuses are otherwise well pneumatized. Bilateral mastoids are well pneumatized. Other: None. IMPRESSION: 1. No acute intracranial abnormalities. 2. Moderate cerebral atrophy with extensive chronic microvascular ischemic changes and multiple old lacunar infarcts in the basal ganglia bilaterally. 3. Sinusitis in the left sphenoid sinus with both chronic and potentially acute features. Clinical correlation for signs and symptoms of acute sinusitis is recommended. Electronically Signed   By: Vinnie Langton M.D.   On: 11/16/2020 19:42   CT HEAD WO CONTRAST (5MM)  Result Date: 10/30/2020  [facility] [address] [place], [place] [address] [phone] NEUROIMAGING REPORT STUDY DATE: 10/29/2020 PATIENT NAME: Charles Randall. DOB: January 14, 1928 MRN: [id number] ORDERING CLINICIAN: Andrey Spearman CLINICAL HISTORY: 87 year old patient with stroke for follow-up scan COMPARISON FILMS: MRI scan of the brain 08/21/2020 EXAM: CT head without contrast TECHNIQUE:Axial serial 5 mm sections obtained from the skull base to the vertex CONTRAST: None IMAGING SITE: [facility] FINDINGS: The brain parenchyma shows area  of hypodensity in the right lentiform nucleus and posterior limb of internal capsule along with a peripheral rim of slight hyperdensity compatible with evolving subacute infarct.  There are age-related changes of chronic small vessel disease and mild degree of generalized cerebral atrophy.  No acute abnormalities noted.  Age-appropriate calcific changes are noted in various structures.  Extra-axial brain structures appear normal.  Calvarium shows no abnormalities.  Paranasal sinuses show mild chronic inflammatory changes.   Abnormal CT scan of the head without contrast showing changes of subacute resolving hemorrhagic infarct in the right basal ganglia.  There are age-appropriate changes of chronic small vessel disease and generalized cerebral atrophy.  Compared with MRI scan dated 08/21/2020 only expected evolutionary changes are noted. INTERPRETING PHYSICIAN: Antony Contras, MD Certified in  Neuroimaging by [place] [employer] and [employer]  MR BRAIN W WO CONTRAST  Result Date: 11/18/2020 CLINICAL DATA:  87 year old male with delirium.  Weakness. EXAM: MRI HEAD WITHOUT AND WITH CONTRAST TECHNIQUE: Multiplanar, multiecho pulse sequences of the brain and surrounding structures were obtained without and with intravenous contrast. CONTRAST:  32mL GADAVIST GADOBUTROL 1 MMOL/ML IV SOLN COMPARISON:  Head CT 11/16/2020.  Brain MRI 08/21/2020. FINDINGS: Brain: Unresolved, and progressive signal abnormality in the bilateral basal ganglia and periventricular white matter since July including heterogeneous diffusion restriction, T2/FLAIR hyperintensity, and patchy postcontrast enhancement. Progression at all sites involvement seen in June, although still relatively mild regional mass effect. New central corpus callosum involvement. And new anterior left inferior frontal gyrus subcortical involvement. There is some associated hemosiderin or mineralization at each site except  perhaps the inferior frontal gyrus. And some of the right basal ganglia T1 signal is intrinsic. No thalamic, brainstem, or cerebellar involvement. No midline shift, ventriculomegaly, extra-axial collection or acute intracranial hemorrhage. Cervicomedullary junction and pituitary are within normal limits. Underlying chronic periventricular and scattered white matter T2 and FLAIR hyperintensity more typical of chronic small vessel disease. No cortical encephalomalacia. But there are small chronic infarcts in the bilateral cerebellum. No dural thickening. No other abnormal intracranial enhancement identified. Vascular: Randall intracranial vascular flow voids are stable. The Randall dural venous sinuses are enhancing and appear to be patent. Skull and upper cervical spine: Visible cervical spine degeneration is stable. Background bone marrow signal remains normal. Negative visible spinal cord. Sinuses/Orbits: Stable orbits.  Chronic left sphenoid sinus disease. Other: Mastoids remain well aerated. Visible internal auditory structures appear normal. Negative visible scalp and face. IMPRESSION: 1. Unresolved since June, progressive, and now multifocal abnormal diffusion and enhancement in the bilateral basal ganglia, regional periventricular white matter, and also white matter of the left inferior frontal gyrus. This constellation is highly suspicious for Primary CNS Lymphoma, particularly large B-cell lymphoma which frequently can mimic ischemia. Recommend Neuro-Oncology consultation. 2. No significant intracranial mass effect. No other acute intracranial abnormality. Electronically Signed   By: Genevie Ann M.D.   On: 11/18/2020 06:57   MR ABDOMEN W WO CONTRAST  Result Date: 11/17/2020 CLINICAL DATA:  87 year old male with suspected renal mass. EXAM: MRI ABDOMEN WITHOUT AND WITH CONTRAST TECHNIQUE: Multiplanar multisequence MR imaging of the abdomen was performed both before and after the administration of intravenous  contrast. CONTRAST:  4.16mL GADAVIST GADOBUTROL 1 MMOL/ML IV SOLN COMPARISON:  Comparison made with CT angiography of the chest, abdomen and pelvis from Jul 19, 2020. FINDINGS: Lower chest: Small effusions in the lung bases. No dense consolidative process. Limited assessment on MRI of this area. Hepatobiliary: No focal, suspicious hepatic lesion. No pericholecystic stranding. Cholelithiasis. No biliary duct dilation. Pancreas: Signs of pancreatic atrophy, no gross ductal dilation or inflammation. Mildly limited assessment due to respiratory motion. Spleen:  Normal size and contour without focal lesion. Adrenals/Urinary Tract:  Adrenal glands are normal. Symmetric renal enhancement. Perirenal and retroperitoneal nodules in the LEFT retroperitoneum largest 2.0 x 1.3 cm (image 35/19) RIGHT retroperitoneal nodules and masses inseparable from the RIGHT psoas (image 60/19) weakly enhancing and measuring 6.1 x 3.7 cm in this area. Larger RIGHT retroperitoneal mass displaces the colon measuring 7.8 x 4.8 cm and is incompletely imaged on the current study except in the coronal plane where it tracks along the iliopsoas musculature encompassing approximally 14 cm of the retroperitoneum in craniocaudal extent. No hydronephrosis despite these findings.  Renal vein is patent. Stomach/Bowel: Limited assessment of gastrointestinal structures without acute process. Displacement of the colon in the RIGHT abdomen due to retroperitoneal masses. Vascular/Lymphatic: No pathologically enlarged lymph nodes identified. No  abdominal aortic aneurysm demonstrated. Aortic atherosclerosis. Other:  No ascites. Musculoskeletal: No suspicious bone lesions identified. IMPRESSION: Retroperitoneal masses and nodules greatest on the RIGHT, findings are suspicious for lymphoproliferative disorder primarily though are of uncertain significance and are hypovascular based on current MR assessment. Metastatic process is also considered. No solid organ  involvement, retroperitoneal adenopathy, discrete lymph nodes in the retroperitoneum or signs of splenic enlargement. Constellation of findings potentially confounding when considering the differential. Ultimately biopsy may be necessary for further evaluation as warranted. PET could also be considered on follow-up to assess for additional sites of disease or potential site of primary neoplasm. Cholelithiasis. Small effusions in the lung bases. Electronically Signed   By: Zetta Bills M.D.   On: 11/17/2020 13:16   EEG adult  Result Date: 11/17/2020 Lora Havens, MD     11/17/2020 12:15 PM Patient Name: Charles Randall. MRN: [id number] Epilepsy Attending: Lora Havens Referring Physician/Provider: Dr Lesleigh Noe Date: 11/17/2020 Duration: 26.13 mins Patient history: 87yo m with h/o seizure now with worsening mental status and possible tongue bite. EEG to evaluate for seizure Level of alertness: Awake, asleep AEDs during EEG study: CBZ, LEV Technical aspects: This EEG study was done with scalp electrodes positioned according to the 10-20 International system of electrode placement. Electrical activity was acquired at a sampling rate of 500Hz  and reviewed with a high frequency filter of 70Hz  and a low frequency filter of 1Hz . EEG data were recorded continuously and digitally stored. Description: The posterior dominant rhythm consists of 8-9Hz  activity of moderate voltage (25-35 uV) seen predominantly in posterior head regions, symmetric and reactive to eye opening and eye closing. Sleep was characterized by vertex waves, sleep spindles (12 to 14 Hz), maximal frontocentral region.  EEG showed intermittent generalized 3 to 6 Hz theta-delta slowing. Hyperventilation and photic stimulation were not performed.   ABNORMALITY - Intermittent slow, generalized IMPRESSION: This study suggestive of mild diffuse encephalopathy, nonspecific etiology. No seizures or epileptiform discharges were seen throughout the  recording. Priyanka Barbra Sarks     Subjective: Patient very weak and deconditioned, very poor oral intake and somnolent. Denies any chest pain or dyspnea. Noted to have pain with passive movement.   Discharge Exam: Vitals:   11/19/20 0847 11/19/20 1152  BP:  102/63  Pulse:  96  Resp: (!) 25 19  Temp:  98.2 F (36.8 C)  SpO2:  98%   Vitals:   11/19/20 0746 11/19/20 0800 11/19/20 0847 11/19/20 1152  BP: (!) 108/57 126/69  102/63  Pulse: 82 88  96  Resp: (!) 30 (!) 22 (!) 25 19  Temp:    98.2 F (36.8 C)  TempSrc:    Oral  SpO2:  92%  98%  Height:        General: deconditioned and ill looking appearing  Neurology: somnolent but easy to arouse  E ENT: positive pallor, no icterus, oral mucosa dry.  Cardiovascular: No JVD. S1-S2 present, rhythmic, no gallops, rubs, or murmurs. No lower extremity edema. Pulmonary: positive breath sounds bilaterally with no wheezing, upper airway rhonchi. no rales. Tachypnea but no use of accessory muscles.  Gastrointestinal. Abdomen soft and non tender Skin. Sacrum pressure ulcer quarter coin size, stage 2.  Musculoskeletal: no joint deformities     The results of significant diagnostics from this hospitalization (including imaging, microbiology, ancillary and laboratory) are listed below for reference.     Microbiology: Recent Results (from the past 240 hour(s))  Resp Panel by RT-PCR (Flu A&B,  Covid) Nasopharyngeal Swab     Status: None   Collection Time: 11/16/20  6:26 PM   Specimen: Nasopharyngeal Swab; Nasopharyngeal(NP) swabs in vial transport medium  Result Value Ref Range Status   SARS Coronavirus 2 by RT PCR NEGATIVE NEGATIVE Final    Comment: (NOTE) SARS-CoV-2 target nucleic acids are NOT DETECTED.  The SARS-CoV-2 RNA is generally detectable in upper respiratory specimens during the acute phase of infection. The lowest concentration of SARS-CoV-2 viral copies this assay can detect is 138 copies/mL. A negative result does not  preclude SARS-Cov-2 infection and should not be used as the sole basis for treatment or other patient management decisions. A negative result may occur with  improper specimen collection/handling, submission of specimen other than nasopharyngeal swab, presence of viral mutation(s) within the areas targeted by this assay, and inadequate number of viral copies(<138 copies/mL). A negative result must be combined with clinical observations, patient history, and epidemiological information. The expected result is Negative.  Fact Sheet for Patients:  EntrepreneurPulse.com.au  Fact Sheet for Healthcare Providers:  IncredibleEmployment.be  This test is no t yet approved or cleared by the [place] FDA and  has been authorized for detection and/or diagnosis of SARS-CoV-2 by FDA under an Emergency Use Authorization (EUA). This EUA will remain  in effect (meaning this test can be used) for the duration of the COVID-19 declaration under Section 564(b)(1) of the Act, 21 U.S.C.section 360bbb-3(b)(1), unless the authorization is terminated  or revoked sooner.       Influenza A by PCR NEGATIVE NEGATIVE Final   Influenza B by PCR NEGATIVE NEGATIVE Final    Comment: (NOTE) The Xpert Xpress SARS-CoV-2/FLU/RSV plus assay is intended as an aid in the diagnosis of influenza from Nasopharyngeal swab specimens and should not be used as a sole basis for treatment. Nasal washings and aspirates are unacceptable for Xpert Xpress SARS-CoV-2/FLU/RSV testing.  Fact Sheet for Patients: EntrepreneurPulse.com.au  Fact Sheet for Healthcare Providers: IncredibleEmployment.be  This test is not yet approved or cleared by the [place] FDA and has been authorized for detection and/or diagnosis of SARS-CoV-2 by FDA under an Emergency Use Authorization (EUA). This EUA will remain in effect (meaning this test can be used) for the  duration of the COVID-19 declaration under Section 564(b)(1) of the Act, 21 U.S.C. section 360bbb-3(b)(1), unless the authorization is terminated or revoked.  Performed at [facility], [place] [address] [place], [place] [address]   MRSA Next Gen by PCR, Nasal     Status: None   Collection Time: 11/17/20 10:21 PM   Specimen: Nasal Mucosa; Nasal Swab  Result Value Ref Range Status   MRSA by PCR Next Gen NOT DETECTED NOT DETECTED Final    Comment: (NOTE) The GeneXpert MRSA Assay (FDA approved for NASAL specimens only), is one component of a comprehensive MRSA colonization surveillance program. It is not intended to diagnose MRSA infection nor to guide or monitor treatment for MRSA infections. Test performance is not FDA approved in patients less than 69 years old. Performed at [facility], [place] [address] [place], [place] [address]      Labs: BNP (last 3 results) No results for input(s): BNP in the last 8760 hours. Basic Metabolic Panel: Recent Labs  Lab 11/16/20 1621 11/17/20 0554 11/18/20 0423  NA 136 136 139  K 3.6 3.6 3.6  CL 100 99 103  CO2 25 25 26   GLUCOSE 133* 116* 110*  BUN 37* 36* 37*  CREATININE 1.26* 1.09  1.19  CALCIUM 10.1 10.4* 10.4*  MG  --  1.8  --    Liver Function Tests: Recent Labs  Lab 11/16/20 1621  AST 17  ALT 9  ALKPHOS 75  BILITOT 0.9  PROT 5.1*  ALBUMIN 2.6*   Recent Labs  Lab 11/16/20 1621  LIPASE 21   No results for input(s): AMMONIA in the last 168 hours. CBC: Recent Labs  Lab 11/16/20 1621 11/17/20 0554 11/18/20 0423  WBC 6.6 6.5 4.8  NEUTROABS 4.6  --   --   HGB 8.6* 8.3* 8.8*  HCT 27.0* 25.8* 27.6*  MCV 98.9 98.9 99.3  PLT 168 159 134*   Cardiac Enzymes: No results for input(s): CKTOTAL, CKMB, CKMBINDEX, TROPONINI in the last 168 hours. BNP: Invalid input(s): POCBNP CBG: No results for input(s): GLUCAP in the last 168 hours. D-Dimer No results for input(s): DDIMER in the last 72 hours. Hgb A1c No  results for input(s): HGBA1C in the last 72 hours. Lipid Profile No results for input(s): CHOL, HDL, LDLCALC, TRIG, CHOLHDL, LDLDIRECT in the last 72 hours. Thyroid function studies Recent Labs    11/16/20 2153  TSH 4.636*   Anemia work up Recent Labs    11/18/20 0423  VITAMINB12 2,184*  FOLATE 12.3  FERRITIN 1,501*  TIBC 175*  IRON 50  RETICCTPCT 4.6*   Urinalysis    Component Value Date/Time   COLORURINE YELLOW 11/16/2020 2153   APPEARANCEUR CLEAR 11/16/2020 2153   LABSPEC 1.025 11/16/2020 2153   PHURINE 5.5 11/16/2020 2153   GLUCOSEU NEGATIVE 11/16/2020 2153   [place] 11/16/2020 2153   [place] 11/16/2020 2153   [place] 11/16/2020 2153   PROTEINUR 30 (A) 11/16/2020 2153   UROBILINOGEN 0.2 10/13/2010 1103   NITRITE NEGATIVE 11/16/2020 2153   LEUKOCYTESUR NEGATIVE 11/16/2020 2153   Sepsis Labs Invalid input(s): PROCALCITONIN,  WBC,  LACTICIDVEN Microbiology Recent Results (from the past 240 hour(s))  Resp Panel by RT-PCR (Flu A&B, Covid) Nasopharyngeal Swab     Status: None   Collection Time: 11/16/20  6:26 PM   Specimen: Nasopharyngeal Swab; Nasopharyngeal(NP) swabs in vial transport medium  Result Value Ref Range Status   SARS Coronavirus 2 by RT PCR NEGATIVE NEGATIVE Final    Comment: (NOTE) SARS-CoV-2 target nucleic acids are NOT DETECTED.  The SARS-CoV-2 RNA is generally detectable in upper respiratory specimens during the acute phase of infection. The lowest concentration of SARS-CoV-2 viral copies this assay can detect is 138 copies/mL. A negative result does not preclude SARS-Cov-2 infection and should not be used as the sole basis for treatment or other patient management decisions. A negative result may occur with  improper specimen collection/handling, submission of specimen other than nasopharyngeal swab, presence of viral mutation(s) within the areas targeted by this assay, and inadequate number of viral copies(<138  copies/mL). A negative result must be combined with clinical observations, patient history, and epidemiological information. The expected result is Negative.  Fact Sheet for Patients:  EntrepreneurPulse.com.au  Fact Sheet for Healthcare Providers:  IncredibleEmployment.be  This test is no t yet approved or cleared by the [place] FDA and  has been authorized for detection and/or diagnosis of SARS-CoV-2 by FDA under an Emergency Use Authorization (EUA). This EUA will remain  in effect (meaning this test can be used) for the duration of the COVID-19 declaration under Section 564(b)(1) of the Act, 21 U.S.C.section 360bbb-3(b)(1), unless the authorization is terminated  or revoked sooner.       Influenza A by PCR  NEGATIVE NEGATIVE Final   Influenza B by PCR NEGATIVE NEGATIVE Final    Comment: (NOTE) The Xpert Xpress SARS-CoV-2/FLU/RSV plus assay is intended as an aid in the diagnosis of influenza from Nasopharyngeal swab specimens and should not be used as a sole basis for treatment. Nasal washings and aspirates are unacceptable for Xpert Xpress SARS-CoV-2/FLU/RSV testing.  Fact Sheet for Patients: EntrepreneurPulse.com.au  Fact Sheet for Healthcare Providers: IncredibleEmployment.be  This test is not yet approved or cleared by the [place] FDA and has been authorized for detection and/or diagnosis of SARS-CoV-2 by FDA under an Emergency Use Authorization (EUA). This EUA will remain in effect (meaning this test can be used) for the duration of the COVID-19 declaration under Section 564(b)(1) of the Act, 21 U.S.C. section 360bbb-3(b)(1), unless the authorization is terminated or revoked.  Performed at [facility], [place] [address] [place], [place] [address]   MRSA Next Gen by PCR, Nasal     Status: None   Collection Time: 11/17/20 10:21 PM   Specimen: Nasal Mucosa; Nasal Swab  Result  Value Ref Range Status   MRSA by PCR Next Gen NOT DETECTED NOT DETECTED Final    Comment: (NOTE) The GeneXpert MRSA Assay (FDA approved for NASAL specimens only), is one component of a comprehensive MRSA colonization surveillance program. It is not intended to diagnose MRSA infection nor to guide or monitor treatment for MRSA infections. Test performance is not FDA approved in patients less than 76 years old. Performed at [facility], [place] [address] [place],  [address]      Time coordinating discharge: 45 minutes  SIGNED:   Tawni Millers, MD  [facility] 11/19/2020, 1:57 PM

## 2020-11-19 NOTE — Progress Notes (Signed)
SLP Cancellation Note  Patient Details Name: Charles Randall. MRN: [id number] DOB: 06-12-1927   Cancelled treatment:       Reason Eval/Treat Not Completed: Other (comment) Pt discharging with hospice care. SLP spoke with pt's daughter at bedside to assess whether she or her sister had any questions related to swallowing. She indicated that the pt's intake has been limited, but that the pt has been consuming the thickened liquids with less difficulty. Pt's daughter stated that she will take some of the pre-thickened cranberry juice which he likes and she expressed gratitude for some additional thickener packets which were provided by this SLP. SLP will sign off.   Noland Pizano I. Hardin Negus, [place], [place] Office number [phone] Pager [place] 11/19/2020, 3:16 PM

## 2020-11-19 NOTE — TOC Transition Note (Signed)
Transition of Care [facility]) - CM/SW Discharge Note   Patient Details  Name: Charles Randall. MRN: [id number] Date of Birth: [birth year]  [facility] CM/SW Contact:  Joanne Chars, LCSW Phone Number: 11/19/2020, 2:43 PM   Clinical Narrative:   Pt discharging to [facility].  RN call report to [phone].  PTAR has been scheduled for 4pm.     Final next level of care: Will Barriers to Discharge: Barriers Resolved   Patient Goals and CMS Choice   CMS Medicare.gov Compare Post Acute Care list provided to:: Patient Represenative (must comment) Choice offered to / list presented to : Adult Children  Discharge Placement              Patient chooses bed at:  [facility]) Patient to be transferred to facility by: Rafael Capo Name of family member notified: Tammy, Janace Hoard, daughters in room Patient and family notified of of transfer: 11/19/20  Discharge Plan and Services In-house Referral: Clinical Social Work   Post Acute Care Choice: Hospice                               Social Determinants of Health (SDOH) Interventions     Readmission Risk Interventions No flowsheet data found.

## 2020-11-28 DEATH — deceased

## 2021-01-27 ENCOUNTER — Ambulatory Visit: Payer: Medicare Other | Admitting: Diagnostic Neuroimaging

## 2021-06-19 IMAGING — CT CT ABD-PELV W/O CM
2 of 4 series · 14 of 46 positions shown, 16 images · non-contrast
Comparison: February 17, 2018.

CLINICAL DATA: Multiple falls.

EXAM:
CT CHEST, ABDOMEN AND PELVIS WITHOUT CONTRAST
TECHNIQUE: Multidetector CT imaging of the chest, abdomen and pelvis was
performed following the standard protocol without IV contrast.

[Series 2: cap without · axial · non-contrast · 0.66mm/px · z∈[-1345,-805]mm · 11 of 128 slices shown, 13 images]
[im 10/128  soft-tissue]
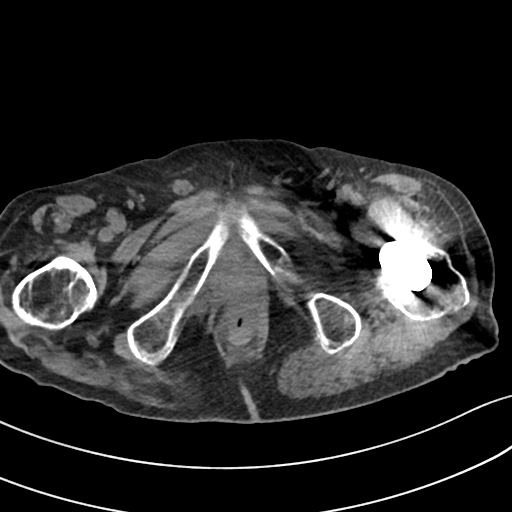
[im 10/128  bone]
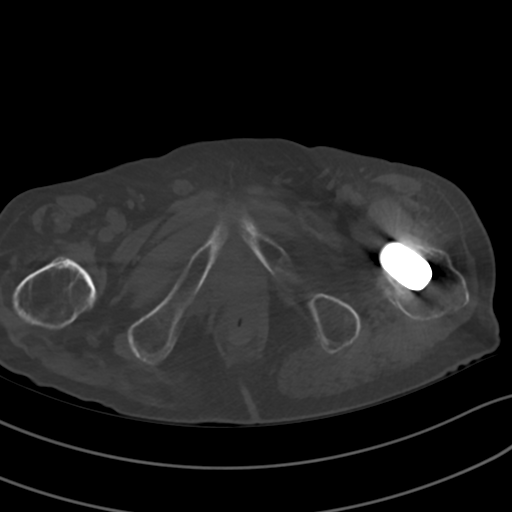
[im 19/128  soft-tissue]
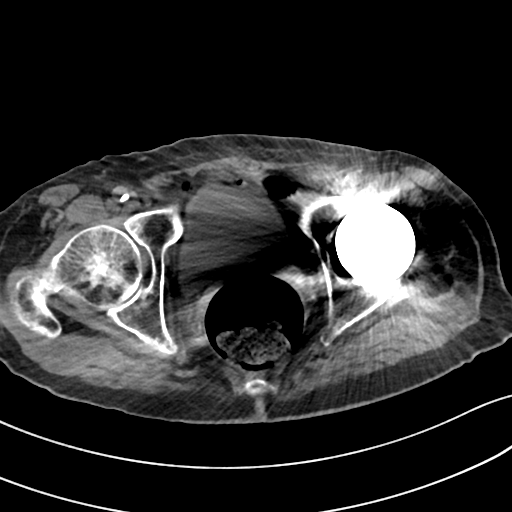
[im 28/128  soft-tissue]
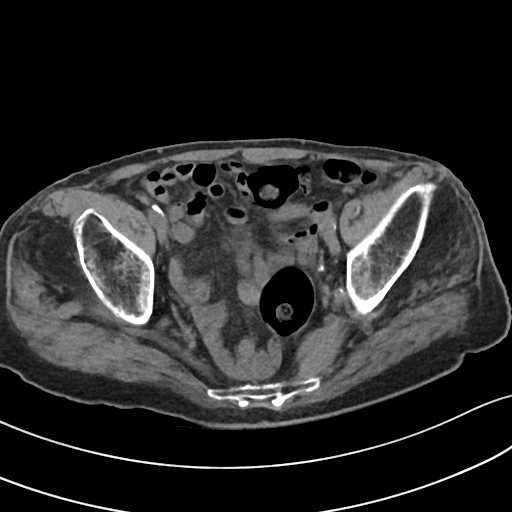
[im 46/128  soft-tissue]
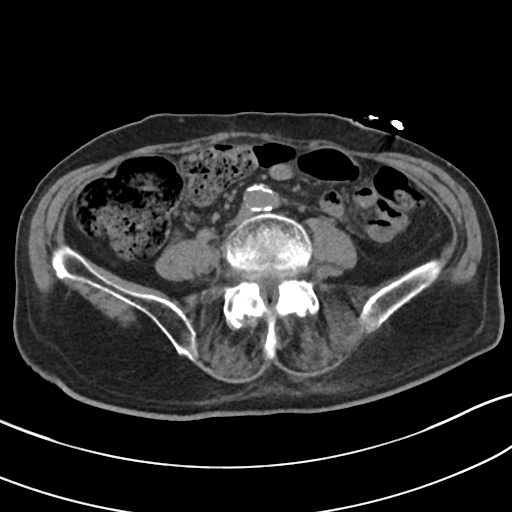
[im 55/128  soft-tissue]
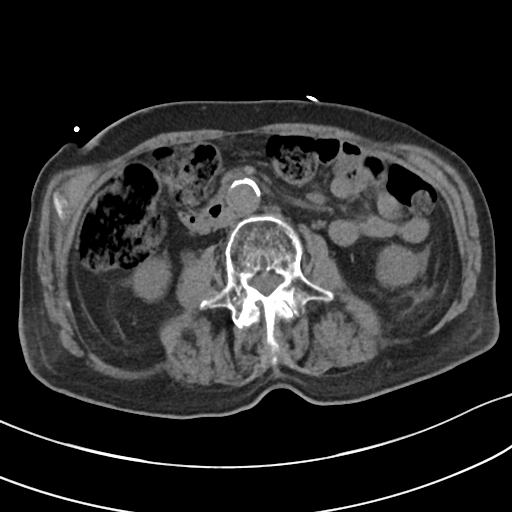
[im 64/128  soft-tissue]
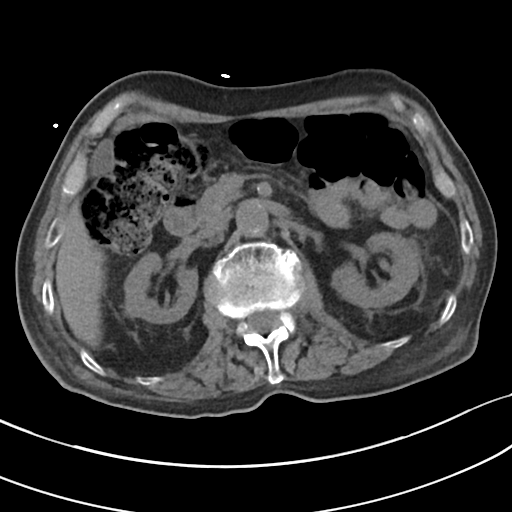
[im 73/128  soft-tissue]
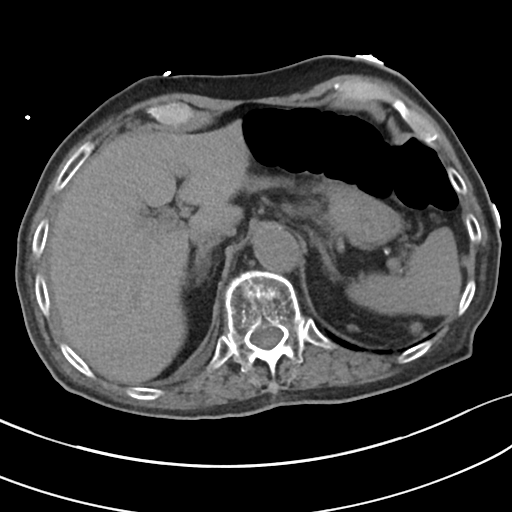
[im 82/128  soft-tissue]
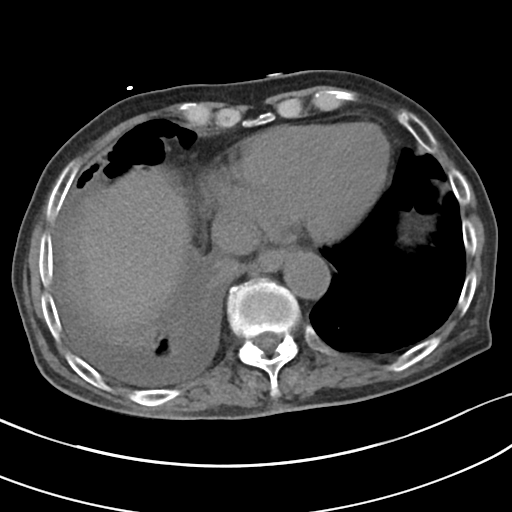
[im 100/128  soft-tissue]
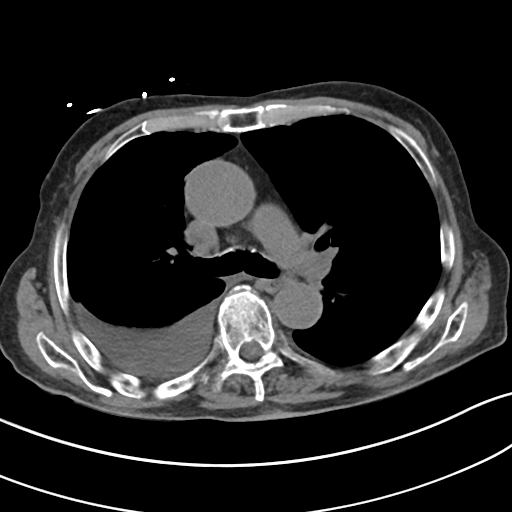
[im 100/128  bone]
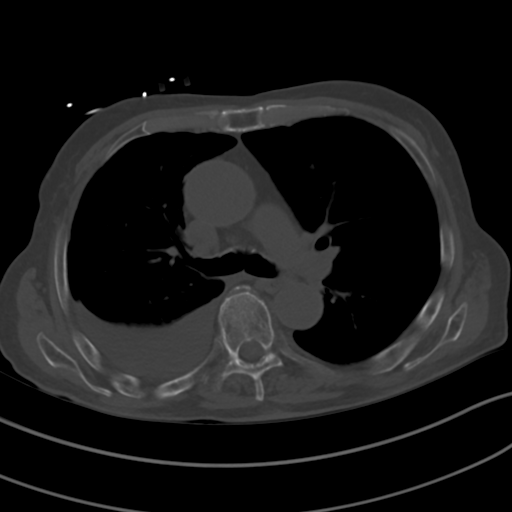
[im 109/128  soft-tissue]
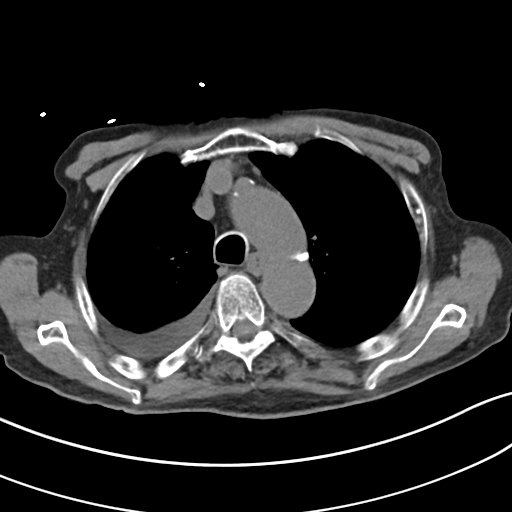
[im 118/128  soft-tissue]
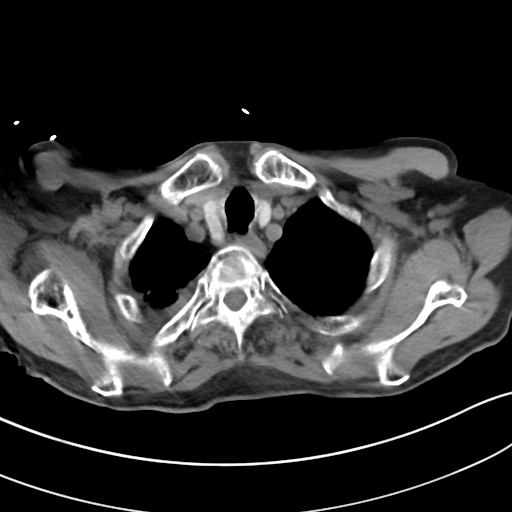

[Series 5: coronal · coronal · 0.75mm/px · 3 of 81 slices shown]
[im 27/81  soft-tissue]
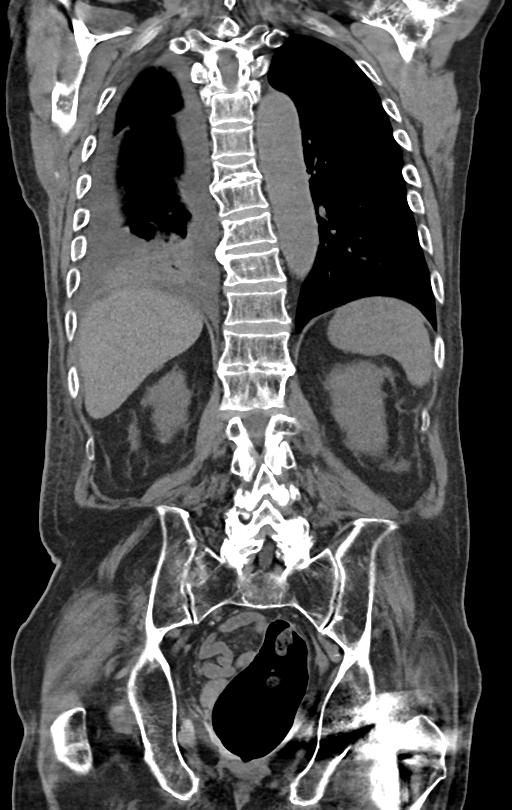
[im 36/81  soft-tissue]
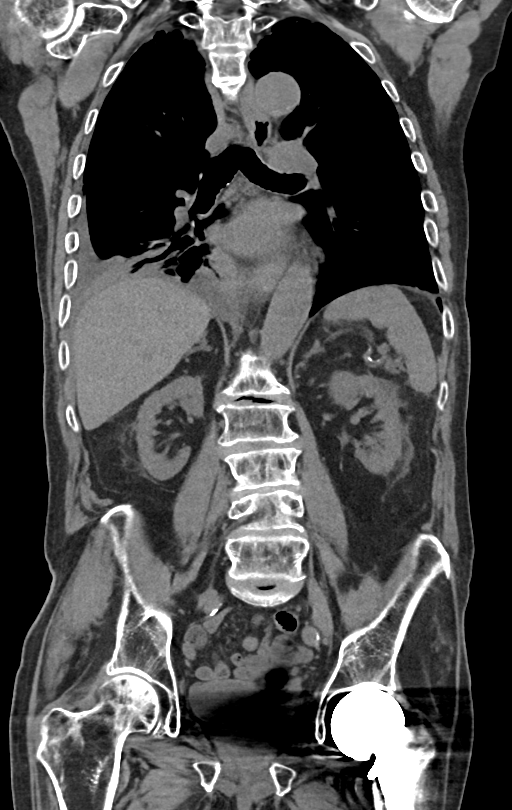
[im 45/81  soft-tissue]
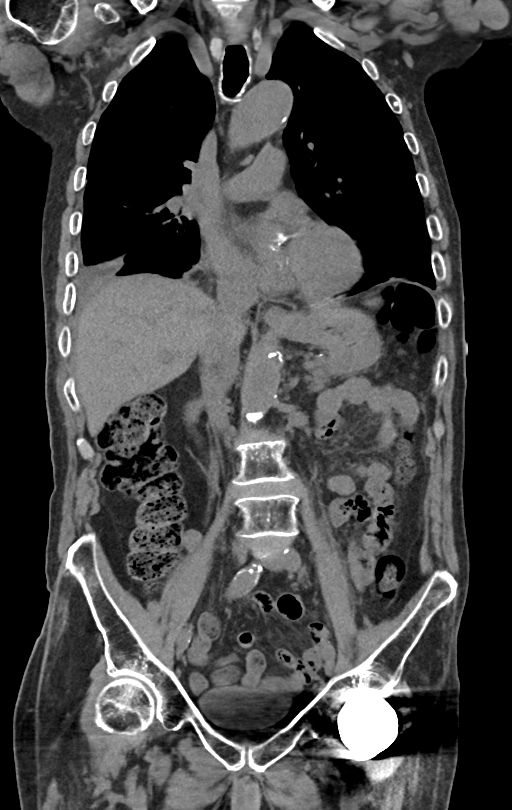

[14 of 46 positions shown; findings below may reference images not displayed]

FINDINGS: CT CHEST FINDINGS

Cardiovascular: Grossly stable 4.7 cm ascending thoracic aortic
aneurysm is noted. Atherosclerosis of thoracic aorta is noted.
Normal cardiac size. No pericardial effusion.

Mediastinum/Nodes: No enlarged mediastinal, hilar, or axillary lymph
nodes. Thyroid gland, trachea, and esophagus demonstrate no
significant findings.

Lungs/Pleura: No pneumothorax is noted. Moderate right pleural
effusion is noted with adjacent atelectasis of the right lower lobe.
Mild biapical scarring is noted.

Musculoskeletal: Stable old compression fractures of the thoracic
spine are noted. No acute osseous or chest wall abnormality is
noted.

CT ABDOMEN PELVIS FINDINGS

Hepatobiliary: No focal liver abnormality is seen. No gallstones,
gallbladder wall thickening, or biliary dilatation.

Pancreas: Unremarkable. No pancreatic ductal dilatation or
surrounding inflammatory changes.

Spleen: Normal in size without focal abnormality.

Adrenals/Urinary Tract: Adrenal glands appear normal. 19 x 13 mm
partially exophytic soft tissue abnormality is seen arising
posteriorly from the upper pole of left kidney. No hydronephrosis or
renal obstruction is noted. No renal or ureteral calculi are noted.
Urinary bladder is unremarkable.

Stomach/Bowel: Stomach is within normal limits. Appendix appears
normal. No evidence of bowel wall thickening, distention, or
inflammatory changes.

Vascular/Lymphatic: Aortic atherosclerosis. No enlarged abdominal or
pelvic lymph nodes.

Reproductive: Prostate is unremarkable.

Other: No abdominal wall hernia or abnormality. No abdominopelvic
ascites.

Musculoskeletal: Status post left hip arthroplasty. No acute osseous
abnormality is noted.
IMPRESSION: Grossly stable 4.7 cm ascending thoracic aortic aneurysm. Ascending
thoracic aortic aneurysm. Recommend semi-annual imaging followup by
CTA or MRA and referral to cardiothoracic surgery if not already
obtained. This recommendation follows 2232
ACCF/AHA/AATS/ACR/ASA/SCA/YOMARA/ALEXANDRU/AKISHIGE/AS Guidelines for the
Diagnosis and Management of Patients With Thoracic Aortic Disease.
Circulation. 2232; 121: [id number]. Aortic aneurysm NOS
([id number]-YPY.6).

Moderate right pleural effusion is noted with adjacent subsegmental
atelectasis of the right lower lobe.

19 x 13 mm partially exophytic soft tissue abnormality is seen
arising posteriorly from the upper pole of left kidney. Possible
neoplasm cannot be excluded. Further evaluation with MRI is
recommended.

Aortic Atherosclerosis ([id number]-7PT.T).

## 2021-07-22 IMAGING — CR DG CHEST 2V
2 series · 2 of 2 positions shown · non-contrast
Comparison: CTA chest, abdomen, and pelvis dated July 19, 2020.

CLINICAL DATA: Chest pain. Multiple falls this week.

EXAM:
CHEST - 2 VIEW

[w chest lat]
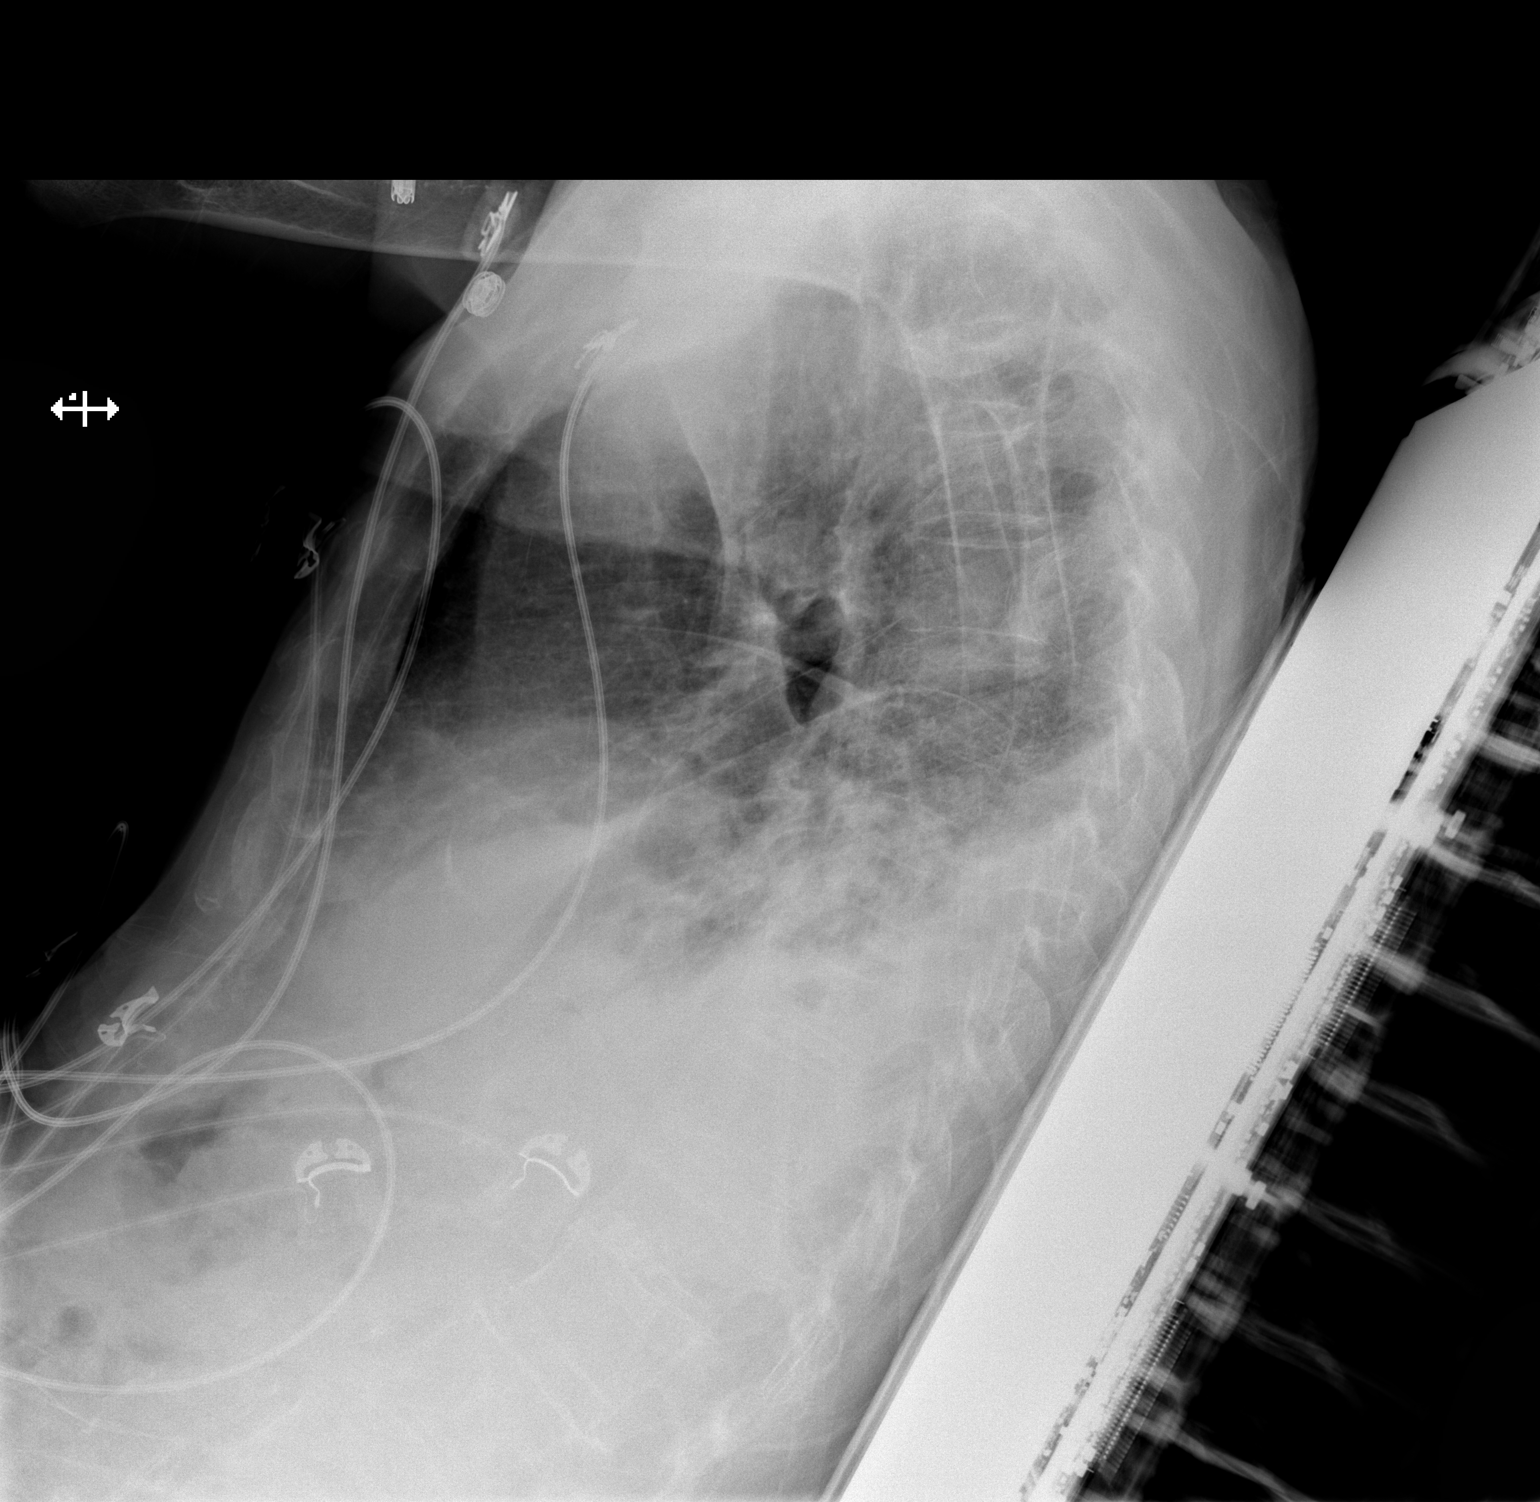

[x chest ap]
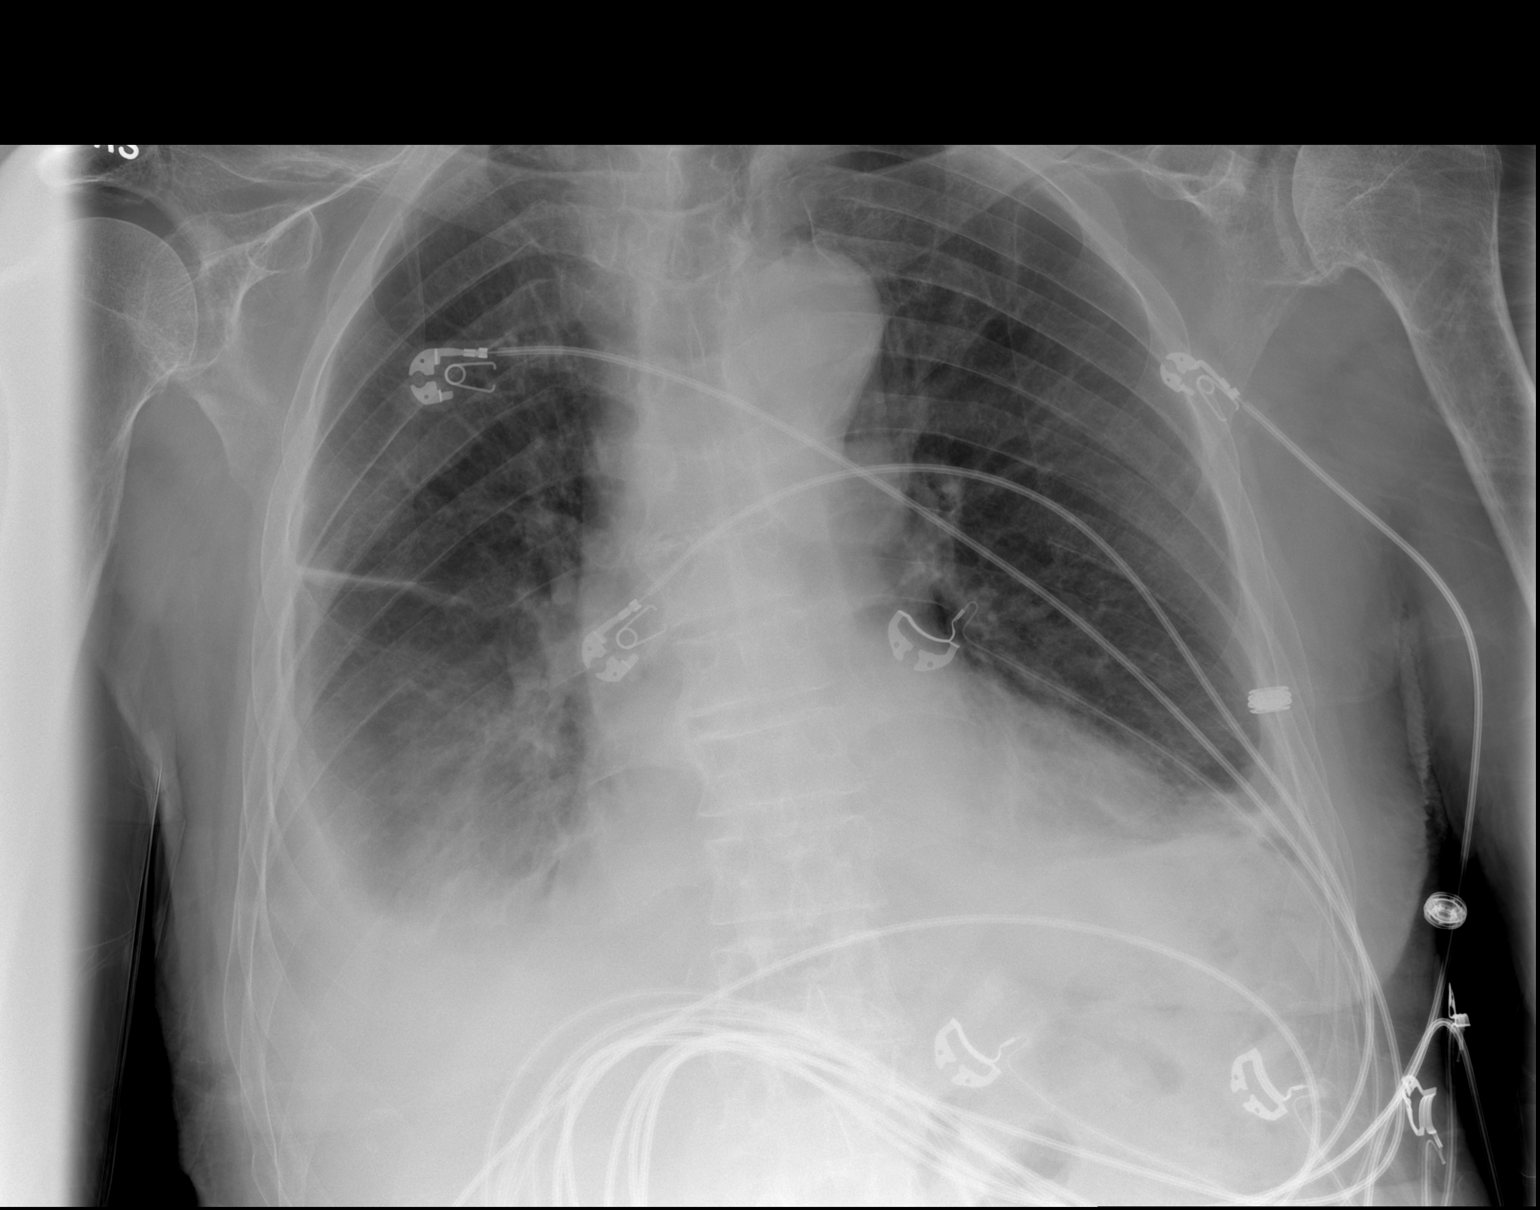

[2 of 2 positions shown; findings below may reference images not displayed]

FINDINGS: The heart size and mediastinal contours are within normal limits.
Normal pulmonary vascularity. Small right greather than left pleural
effusions with associated right greater than left lower lobe
atelectasis. No pneumothorax. No acute osseous abnormality.
IMPRESSION: Right greater than left pleural effusions and bibasilar atelectasis.
